# Patient Record
Sex: Male | Born: 1935 | Race: White | Hispanic: No | Smoking: Former smoker
Health system: Southern US, Community
[De-identification: ages and names within clinical notes are randomized; demographics above are authoritative.]

## PROBLEM LIST (undated history)

## (undated) DIAGNOSIS — D7582 Heparin induced thrombocytopenia (HIT): Secondary | ICD-10-CM

## (undated) DIAGNOSIS — K219 Gastro-esophageal reflux disease without esophagitis: Secondary | ICD-10-CM

## (undated) DIAGNOSIS — R55 Syncope and collapse: Secondary | ICD-10-CM

## (undated) DIAGNOSIS — I714 Abdominal aortic aneurysm, without rupture, unspecified: Secondary | ICD-10-CM

## (undated) DIAGNOSIS — E079 Disorder of thyroid, unspecified: Secondary | ICD-10-CM

## (undated) DIAGNOSIS — K649 Unspecified hemorrhoids: Secondary | ICD-10-CM

## (undated) DIAGNOSIS — I6529 Occlusion and stenosis of unspecified carotid artery: Secondary | ICD-10-CM

## (undated) DIAGNOSIS — I2699 Other pulmonary embolism without acute cor pulmonale: Secondary | ICD-10-CM

## (undated) DIAGNOSIS — D649 Anemia, unspecified: Secondary | ICD-10-CM

## (undated) DIAGNOSIS — E119 Type 2 diabetes mellitus without complications: Secondary | ICD-10-CM

## (undated) DIAGNOSIS — D75829 Heparin-induced thrombocytopenia, unspecified: Secondary | ICD-10-CM

## (undated) DIAGNOSIS — I2510 Atherosclerotic heart disease of native coronary artery without angina pectoris: Secondary | ICD-10-CM

## (undated) DIAGNOSIS — Z9289 Personal history of other medical treatment: Secondary | ICD-10-CM

## (undated) DIAGNOSIS — I5189 Other ill-defined heart diseases: Secondary | ICD-10-CM

## (undated) DIAGNOSIS — N183 Chronic kidney disease, stage 3 unspecified: Secondary | ICD-10-CM

## (undated) DIAGNOSIS — E785 Hyperlipidemia, unspecified: Secondary | ICD-10-CM

## (undated) DIAGNOSIS — I10 Essential (primary) hypertension: Secondary | ICD-10-CM

## (undated) HISTORY — DX: Abdominal aortic aneurysm, without rupture: I71.4

## (undated) HISTORY — DX: Other ill-defined heart diseases: I51.89

## (undated) HISTORY — DX: Personal history of other medical treatment: Z92.89

## (undated) HISTORY — DX: Chronic kidney disease, stage 3 (moderate): N18.3

## (undated) HISTORY — DX: Disorder of thyroid, unspecified: E07.9

## (undated) HISTORY — DX: Hyperlipidemia, unspecified: E78.5

## (undated) HISTORY — PX: TONSILLECTOMY AND ADENOIDECTOMY: SHX28

## (undated) HISTORY — DX: Syncope and collapse: R55

## (undated) HISTORY — DX: Heparin induced thrombocytopenia (HIT): D75.82

## (undated) HISTORY — DX: Gastro-esophageal reflux disease without esophagitis: K21.9

## (undated) HISTORY — DX: Essential (primary) hypertension: I10

## (undated) HISTORY — DX: Atherosclerotic heart disease of native coronary artery without angina pectoris: I25.10

## (undated) HISTORY — DX: Unspecified hemorrhoids: K64.9

## (undated) HISTORY — DX: Abdominal aortic aneurysm, without rupture, unspecified: I71.40

## (undated) HISTORY — DX: Other pulmonary embolism without acute cor pulmonale: I26.99

## (undated) HISTORY — DX: Chronic kidney disease, stage 3 unspecified: N18.30

## (undated) HISTORY — DX: Heparin-induced thrombocytopenia, unspecified: D75.829

## (undated) HISTORY — DX: Anemia, unspecified: D64.9

## (undated) HISTORY — DX: Type 2 diabetes mellitus without complications: E11.9

## (undated) HISTORY — DX: Occlusion and stenosis of unspecified carotid artery: I65.29

---

## 1992-03-04 HISTORY — PX: CORONARY ARTERY BYPASS GRAFT: SHX141

## 2002-03-04 HISTORY — PX: CAROTID ENDARTERECTOMY: SUR193

## 2007-03-05 LAB — HM COLONOSCOPY

## 2015-03-08 DIAGNOSIS — N183 Chronic kidney disease, stage 3 (moderate): Secondary | ICD-10-CM | POA: Diagnosis not present

## 2015-03-08 DIAGNOSIS — D631 Anemia in chronic kidney disease: Secondary | ICD-10-CM | POA: Diagnosis not present

## 2015-03-13 DIAGNOSIS — N184 Chronic kidney disease, stage 4 (severe): Secondary | ICD-10-CM | POA: Diagnosis not present

## 2015-03-13 DIAGNOSIS — D631 Anemia in chronic kidney disease: Secondary | ICD-10-CM | POA: Diagnosis not present

## 2015-03-17 DIAGNOSIS — X32XXXA Exposure to sunlight, initial encounter: Secondary | ICD-10-CM | POA: Diagnosis not present

## 2015-03-17 DIAGNOSIS — L570 Actinic keratosis: Secondary | ICD-10-CM | POA: Diagnosis not present

## 2015-03-17 DIAGNOSIS — D692 Other nonthrombocytopenic purpura: Secondary | ICD-10-CM | POA: Diagnosis not present

## 2015-03-17 DIAGNOSIS — L821 Other seborrheic keratosis: Secondary | ICD-10-CM | POA: Diagnosis not present

## 2015-03-23 DIAGNOSIS — D631 Anemia in chronic kidney disease: Secondary | ICD-10-CM | POA: Diagnosis not present

## 2015-03-23 DIAGNOSIS — N183 Chronic kidney disease, stage 3 (moderate): Secondary | ICD-10-CM | POA: Diagnosis not present

## 2015-03-27 DIAGNOSIS — D631 Anemia in chronic kidney disease: Secondary | ICD-10-CM | POA: Diagnosis not present

## 2015-03-27 DIAGNOSIS — N184 Chronic kidney disease, stage 4 (severe): Secondary | ICD-10-CM | POA: Diagnosis not present

## 2015-04-06 DIAGNOSIS — D631 Anemia in chronic kidney disease: Secondary | ICD-10-CM | POA: Diagnosis not present

## 2015-04-06 DIAGNOSIS — N183 Chronic kidney disease, stage 3 (moderate): Secondary | ICD-10-CM | POA: Diagnosis not present

## 2015-04-10 DIAGNOSIS — D631 Anemia in chronic kidney disease: Secondary | ICD-10-CM | POA: Diagnosis not present

## 2015-04-10 DIAGNOSIS — N184 Chronic kidney disease, stage 4 (severe): Secondary | ICD-10-CM | POA: Diagnosis not present

## 2015-04-19 DIAGNOSIS — D631 Anemia in chronic kidney disease: Secondary | ICD-10-CM | POA: Diagnosis not present

## 2015-04-19 DIAGNOSIS — N183 Chronic kidney disease, stage 3 (moderate): Secondary | ICD-10-CM | POA: Diagnosis not present

## 2015-04-24 DIAGNOSIS — N184 Chronic kidney disease, stage 4 (severe): Secondary | ICD-10-CM | POA: Diagnosis not present

## 2015-04-24 DIAGNOSIS — D631 Anemia in chronic kidney disease: Secondary | ICD-10-CM | POA: Diagnosis not present

## 2015-05-03 DIAGNOSIS — D631 Anemia in chronic kidney disease: Secondary | ICD-10-CM | POA: Diagnosis not present

## 2015-05-03 DIAGNOSIS — N183 Chronic kidney disease, stage 3 (moderate): Secondary | ICD-10-CM | POA: Diagnosis not present

## 2015-05-08 DIAGNOSIS — N184 Chronic kidney disease, stage 4 (severe): Secondary | ICD-10-CM | POA: Diagnosis not present

## 2015-05-08 DIAGNOSIS — D631 Anemia in chronic kidney disease: Secondary | ICD-10-CM | POA: Diagnosis not present

## 2015-05-11 DIAGNOSIS — E118 Type 2 diabetes mellitus with unspecified complications: Secondary | ICD-10-CM | POA: Diagnosis not present

## 2015-05-11 DIAGNOSIS — Z6825 Body mass index (BMI) 25.0-25.9, adult: Secondary | ICD-10-CM | POA: Diagnosis not present

## 2015-05-11 DIAGNOSIS — Z0001 Encounter for general adult medical examination with abnormal findings: Secondary | ICD-10-CM | POA: Diagnosis not present

## 2015-05-17 DIAGNOSIS — D631 Anemia in chronic kidney disease: Secondary | ICD-10-CM | POA: Diagnosis not present

## 2015-05-17 DIAGNOSIS — N183 Chronic kidney disease, stage 3 (moderate): Secondary | ICD-10-CM | POA: Diagnosis not present

## 2015-05-18 DIAGNOSIS — D631 Anemia in chronic kidney disease: Secondary | ICD-10-CM | POA: Diagnosis not present

## 2015-05-18 DIAGNOSIS — I10 Essential (primary) hypertension: Secondary | ICD-10-CM | POA: Diagnosis not present

## 2015-05-18 DIAGNOSIS — E118 Type 2 diabetes mellitus with unspecified complications: Secondary | ICD-10-CM | POA: Diagnosis not present

## 2015-05-18 DIAGNOSIS — E039 Hypothyroidism, unspecified: Secondary | ICD-10-CM | POA: Diagnosis not present

## 2015-05-18 DIAGNOSIS — N183 Chronic kidney disease, stage 3 (moderate): Secondary | ICD-10-CM | POA: Diagnosis not present

## 2015-05-22 DIAGNOSIS — N184 Chronic kidney disease, stage 4 (severe): Secondary | ICD-10-CM | POA: Diagnosis not present

## 2015-05-22 DIAGNOSIS — D631 Anemia in chronic kidney disease: Secondary | ICD-10-CM | POA: Diagnosis not present

## 2015-05-31 DIAGNOSIS — D631 Anemia in chronic kidney disease: Secondary | ICD-10-CM | POA: Diagnosis not present

## 2015-05-31 DIAGNOSIS — N183 Chronic kidney disease, stage 3 (moderate): Secondary | ICD-10-CM | POA: Diagnosis not present

## 2015-06-05 DIAGNOSIS — N184 Chronic kidney disease, stage 4 (severe): Secondary | ICD-10-CM | POA: Diagnosis not present

## 2015-06-05 DIAGNOSIS — D631 Anemia in chronic kidney disease: Secondary | ICD-10-CM | POA: Diagnosis not present

## 2015-06-14 DIAGNOSIS — N183 Chronic kidney disease, stage 3 (moderate): Secondary | ICD-10-CM | POA: Diagnosis not present

## 2015-06-14 DIAGNOSIS — D631 Anemia in chronic kidney disease: Secondary | ICD-10-CM | POA: Diagnosis not present

## 2015-06-19 DIAGNOSIS — D631 Anemia in chronic kidney disease: Secondary | ICD-10-CM | POA: Diagnosis not present

## 2015-06-19 DIAGNOSIS — N184 Chronic kidney disease, stage 4 (severe): Secondary | ICD-10-CM | POA: Diagnosis not present

## 2015-06-28 DIAGNOSIS — D631 Anemia in chronic kidney disease: Secondary | ICD-10-CM | POA: Diagnosis not present

## 2015-06-28 DIAGNOSIS — N183 Chronic kidney disease, stage 3 (moderate): Secondary | ICD-10-CM | POA: Diagnosis not present

## 2015-07-12 DIAGNOSIS — N183 Chronic kidney disease, stage 3 (moderate): Secondary | ICD-10-CM | POA: Diagnosis not present

## 2015-07-12 DIAGNOSIS — D631 Anemia in chronic kidney disease: Secondary | ICD-10-CM | POA: Diagnosis not present

## 2015-07-17 DIAGNOSIS — D631 Anemia in chronic kidney disease: Secondary | ICD-10-CM | POA: Diagnosis not present

## 2015-07-17 DIAGNOSIS — N184 Chronic kidney disease, stage 4 (severe): Secondary | ICD-10-CM | POA: Diagnosis not present

## 2015-08-01 DIAGNOSIS — N184 Chronic kidney disease, stage 4 (severe): Secondary | ICD-10-CM | POA: Diagnosis not present

## 2015-08-01 DIAGNOSIS — E118 Type 2 diabetes mellitus with unspecified complications: Secondary | ICD-10-CM | POA: Diagnosis not present

## 2015-08-01 DIAGNOSIS — E875 Hyperkalemia: Secondary | ICD-10-CM | POA: Diagnosis not present

## 2015-08-01 DIAGNOSIS — E785 Hyperlipidemia, unspecified: Secondary | ICD-10-CM | POA: Diagnosis not present

## 2015-08-01 DIAGNOSIS — N2581 Secondary hyperparathyroidism of renal origin: Secondary | ICD-10-CM | POA: Diagnosis not present

## 2015-08-01 DIAGNOSIS — E119 Type 2 diabetes mellitus without complications: Secondary | ICD-10-CM | POA: Diagnosis not present

## 2015-08-01 DIAGNOSIS — N183 Chronic kidney disease, stage 3 (moderate): Secondary | ICD-10-CM | POA: Diagnosis not present

## 2015-08-01 DIAGNOSIS — I10 Essential (primary) hypertension: Secondary | ICD-10-CM | POA: Diagnosis not present

## 2015-08-01 DIAGNOSIS — D631 Anemia in chronic kidney disease: Secondary | ICD-10-CM | POA: Diagnosis not present

## 2015-08-07 DIAGNOSIS — N2581 Secondary hyperparathyroidism of renal origin: Secondary | ICD-10-CM | POA: Diagnosis not present

## 2015-08-07 DIAGNOSIS — N184 Chronic kidney disease, stage 4 (severe): Secondary | ICD-10-CM | POA: Diagnosis not present

## 2015-08-07 DIAGNOSIS — I10 Essential (primary) hypertension: Secondary | ICD-10-CM | POA: Diagnosis not present

## 2015-08-07 DIAGNOSIS — D631 Anemia in chronic kidney disease: Secondary | ICD-10-CM | POA: Diagnosis not present

## 2015-08-16 DIAGNOSIS — N183 Chronic kidney disease, stage 3 (moderate): Secondary | ICD-10-CM | POA: Diagnosis not present

## 2015-08-16 DIAGNOSIS — D631 Anemia in chronic kidney disease: Secondary | ICD-10-CM | POA: Diagnosis not present

## 2015-08-18 DIAGNOSIS — E1169 Type 2 diabetes mellitus with other specified complication: Secondary | ICD-10-CM | POA: Diagnosis not present

## 2015-08-18 DIAGNOSIS — Z6825 Body mass index (BMI) 25.0-25.9, adult: Secondary | ICD-10-CM | POA: Diagnosis not present

## 2015-08-18 DIAGNOSIS — I10 Essential (primary) hypertension: Secondary | ICD-10-CM | POA: Diagnosis not present

## 2015-08-18 DIAGNOSIS — N183 Chronic kidney disease, stage 3 (moderate): Secondary | ICD-10-CM | POA: Diagnosis not present

## 2015-08-21 DIAGNOSIS — D631 Anemia in chronic kidney disease: Secondary | ICD-10-CM | POA: Diagnosis not present

## 2015-08-21 DIAGNOSIS — N184 Chronic kidney disease, stage 4 (severe): Secondary | ICD-10-CM | POA: Diagnosis not present

## 2015-08-29 DIAGNOSIS — N183 Chronic kidney disease, stage 3 (moderate): Secondary | ICD-10-CM | POA: Diagnosis not present

## 2015-08-29 DIAGNOSIS — D631 Anemia in chronic kidney disease: Secondary | ICD-10-CM | POA: Diagnosis not present

## 2015-08-30 DIAGNOSIS — D631 Anemia in chronic kidney disease: Secondary | ICD-10-CM | POA: Diagnosis not present

## 2015-08-30 DIAGNOSIS — N184 Chronic kidney disease, stage 4 (severe): Secondary | ICD-10-CM | POA: Diagnosis not present

## 2015-08-31 DIAGNOSIS — I259 Chronic ischemic heart disease, unspecified: Secondary | ICD-10-CM | POA: Diagnosis not present

## 2015-08-31 DIAGNOSIS — E782 Mixed hyperlipidemia: Secondary | ICD-10-CM | POA: Diagnosis not present

## 2015-08-31 DIAGNOSIS — R0989 Other specified symptoms and signs involving the circulatory and respiratory systems: Secondary | ICD-10-CM | POA: Diagnosis not present

## 2015-09-11 DIAGNOSIS — D631 Anemia in chronic kidney disease: Secondary | ICD-10-CM | POA: Diagnosis not present

## 2015-09-11 DIAGNOSIS — N183 Chronic kidney disease, stage 3 (moderate): Secondary | ICD-10-CM | POA: Diagnosis not present

## 2015-09-13 DIAGNOSIS — N184 Chronic kidney disease, stage 4 (severe): Secondary | ICD-10-CM | POA: Diagnosis not present

## 2015-09-13 DIAGNOSIS — D631 Anemia in chronic kidney disease: Secondary | ICD-10-CM | POA: Diagnosis not present

## 2015-09-14 DIAGNOSIS — I270 Primary pulmonary hypertension: Secondary | ICD-10-CM | POA: Diagnosis not present

## 2015-09-14 DIAGNOSIS — R0989 Other specified symptoms and signs involving the circulatory and respiratory systems: Secondary | ICD-10-CM | POA: Diagnosis not present

## 2015-09-14 DIAGNOSIS — I255 Ischemic cardiomyopathy: Secondary | ICD-10-CM | POA: Diagnosis not present

## 2015-09-20 DIAGNOSIS — D631 Anemia in chronic kidney disease: Secondary | ICD-10-CM | POA: Diagnosis not present

## 2015-09-20 DIAGNOSIS — N183 Chronic kidney disease, stage 3 (moderate): Secondary | ICD-10-CM | POA: Diagnosis not present

## 2015-09-25 DIAGNOSIS — N184 Chronic kidney disease, stage 4 (severe): Secondary | ICD-10-CM | POA: Diagnosis not present

## 2015-09-25 DIAGNOSIS — D631 Anemia in chronic kidney disease: Secondary | ICD-10-CM | POA: Diagnosis not present

## 2015-10-05 DIAGNOSIS — N183 Chronic kidney disease, stage 3 (moderate): Secondary | ICD-10-CM | POA: Diagnosis not present

## 2015-10-05 DIAGNOSIS — D631 Anemia in chronic kidney disease: Secondary | ICD-10-CM | POA: Diagnosis not present

## 2015-10-10 DIAGNOSIS — D631 Anemia in chronic kidney disease: Secondary | ICD-10-CM | POA: Diagnosis not present

## 2015-10-10 DIAGNOSIS — N184 Chronic kidney disease, stage 4 (severe): Secondary | ICD-10-CM | POA: Diagnosis not present

## 2015-10-18 DIAGNOSIS — N183 Chronic kidney disease, stage 3 (moderate): Secondary | ICD-10-CM | POA: Diagnosis not present

## 2015-10-18 DIAGNOSIS — D631 Anemia in chronic kidney disease: Secondary | ICD-10-CM | POA: Diagnosis not present

## 2015-10-24 DIAGNOSIS — N184 Chronic kidney disease, stage 4 (severe): Secondary | ICD-10-CM | POA: Diagnosis not present

## 2015-10-24 DIAGNOSIS — D631 Anemia in chronic kidney disease: Secondary | ICD-10-CM | POA: Diagnosis not present

## 2015-11-09 DIAGNOSIS — D631 Anemia in chronic kidney disease: Secondary | ICD-10-CM | POA: Diagnosis not present

## 2015-11-09 DIAGNOSIS — N183 Chronic kidney disease, stage 3 (moderate): Secondary | ICD-10-CM | POA: Diagnosis not present

## 2015-11-21 DIAGNOSIS — Z6826 Body mass index (BMI) 26.0-26.9, adult: Secondary | ICD-10-CM | POA: Diagnosis not present

## 2015-11-21 DIAGNOSIS — E1169 Type 2 diabetes mellitus with other specified complication: Secondary | ICD-10-CM | POA: Diagnosis not present

## 2015-11-21 DIAGNOSIS — D631 Anemia in chronic kidney disease: Secondary | ICD-10-CM | POA: Diagnosis not present

## 2015-11-21 DIAGNOSIS — I714 Abdominal aortic aneurysm, without rupture: Secondary | ICD-10-CM | POA: Diagnosis not present

## 2015-11-21 DIAGNOSIS — Z23 Encounter for immunization: Secondary | ICD-10-CM | POA: Diagnosis not present

## 2015-11-21 DIAGNOSIS — N183 Chronic kidney disease, stage 3 (moderate): Secondary | ICD-10-CM | POA: Diagnosis not present

## 2015-11-23 DIAGNOSIS — D631 Anemia in chronic kidney disease: Secondary | ICD-10-CM | POA: Diagnosis not present

## 2015-11-23 DIAGNOSIS — N183 Chronic kidney disease, stage 3 (moderate): Secondary | ICD-10-CM | POA: Diagnosis not present

## 2015-11-28 DIAGNOSIS — D631 Anemia in chronic kidney disease: Secondary | ICD-10-CM | POA: Diagnosis not present

## 2015-11-28 DIAGNOSIS — N183 Chronic kidney disease, stage 3 (moderate): Secondary | ICD-10-CM | POA: Diagnosis not present

## 2015-11-30 DIAGNOSIS — E119 Type 2 diabetes mellitus without complications: Secondary | ICD-10-CM | POA: Diagnosis not present

## 2015-11-30 DIAGNOSIS — N2581 Secondary hyperparathyroidism of renal origin: Secondary | ICD-10-CM | POA: Diagnosis not present

## 2015-11-30 DIAGNOSIS — N183 Chronic kidney disease, stage 3 (moderate): Secondary | ICD-10-CM | POA: Diagnosis not present

## 2015-11-30 DIAGNOSIS — I10 Essential (primary) hypertension: Secondary | ICD-10-CM | POA: Diagnosis not present

## 2015-12-04 DIAGNOSIS — Z961 Presence of intraocular lens: Secondary | ICD-10-CM | POA: Diagnosis not present

## 2015-12-04 DIAGNOSIS — H26493 Other secondary cataract, bilateral: Secondary | ICD-10-CM | POA: Diagnosis not present

## 2015-12-04 DIAGNOSIS — I10 Essential (primary) hypertension: Secondary | ICD-10-CM | POA: Diagnosis not present

## 2015-12-04 DIAGNOSIS — D631 Anemia in chronic kidney disease: Secondary | ICD-10-CM | POA: Diagnosis not present

## 2015-12-04 DIAGNOSIS — H40052 Ocular hypertension, left eye: Secondary | ICD-10-CM | POA: Diagnosis not present

## 2015-12-04 DIAGNOSIS — E119 Type 2 diabetes mellitus without complications: Secondary | ICD-10-CM | POA: Diagnosis not present

## 2015-12-04 DIAGNOSIS — N2581 Secondary hyperparathyroidism of renal origin: Secondary | ICD-10-CM | POA: Diagnosis not present

## 2015-12-04 DIAGNOSIS — N184 Chronic kidney disease, stage 4 (severe): Secondary | ICD-10-CM | POA: Diagnosis not present

## 2015-12-12 DIAGNOSIS — N184 Chronic kidney disease, stage 4 (severe): Secondary | ICD-10-CM | POA: Diagnosis not present

## 2015-12-12 DIAGNOSIS — D631 Anemia in chronic kidney disease: Secondary | ICD-10-CM | POA: Diagnosis not present

## 2015-12-20 DIAGNOSIS — D631 Anemia in chronic kidney disease: Secondary | ICD-10-CM | POA: Diagnosis not present

## 2015-12-20 DIAGNOSIS — N183 Chronic kidney disease, stage 3 (moderate): Secondary | ICD-10-CM | POA: Diagnosis not present

## 2015-12-21 DIAGNOSIS — H40052 Ocular hypertension, left eye: Secondary | ICD-10-CM | POA: Diagnosis not present

## 2015-12-27 DIAGNOSIS — N183 Chronic kidney disease, stage 3 (moderate): Secondary | ICD-10-CM | POA: Diagnosis not present

## 2015-12-27 DIAGNOSIS — D631 Anemia in chronic kidney disease: Secondary | ICD-10-CM | POA: Diagnosis not present

## 2016-01-03 DIAGNOSIS — N183 Chronic kidney disease, stage 3 (moderate): Secondary | ICD-10-CM | POA: Diagnosis not present

## 2016-01-03 DIAGNOSIS — D631 Anemia in chronic kidney disease: Secondary | ICD-10-CM | POA: Diagnosis not present

## 2016-01-04 DIAGNOSIS — H903 Sensorineural hearing loss, bilateral: Secondary | ICD-10-CM | POA: Diagnosis not present

## 2016-01-08 DIAGNOSIS — Z6826 Body mass index (BMI) 26.0-26.9, adult: Secondary | ICD-10-CM | POA: Diagnosis not present

## 2016-01-08 DIAGNOSIS — E039 Hypothyroidism, unspecified: Secondary | ICD-10-CM | POA: Diagnosis not present

## 2016-01-08 DIAGNOSIS — I2510 Atherosclerotic heart disease of native coronary artery without angina pectoris: Secondary | ICD-10-CM | POA: Diagnosis not present

## 2016-01-08 DIAGNOSIS — M25562 Pain in left knee: Secondary | ICD-10-CM | POA: Diagnosis not present

## 2016-01-08 DIAGNOSIS — E1169 Type 2 diabetes mellitus with other specified complication: Secondary | ICD-10-CM | POA: Diagnosis not present

## 2016-01-10 DIAGNOSIS — N183 Chronic kidney disease, stage 3 (moderate): Secondary | ICD-10-CM | POA: Diagnosis not present

## 2016-01-10 DIAGNOSIS — D631 Anemia in chronic kidney disease: Secondary | ICD-10-CM | POA: Diagnosis not present

## 2016-02-02 DIAGNOSIS — D631 Anemia in chronic kidney disease: Secondary | ICD-10-CM | POA: Diagnosis not present

## 2016-02-02 DIAGNOSIS — N183 Chronic kidney disease, stage 3 (moderate): Secondary | ICD-10-CM | POA: Diagnosis not present

## 2016-02-06 DIAGNOSIS — N183 Chronic kidney disease, stage 3 (moderate): Secondary | ICD-10-CM | POA: Diagnosis not present

## 2016-02-06 DIAGNOSIS — D631 Anemia in chronic kidney disease: Secondary | ICD-10-CM | POA: Diagnosis not present

## 2016-02-12 DIAGNOSIS — Z86711 Personal history of pulmonary embolism: Secondary | ICD-10-CM | POA: Diagnosis not present

## 2016-02-12 DIAGNOSIS — R0989 Other specified symptoms and signs involving the circulatory and respiratory systems: Secondary | ICD-10-CM | POA: Diagnosis not present

## 2016-02-12 DIAGNOSIS — N2581 Secondary hyperparathyroidism of renal origin: Secondary | ICD-10-CM | POA: Diagnosis not present

## 2016-02-12 DIAGNOSIS — I2510 Atherosclerotic heart disease of native coronary artery without angina pectoris: Secondary | ICD-10-CM | POA: Diagnosis not present

## 2016-02-12 DIAGNOSIS — Z888 Allergy status to other drugs, medicaments and biological substances status: Secondary | ICD-10-CM | POA: Diagnosis not present

## 2016-02-12 DIAGNOSIS — N179 Acute kidney failure, unspecified: Secondary | ICD-10-CM | POA: Diagnosis not present

## 2016-02-12 DIAGNOSIS — N183 Chronic kidney disease, stage 3 (moderate): Secondary | ICD-10-CM | POA: Diagnosis not present

## 2016-02-12 DIAGNOSIS — I82411 Acute embolism and thrombosis of right femoral vein: Secondary | ICD-10-CM | POA: Diagnosis not present

## 2016-02-12 DIAGNOSIS — E782 Mixed hyperlipidemia: Secondary | ICD-10-CM | POA: Diagnosis not present

## 2016-02-12 DIAGNOSIS — Z951 Presence of aortocoronary bypass graft: Secondary | ICD-10-CM | POA: Diagnosis not present

## 2016-02-12 DIAGNOSIS — Z87891 Personal history of nicotine dependence: Secondary | ICD-10-CM | POA: Diagnosis not present

## 2016-02-12 DIAGNOSIS — I1310 Hypertensive heart and chronic kidney disease without heart failure, with stage 1 through stage 4 chronic kidney disease, or unspecified chronic kidney disease: Secondary | ICD-10-CM | POA: Diagnosis not present

## 2016-02-12 DIAGNOSIS — I509 Heart failure, unspecified: Secondary | ICD-10-CM | POA: Diagnosis not present

## 2016-02-12 DIAGNOSIS — Z0184 Encounter for antibody response examination: Secondary | ICD-10-CM | POA: Diagnosis not present

## 2016-02-12 DIAGNOSIS — D649 Anemia, unspecified: Secondary | ICD-10-CM | POA: Diagnosis not present

## 2016-02-12 DIAGNOSIS — E1122 Type 2 diabetes mellitus with diabetic chronic kidney disease: Secondary | ICD-10-CM | POA: Diagnosis not present

## 2016-02-12 DIAGNOSIS — E785 Hyperlipidemia, unspecified: Secondary | ICD-10-CM | POA: Diagnosis present

## 2016-02-12 DIAGNOSIS — E8770 Fluid overload, unspecified: Secondary | ICD-10-CM | POA: Diagnosis not present

## 2016-02-12 DIAGNOSIS — I259 Chronic ischemic heart disease, unspecified: Secondary | ICD-10-CM | POA: Diagnosis not present

## 2016-02-12 DIAGNOSIS — N189 Chronic kidney disease, unspecified: Secondary | ICD-10-CM | POA: Diagnosis not present

## 2016-02-12 DIAGNOSIS — I214 Non-ST elevation (NSTEMI) myocardial infarction: Secondary | ICD-10-CM | POA: Diagnosis not present

## 2016-02-12 DIAGNOSIS — R079 Chest pain, unspecified: Secondary | ICD-10-CM | POA: Diagnosis not present

## 2016-02-12 DIAGNOSIS — D631 Anemia in chronic kidney disease: Secondary | ICD-10-CM | POA: Diagnosis not present

## 2016-02-19 DIAGNOSIS — I25709 Atherosclerosis of coronary artery bypass graft(s), unspecified, with unspecified angina pectoris: Secondary | ICD-10-CM | POA: Diagnosis not present

## 2016-02-19 DIAGNOSIS — E1169 Type 2 diabetes mellitus with other specified complication: Secondary | ICD-10-CM | POA: Diagnosis not present

## 2016-02-19 DIAGNOSIS — E039 Hypothyroidism, unspecified: Secondary | ICD-10-CM | POA: Diagnosis not present

## 2016-02-19 DIAGNOSIS — I10 Essential (primary) hypertension: Secondary | ICD-10-CM | POA: Diagnosis not present

## 2016-02-20 DIAGNOSIS — I10 Essential (primary) hypertension: Secondary | ICD-10-CM | POA: Diagnosis not present

## 2016-02-20 DIAGNOSIS — N183 Chronic kidney disease, stage 3 (moderate): Secondary | ICD-10-CM | POA: Diagnosis not present

## 2016-02-20 DIAGNOSIS — N2581 Secondary hyperparathyroidism of renal origin: Secondary | ICD-10-CM | POA: Diagnosis not present

## 2016-02-20 DIAGNOSIS — D631 Anemia in chronic kidney disease: Secondary | ICD-10-CM | POA: Diagnosis not present

## 2016-02-20 DIAGNOSIS — N184 Chronic kidney disease, stage 4 (severe): Secondary | ICD-10-CM | POA: Diagnosis not present

## 2016-03-08 DIAGNOSIS — R079 Chest pain, unspecified: Secondary | ICD-10-CM | POA: Diagnosis not present

## 2016-03-08 DIAGNOSIS — R5383 Other fatigue: Secondary | ICD-10-CM | POA: Diagnosis not present

## 2016-03-08 DIAGNOSIS — I259 Chronic ischemic heart disease, unspecified: Secondary | ICD-10-CM | POA: Diagnosis not present

## 2016-03-08 DIAGNOSIS — I25118 Atherosclerotic heart disease of native coronary artery with other forms of angina pectoris: Secondary | ICD-10-CM | POA: Diagnosis not present

## 2016-03-08 DIAGNOSIS — R0609 Other forms of dyspnea: Secondary | ICD-10-CM | POA: Diagnosis not present

## 2016-03-08 DIAGNOSIS — I209 Angina pectoris, unspecified: Secondary | ICD-10-CM | POA: Diagnosis not present

## 2016-03-08 DIAGNOSIS — I2582 Chronic total occlusion of coronary artery: Secondary | ICD-10-CM | POA: Diagnosis not present

## 2016-03-08 DIAGNOSIS — E782 Mixed hyperlipidemia: Secondary | ICD-10-CM | POA: Diagnosis not present

## 2016-03-08 DIAGNOSIS — I214 Non-ST elevation (NSTEMI) myocardial infarction: Secondary | ICD-10-CM | POA: Diagnosis not present

## 2016-03-15 DIAGNOSIS — L570 Actinic keratosis: Secondary | ICD-10-CM | POA: Diagnosis not present

## 2016-03-15 DIAGNOSIS — X32XXXA Exposure to sunlight, initial encounter: Secondary | ICD-10-CM | POA: Diagnosis not present

## 2016-03-19 DIAGNOSIS — N189 Chronic kidney disease, unspecified: Secondary | ICD-10-CM | POA: Diagnosis not present

## 2016-03-19 DIAGNOSIS — N183 Chronic kidney disease, stage 3 (moderate): Secondary | ICD-10-CM | POA: Diagnosis not present

## 2016-03-19 DIAGNOSIS — R609 Edema, unspecified: Secondary | ICD-10-CM | POA: Diagnosis not present

## 2016-03-19 DIAGNOSIS — I259 Chronic ischemic heart disease, unspecified: Secondary | ICD-10-CM | POA: Diagnosis not present

## 2016-03-19 DIAGNOSIS — I82401 Acute embolism and thrombosis of unspecified deep veins of right lower extremity: Secondary | ICD-10-CM | POA: Diagnosis not present

## 2016-03-19 DIAGNOSIS — E782 Mixed hyperlipidemia: Secondary | ICD-10-CM | POA: Diagnosis not present

## 2016-03-20 DIAGNOSIS — E1169 Type 2 diabetes mellitus with other specified complication: Secondary | ICD-10-CM | POA: Diagnosis not present

## 2016-03-20 DIAGNOSIS — D631 Anemia in chronic kidney disease: Secondary | ICD-10-CM | POA: Diagnosis not present

## 2016-03-20 DIAGNOSIS — Z951 Presence of aortocoronary bypass graft: Secondary | ICD-10-CM | POA: Diagnosis not present

## 2016-03-20 DIAGNOSIS — N183 Chronic kidney disease, stage 3 (moderate): Secondary | ICD-10-CM | POA: Diagnosis not present

## 2016-03-21 DIAGNOSIS — R079 Chest pain, unspecified: Secondary | ICD-10-CM | POA: Diagnosis not present

## 2016-03-21 DIAGNOSIS — R5383 Other fatigue: Secondary | ICD-10-CM | POA: Diagnosis not present

## 2016-03-21 DIAGNOSIS — I209 Angina pectoris, unspecified: Secondary | ICD-10-CM | POA: Diagnosis not present

## 2016-03-21 DIAGNOSIS — I2582 Chronic total occlusion of coronary artery: Secondary | ICD-10-CM | POA: Diagnosis not present

## 2016-03-21 DIAGNOSIS — I25118 Atherosclerotic heart disease of native coronary artery with other forms of angina pectoris: Secondary | ICD-10-CM | POA: Diagnosis not present

## 2016-03-21 DIAGNOSIS — I214 Non-ST elevation (NSTEMI) myocardial infarction: Secondary | ICD-10-CM | POA: Diagnosis not present

## 2016-03-21 DIAGNOSIS — R0609 Other forms of dyspnea: Secondary | ICD-10-CM | POA: Diagnosis not present

## 2016-03-25 DIAGNOSIS — I25118 Atherosclerotic heart disease of native coronary artery with other forms of angina pectoris: Secondary | ICD-10-CM | POA: Diagnosis not present

## 2016-03-25 DIAGNOSIS — R0609 Other forms of dyspnea: Secondary | ICD-10-CM | POA: Diagnosis not present

## 2016-03-25 DIAGNOSIS — I214 Non-ST elevation (NSTEMI) myocardial infarction: Secondary | ICD-10-CM | POA: Diagnosis not present

## 2016-03-25 DIAGNOSIS — R5383 Other fatigue: Secondary | ICD-10-CM | POA: Diagnosis not present

## 2016-03-25 DIAGNOSIS — R079 Chest pain, unspecified: Secondary | ICD-10-CM | POA: Diagnosis not present

## 2016-03-25 DIAGNOSIS — I2582 Chronic total occlusion of coronary artery: Secondary | ICD-10-CM | POA: Diagnosis not present

## 2016-03-25 DIAGNOSIS — I209 Angina pectoris, unspecified: Secondary | ICD-10-CM | POA: Diagnosis not present

## 2016-03-26 DIAGNOSIS — I2582 Chronic total occlusion of coronary artery: Secondary | ICD-10-CM | POA: Diagnosis not present

## 2016-03-26 DIAGNOSIS — I25118 Atherosclerotic heart disease of native coronary artery with other forms of angina pectoris: Secondary | ICD-10-CM | POA: Diagnosis not present

## 2016-03-26 DIAGNOSIS — I214 Non-ST elevation (NSTEMI) myocardial infarction: Secondary | ICD-10-CM | POA: Diagnosis not present

## 2016-03-26 DIAGNOSIS — R5383 Other fatigue: Secondary | ICD-10-CM | POA: Diagnosis not present

## 2016-03-26 DIAGNOSIS — R079 Chest pain, unspecified: Secondary | ICD-10-CM | POA: Diagnosis not present

## 2016-03-26 DIAGNOSIS — R0609 Other forms of dyspnea: Secondary | ICD-10-CM | POA: Diagnosis not present

## 2016-03-26 DIAGNOSIS — I209 Angina pectoris, unspecified: Secondary | ICD-10-CM | POA: Diagnosis not present

## 2016-03-27 DIAGNOSIS — I214 Non-ST elevation (NSTEMI) myocardial infarction: Secondary | ICD-10-CM | POA: Diagnosis not present

## 2016-03-27 DIAGNOSIS — I2582 Chronic total occlusion of coronary artery: Secondary | ICD-10-CM | POA: Diagnosis not present

## 2016-03-27 DIAGNOSIS — R5383 Other fatigue: Secondary | ICD-10-CM | POA: Diagnosis not present

## 2016-03-27 DIAGNOSIS — R0609 Other forms of dyspnea: Secondary | ICD-10-CM | POA: Diagnosis not present

## 2016-03-27 DIAGNOSIS — I209 Angina pectoris, unspecified: Secondary | ICD-10-CM | POA: Diagnosis not present

## 2016-03-27 DIAGNOSIS — I25118 Atherosclerotic heart disease of native coronary artery with other forms of angina pectoris: Secondary | ICD-10-CM | POA: Diagnosis not present

## 2016-03-27 DIAGNOSIS — R079 Chest pain, unspecified: Secondary | ICD-10-CM | POA: Diagnosis not present

## 2016-03-28 DIAGNOSIS — E1169 Type 2 diabetes mellitus with other specified complication: Secondary | ICD-10-CM | POA: Diagnosis not present

## 2016-03-28 DIAGNOSIS — E119 Type 2 diabetes mellitus without complications: Secondary | ICD-10-CM | POA: Diagnosis not present

## 2016-03-28 DIAGNOSIS — D631 Anemia in chronic kidney disease: Secondary | ICD-10-CM | POA: Diagnosis not present

## 2016-03-28 DIAGNOSIS — I25118 Atherosclerotic heart disease of native coronary artery with other forms of angina pectoris: Secondary | ICD-10-CM | POA: Diagnosis not present

## 2016-03-28 DIAGNOSIS — I214 Non-ST elevation (NSTEMI) myocardial infarction: Secondary | ICD-10-CM | POA: Diagnosis not present

## 2016-03-28 DIAGNOSIS — R079 Chest pain, unspecified: Secondary | ICD-10-CM | POA: Diagnosis not present

## 2016-03-28 DIAGNOSIS — I10 Essential (primary) hypertension: Secondary | ICD-10-CM | POA: Diagnosis not present

## 2016-03-28 DIAGNOSIS — I209 Angina pectoris, unspecified: Secondary | ICD-10-CM | POA: Diagnosis not present

## 2016-03-28 DIAGNOSIS — N183 Chronic kidney disease, stage 3 (moderate): Secondary | ICD-10-CM | POA: Diagnosis not present

## 2016-03-28 DIAGNOSIS — N2581 Secondary hyperparathyroidism of renal origin: Secondary | ICD-10-CM | POA: Diagnosis not present

## 2016-03-28 DIAGNOSIS — I2582 Chronic total occlusion of coronary artery: Secondary | ICD-10-CM | POA: Diagnosis not present

## 2016-03-28 DIAGNOSIS — R0609 Other forms of dyspnea: Secondary | ICD-10-CM | POA: Diagnosis not present

## 2016-03-28 DIAGNOSIS — R5383 Other fatigue: Secondary | ICD-10-CM | POA: Diagnosis not present

## 2016-03-28 DIAGNOSIS — E785 Hyperlipidemia, unspecified: Secondary | ICD-10-CM | POA: Diagnosis not present

## 2016-03-28 DIAGNOSIS — E875 Hyperkalemia: Secondary | ICD-10-CM | POA: Diagnosis not present

## 2016-03-29 DIAGNOSIS — I25118 Atherosclerotic heart disease of native coronary artery with other forms of angina pectoris: Secondary | ICD-10-CM | POA: Diagnosis not present

## 2016-03-29 DIAGNOSIS — R079 Chest pain, unspecified: Secondary | ICD-10-CM | POA: Diagnosis not present

## 2016-03-29 DIAGNOSIS — R0609 Other forms of dyspnea: Secondary | ICD-10-CM | POA: Diagnosis not present

## 2016-03-29 DIAGNOSIS — I209 Angina pectoris, unspecified: Secondary | ICD-10-CM | POA: Diagnosis not present

## 2016-03-29 DIAGNOSIS — I2582 Chronic total occlusion of coronary artery: Secondary | ICD-10-CM | POA: Diagnosis not present

## 2016-03-29 DIAGNOSIS — R5383 Other fatigue: Secondary | ICD-10-CM | POA: Diagnosis not present

## 2016-03-29 DIAGNOSIS — I214 Non-ST elevation (NSTEMI) myocardial infarction: Secondary | ICD-10-CM | POA: Diagnosis not present

## 2016-04-02 DIAGNOSIS — I2582 Chronic total occlusion of coronary artery: Secondary | ICD-10-CM | POA: Diagnosis not present

## 2016-04-02 DIAGNOSIS — I10 Essential (primary) hypertension: Secondary | ICD-10-CM | POA: Diagnosis not present

## 2016-04-02 DIAGNOSIS — R079 Chest pain, unspecified: Secondary | ICD-10-CM | POA: Diagnosis not present

## 2016-04-02 DIAGNOSIS — R5383 Other fatigue: Secondary | ICD-10-CM | POA: Diagnosis not present

## 2016-04-02 DIAGNOSIS — N2581 Secondary hyperparathyroidism of renal origin: Secondary | ICD-10-CM | POA: Diagnosis not present

## 2016-04-02 DIAGNOSIS — I214 Non-ST elevation (NSTEMI) myocardial infarction: Secondary | ICD-10-CM | POA: Diagnosis not present

## 2016-04-02 DIAGNOSIS — I25118 Atherosclerotic heart disease of native coronary artery with other forms of angina pectoris: Secondary | ICD-10-CM | POA: Diagnosis not present

## 2016-04-02 DIAGNOSIS — N184 Chronic kidney disease, stage 4 (severe): Secondary | ICD-10-CM | POA: Diagnosis not present

## 2016-04-02 DIAGNOSIS — D631 Anemia in chronic kidney disease: Secondary | ICD-10-CM | POA: Diagnosis not present

## 2016-04-02 DIAGNOSIS — I209 Angina pectoris, unspecified: Secondary | ICD-10-CM | POA: Diagnosis not present

## 2016-04-02 DIAGNOSIS — R0609 Other forms of dyspnea: Secondary | ICD-10-CM | POA: Diagnosis not present

## 2016-04-03 DIAGNOSIS — I25118 Atherosclerotic heart disease of native coronary artery with other forms of angina pectoris: Secondary | ICD-10-CM | POA: Diagnosis not present

## 2016-04-03 DIAGNOSIS — I2582 Chronic total occlusion of coronary artery: Secondary | ICD-10-CM | POA: Diagnosis not present

## 2016-04-03 DIAGNOSIS — R0609 Other forms of dyspnea: Secondary | ICD-10-CM | POA: Diagnosis not present

## 2016-04-03 DIAGNOSIS — R079 Chest pain, unspecified: Secondary | ICD-10-CM | POA: Diagnosis not present

## 2016-04-03 DIAGNOSIS — I214 Non-ST elevation (NSTEMI) myocardial infarction: Secondary | ICD-10-CM | POA: Diagnosis not present

## 2016-04-03 DIAGNOSIS — R5383 Other fatigue: Secondary | ICD-10-CM | POA: Diagnosis not present

## 2016-04-03 DIAGNOSIS — I209 Angina pectoris, unspecified: Secondary | ICD-10-CM | POA: Diagnosis not present

## 2016-04-04 DIAGNOSIS — I25118 Atherosclerotic heart disease of native coronary artery with other forms of angina pectoris: Secondary | ICD-10-CM | POA: Diagnosis not present

## 2016-04-04 DIAGNOSIS — R0609 Other forms of dyspnea: Secondary | ICD-10-CM | POA: Diagnosis not present

## 2016-04-04 DIAGNOSIS — I214 Non-ST elevation (NSTEMI) myocardial infarction: Secondary | ICD-10-CM | POA: Diagnosis not present

## 2016-04-04 DIAGNOSIS — I2582 Chronic total occlusion of coronary artery: Secondary | ICD-10-CM | POA: Diagnosis not present

## 2016-04-04 DIAGNOSIS — R5383 Other fatigue: Secondary | ICD-10-CM | POA: Diagnosis not present

## 2016-04-04 DIAGNOSIS — R079 Chest pain, unspecified: Secondary | ICD-10-CM | POA: Diagnosis not present

## 2016-04-04 DIAGNOSIS — I209 Angina pectoris, unspecified: Secondary | ICD-10-CM | POA: Diagnosis not present

## 2016-04-05 DIAGNOSIS — I214 Non-ST elevation (NSTEMI) myocardial infarction: Secondary | ICD-10-CM | POA: Diagnosis not present

## 2016-04-05 DIAGNOSIS — R079 Chest pain, unspecified: Secondary | ICD-10-CM | POA: Diagnosis not present

## 2016-04-05 DIAGNOSIS — I25118 Atherosclerotic heart disease of native coronary artery with other forms of angina pectoris: Secondary | ICD-10-CM | POA: Diagnosis not present

## 2016-04-05 DIAGNOSIS — R5383 Other fatigue: Secondary | ICD-10-CM | POA: Diagnosis not present

## 2016-04-05 DIAGNOSIS — I209 Angina pectoris, unspecified: Secondary | ICD-10-CM | POA: Diagnosis not present

## 2016-04-05 DIAGNOSIS — R0609 Other forms of dyspnea: Secondary | ICD-10-CM | POA: Diagnosis not present

## 2016-04-05 DIAGNOSIS — I2582 Chronic total occlusion of coronary artery: Secondary | ICD-10-CM | POA: Diagnosis not present

## 2016-04-08 DIAGNOSIS — R0609 Other forms of dyspnea: Secondary | ICD-10-CM | POA: Diagnosis not present

## 2016-04-08 DIAGNOSIS — I209 Angina pectoris, unspecified: Secondary | ICD-10-CM | POA: Diagnosis not present

## 2016-04-08 DIAGNOSIS — R5383 Other fatigue: Secondary | ICD-10-CM | POA: Diagnosis not present

## 2016-04-08 DIAGNOSIS — I2582 Chronic total occlusion of coronary artery: Secondary | ICD-10-CM | POA: Diagnosis not present

## 2016-04-08 DIAGNOSIS — R079 Chest pain, unspecified: Secondary | ICD-10-CM | POA: Diagnosis not present

## 2016-04-08 DIAGNOSIS — I25118 Atherosclerotic heart disease of native coronary artery with other forms of angina pectoris: Secondary | ICD-10-CM | POA: Diagnosis not present

## 2016-04-08 DIAGNOSIS — I214 Non-ST elevation (NSTEMI) myocardial infarction: Secondary | ICD-10-CM | POA: Diagnosis not present

## 2016-04-09 DIAGNOSIS — I25118 Atherosclerotic heart disease of native coronary artery with other forms of angina pectoris: Secondary | ICD-10-CM | POA: Diagnosis not present

## 2016-04-09 DIAGNOSIS — R079 Chest pain, unspecified: Secondary | ICD-10-CM | POA: Diagnosis not present

## 2016-04-09 DIAGNOSIS — I214 Non-ST elevation (NSTEMI) myocardial infarction: Secondary | ICD-10-CM | POA: Diagnosis not present

## 2016-04-09 DIAGNOSIS — I2582 Chronic total occlusion of coronary artery: Secondary | ICD-10-CM | POA: Diagnosis not present

## 2016-04-09 DIAGNOSIS — I209 Angina pectoris, unspecified: Secondary | ICD-10-CM | POA: Diagnosis not present

## 2016-04-09 DIAGNOSIS — R0609 Other forms of dyspnea: Secondary | ICD-10-CM | POA: Diagnosis not present

## 2016-04-09 DIAGNOSIS — R5383 Other fatigue: Secondary | ICD-10-CM | POA: Diagnosis not present

## 2016-04-10 DIAGNOSIS — I209 Angina pectoris, unspecified: Secondary | ICD-10-CM | POA: Diagnosis not present

## 2016-04-10 DIAGNOSIS — N183 Chronic kidney disease, stage 3 (moderate): Secondary | ICD-10-CM | POA: Diagnosis not present

## 2016-04-10 DIAGNOSIS — I2582 Chronic total occlusion of coronary artery: Secondary | ICD-10-CM | POA: Diagnosis not present

## 2016-04-10 DIAGNOSIS — I25118 Atherosclerotic heart disease of native coronary artery with other forms of angina pectoris: Secondary | ICD-10-CM | POA: Diagnosis not present

## 2016-04-10 DIAGNOSIS — R079 Chest pain, unspecified: Secondary | ICD-10-CM | POA: Diagnosis not present

## 2016-04-10 DIAGNOSIS — R0609 Other forms of dyspnea: Secondary | ICD-10-CM | POA: Diagnosis not present

## 2016-04-10 DIAGNOSIS — D631 Anemia in chronic kidney disease: Secondary | ICD-10-CM | POA: Diagnosis not present

## 2016-04-10 DIAGNOSIS — I214 Non-ST elevation (NSTEMI) myocardial infarction: Secondary | ICD-10-CM | POA: Diagnosis not present

## 2016-04-10 DIAGNOSIS — R5383 Other fatigue: Secondary | ICD-10-CM | POA: Diagnosis not present

## 2016-04-11 DIAGNOSIS — I25118 Atherosclerotic heart disease of native coronary artery with other forms of angina pectoris: Secondary | ICD-10-CM | POA: Diagnosis not present

## 2016-04-11 DIAGNOSIS — R079 Chest pain, unspecified: Secondary | ICD-10-CM | POA: Diagnosis not present

## 2016-04-11 DIAGNOSIS — I209 Angina pectoris, unspecified: Secondary | ICD-10-CM | POA: Diagnosis not present

## 2016-04-11 DIAGNOSIS — I214 Non-ST elevation (NSTEMI) myocardial infarction: Secondary | ICD-10-CM | POA: Diagnosis not present

## 2016-04-11 DIAGNOSIS — R0609 Other forms of dyspnea: Secondary | ICD-10-CM | POA: Diagnosis not present

## 2016-04-11 DIAGNOSIS — I2582 Chronic total occlusion of coronary artery: Secondary | ICD-10-CM | POA: Diagnosis not present

## 2016-04-11 DIAGNOSIS — R5383 Other fatigue: Secondary | ICD-10-CM | POA: Diagnosis not present

## 2016-04-12 DIAGNOSIS — I209 Angina pectoris, unspecified: Secondary | ICD-10-CM | POA: Diagnosis not present

## 2016-04-12 DIAGNOSIS — R0609 Other forms of dyspnea: Secondary | ICD-10-CM | POA: Diagnosis not present

## 2016-04-12 DIAGNOSIS — R5383 Other fatigue: Secondary | ICD-10-CM | POA: Diagnosis not present

## 2016-04-12 DIAGNOSIS — I214 Non-ST elevation (NSTEMI) myocardial infarction: Secondary | ICD-10-CM | POA: Diagnosis not present

## 2016-04-12 DIAGNOSIS — I2582 Chronic total occlusion of coronary artery: Secondary | ICD-10-CM | POA: Diagnosis not present

## 2016-04-12 DIAGNOSIS — R079 Chest pain, unspecified: Secondary | ICD-10-CM | POA: Diagnosis not present

## 2016-04-12 DIAGNOSIS — I25118 Atherosclerotic heart disease of native coronary artery with other forms of angina pectoris: Secondary | ICD-10-CM | POA: Diagnosis not present

## 2016-04-15 DIAGNOSIS — I214 Non-ST elevation (NSTEMI) myocardial infarction: Secondary | ICD-10-CM | POA: Diagnosis not present

## 2016-04-15 DIAGNOSIS — R079 Chest pain, unspecified: Secondary | ICD-10-CM | POA: Diagnosis not present

## 2016-04-15 DIAGNOSIS — I25118 Atherosclerotic heart disease of native coronary artery with other forms of angina pectoris: Secondary | ICD-10-CM | POA: Diagnosis not present

## 2016-04-15 DIAGNOSIS — I209 Angina pectoris, unspecified: Secondary | ICD-10-CM | POA: Diagnosis not present

## 2016-04-15 DIAGNOSIS — R5383 Other fatigue: Secondary | ICD-10-CM | POA: Diagnosis not present

## 2016-04-15 DIAGNOSIS — I2582 Chronic total occlusion of coronary artery: Secondary | ICD-10-CM | POA: Diagnosis not present

## 2016-04-15 DIAGNOSIS — R0609 Other forms of dyspnea: Secondary | ICD-10-CM | POA: Diagnosis not present

## 2016-04-16 DIAGNOSIS — R079 Chest pain, unspecified: Secondary | ICD-10-CM | POA: Diagnosis not present

## 2016-04-16 DIAGNOSIS — I25118 Atherosclerotic heart disease of native coronary artery with other forms of angina pectoris: Secondary | ICD-10-CM | POA: Diagnosis not present

## 2016-04-16 DIAGNOSIS — I214 Non-ST elevation (NSTEMI) myocardial infarction: Secondary | ICD-10-CM | POA: Diagnosis not present

## 2016-04-16 DIAGNOSIS — I2582 Chronic total occlusion of coronary artery: Secondary | ICD-10-CM | POA: Diagnosis not present

## 2016-04-16 DIAGNOSIS — I209 Angina pectoris, unspecified: Secondary | ICD-10-CM | POA: Diagnosis not present

## 2016-04-16 DIAGNOSIS — R0609 Other forms of dyspnea: Secondary | ICD-10-CM | POA: Diagnosis not present

## 2016-04-16 DIAGNOSIS — R5383 Other fatigue: Secondary | ICD-10-CM | POA: Diagnosis not present

## 2016-04-17 DIAGNOSIS — I25118 Atherosclerotic heart disease of native coronary artery with other forms of angina pectoris: Secondary | ICD-10-CM | POA: Diagnosis not present

## 2016-04-17 DIAGNOSIS — R0609 Other forms of dyspnea: Secondary | ICD-10-CM | POA: Diagnosis not present

## 2016-04-17 DIAGNOSIS — R079 Chest pain, unspecified: Secondary | ICD-10-CM | POA: Diagnosis not present

## 2016-04-17 DIAGNOSIS — I214 Non-ST elevation (NSTEMI) myocardial infarction: Secondary | ICD-10-CM | POA: Diagnosis not present

## 2016-04-17 DIAGNOSIS — I209 Angina pectoris, unspecified: Secondary | ICD-10-CM | POA: Diagnosis not present

## 2016-04-17 DIAGNOSIS — I2582 Chronic total occlusion of coronary artery: Secondary | ICD-10-CM | POA: Diagnosis not present

## 2016-04-17 DIAGNOSIS — R5383 Other fatigue: Secondary | ICD-10-CM | POA: Diagnosis not present

## 2016-04-18 DIAGNOSIS — I214 Non-ST elevation (NSTEMI) myocardial infarction: Secondary | ICD-10-CM | POA: Diagnosis not present

## 2016-04-18 DIAGNOSIS — R5383 Other fatigue: Secondary | ICD-10-CM | POA: Diagnosis not present

## 2016-04-18 DIAGNOSIS — I2582 Chronic total occlusion of coronary artery: Secondary | ICD-10-CM | POA: Diagnosis not present

## 2016-04-18 DIAGNOSIS — R079 Chest pain, unspecified: Secondary | ICD-10-CM | POA: Diagnosis not present

## 2016-04-18 DIAGNOSIS — R0609 Other forms of dyspnea: Secondary | ICD-10-CM | POA: Diagnosis not present

## 2016-04-18 DIAGNOSIS — I209 Angina pectoris, unspecified: Secondary | ICD-10-CM | POA: Diagnosis not present

## 2016-04-18 DIAGNOSIS — I25118 Atherosclerotic heart disease of native coronary artery with other forms of angina pectoris: Secondary | ICD-10-CM | POA: Diagnosis not present

## 2016-04-19 DIAGNOSIS — I209 Angina pectoris, unspecified: Secondary | ICD-10-CM | POA: Diagnosis not present

## 2016-04-19 DIAGNOSIS — I214 Non-ST elevation (NSTEMI) myocardial infarction: Secondary | ICD-10-CM | POA: Diagnosis not present

## 2016-04-19 DIAGNOSIS — I2582 Chronic total occlusion of coronary artery: Secondary | ICD-10-CM | POA: Diagnosis not present

## 2016-04-19 DIAGNOSIS — R0609 Other forms of dyspnea: Secondary | ICD-10-CM | POA: Diagnosis not present

## 2016-04-19 DIAGNOSIS — I25118 Atherosclerotic heart disease of native coronary artery with other forms of angina pectoris: Secondary | ICD-10-CM | POA: Diagnosis not present

## 2016-04-19 DIAGNOSIS — R079 Chest pain, unspecified: Secondary | ICD-10-CM | POA: Diagnosis not present

## 2016-04-19 DIAGNOSIS — R5383 Other fatigue: Secondary | ICD-10-CM | POA: Diagnosis not present

## 2016-04-22 DIAGNOSIS — I2582 Chronic total occlusion of coronary artery: Secondary | ICD-10-CM | POA: Diagnosis not present

## 2016-04-22 DIAGNOSIS — I214 Non-ST elevation (NSTEMI) myocardial infarction: Secondary | ICD-10-CM | POA: Diagnosis not present

## 2016-04-22 DIAGNOSIS — E1169 Type 2 diabetes mellitus with other specified complication: Secondary | ICD-10-CM | POA: Diagnosis not present

## 2016-04-22 DIAGNOSIS — I25118 Atherosclerotic heart disease of native coronary artery with other forms of angina pectoris: Secondary | ICD-10-CM | POA: Diagnosis not present

## 2016-04-22 DIAGNOSIS — N183 Chronic kidney disease, stage 3 (moderate): Secondary | ICD-10-CM | POA: Diagnosis not present

## 2016-04-22 DIAGNOSIS — I714 Abdominal aortic aneurysm, without rupture: Secondary | ICD-10-CM | POA: Diagnosis not present

## 2016-04-22 DIAGNOSIS — Z6824 Body mass index (BMI) 24.0-24.9, adult: Secondary | ICD-10-CM | POA: Diagnosis not present

## 2016-04-22 DIAGNOSIS — D631 Anemia in chronic kidney disease: Secondary | ICD-10-CM | POA: Diagnosis not present

## 2016-04-22 DIAGNOSIS — R5383 Other fatigue: Secondary | ICD-10-CM | POA: Diagnosis not present

## 2016-04-22 DIAGNOSIS — I209 Angina pectoris, unspecified: Secondary | ICD-10-CM | POA: Diagnosis not present

## 2016-04-22 DIAGNOSIS — I10 Essential (primary) hypertension: Secondary | ICD-10-CM | POA: Diagnosis not present

## 2016-04-22 DIAGNOSIS — R079 Chest pain, unspecified: Secondary | ICD-10-CM | POA: Diagnosis not present

## 2016-04-22 DIAGNOSIS — R0609 Other forms of dyspnea: Secondary | ICD-10-CM | POA: Diagnosis not present

## 2016-04-23 DIAGNOSIS — I209 Angina pectoris, unspecified: Secondary | ICD-10-CM | POA: Diagnosis not present

## 2016-04-23 DIAGNOSIS — R079 Chest pain, unspecified: Secondary | ICD-10-CM | POA: Diagnosis not present

## 2016-04-23 DIAGNOSIS — I214 Non-ST elevation (NSTEMI) myocardial infarction: Secondary | ICD-10-CM | POA: Diagnosis not present

## 2016-04-23 DIAGNOSIS — R5383 Other fatigue: Secondary | ICD-10-CM | POA: Diagnosis not present

## 2016-04-23 DIAGNOSIS — R0609 Other forms of dyspnea: Secondary | ICD-10-CM | POA: Diagnosis not present

## 2016-04-23 DIAGNOSIS — I25118 Atherosclerotic heart disease of native coronary artery with other forms of angina pectoris: Secondary | ICD-10-CM | POA: Diagnosis not present

## 2016-04-23 DIAGNOSIS — I2582 Chronic total occlusion of coronary artery: Secondary | ICD-10-CM | POA: Diagnosis not present

## 2016-04-24 DIAGNOSIS — I25118 Atherosclerotic heart disease of native coronary artery with other forms of angina pectoris: Secondary | ICD-10-CM | POA: Diagnosis not present

## 2016-04-24 DIAGNOSIS — I209 Angina pectoris, unspecified: Secondary | ICD-10-CM | POA: Diagnosis not present

## 2016-04-24 DIAGNOSIS — I2582 Chronic total occlusion of coronary artery: Secondary | ICD-10-CM | POA: Diagnosis not present

## 2016-04-24 DIAGNOSIS — R079 Chest pain, unspecified: Secondary | ICD-10-CM | POA: Diagnosis not present

## 2016-04-24 DIAGNOSIS — R5383 Other fatigue: Secondary | ICD-10-CM | POA: Diagnosis not present

## 2016-04-24 DIAGNOSIS — I214 Non-ST elevation (NSTEMI) myocardial infarction: Secondary | ICD-10-CM | POA: Diagnosis not present

## 2016-04-24 DIAGNOSIS — R0609 Other forms of dyspnea: Secondary | ICD-10-CM | POA: Diagnosis not present

## 2016-04-25 DIAGNOSIS — R0609 Other forms of dyspnea: Secondary | ICD-10-CM | POA: Diagnosis not present

## 2016-04-25 DIAGNOSIS — I2582 Chronic total occlusion of coronary artery: Secondary | ICD-10-CM | POA: Diagnosis not present

## 2016-04-25 DIAGNOSIS — R079 Chest pain, unspecified: Secondary | ICD-10-CM | POA: Diagnosis not present

## 2016-04-25 DIAGNOSIS — I25118 Atherosclerotic heart disease of native coronary artery with other forms of angina pectoris: Secondary | ICD-10-CM | POA: Diagnosis not present

## 2016-04-25 DIAGNOSIS — I209 Angina pectoris, unspecified: Secondary | ICD-10-CM | POA: Diagnosis not present

## 2016-04-25 DIAGNOSIS — R5383 Other fatigue: Secondary | ICD-10-CM | POA: Diagnosis not present

## 2016-04-25 DIAGNOSIS — I214 Non-ST elevation (NSTEMI) myocardial infarction: Secondary | ICD-10-CM | POA: Diagnosis not present

## 2016-04-26 DIAGNOSIS — I214 Non-ST elevation (NSTEMI) myocardial infarction: Secondary | ICD-10-CM | POA: Diagnosis not present

## 2016-04-26 DIAGNOSIS — R079 Chest pain, unspecified: Secondary | ICD-10-CM | POA: Diagnosis not present

## 2016-04-26 DIAGNOSIS — I2582 Chronic total occlusion of coronary artery: Secondary | ICD-10-CM | POA: Diagnosis not present

## 2016-04-26 DIAGNOSIS — I714 Abdominal aortic aneurysm, without rupture: Secondary | ICD-10-CM | POA: Diagnosis not present

## 2016-04-26 DIAGNOSIS — I209 Angina pectoris, unspecified: Secondary | ICD-10-CM | POA: Diagnosis not present

## 2016-04-26 DIAGNOSIS — D631 Anemia in chronic kidney disease: Secondary | ICD-10-CM | POA: Diagnosis not present

## 2016-04-26 DIAGNOSIS — R0609 Other forms of dyspnea: Secondary | ICD-10-CM | POA: Diagnosis not present

## 2016-04-26 DIAGNOSIS — R5383 Other fatigue: Secondary | ICD-10-CM | POA: Diagnosis not present

## 2016-04-26 DIAGNOSIS — I25118 Atherosclerotic heart disease of native coronary artery with other forms of angina pectoris: Secondary | ICD-10-CM | POA: Diagnosis not present

## 2016-04-26 DIAGNOSIS — N183 Chronic kidney disease, stage 3 (moderate): Secondary | ICD-10-CM | POA: Diagnosis not present

## 2016-04-29 DIAGNOSIS — I209 Angina pectoris, unspecified: Secondary | ICD-10-CM | POA: Diagnosis not present

## 2016-04-29 DIAGNOSIS — I214 Non-ST elevation (NSTEMI) myocardial infarction: Secondary | ICD-10-CM | POA: Diagnosis not present

## 2016-04-29 DIAGNOSIS — R5383 Other fatigue: Secondary | ICD-10-CM | POA: Diagnosis not present

## 2016-04-29 DIAGNOSIS — I2582 Chronic total occlusion of coronary artery: Secondary | ICD-10-CM | POA: Diagnosis not present

## 2016-04-29 DIAGNOSIS — I25118 Atherosclerotic heart disease of native coronary artery with other forms of angina pectoris: Secondary | ICD-10-CM | POA: Diagnosis not present

## 2016-04-29 DIAGNOSIS — R0609 Other forms of dyspnea: Secondary | ICD-10-CM | POA: Diagnosis not present

## 2016-04-29 DIAGNOSIS — R079 Chest pain, unspecified: Secondary | ICD-10-CM | POA: Diagnosis not present

## 2016-04-30 DIAGNOSIS — R5383 Other fatigue: Secondary | ICD-10-CM | POA: Diagnosis not present

## 2016-04-30 DIAGNOSIS — R079 Chest pain, unspecified: Secondary | ICD-10-CM | POA: Diagnosis not present

## 2016-04-30 DIAGNOSIS — I2582 Chronic total occlusion of coronary artery: Secondary | ICD-10-CM | POA: Diagnosis not present

## 2016-04-30 DIAGNOSIS — I214 Non-ST elevation (NSTEMI) myocardial infarction: Secondary | ICD-10-CM | POA: Diagnosis not present

## 2016-04-30 DIAGNOSIS — I25118 Atherosclerotic heart disease of native coronary artery with other forms of angina pectoris: Secondary | ICD-10-CM | POA: Diagnosis not present

## 2016-04-30 DIAGNOSIS — I209 Angina pectoris, unspecified: Secondary | ICD-10-CM | POA: Diagnosis not present

## 2016-04-30 DIAGNOSIS — R0609 Other forms of dyspnea: Secondary | ICD-10-CM | POA: Diagnosis not present

## 2016-05-01 DIAGNOSIS — R5383 Other fatigue: Secondary | ICD-10-CM | POA: Diagnosis not present

## 2016-05-01 DIAGNOSIS — I25118 Atherosclerotic heart disease of native coronary artery with other forms of angina pectoris: Secondary | ICD-10-CM | POA: Diagnosis not present

## 2016-05-01 DIAGNOSIS — I209 Angina pectoris, unspecified: Secondary | ICD-10-CM | POA: Diagnosis not present

## 2016-05-01 DIAGNOSIS — R079 Chest pain, unspecified: Secondary | ICD-10-CM | POA: Diagnosis not present

## 2016-05-01 DIAGNOSIS — I2582 Chronic total occlusion of coronary artery: Secondary | ICD-10-CM | POA: Diagnosis not present

## 2016-05-01 DIAGNOSIS — I214 Non-ST elevation (NSTEMI) myocardial infarction: Secondary | ICD-10-CM | POA: Diagnosis not present

## 2016-05-01 DIAGNOSIS — R0609 Other forms of dyspnea: Secondary | ICD-10-CM | POA: Diagnosis not present

## 2016-05-02 DIAGNOSIS — I209 Angina pectoris, unspecified: Secondary | ICD-10-CM | POA: Diagnosis not present

## 2016-05-02 DIAGNOSIS — I2582 Chronic total occlusion of coronary artery: Secondary | ICD-10-CM | POA: Diagnosis not present

## 2016-05-02 DIAGNOSIS — I25118 Atherosclerotic heart disease of native coronary artery with other forms of angina pectoris: Secondary | ICD-10-CM | POA: Diagnosis not present

## 2016-05-02 DIAGNOSIS — R079 Chest pain, unspecified: Secondary | ICD-10-CM | POA: Diagnosis not present

## 2016-05-02 DIAGNOSIS — R0609 Other forms of dyspnea: Secondary | ICD-10-CM | POA: Diagnosis not present

## 2016-05-02 DIAGNOSIS — R5383 Other fatigue: Secondary | ICD-10-CM | POA: Diagnosis not present

## 2016-05-02 DIAGNOSIS — I214 Non-ST elevation (NSTEMI) myocardial infarction: Secondary | ICD-10-CM | POA: Diagnosis not present

## 2016-05-03 DIAGNOSIS — R5383 Other fatigue: Secondary | ICD-10-CM | POA: Diagnosis not present

## 2016-05-03 DIAGNOSIS — I209 Angina pectoris, unspecified: Secondary | ICD-10-CM | POA: Diagnosis not present

## 2016-05-03 DIAGNOSIS — I25118 Atherosclerotic heart disease of native coronary artery with other forms of angina pectoris: Secondary | ICD-10-CM | POA: Diagnosis not present

## 2016-05-03 DIAGNOSIS — R079 Chest pain, unspecified: Secondary | ICD-10-CM | POA: Diagnosis not present

## 2016-05-03 DIAGNOSIS — I2582 Chronic total occlusion of coronary artery: Secondary | ICD-10-CM | POA: Diagnosis not present

## 2016-05-03 DIAGNOSIS — R0609 Other forms of dyspnea: Secondary | ICD-10-CM | POA: Diagnosis not present

## 2016-05-03 DIAGNOSIS — I214 Non-ST elevation (NSTEMI) myocardial infarction: Secondary | ICD-10-CM | POA: Diagnosis not present

## 2016-05-06 DIAGNOSIS — R0609 Other forms of dyspnea: Secondary | ICD-10-CM | POA: Diagnosis not present

## 2016-05-06 DIAGNOSIS — I2582 Chronic total occlusion of coronary artery: Secondary | ICD-10-CM | POA: Diagnosis not present

## 2016-05-06 DIAGNOSIS — I25118 Atherosclerotic heart disease of native coronary artery with other forms of angina pectoris: Secondary | ICD-10-CM | POA: Diagnosis not present

## 2016-05-06 DIAGNOSIS — R079 Chest pain, unspecified: Secondary | ICD-10-CM | POA: Diagnosis not present

## 2016-05-06 DIAGNOSIS — R5383 Other fatigue: Secondary | ICD-10-CM | POA: Diagnosis not present

## 2016-05-06 DIAGNOSIS — I214 Non-ST elevation (NSTEMI) myocardial infarction: Secondary | ICD-10-CM | POA: Diagnosis not present

## 2016-05-06 DIAGNOSIS — I209 Angina pectoris, unspecified: Secondary | ICD-10-CM | POA: Diagnosis not present

## 2016-05-07 DIAGNOSIS — I214 Non-ST elevation (NSTEMI) myocardial infarction: Secondary | ICD-10-CM | POA: Diagnosis not present

## 2016-05-07 DIAGNOSIS — R0609 Other forms of dyspnea: Secondary | ICD-10-CM | POA: Diagnosis not present

## 2016-05-07 DIAGNOSIS — R079 Chest pain, unspecified: Secondary | ICD-10-CM | POA: Diagnosis not present

## 2016-05-07 DIAGNOSIS — I25118 Atherosclerotic heart disease of native coronary artery with other forms of angina pectoris: Secondary | ICD-10-CM | POA: Diagnosis not present

## 2016-05-07 DIAGNOSIS — I209 Angina pectoris, unspecified: Secondary | ICD-10-CM | POA: Diagnosis not present

## 2016-05-07 DIAGNOSIS — R5383 Other fatigue: Secondary | ICD-10-CM | POA: Diagnosis not present

## 2016-05-07 DIAGNOSIS — I2582 Chronic total occlusion of coronary artery: Secondary | ICD-10-CM | POA: Diagnosis not present

## 2016-05-08 DIAGNOSIS — I209 Angina pectoris, unspecified: Secondary | ICD-10-CM | POA: Diagnosis not present

## 2016-05-08 DIAGNOSIS — I214 Non-ST elevation (NSTEMI) myocardial infarction: Secondary | ICD-10-CM | POA: Diagnosis not present

## 2016-05-08 DIAGNOSIS — R5383 Other fatigue: Secondary | ICD-10-CM | POA: Diagnosis not present

## 2016-05-08 DIAGNOSIS — R079 Chest pain, unspecified: Secondary | ICD-10-CM | POA: Diagnosis not present

## 2016-05-08 DIAGNOSIS — R0609 Other forms of dyspnea: Secondary | ICD-10-CM | POA: Diagnosis not present

## 2016-05-08 DIAGNOSIS — I2582 Chronic total occlusion of coronary artery: Secondary | ICD-10-CM | POA: Diagnosis not present

## 2016-05-08 DIAGNOSIS — I25118 Atherosclerotic heart disease of native coronary artery with other forms of angina pectoris: Secondary | ICD-10-CM | POA: Diagnosis not present

## 2016-05-09 DIAGNOSIS — I2582 Chronic total occlusion of coronary artery: Secondary | ICD-10-CM | POA: Diagnosis not present

## 2016-05-09 DIAGNOSIS — I214 Non-ST elevation (NSTEMI) myocardial infarction: Secondary | ICD-10-CM | POA: Diagnosis not present

## 2016-05-09 DIAGNOSIS — I209 Angina pectoris, unspecified: Secondary | ICD-10-CM | POA: Diagnosis not present

## 2016-05-09 DIAGNOSIS — R079 Chest pain, unspecified: Secondary | ICD-10-CM | POA: Diagnosis not present

## 2016-05-09 DIAGNOSIS — R5383 Other fatigue: Secondary | ICD-10-CM | POA: Diagnosis not present

## 2016-05-09 DIAGNOSIS — I25118 Atherosclerotic heart disease of native coronary artery with other forms of angina pectoris: Secondary | ICD-10-CM | POA: Diagnosis not present

## 2016-05-09 DIAGNOSIS — R0609 Other forms of dyspnea: Secondary | ICD-10-CM | POA: Diagnosis not present

## 2016-05-10 DIAGNOSIS — I25118 Atherosclerotic heart disease of native coronary artery with other forms of angina pectoris: Secondary | ICD-10-CM | POA: Diagnosis not present

## 2016-05-10 DIAGNOSIS — I209 Angina pectoris, unspecified: Secondary | ICD-10-CM | POA: Diagnosis not present

## 2016-05-10 DIAGNOSIS — I214 Non-ST elevation (NSTEMI) myocardial infarction: Secondary | ICD-10-CM | POA: Diagnosis not present

## 2016-05-10 DIAGNOSIS — R079 Chest pain, unspecified: Secondary | ICD-10-CM | POA: Diagnosis not present

## 2016-05-10 DIAGNOSIS — I2582 Chronic total occlusion of coronary artery: Secondary | ICD-10-CM | POA: Diagnosis not present

## 2016-05-10 DIAGNOSIS — R0609 Other forms of dyspnea: Secondary | ICD-10-CM | POA: Diagnosis not present

## 2016-05-10 DIAGNOSIS — R5383 Other fatigue: Secondary | ICD-10-CM | POA: Diagnosis not present

## 2016-05-23 DIAGNOSIS — D631 Anemia in chronic kidney disease: Secondary | ICD-10-CM | POA: Diagnosis not present

## 2016-05-23 DIAGNOSIS — N183 Chronic kidney disease, stage 3 (moderate): Secondary | ICD-10-CM | POA: Diagnosis not present

## 2016-05-28 DIAGNOSIS — N183 Chronic kidney disease, stage 3 (moderate): Secondary | ICD-10-CM | POA: Diagnosis not present

## 2016-05-28 DIAGNOSIS — D631 Anemia in chronic kidney disease: Secondary | ICD-10-CM | POA: Diagnosis not present

## 2016-05-29 DIAGNOSIS — H40052 Ocular hypertension, left eye: Secondary | ICD-10-CM | POA: Diagnosis not present

## 2016-05-29 DIAGNOSIS — H00015 Hordeolum externum left lower eyelid: Secondary | ICD-10-CM | POA: Diagnosis not present

## 2016-06-06 DIAGNOSIS — D631 Anemia in chronic kidney disease: Secondary | ICD-10-CM | POA: Diagnosis not present

## 2016-06-06 DIAGNOSIS — E782 Mixed hyperlipidemia: Secondary | ICD-10-CM | POA: Diagnosis not present

## 2016-06-06 DIAGNOSIS — N183 Chronic kidney disease, stage 3 (moderate): Secondary | ICD-10-CM | POA: Diagnosis not present

## 2016-06-06 DIAGNOSIS — I259 Chronic ischemic heart disease, unspecified: Secondary | ICD-10-CM | POA: Diagnosis not present

## 2016-06-11 DIAGNOSIS — N183 Chronic kidney disease, stage 3 (moderate): Secondary | ICD-10-CM | POA: Diagnosis not present

## 2016-06-11 DIAGNOSIS — D631 Anemia in chronic kidney disease: Secondary | ICD-10-CM | POA: Diagnosis not present

## 2016-06-19 DIAGNOSIS — D631 Anemia in chronic kidney disease: Secondary | ICD-10-CM | POA: Diagnosis not present

## 2016-06-19 DIAGNOSIS — N183 Chronic kidney disease, stage 3 (moderate): Secondary | ICD-10-CM | POA: Diagnosis not present

## 2016-06-25 DIAGNOSIS — D631 Anemia in chronic kidney disease: Secondary | ICD-10-CM | POA: Diagnosis not present

## 2016-06-25 DIAGNOSIS — N183 Chronic kidney disease, stage 3 (moderate): Secondary | ICD-10-CM | POA: Diagnosis not present

## 2016-07-03 DIAGNOSIS — D631 Anemia in chronic kidney disease: Secondary | ICD-10-CM | POA: Diagnosis not present

## 2016-07-03 DIAGNOSIS — N183 Chronic kidney disease, stage 3 (moderate): Secondary | ICD-10-CM | POA: Diagnosis not present

## 2016-07-18 DIAGNOSIS — N183 Chronic kidney disease, stage 3 (moderate): Secondary | ICD-10-CM | POA: Diagnosis not present

## 2016-07-18 DIAGNOSIS — D631 Anemia in chronic kidney disease: Secondary | ICD-10-CM | POA: Diagnosis not present

## 2016-07-22 DIAGNOSIS — E1169 Type 2 diabetes mellitus with other specified complication: Secondary | ICD-10-CM | POA: Diagnosis not present

## 2016-07-22 DIAGNOSIS — Z794 Long term (current) use of insulin: Secondary | ICD-10-CM | POA: Diagnosis not present

## 2016-07-22 DIAGNOSIS — I10 Essential (primary) hypertension: Secondary | ICD-10-CM | POA: Diagnosis not present

## 2016-07-22 DIAGNOSIS — E784 Other hyperlipidemia: Secondary | ICD-10-CM | POA: Diagnosis not present

## 2016-07-22 DIAGNOSIS — N183 Chronic kidney disease, stage 3 (moderate): Secondary | ICD-10-CM | POA: Diagnosis not present

## 2016-07-22 DIAGNOSIS — D631 Anemia in chronic kidney disease: Secondary | ICD-10-CM | POA: Diagnosis not present

## 2016-07-23 DIAGNOSIS — R197 Diarrhea, unspecified: Secondary | ICD-10-CM | POA: Diagnosis not present

## 2016-07-23 DIAGNOSIS — R159 Full incontinence of feces: Secondary | ICD-10-CM | POA: Diagnosis not present

## 2016-07-23 DIAGNOSIS — Z6822 Body mass index (BMI) 22.0-22.9, adult: Secondary | ICD-10-CM | POA: Diagnosis not present

## 2016-07-31 DIAGNOSIS — N183 Chronic kidney disease, stage 3 (moderate): Secondary | ICD-10-CM | POA: Diagnosis not present

## 2016-07-31 DIAGNOSIS — D631 Anemia in chronic kidney disease: Secondary | ICD-10-CM | POA: Diagnosis not present

## 2016-08-15 DIAGNOSIS — N183 Chronic kidney disease, stage 3 (moderate): Secondary | ICD-10-CM | POA: Diagnosis not present

## 2016-08-15 DIAGNOSIS — D631 Anemia in chronic kidney disease: Secondary | ICD-10-CM | POA: Diagnosis not present

## 2016-09-16 DIAGNOSIS — D631 Anemia in chronic kidney disease: Secondary | ICD-10-CM | POA: Diagnosis not present

## 2016-09-16 DIAGNOSIS — N183 Chronic kidney disease, stage 3 (moderate): Secondary | ICD-10-CM | POA: Diagnosis not present

## 2016-09-19 DIAGNOSIS — N184 Chronic kidney disease, stage 4 (severe): Secondary | ICD-10-CM | POA: Diagnosis not present

## 2016-09-19 DIAGNOSIS — E119 Type 2 diabetes mellitus without complications: Secondary | ICD-10-CM | POA: Diagnosis not present

## 2016-09-19 DIAGNOSIS — D631 Anemia in chronic kidney disease: Secondary | ICD-10-CM | POA: Diagnosis not present

## 2016-09-19 DIAGNOSIS — N2581 Secondary hyperparathyroidism of renal origin: Secondary | ICD-10-CM | POA: Diagnosis not present

## 2016-09-24 DIAGNOSIS — I10 Essential (primary) hypertension: Secondary | ICD-10-CM | POA: Diagnosis not present

## 2016-09-24 DIAGNOSIS — N2581 Secondary hyperparathyroidism of renal origin: Secondary | ICD-10-CM | POA: Diagnosis not present

## 2016-09-24 DIAGNOSIS — N184 Chronic kidney disease, stage 4 (severe): Secondary | ICD-10-CM | POA: Diagnosis not present

## 2016-09-24 DIAGNOSIS — D631 Anemia in chronic kidney disease: Secondary | ICD-10-CM | POA: Diagnosis not present

## 2016-10-01 DIAGNOSIS — N183 Chronic kidney disease, stage 3 (moderate): Secondary | ICD-10-CM | POA: Diagnosis not present

## 2016-10-01 DIAGNOSIS — D631 Anemia in chronic kidney disease: Secondary | ICD-10-CM | POA: Diagnosis not present

## 2016-10-10 DIAGNOSIS — D631 Anemia in chronic kidney disease: Secondary | ICD-10-CM | POA: Diagnosis not present

## 2016-10-10 DIAGNOSIS — N183 Chronic kidney disease, stage 3 (moderate): Secondary | ICD-10-CM | POA: Diagnosis not present

## 2016-10-15 DIAGNOSIS — D631 Anemia in chronic kidney disease: Secondary | ICD-10-CM | POA: Diagnosis not present

## 2016-10-15 DIAGNOSIS — N183 Chronic kidney disease, stage 3 (moderate): Secondary | ICD-10-CM | POA: Diagnosis not present

## 2016-10-24 DIAGNOSIS — D631 Anemia in chronic kidney disease: Secondary | ICD-10-CM | POA: Diagnosis not present

## 2016-10-24 DIAGNOSIS — N183 Chronic kidney disease, stage 3 (moderate): Secondary | ICD-10-CM | POA: Diagnosis not present

## 2016-11-06 DIAGNOSIS — D631 Anemia in chronic kidney disease: Secondary | ICD-10-CM | POA: Diagnosis not present

## 2016-11-06 DIAGNOSIS — N183 Chronic kidney disease, stage 3 (moderate): Secondary | ICD-10-CM | POA: Diagnosis not present

## 2016-11-07 DIAGNOSIS — E1121 Type 2 diabetes mellitus with diabetic nephropathy: Secondary | ICD-10-CM | POA: Diagnosis not present

## 2016-11-07 DIAGNOSIS — Z23 Encounter for immunization: Secondary | ICD-10-CM | POA: Diagnosis not present

## 2016-11-07 DIAGNOSIS — I10 Essential (primary) hypertension: Secondary | ICD-10-CM | POA: Diagnosis not present

## 2016-11-07 DIAGNOSIS — Z0001 Encounter for general adult medical examination with abnormal findings: Secondary | ICD-10-CM | POA: Diagnosis not present

## 2016-11-07 DIAGNOSIS — Z1389 Encounter for screening for other disorder: Secondary | ICD-10-CM | POA: Diagnosis not present

## 2016-11-07 DIAGNOSIS — Z0000 Encounter for general adult medical examination without abnormal findings: Secondary | ICD-10-CM | POA: Diagnosis not present

## 2016-11-07 DIAGNOSIS — E1169 Type 2 diabetes mellitus with other specified complication: Secondary | ICD-10-CM | POA: Diagnosis not present

## 2016-11-07 DIAGNOSIS — D631 Anemia in chronic kidney disease: Secondary | ICD-10-CM | POA: Diagnosis not present

## 2016-11-07 DIAGNOSIS — N401 Enlarged prostate with lower urinary tract symptoms: Secondary | ICD-10-CM | POA: Diagnosis not present

## 2016-11-08 DIAGNOSIS — Z1389 Encounter for screening for other disorder: Secondary | ICD-10-CM | POA: Diagnosis not present

## 2016-11-08 DIAGNOSIS — N401 Enlarged prostate with lower urinary tract symptoms: Secondary | ICD-10-CM | POA: Diagnosis not present

## 2016-11-08 DIAGNOSIS — D631 Anemia in chronic kidney disease: Secondary | ICD-10-CM | POA: Diagnosis not present

## 2016-11-08 DIAGNOSIS — E1169 Type 2 diabetes mellitus with other specified complication: Secondary | ICD-10-CM | POA: Diagnosis not present

## 2016-11-08 DIAGNOSIS — E1121 Type 2 diabetes mellitus with diabetic nephropathy: Secondary | ICD-10-CM | POA: Diagnosis not present

## 2016-11-08 DIAGNOSIS — Z23 Encounter for immunization: Secondary | ICD-10-CM | POA: Diagnosis not present

## 2016-11-08 DIAGNOSIS — Z0000 Encounter for general adult medical examination without abnormal findings: Secondary | ICD-10-CM | POA: Diagnosis not present

## 2016-11-08 DIAGNOSIS — Z0001 Encounter for general adult medical examination with abnormal findings: Secondary | ICD-10-CM | POA: Diagnosis not present

## 2016-11-21 DIAGNOSIS — D631 Anemia in chronic kidney disease: Secondary | ICD-10-CM | POA: Diagnosis not present

## 2016-11-21 DIAGNOSIS — N183 Chronic kidney disease, stage 3 (moderate): Secondary | ICD-10-CM | POA: Diagnosis not present

## 2016-11-25 DIAGNOSIS — D631 Anemia in chronic kidney disease: Secondary | ICD-10-CM | POA: Diagnosis not present

## 2016-11-25 DIAGNOSIS — N183 Chronic kidney disease, stage 3 (moderate): Secondary | ICD-10-CM | POA: Diagnosis not present

## 2016-12-09 ENCOUNTER — Ambulatory Visit (INDEPENDENT_AMBULATORY_CARE_PROVIDER_SITE_OTHER): Payer: Medicare Other | Admitting: Primary Care

## 2016-12-09 ENCOUNTER — Encounter: Payer: Self-pay | Admitting: Primary Care

## 2016-12-09 VITALS — BP 140/80 | HR 61 | Temp 98.1°F | Ht 69.25 in | Wt 163.8 lb

## 2016-12-09 DIAGNOSIS — I25118 Atherosclerotic heart disease of native coronary artery with other forms of angina pectoris: Secondary | ICD-10-CM | POA: Insufficient documentation

## 2016-12-09 DIAGNOSIS — Z794 Long term (current) use of insulin: Secondary | ICD-10-CM

## 2016-12-09 DIAGNOSIS — N183 Chronic kidney disease, stage 3 unspecified: Secondary | ICD-10-CM | POA: Insufficient documentation

## 2016-12-09 DIAGNOSIS — E118 Type 2 diabetes mellitus with unspecified complications: Secondary | ICD-10-CM | POA: Diagnosis not present

## 2016-12-09 DIAGNOSIS — E039 Hypothyroidism, unspecified: Secondary | ICD-10-CM | POA: Insufficient documentation

## 2016-12-09 DIAGNOSIS — K219 Gastro-esophageal reflux disease without esophagitis: Secondary | ICD-10-CM

## 2016-12-09 DIAGNOSIS — E119 Type 2 diabetes mellitus without complications: Secondary | ICD-10-CM | POA: Insufficient documentation

## 2016-12-09 DIAGNOSIS — Z0100 Encounter for examination of eyes and vision without abnormal findings: Secondary | ICD-10-CM

## 2016-12-09 DIAGNOSIS — I2510 Atherosclerotic heart disease of native coronary artery without angina pectoris: Secondary | ICD-10-CM

## 2016-12-09 NOTE — Progress Notes (Signed)
Subjective:    Patient ID: Tommy [name], male    DOB: 09-26-1926, 83 y.o.   MRN: [id number]  HPI  Mr. Torrez is a 83 year old male who presents today to establish care and discuss the problems mentioned below. Will obtain old records.  1) CAD/Essential Hypertension/[name]: Currently managed on carvedilol 6.25 mg, clopidogrel 75 mg, furosemide, atorvastatin 40 mg. He was previously following with cardiology in [place]. He denies chest pain, dizziness, shortness of breath. He recently saw his cardiologist before his move to [place].   2) Hypothyroidism: Currently managed on levothyroxine 150 mcg. His last TSH was sometime in 2018. He denies dose change over the past several years.   3) Type 2 Diabetes: Currently managed on Levemir 14 units HS and glimepiride 4 mg. He checks his blood sugars every morning before eating which range 120-140. His last A1C was 7.1 three weeks ago. He denies dizziness, numbness/tingling.   4) GERD: Currently managed on pantoprazole 40 mg. Previously tried Zantac and Prilosec without improvement. Symptoms include esophageal burning and reflux. He's never tried a reduced dose of pantoprazole.   5) Chronic Kidney Disease: Stage three. Currently managed on Procrit for which he receives every 2-4 weeks for hemoglobin level below 11. He was following with nephrology in [place]. He denies weakness, fatigue.  Review of Systems  Constitutional: Negative for fatigue.  Respiratory: Negative for shortness of breath.   Cardiovascular: Negative for chest pain.  Gastrointestinal:       GERD  Neurological: Negative for dizziness and headaches.       Past Medical History:  Diagnosis Date  . Anemia   . CKD (chronic kidney disease) stage 3, GFR 30-59 ml/min (HCC)   . GERD (gastroesophageal reflux disease)   . Hemorrhoids   . Hypertension   . Pulmonary emboli ([place])   . Thyroid disease      Social History   Social History  . Marital status: Widowed    Spouse name: N/A  . Number of children: N/A  . Years of education: N/A   Occupational History  . Not on file.   Social History Main Topics  . Smoking status: Former Research scientist (life sciences)  . Smokeless tobacco: Never Used  . Alcohol use Yes  . Drug use: Unknown  . Sexual activity: Not on file   Other Topics Concern  . Not on file   Social History Narrative   Widower.   4 children, 7 grandchildren, 1 great grandchild.   Retired. Once worked as an account.    Enjoys exercising, spending time with family.     Past Surgical History:  Procedure Laterality Date  . CAROTID ENDARTERECTOMY Left 2004  . CORONARY ARTERY BYPASS GRAFT  1994   [name]  . TONSILLECTOMY AND ADENOIDECTOMY      Family History  Problem Relation Age of Onset  . Heart attack Mother   . Heart disease Mother   . Diabetes Father     Allergies  Allergen Reactions  . Heparin     No current outpatient prescriptions on file prior to visit.   No current facility-administered medications on file prior to visit.     BP 140/80   Pulse 61   Temp 98.1 F (36.7 C) (Oral)   Ht 5' 9.25" (1.759 m)   Wt 163 lb 12.8 oz (74.3 kg)   SpO2 98%   BMI 24.01 kg/m    Objective:   Physical Exam  Constitutional: He is oriented to person, place, and  time. He appears well-nourished.  Neck: Neck supple.  Cardiovascular: Normal rate and regular rhythm.   Pulmonary/Chest: Effort normal and breath sounds normal. He has no wheezes. He has no rales.  Neurological: He is alert and oriented to person, place, and time.  Skin: Skin is warm and dry.  Psychiatric: He has a normal mood and affect.          Assessment & Plan:

## 2016-12-09 NOTE — Patient Instructions (Signed)
You will be contacted regarding your referral to Cardiology, Opthalmology, and Nephrology.  Please let us know if you have not heard back within one week.   Please notify [facility] of your new PCP (me).   I will be in touch with you regarding follow up once I receive your records.   It was a pleasure to meet you today! Please don't hesitate to call me with any questions. Welcome to [facility]!

## 2016-12-10 ENCOUNTER — Encounter: Payer: Self-pay | Admitting: Primary Care

## 2016-12-10 DIAGNOSIS — K219 Gastro-esophageal reflux disease without esophagitis: Secondary | ICD-10-CM | POA: Insufficient documentation

## 2016-12-10 NOTE — Assessment & Plan Note (Signed)
Managed on pantoprazole 40 mg. This is a high dose, consider reduction in the future. No improvement with H2 blockers and omeprazole.

## 2016-12-10 NOTE — Assessment & Plan Note (Signed)
Endorses normal TSH recently. Obtain records for recent results. Continue levothyroxine 150 mcg.

## 2016-12-10 NOTE — Assessment & Plan Note (Signed)
History of quintuple bypass. Continue beta blocker, plavix, statin. Referral to cardiology placed.

## 2016-12-10 NOTE — Assessment & Plan Note (Signed)
Managed on Epogen every 2-4 weeks. Referral placed for nephrology.

## 2016-12-10 NOTE — Assessment & Plan Note (Signed)
Endorses recent A1C of 7.1 which is well controlled. Continue Levemir and glimepiride. Will obtain records.

## 2016-12-13 ENCOUNTER — Encounter: Payer: Self-pay | Admitting: Primary Care

## 2016-12-13 ENCOUNTER — Telehealth: Payer: Self-pay | Admitting: Primary Care

## 2016-12-13 NOTE — Telephone Encounter (Signed)
Please notify patient that I received his records and have reviewed the primary care notes. I would like to see him back in the office in 3 months for repeat A1c and cholesterol check. Please have him come fasting 4 hours prior to this appointment.

## 2016-12-16 NOTE — Telephone Encounter (Signed)
Message left for patient to return my call.  

## 2016-12-19 DIAGNOSIS — I10 Essential (primary) hypertension: Secondary | ICD-10-CM | POA: Diagnosis not present

## 2016-12-19 DIAGNOSIS — E119 Type 2 diabetes mellitus without complications: Secondary | ICD-10-CM | POA: Diagnosis not present

## 2016-12-19 DIAGNOSIS — D631 Anemia in chronic kidney disease: Secondary | ICD-10-CM | POA: Diagnosis not present

## 2016-12-19 DIAGNOSIS — N183 Chronic kidney disease, stage 3 (moderate): Secondary | ICD-10-CM | POA: Diagnosis not present

## 2016-12-19 NOTE — Telephone Encounter (Signed)
Per DPR, left detail message of Kate's comments for patient. 

## 2017-01-13 DIAGNOSIS — E119 Type 2 diabetes mellitus without complications: Secondary | ICD-10-CM | POA: Diagnosis not present

## 2017-01-13 DIAGNOSIS — H40052 Ocular hypertension, left eye: Secondary | ICD-10-CM | POA: Diagnosis not present

## 2017-01-13 LAB — HM DIABETES EYE EXAM

## 2017-01-15 DIAGNOSIS — D631 Anemia in chronic kidney disease: Secondary | ICD-10-CM | POA: Diagnosis not present

## 2017-01-15 DIAGNOSIS — I10 Essential (primary) hypertension: Secondary | ICD-10-CM | POA: Diagnosis not present

## 2017-01-15 DIAGNOSIS — N2581 Secondary hyperparathyroidism of renal origin: Secondary | ICD-10-CM | POA: Diagnosis not present

## 2017-01-15 DIAGNOSIS — N183 Chronic kidney disease, stage 3 (moderate): Secondary | ICD-10-CM | POA: Diagnosis not present

## 2017-01-16 ENCOUNTER — Encounter: Payer: Self-pay | Admitting: Primary Care

## 2017-01-17 ENCOUNTER — Other Ambulatory Visit: Payer: Self-pay | Admitting: Primary Care

## 2017-01-17 DIAGNOSIS — I2510 Atherosclerotic heart disease of native coronary artery without angina pectoris: Secondary | ICD-10-CM

## 2017-01-17 DIAGNOSIS — K219 Gastro-esophageal reflux disease without esophagitis: Secondary | ICD-10-CM

## 2017-01-17 DIAGNOSIS — E118 Type 2 diabetes mellitus with unspecified complications: Secondary | ICD-10-CM

## 2017-01-17 DIAGNOSIS — Z794 Long term (current) use of insulin: Secondary | ICD-10-CM

## 2017-01-17 DIAGNOSIS — E039 Hypothyroidism, unspecified: Secondary | ICD-10-CM

## 2017-01-17 NOTE — Telephone Encounter (Signed)
Received faxed refill request these medication and have not been prescribed by Anda Kraft. Last seen on 12/09/2016

## 2017-01-19 MED ORDER — LEVOTHYROXINE SODIUM 150 MCG PO TABS: ORAL_TABLET | ORAL | 0 refills | Status: DC

## 2017-01-19 MED ORDER — CARVEDILOL 6.25 MG PO TABS: 6.2500 mg | ORAL_TABLET | Freq: Two times a day (BID) | ORAL | 3 refills | Status: DC

## 2017-01-19 MED ORDER — CLOPIDOGREL BISULFATE 75 MG PO TABS: 75.0000 mg | ORAL_TABLET | Freq: Every day | ORAL | 3 refills | Status: DC

## 2017-01-19 MED ORDER — GLIMEPIRIDE 4 MG PO TABS: 4.0000 mg | ORAL_TABLET | Freq: Every day | ORAL | 0 refills | Status: DC

## 2017-01-19 MED ORDER — ATORVASTATIN CALCIUM 40 MG PO TABS: 40.0000 mg | ORAL_TABLET | Freq: Every day | ORAL | 3 refills | Status: DC

## 2017-01-19 NOTE — Telephone Encounter (Signed)
Noted and will refill meds. Please ask patient if he's open to trying a reduced dose of the heartburn medication called pantoprazole. He's currently on a high dose which isn't recommended for long term. Also will need to repeat labs during his appointment in January, please have him fast 4 hours prior to his visit at that time.

## 2017-01-20 NOTE — Telephone Encounter (Signed)
Patient is open to trying a reduced dose of medication, Pantoprazole.  Patient advised to come fasting for appointment in January.

## 2017-01-21 MED ORDER — PANTOPRAZOLE SODIUM 20 MG PO TBEC
20.0000 mg | DELAYED_RELEASE_TABLET | Freq: Every day | ORAL | 0 refills | Status: DC
Start: 2017-01-21 — End: 2017-03-16

## 2017-01-21 NOTE — Telephone Encounter (Signed)
Noted, Rx for pantoprazole 20 mg sent to pharmacy.

## 2017-01-29 ENCOUNTER — Encounter: Payer: Self-pay | Admitting: *Deleted

## 2017-02-04 ENCOUNTER — Encounter: Payer: Self-pay | Admitting: Internal Medicine

## 2017-02-04 ENCOUNTER — Ambulatory Visit (INDEPENDENT_AMBULATORY_CARE_PROVIDER_SITE_OTHER): Payer: Medicare Other | Admitting: Internal Medicine

## 2017-02-04 VITALS — BP 140/60 | HR 61 | Ht 69.0 in | Wt 170.0 lb

## 2017-02-04 DIAGNOSIS — I779 Disorder of arteries and arterioles, unspecified: Secondary | ICD-10-CM | POA: Diagnosis not present

## 2017-02-04 DIAGNOSIS — I10 Essential (primary) hypertension: Secondary | ICD-10-CM | POA: Diagnosis not present

## 2017-02-04 DIAGNOSIS — I358 Other nonrheumatic aortic valve disorders: Secondary | ICD-10-CM | POA: Diagnosis not present

## 2017-02-04 DIAGNOSIS — I739 Peripheral vascular disease, unspecified: Secondary | ICD-10-CM

## 2017-02-04 DIAGNOSIS — I25119 Atherosclerotic heart disease of native coronary artery with unspecified angina pectoris: Secondary | ICD-10-CM | POA: Diagnosis not present

## 2017-02-04 DIAGNOSIS — Z9889 Other specified postprocedural states: Secondary | ICD-10-CM | POA: Diagnosis not present

## 2017-02-04 DIAGNOSIS — E785 Hyperlipidemia, unspecified: Secondary | ICD-10-CM | POA: Diagnosis not present

## 2017-02-04 MED ORDER — NITROGLYCERIN 0.4 MG SL SUBL: 0.4000 mg | SUBLINGUAL_TABLET | SUBLINGUAL | 6 refills | Status: DC | PRN

## 2017-02-04 MED ORDER — RANOLAZINE ER 500 MG PO TB12
500.0000 mg | ORAL_TABLET | Freq: Two times a day (BID) | ORAL | 3 refills | Status: DC
Start: 2017-02-04 — End: 2017-06-18

## 2017-02-04 NOTE — Progress Notes (Signed)
New Outpatient Visit Date: 02/04/2017  Referring Provider: Pleas Koch, NP [address], [place] [address]  Chief Complaint: Chest pain  HPI:  Mr. Centola is a 83 y.o. male who is being seen today for the evaluation of coronary artery disease at the request of Pleas Koch, NP. He has a history of coronary artery disease status post CABG >20 years ago, peripheral vascular disease, carotid artery stenosis status post left endarterectomy, hypertension, hyperlipidemia, and type 2 diabetes mellitus.  He moved to [place] from [place] in September to live with his son.  Today, Mr. Colarusso reports feeling well.  However, he notes a few episodes of chest pain over the last few weeks that he attributes to overexerting himself on a recent cruise.  He would awaken at times with a burning sensation in his chest at night that typically lasted 5-10 minutes.  It would resolved promptly with a single sublingual nitroglycerin tablet.  The pain does not radiate and is without associated symptoms.  Mr. Persing has not had any discomfort with exertion.  He has used sublingual nitroglycerin 7-8 times over the last 2 weeks.  Prior to this, he had not had any chest pain for several months.  Mr. Stangl notes that he was hospitalized last December while still living in [place].  At that time, cardiac catheterization revealed severe native coronary artery disease with patent LIMA to LAD and vein graft to the distal RCA.  No intervention was performed at the time.  Multiple medication changes were made, including discontinuation of amlodipine and Actos.  Since that time, Mr. Vidrio had been doing quite well.  He continues to exercise regularly, walking on a treadmill at least 3 days a week and also performing weightlifting exercises.  He has not had any claudication.  Mr. Beazer brings a list of home blood pressure readings with him, most of which are in the 140s over 70s.  He notes that losartan was added by  Dr. Holley Raring within the last week.  Went on cruise last week and overdid it. Had some angina, which has been intermittent in the past. Had been several month prior since last episode. Gets a substernal burning. Always happens at night when sleeping; wakes up with it. No exertional CP. No associated symptoms. Takes NTG and goes away. Taken NTG 7-8x in last 2 weeks. No edema, orthopnea, or PND. No claudication. Goes to gym 3x/week. 1.25 mi on treadmill and weights.  --------------------------------------------------------------------------------------------------  Cardiovascular History & Procedures: Cardiovascular Problems:  Coronary artery disease status post CABG (1994)  Carotid artery stenosis status post left carotid endarterectomy (2004)  Abdominal aortic aneurysm  Risk Factors:  Known coronary artery disease, hypertension, hyperlipidemia, diabetes mellitus, male gender, and age  Cath/PCI:  LHC (02/12/16): Severe native disease with chronic total occlusions of the ostial LAD and LCx as well as 99% stenosis of distal LMCA.  80% ostial RCA stenosis as well as diffuse mid RCA with tandem lesions of 85-99%.  Patent LIMA to LAD and SVG to distal RCA.  Additional SVG to unknown vessel is chronically occluded.  CV Surgery:  CABG (1994, [place], [place]): LIMA to LAD, SVG to distal RCA, and SVG to other unknown vessel.  Left carotid endarterectomy (2004)  EP Procedures and Devices:  None  Non-Invasive Evaluation(s):  Carotid artery Doppler (09/14/15): Moderate plaque in the right ICA with less than 50% stenosis.  Pharmacologic MPI (12/16/13): Mild inferior ischemia with LVEF of 58%.  TTE (12/02/13): Mild LVH  with LVEF of 65% and grade 1 diastolic dysfunction.  Aortic sclerosis, mild TR, and moderate pulmonary hypertension.  Question PFO.  Aortoiliac ultrasound (08/09/13): No evidence of AAA.  Moderately elevated right common iliac artery velocity with 50-75% stenosis.  Recent CV  Pertinent Labs: No results found for: CHOL, HDL, LDLCALC, LDLDIRECT, TRIG, CHOLHDL, INR, BNP, K, MG, BUN, CREATININE  --------------------------------------------------------------------------------------------------  Past Medical History:  Diagnosis Date  . Anemia   . Carotid artery occlusion    LEFT  . CKD (chronic kidney disease) stage 3, GFR 30-59 ml/min (HCC)   . Coronary artery disease   . Diabetes mellitus without complication ([place])   . Dyslipidemia   . GERD (gastroesophageal reflux disease)   . Hemorrhoids   . 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 Hypertension   . Pulmonary emboli ([place])   . Pulmonary embolism ([place])   . Thyroid disease     Past Surgical History:  Procedure Laterality Date  . CAROTID ENDARTERECTOMY Left 2004  . CORONARY ARTERY BYPASS GRAFT  1994   [name]  . TONSILLECTOMY AND ADENOIDECTOMY      Current Meds  Medication Sig  . aspirin 81 MG chewable tablet Chew 81 mg by mouth every evening.  Marland Kitchen atorvastatin (LIPITOR) 40 MG tablet Take 1 tablet (40 mg total) daily by mouth.  Marland Kitchen CALCIUM PO Take by mouth.  . carvedilol (COREG) 6.25 MG tablet Take 1 tablet (6.25 mg total) 2 (two) times daily with a meal by mouth.  . clopidogrel (PLAVIX) 75 MG tablet Take 1 tablet (75 mg total) daily by mouth.  Marland Kitchen epoetin alfa (EPOGEN,PROCRIT) 79892 UNIT/ML injection 10,000 Units. Every 2 to 4 weeks  . furosemide (LASIX) 40 MG tablet Take 40 mg by mouth daily.  Marland Kitchen glimepiride (AMARYL) 4 MG tablet Take 1 tablet (4 mg total) daily with breakfast by mouth.  . Hydrocortisone (PROCTOSOL HC RE) Place rectally as needed.  . Insulin Detemir (LEVEMIR) 100 UNIT/ML Pen Inject 14 Units into the skin daily at 10 pm.  . IRON PO Take by mouth 3 (three) times daily.  Marland Kitchen levothyroxine (SYNTHROID, LEVOTHROID) 150 MCG tablet Take 1 tablet by mouth every morning on an empty stomach.  . losartan (COZAAR) 25 MG tablet Take 25 mg by mouth daily.  . Multiple Vitamin (MULTIVITAMIN)  capsule Take 1 capsule by mouth daily.  . Omega-3 Fatty Acids (FISH OIL PO) Take 1,200 mg by mouth 4 (four) times daily.  . pantoprazole (PROTONIX) 20 MG tablet Take 1 tablet (20 mg total) by mouth daily.    Allergies: Heparin  Social History   Socioeconomic History  . Marital status: Widowed    Spouse name: Not on file  . Number of children: Not on file  . Years of education: Not on file  . Highest education level: Not on file  Social Needs  . Financial resource strain: Not on file  . Food insecurity - worry: Not on file  . Food insecurity - inability: Not on file  . Transportation needs - medical: Not on file  . Transportation needs - non-medical: Not on file  Occupational History  . Not on file  Tobacco Use  . Smoking status: Former Smoker    Packs/day: 1.50    Years: 25.00    Pack years: 37.50    Types: Cigarettes    Last attempt to quit: 1978    Years since quitting: 40.9  . Smokeless tobacco: Never Used  Substance and Sexual Activity  . Alcohol use: Yes  Alcohol/week: 0.6 oz    Types: 1 Glasses of wine per week  . Drug use: No  . Sexual activity: Not on file  Other Topics Concern  . Not on file  Social History Narrative   Widower.   4 children, 7 grandchildren, 1 great grandchild.   Retired. Once worked as an account.    Enjoys exercising, spending time with family.     Family History  Problem Relation Age of Onset  . Heart attack Mother   . Heart disease Mother   . Stroke Mother   . Diabetes Father     Review of Systems: A 12-system review of systems was performed and was negative except as noted in the HPI.  --------------------------------------------------------------------------------------------------  Physical Exam: BP 140/60 (BP Location: Left Arm, Patient Position: Sitting, Cuff Size: Normal)   Pulse 61   Ht 5\' 9"  (1.753 m)   Wt 170 lb (77.1 kg)   BMI 25.10 kg/m   General: Well-developed, well-nourished elderly man, seated  comfortably in the exam room. HEENT: No conjunctival pallor or scleral icterus. Moist mucous membranes. OP clear. Neck: Supple without lymphadenopathy, thyromegaly, JVD, or HJR. Lungs: Normal work of breathing. Clear to auscultation bilaterally without wheezes or crackles. Heart: Regular rate and rhythm with 2/6 systolic crescendo-decrescendo murmur loudest at the right upper sternal border.  No rubs or gallops.  Nondisplaced PMI.  Well-healed median sternotomy incision. Abd: Bowel sounds present. Soft, NT/ND without hepatosplenomegaly Ext: No lower extremity edema.  2+ radial pulses.  1+ pedal pulses bilaterally. Skin: Warm and dry without rash. Neuro: CNIII-XII intact. Strength and fine-touch sensation intact in upper and lower extremities bilaterally. Psych: Normal mood and affect.  EKG: Normal sinus rhythm with first-degree AV block, left axis deviation, and right bundle branch block.  No results found for: WBC, HGB, HCT, MCV, PLT  No results found for: NA, K, CL, CO2, BUN, CREATININE, GLUCOSE, ALT  No results found for: CHOL, HDL, LDLCALC, LDLDIRECT, TRIG, CHOLHDL   --------------------------------------------------------------------------------------------------  ASSESSMENT AND PLAN: Coronary artery disease with atypical angina Mr. Saleeby has experienced intermittent chest pain that awakens him at night over the last 2 weeks.  He does not have any exertional chest pain though interestingly his discomfort improves promptly with sublingual nitroglycerin.  Cardiac catheterization last year in [place] showed severe native CAD with patent LIMA to LAD and SVG to distal RCA.  We have discussed further evaluation/treatment options, including catheterization, myocardial perfusion stress test, and medical therapy.  We will obtain a transthoracic echocardiogram to reevaluate LV function and initiate ranolazine 500 mg twice daily.  Of note, Mr. Picklesimer experienced diarrhea with isosorbide mononitrate in  the past.  I will not make any changes to his other medications, including aspirin and clopidogrel.  Mr. Weil should continue on pantoprazole for possible component of GERD.  Aortic sclerosis Systolic murmur heard on exam today.  Echo in 2015 commented on aortic sclerosis.  We will repeat echocardiogram at Mr. Hylton his convenience.  Carotid artery stenosis No focal neurologic symptoms.  Mr. Ang is status post left CEA.  He was noted to have less than 50% stenosis in the right ICA in 2017.  We will repeat a carotid Doppler at Mr. Newey his convenience.  Hyperlipidemia Continue high intensity statin therapy.  I will defer repeating a lipid panel to Ms. Clark.  Of note, goal LDL is less than 70.  Hypertension Blood pressure remains suboptimally controlled, though Mr. Vignola was just started on losartan by Dr. Holley Raring (nephrology) within  the last week.  Further uptitration of losartan should be considered for a target blood pressure less than 130/80; I will defer this to Dr. Holley Raring and Ms. Clark.  Chronic kidney disease Continue follow-up with nephrology.  Follow-up: Return to clinic in 1 month.  Nelva Bush, MD 02/04/2017 8:22 PM

## 2017-02-04 NOTE — Patient Instructions (Addendum)
Medication Instructions:  Your physician has recommended you make the following change in your medication:  1- START Ranexa 500 mg (1 tablet) by mouth two times a day. 2- Nitro 0.4 mg (1 tablet) under the tongue every 5 minutes for maximum of 3 times as needed for chest pain.   Labwork: none  Testing/Procedures: Your physician has requested that you have an echocardiogram. Echocardiography is a painless test that uses sound waves to create images of your heart. It provides your doctor with information about the size and shape of your heart and how well your heart's chambers and valves are working. This procedure takes approximately one hour. There are no restrictions for this procedure.   Your physician has requested that you have a carotid duplex. This test is an ultrasound of the carotid arteries in your neck. It looks at blood flow through these arteries that supply the brain with blood. Allow one hour for this exam. There are no restrictions or special instructions.    Follow-Up: Your physician recommends that you schedule a follow-up appointment in: [place].   If you need a refill on your cardiac medications before your next appointment, please call your pharmacy.   Echocardiogram An echocardiogram, or echocardiography, uses sound waves (ultrasound) to produce an image of your heart. The echocardiogram is simple, painless, obtained within a short period of time, and offers valuable information to your health care provider. The images from an echocardiogram can provide information such as:  Evidence of coronary artery disease (CAD).  Heart size.  Heart muscle function.  Heart valve function.  Aneurysm detection.  Evidence of a past heart attack.  Fluid buildup around the heart.  Heart muscle thickening.  Assess heart valve function.  Tell a health care provider about:  Any allergies you have.  All medicines you are taking, including vitamins, herbs,  eye drops, creams, and over-the-counter medicines.  Any problems you or family members have had with anesthetic medicines.  Any blood disorders you have.  Any surgeries you have had.  Any medical conditions you have.  Whether you are pregnant or may be pregnant. What happens before the procedure? No special preparation is needed. Eat and drink normally. What happens during the procedure?  In order to produce an image of your heart, gel will be applied to your chest and a wand-like tool (transducer) will be moved over your chest. The gel will help transmit the sound waves from the transducer. The sound waves will harmlessly bounce off your heart to allow the heart images to be captured in real-time motion. These images will then be recorded.  You may need an IV to receive a medicine that improves the quality of the pictures. What happens after the procedure? You may return to your normal schedule including diet, activities, and medicines, unless your health care provider tells you otherwise. This information is not intended to replace advice given to you by your health care provider. Make sure you discuss any questions you have with your health care provider. Document Released: 02/16/2000 Document Revised: 10/07/2015 Document Reviewed: 10/26/2012 Elsevier Interactive Patient Education  2017 [place].   Medication Samples have been provided to the patient.  Drug name: Ranexa       Strength: 500 mg        Qty: 3 boxes  LOT: [id number]  Exp.Date: 11/2018

## 2017-02-05 ENCOUNTER — Other Ambulatory Visit: Payer: Self-pay | Admitting: Internal Medicine

## 2017-02-05 DIAGNOSIS — I779 Disorder of arteries and arterioles, unspecified: Secondary | ICD-10-CM

## 2017-02-05 DIAGNOSIS — I739 Peripheral vascular disease, unspecified: Secondary | ICD-10-CM

## 2017-02-05 DIAGNOSIS — Z9889 Other specified postprocedural states: Secondary | ICD-10-CM

## 2017-02-05 DIAGNOSIS — I25119 Atherosclerotic heart disease of native coronary artery with unspecified angina pectoris: Secondary | ICD-10-CM

## 2017-02-05 DIAGNOSIS — I358 Other nonrheumatic aortic valve disorders: Secondary | ICD-10-CM

## 2017-02-06 ENCOUNTER — Ambulatory Visit (INDEPENDENT_AMBULATORY_CARE_PROVIDER_SITE_OTHER): Payer: Medicare Other

## 2017-02-06 ENCOUNTER — Other Ambulatory Visit: Payer: Self-pay

## 2017-02-06 DIAGNOSIS — I358 Other nonrheumatic aortic valve disorders: Secondary | ICD-10-CM | POA: Diagnosis not present

## 2017-02-06 DIAGNOSIS — Z9889 Other specified postprocedural states: Secondary | ICD-10-CM

## 2017-02-06 DIAGNOSIS — I779 Disorder of arteries and arterioles, unspecified: Secondary | ICD-10-CM | POA: Diagnosis not present

## 2017-02-06 DIAGNOSIS — I739 Peripheral vascular disease, unspecified: Principal | ICD-10-CM

## 2017-02-06 DIAGNOSIS — I25119 Atherosclerotic heart disease of native coronary artery with unspecified angina pectoris: Secondary | ICD-10-CM | POA: Diagnosis not present

## 2017-02-07 ENCOUNTER — Other Ambulatory Visit: Payer: Self-pay | Admitting: Primary Care

## 2017-02-07 DIAGNOSIS — I2510 Atherosclerotic heart disease of native coronary artery without angina pectoris: Secondary | ICD-10-CM

## 2017-02-07 LAB — VAS US CAROTID
LEFT ECA DIAS: 22 cm/s
LEFT VERTEBRAL DIAS: 14 cm/s
Left CCA dist dias: 16 cm/s
Left CCA dist sys: 98 cm/s
Left CCA prox dias: 19 cm/s
Left CCA prox sys: 96 cm/s
Left ICA dist dias: -16 cm/s
Left ICA dist sys: -100 cm/s
Left ICA prox dias: -18 cm/s
Left ICA prox sys: -122 cm/s
RIGHT VERTEBRAL DIAS: -17 cm/s
Right CCA prox dias: -15 cm/s
Right CCA prox sys: -108 cm/s
Right cca dist sys: -113 cm/s

## 2017-02-07 NOTE — Telephone Encounter (Signed)
Received faxed refill request for furosemide (LASIX) 40 MG tablet  Medication have not been prescribed by Anda Kraft. Last seen on 12/09/2016

## 2017-02-08 MED ORDER — FUROSEMIDE 40 MG PO TABS: 40.0000 mg | ORAL_TABLET | Freq: Every day | ORAL | 0 refills | Status: DC

## 2017-02-08 NOTE — Telephone Encounter (Signed)
Will refill, based off of recent cardiology visit no medications were changed. Looks like transthoracic echo pending. Echo from 2015 with mild left atrial dilation.

## 2017-02-11 NOTE — Telephone Encounter (Signed)
Pt calling to check on refill status

## 2017-02-12 DIAGNOSIS — N183 Chronic kidney disease, stage 3 (moderate): Secondary | ICD-10-CM | POA: Diagnosis not present

## 2017-02-12 DIAGNOSIS — H40052 Ocular hypertension, left eye: Secondary | ICD-10-CM | POA: Diagnosis not present

## 2017-02-12 MED ORDER — INSULIN PEN NEEDLE 29G X 12.7MM MISC: 5 refills | Status: DC

## 2017-02-12 MED ORDER — INSULIN PEN NEEDLE 32G X 8 MM MISC: 2 refills | Status: DC

## 2017-02-12 NOTE — Addendum Note (Signed)
Addended by: Jacqualin Combes on: 02/12/2017 03:56 PM   Modules accepted: Orders

## 2017-02-12 NOTE — Telephone Encounter (Signed)
Patient was notified and patient requested pen needles were send to [facility] as requested.

## 2017-02-18 ENCOUNTER — Other Ambulatory Visit: Payer: Self-pay | Admitting: Primary Care

## 2017-02-18 MED ORDER — INSULIN PEN NEEDLE 29G X 12.7MM MISC: 1 refills | Status: AC

## 2017-02-20 DIAGNOSIS — M47812 Spondylosis without myelopathy or radiculopathy, cervical region: Secondary | ICD-10-CM | POA: Diagnosis not present

## 2017-02-20 DIAGNOSIS — M4312 Spondylolisthesis, cervical region: Secondary | ICD-10-CM | POA: Diagnosis not present

## 2017-02-20 DIAGNOSIS — S0121XA Laceration without foreign body of nose, initial encounter: Secondary | ICD-10-CM | POA: Diagnosis not present

## 2017-02-20 DIAGNOSIS — S0990XA Unspecified injury of head, initial encounter: Secondary | ICD-10-CM | POA: Diagnosis not present

## 2017-02-20 DIAGNOSIS — Z7982 Long term (current) use of aspirin: Secondary | ICD-10-CM | POA: Diagnosis not present

## 2017-02-20 DIAGNOSIS — W1830XA Fall on same level, unspecified, initial encounter: Secondary | ICD-10-CM | POA: Diagnosis not present

## 2017-02-20 DIAGNOSIS — S0081XA Abrasion of other part of head, initial encounter: Secondary | ICD-10-CM | POA: Diagnosis not present

## 2017-02-20 DIAGNOSIS — G319 Degenerative disease of nervous system, unspecified: Secondary | ICD-10-CM | POA: Diagnosis not present

## 2017-02-20 DIAGNOSIS — Z743 Need for continuous supervision: Secondary | ICD-10-CM | POA: Diagnosis not present

## 2017-02-20 DIAGNOSIS — Z7902 Long term (current) use of antithrombotics/antiplatelets: Secondary | ICD-10-CM | POA: Diagnosis not present

## 2017-02-20 DIAGNOSIS — Z23 Encounter for immunization: Secondary | ICD-10-CM | POA: Diagnosis not present

## 2017-02-20 DIAGNOSIS — I6523 Occlusion and stenosis of bilateral carotid arteries: Secondary | ICD-10-CM | POA: Diagnosis not present

## 2017-02-20 DIAGNOSIS — S0181XA Laceration without foreign body of other part of head, initial encounter: Secondary | ICD-10-CM | POA: Diagnosis not present

## 2017-02-20 DIAGNOSIS — S199XXA Unspecified injury of neck, initial encounter: Secondary | ICD-10-CM | POA: Diagnosis not present

## 2017-03-14 ENCOUNTER — Other Ambulatory Visit: Payer: Self-pay | Admitting: Primary Care

## 2017-03-14 DIAGNOSIS — Z794 Long term (current) use of insulin: Secondary | ICD-10-CM

## 2017-03-14 DIAGNOSIS — E118 Type 2 diabetes mellitus with unspecified complications: Secondary | ICD-10-CM

## 2017-03-14 DIAGNOSIS — E039 Hypothyroidism, unspecified: Secondary | ICD-10-CM

## 2017-03-16 ENCOUNTER — Other Ambulatory Visit: Payer: Self-pay | Admitting: Primary Care

## 2017-03-16 DIAGNOSIS — K219 Gastro-esophageal reflux disease without esophagitis: Secondary | ICD-10-CM

## 2017-03-17 DIAGNOSIS — H40052 Ocular hypertension, left eye: Secondary | ICD-10-CM | POA: Diagnosis not present

## 2017-03-19 ENCOUNTER — Ambulatory Visit (INDEPENDENT_AMBULATORY_CARE_PROVIDER_SITE_OTHER): Payer: Medicare Other | Admitting: Internal Medicine

## 2017-03-19 ENCOUNTER — Other Ambulatory Visit
Admission: RE | Admit: 2017-03-19 | Discharge: 2017-03-19 | Disposition: A | Discharge status: Home / Self Care | Payer: Medicare Other | Source: Ambulatory Visit | Attending: Internal Medicine | Admitting: Internal Medicine

## 2017-03-19 ENCOUNTER — Encounter: Payer: Self-pay | Admitting: Internal Medicine

## 2017-03-19 VITALS — BP 160/60 | HR 60 | Ht 70.0 in | Wt 167.0 lb

## 2017-03-19 DIAGNOSIS — I2510 Atherosclerotic heart disease of native coronary artery without angina pectoris: Secondary | ICD-10-CM | POA: Diagnosis not present

## 2017-03-19 DIAGNOSIS — I25118 Atherosclerotic heart disease of native coronary artery with other forms of angina pectoris: Secondary | ICD-10-CM | POA: Diagnosis not present

## 2017-03-19 DIAGNOSIS — Z79899 Other long term (current) drug therapy: Secondary | ICD-10-CM

## 2017-03-19 DIAGNOSIS — I6523 Occlusion and stenosis of bilateral carotid arteries: Secondary | ICD-10-CM

## 2017-03-19 DIAGNOSIS — N183 Chronic kidney disease, stage 3 unspecified: Secondary | ICD-10-CM

## 2017-03-19 DIAGNOSIS — I10 Essential (primary) hypertension: Secondary | ICD-10-CM

## 2017-03-19 DIAGNOSIS — E785 Hyperlipidemia, unspecified: Secondary | ICD-10-CM | POA: Diagnosis not present

## 2017-03-19 LAB — BASIC METABOLIC PANEL
Anion gap: 8 (ref 5–15)
BUN: 35 mg/dL — ABNORMAL HIGH (ref 6–20)
CO2: 27 mmol/L (ref 22–32)
Calcium: 9.3 mg/dL (ref 8.9–10.3)
Chloride: 102 mmol/L (ref 101–111)
Creatinine, Ser: 1.78 mg/dL — ABNORMAL HIGH (ref 0.61–1.24)
GFR calc Af Amer: 38 mL/min — ABNORMAL LOW (ref >60)
GFR calc non Af Amer: 32 mL/min — ABNORMAL LOW (ref >60)
Glucose, Bld: 79 mg/dL (ref 65–99)
Potassium: 4.6 mmol/L (ref 3.5–5.1)
Sodium: 137 mmol/L (ref 135–145)

## 2017-03-19 MED ORDER — LOSARTAN POTASSIUM 50 MG PO TABS: 50.0000 mg | ORAL_TABLET | Freq: Every day | ORAL | 3 refills | Status: DC

## 2017-03-19 NOTE — Progress Notes (Signed)
 Follow-up Outpatient Visit Date: 03/19/2017  Primary Care Provider: Clark, Katherine K, NP [address] [place] [address]  Chief Complaint: Shortness of breath  HPI:  Tommy [name] is a 84 y.o. year-old male with history of coronary artery disease status post remote CABG, peripheral vascular disease, carotid artery stenosis status post left endarterectomy, hypertension, hyperlipidemia, and type 2 diabetes mellitus, who presents for follow-up of shortness of breath in the setting of coronary artery disease.  I met him for the first time a month ago after he had moved to [place] from [place].  At that time, he noted a few episodes of chest pain that he attributed to over exerting himself.  We agreed to add ranolazine 500 mg twice daily and obtain a transthoracic echocardiogram as well as carotid Dopplers..  Echo showed preserved LVEF without any significant valvular abnormalities.  Mr. Cutting is tolerating ranolazine well and feels like his chest pain may be slightly better.  It still occurs from time to time, though he has not needed to take any sublingual nitroglycerin.  The chest pain now often lasts only a few seconds at a time.  Mr. Dail is concerned that he has more exertional dyspnea.  He wonders if it was related to timolol eyedrops, which were started after our last visit.  He stopped taking this medicine today.  Mr. Maggard denies orthopnea, PND, edema, and palpitations.  Blood pressures have been elevated at home despite initiation of losartan by his nephrologist a month or 2 ago.  He is tolerating the medication well and reports that follow-up labs showed stable potassium and renal function.  --------------------------------------------------------------------------------------------------  Cardiovascular History & Procedures: Cardiovascular Problems:  Coronary artery disease status post CABG (1994)  Carotid artery stenosis status post left carotid endarterectomy  (2004)  Abdominal aortic aneurysm  Risk Factors:  Known coronary artery disease, hypertension, hyperlipidemia, diabetes mellitus, male gender, and age  Cath/PCI:  LHC (02/12/16): Severe native disease with chronic total occlusions of the ostial LAD and LCx as well as 99% stenosis of distal LMCA.  80% ostial RCA stenosis as well as diffuse mid RCA with tandem lesions of 85-99%.  Patent LIMA to LAD and SVG to distal RCA.  Additional SVG to unknown vessel is chronically occluded.  CV Surgery:  CABG (1994, [place], [place]): LIMA to LAD, SVG to distal RCA, and SVG to other unknown vessel.  Left carotid endarterectomy (2004)  EP Procedures and Devices:  None  Non-Invasive Evaluation(s):  TTE (02/06/17): Normal LV size.  LVEF 55-60% with normal wall motion.  Normal diastolic function.  Aortic sclerosis without stenosis.  Mild MR.  Mild left atrial enlargement.  Moderate TR.  Upper normal to mildly elevated pulmonary artery pressure.  Carotid Doppler (02/06/17): Mild narrowing of bilateral internal carotid arteries (less than 40%.  Less than 50% stenosis involving the right common carotid artery.  Antegrade vertebral artery flow bilaterally.  Normal flow in the subclavian arteries bilaterally.  Carotid artery Doppler (09/14/15): Moderate plaque in the right ICA with less than 50% stenosis.  Pharmacologic MPI (12/16/13): Mild inferior ischemia with LVEF of 58%.  TTE (12/02/13): Mild LVH with LVEF of 65% and grade 1 diastolic dysfunction.  Aortic sclerosis, mild TR, and moderate pulmonary hypertension.  Question PFO.  Aortoiliac ultrasound (08/09/13): No evidence of AAA.  Moderately elevated right common iliac artery velocity with 50-75% stenosis.  Recent CV Pertinent Labs: Lab Results  Component Value Date   K 4.6 03/19/2017   BUN 35 (H)   03/19/2017   CREATININE 1.78 (H) 03/19/2017    Past medical and surgical history were reviewed and updated in EPIC.  Current Meds  Medication  Sig  . aspirin 81 MG chewable tablet Chew 81 mg by mouth every evening.  Marland Kitchen atorvastatin (LIPITOR) 40 MG tablet Take 1 tablet (40 mg total) daily by mouth.  Marland Kitchen CALCIUM PO Take by mouth.  . carvedilol (COREG) 6.25 MG tablet Take 1 tablet (6.25 mg total) 2 (two) times daily with a meal by mouth.  . clopidogrel (PLAVIX) 75 MG tablet Take 1 tablet (75 mg total) daily by mouth.  Marland Kitchen epoetin alfa (EPOGEN,PROCRIT) 68115 UNIT/ML injection 10,000 Units. Every 2 to 4 weeks  . furosemide (LASIX) 40 MG tablet Take 1 tablet (40 mg total) by mouth daily.  Marland Kitchen glimepiride (AMARYL) 4 MG tablet TAKE 1 TABLET BY MOUTH  DAILY WITH BREAKFAST  . Hydrocortisone (PROCTOSOL HC RE) Place rectally as needed.  . Insulin Detemir (LEVEMIR) 100 UNIT/ML Pen Inject 14 Units into the skin daily at 10 pm.  . Insulin Pen Needle 29G X 12.7MM MISC BD Ultrafine Pen Needle Use as instructed to inject insulin daily  . IRON PO Take by mouth 3 (three) times daily.  Marland Kitchen levothyroxine (SYNTHROID, LEVOTHROID) 150 MCG tablet TAKE 1 TABLET BY MOUTH  EVERY MORNING ON AN EMPTY  STOMACH  . Multiple Vitamin (MULTIVITAMIN) capsule Take 1 capsule by mouth daily.  . nitroGLYCERIN (NITROSTAT) 0.4 MG SL tablet Place 1 tablet (0.4 mg total) under the tongue every 5 (five) minutes as needed for chest pain.  . Omega-3 Fatty Acids (FISH OIL PO) Take 1,200 mg by mouth 4 (four) times daily.  . pantoprazole (PROTONIX) 20 MG tablet TAKE 1 TABLET BY MOUTH  DAILY  . ranolazine (RANEXA) 500 MG 12 hr tablet Take 1 tablet (500 mg total) by mouth 2 (two) times daily.  . [DISCONTINUED] losartan (COZAAR) 25 MG tablet Take 25 mg by mouth daily.    Allergies: Heparin  Social History   Socioeconomic History  . Marital status: Widowed    Spouse name: Not on file  . Number of children: Not on file  . Years of education: Not on file  . Highest education level: Not on file  Social Needs  . Financial resource strain: Not on file  . Food insecurity - worry: Not on file   . Food insecurity - inability: Not on file  . Transportation needs - medical: Not on file  . Transportation needs - non-medical: Not on file  Occupational History  . Not on file  Tobacco Use  . Smoking status: Former Smoker    Packs/day: 1.50    Years: 25.00    Pack years: 37.50    Types: Cigarettes    Last attempt to quit: 1978    Years since quitting: 41.0  . Smokeless tobacco: Never Used  Substance and Sexual Activity  . Alcohol use: Yes    Alcohol/week: 0.6 oz    Types: 1 Glasses of wine per week  . Drug use: No  . Sexual activity: Not on file  Other Topics Concern  . Not on file  Social History Narrative   Widower.   4 children, 7 grandchildren, 1 great grandchild.   Retired. Once worked as an account.    Enjoys exercising, spending time with family.     Family History  Problem Relation Age of Onset  . Heart attack Mother   . Heart disease Mother   . Stroke Mother   .  Diabetes Father     Review of Systems: A 12-system review of systems was performed and was negative except as noted in the HPI.  --------------------------------------------------------------------------------------------------  Physical Exam: BP (!) 160/60 (BP Location: Left Arm, Patient Position: Sitting, Cuff Size: Normal)   Pulse 60   Ht 5' 10" (1.778 m)   Wt 167 lb (75.8 kg)   BMI 23.96 kg/m   General: Well-developed, well-nourished elderly man seated comfortably in the exam room. HEENT: No conjunctival pallor or scleral icterus. Moist mucous membranes.  OP clear. Neck: Supple without lymphadenopathy, thyromegaly, JVD, or HJR. Lungs: Normal work of breathing. Clear to auscultation bilaterally without wheezes or crackles. Heart: Regular rate and rhythm with 2/6 systolic murmur loudest at the left lower sternal border.  No rubs or gallops.. Non-displaced PMI. Abd: Bowel sounds present. Soft, NT/ND without hepatosplenomegaly Ext: No lower extremity edema. Radial, PT, and DP pulses are 2+  bilaterally. Skin: Warm and dry without rash.  EKG: Normal sinus rhythm with first-degree AV block (PR interval 238 ms) and right bundle branch block.  No significant change since 02/04/17.  No results found for: WBC, HGB, HCT, MCV, PLT  Lab Results  Component Value Date   NA 137 03/19/2017   K 4.6 03/19/2017   CL 102 03/19/2017   CO2 27 03/19/2017   BUN 35 (H) 03/19/2017   CREATININE 1.78 (H) 03/19/2017   GLUCOSE 79 03/19/2017    No results found for: CHOL, HDL, LDLCALC, LDLDIRECT, TRIG, CHOLHDL  --------------------------------------------------------------------------------------------------  ASSESSMENT AND PLAN: Coronary artery disease with stable angina Chest pain is stable to improved with addition of ranolazine.  It is possible that increased exertional dyspnea may also reflect an anginal equivalent.  I am hesitant to escalate ranolazine further given underlying chronic kidney disease.  We discussed ischemia evaluation versus medical optimization and have agreed to defer stress testing and catheterization for the time being.  I will not make any changes to Mr. Helle, which may be contributing to his increased dyspnea antiplatelet or antianginal therapy today.  Hypertension Blood pressure is suboptimally controlled today, which may be contributing to his increased dyspnea.  He appears euvolemic on exam with slightly decreased weight over the last 5 weeks.  We have agreed to increase losartan to 50 mg daily.  I will check a basic metabolic panel today.  A BMP should be repeated as well when Mr. Burnley follows up with his PCP next week to ensure stable potassium and renal function.  Chronic kidney disease stage III We will increase losartan, as above.  Continue follow-up with his PCP and nephrologist.  Carotid artery disease Mild carotid artery stenosis bilaterally status post left carotid endarterectomy.  No further testing or intervention at this time other than continuation of  secondary prevention.  Hyperlipidemia Goal LDL is less than 70.  Continue atorvastatin 40 mg daily with follow-up labs per Mr. Talent his PCP.  Follow-up: Return to clinic in 3 months.  Nelva Bush, MD 03/20/2017 8:01 PM

## 2017-03-19 NOTE — Patient Instructions (Signed)
Medication Instructions:  Your physician has recommended you make the following change in your medication:  1- INCREASE Losartan to 50 mg by  Mouth once a day.   Labwork: Your physician recommends that you return for lab work in: [place] ([facility]). - Please go to the [facility]. You will check in at the front desk to the right as you walk into the atrium. Valet Parking is offered if needed.    Testing/Procedures: none  Follow-Up: Your physician recommends that you schedule a follow-up appointment in: 3 MONTHS WITH DR END.   If you need a refill on your cardiac medications before your next appointment, please call your pharmacy.

## 2017-03-20 ENCOUNTER — Encounter: Payer: Self-pay | Admitting: Internal Medicine

## 2017-03-25 ENCOUNTER — Ambulatory Visit (INDEPENDENT_AMBULATORY_CARE_PROVIDER_SITE_OTHER): Payer: Medicare Other | Admitting: Primary Care

## 2017-03-25 VITALS — BP 160/70 | HR 72 | Temp 98.2°F | Ht 69.25 in | Wt 167.1 lb

## 2017-03-25 DIAGNOSIS — E039 Hypothyroidism, unspecified: Secondary | ICD-10-CM | POA: Diagnosis not present

## 2017-03-25 DIAGNOSIS — E118 Type 2 diabetes mellitus with unspecified complications: Secondary | ICD-10-CM | POA: Diagnosis not present

## 2017-03-25 DIAGNOSIS — N183 Chronic kidney disease, stage 3 unspecified: Secondary | ICD-10-CM

## 2017-03-25 DIAGNOSIS — Z974 Presence of external hearing-aid: Secondary | ICD-10-CM | POA: Diagnosis not present

## 2017-03-25 DIAGNOSIS — I6523 Occlusion and stenosis of bilateral carotid arteries: Secondary | ICD-10-CM

## 2017-03-25 DIAGNOSIS — Z794 Long term (current) use of insulin: Secondary | ICD-10-CM

## 2017-03-25 DIAGNOSIS — E785 Hyperlipidemia, unspecified: Secondary | ICD-10-CM | POA: Diagnosis not present

## 2017-03-25 DIAGNOSIS — I10 Essential (primary) hypertension: Secondary | ICD-10-CM | POA: Diagnosis not present

## 2017-03-25 LAB — COMPREHENSIVE METABOLIC PANEL
ALT: 20 U/L (ref 0–53)
AST: 23 U/L (ref 0–37)
Albumin: 4.1 g/dL (ref 3.5–5.2)
Alkaline Phosphatase: 71 U/L (ref 39–117)
BUN: 34 mg/dL — ABNORMAL HIGH (ref 6–23)
CO2: 32 mEq/L (ref 19–32)
Calcium: 9.5 mg/dL (ref 8.4–10.5)
Chloride: 102 mEq/L (ref 96–112)
Creatinine, Ser: 1.73 mg/dL — ABNORMAL HIGH (ref 0.40–1.50)
GFR: 39.79 mL/min — ABNORMAL LOW (ref >60.00)
Glucose, Bld: 119 mg/dL — ABNORMAL HIGH (ref 70–99)
Potassium: 5.5 mEq/L — ABNORMAL HIGH (ref 3.5–5.1)
Sodium: 138 mEq/L (ref 135–145)
Total Bilirubin: 0.4 mg/dL (ref 0.2–1.2)
Total Protein: 7.3 g/dL (ref 6.0–8.3)

## 2017-03-25 LAB — LIPID PANEL
Cholesterol: 132 mg/dL (ref 0–200)
HDL: 42.4 mg/dL (ref >39.00)
LDL Cholesterol: 61 mg/dL (ref 0–99)
NonHDL: 89.69
Total CHOL/HDL Ratio: 3
Triglycerides: 142 mg/dL (ref 0.0–149.0)
VLDL: 28.4 mg/dL (ref 0.0–40.0)

## 2017-03-25 LAB — BASIC METABOLIC PANEL
BUN: 34 mg/dL — ABNORMAL HIGH (ref 6–23)
CO2: 32 mEq/L (ref 19–32)
Calcium: 9.5 mg/dL (ref 8.4–10.5)
Chloride: 102 mEq/L (ref 96–112)
Creatinine, Ser: 1.73 mg/dL — ABNORMAL HIGH (ref 0.40–1.50)
GFR: 39.79 mL/min — ABNORMAL LOW (ref >60.00)
Glucose, Bld: 119 mg/dL — ABNORMAL HIGH (ref 70–99)
Potassium: 5.5 mEq/L — ABNORMAL HIGH (ref 3.5–5.1)
Sodium: 138 mEq/L (ref 135–145)

## 2017-03-25 LAB — HEMOGLOBIN A1C: Hgb A1c MFr Bld: 7.4 % — ABNORMAL HIGH (ref 4.6–6.5)

## 2017-03-25 LAB — TSH: TSH: 7.4 u[IU]/mL — ABNORMAL HIGH (ref 0.35–4.50)

## 2017-03-25 MED ORDER — ONETOUCH SURESOFT LANCING DEV MISC: 0 refills | Status: DC

## 2017-03-25 MED ORDER — GLUCOSE BLOOD VI STRP: ORAL_STRIP | 5 refills | Status: DC

## 2017-03-25 MED ORDER — ONETOUCH SURESOFT LANCING DEV MISC: 2 refills | Status: DC

## 2017-03-25 MED ORDER — INSULIN DETEMIR 100 UNIT/ML FLEXPEN: 14.0000 [IU] | PEN_INJECTOR | Freq: Every day | SUBCUTANEOUS | 5 refills | Status: DC

## 2017-03-25 NOTE — Assessment & Plan Note (Signed)
Repeat lipid panel pending. LDL goal <70.

## 2017-03-25 NOTE — Progress Notes (Signed)
Subjective:    Patient ID: Tommy [name], male    DOB: 1926/10/12, 84 y.o.   MRN: [id number]  HPI  Mr. Kirby is an 84 year old male who presents today for follow up.  1) Type 2 Diabetes:  Current medications include: Glimepiride 4 mg, levemir 14 units once daily.  He is checking his blood glucose once daily fasting and is getting readings of: Low 100's on average.  Lowest reading: 80 this morning, no food yet. Highest reading: 156  Last A1C: 7.1 Last Eye Exam: Completed in October 2018 Pneumonia Vaccination: UTD ACE/ARB: Losartan Statin: Atrovastatin    2) Essential Hypertension: Currently managed on Losartan 50 mg (increased on 03/19/16), carvedilol 6.25 mg BID. He's checking his BP at home and is getting readings of 120-140/50-60's. He's not yet had his medication this morning. He denies dizziness, chest pain. He has noticed a mild improvement in shortness of breath.   BP Readings from Last 3 Encounters:  03/25/17 (!) 160/70  03/19/17 (!) 160/60  02/04/17 140/60    3) Hypothyroidism: Currently managed on levothyroxine 150 mcg. He is due for repeat TSH check today. He is compliant to his levothyroxine.    Review of Systems  Constitutional: Negative for fatigue.  Eyes: Negative for visual disturbance.  Respiratory: Negative for shortness of breath.   Cardiovascular: Negative for chest pain.  Neurological: Negative for dizziness, numbness and headaches.       Past Medical History:  Diagnosis Date  . Anemia   . Carotid artery occlusion    LEFT  . CKD (chronic kidney disease) stage 3, GFR 30-59 ml/min (HCC)   . Coronary artery disease   . Diabetes mellitus without complication ([place])   . Dyslipidemia   . GERD (gastroesophageal reflux disease)   . Hemorrhoids   . 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 Hypertension   . Pulmonary emboli ([place])   . Pulmonary embolism ([place])   . Thyroid disease      Social History   Socioeconomic History  . Marital  status: Widowed    Spouse name: Not on file  . Number of children: Not on file  . Years of education: Not on file  . Highest education level: Not on file  Social Needs  . Financial resource strain: Not on file  . Food insecurity - worry: Not on file  . Food insecurity - inability: Not on file  . Transportation needs - medical: Not on file  . Transportation needs - non-medical: Not on file  Occupational History  . Not on file  Tobacco Use  . Smoking status: Former Smoker    Packs/day: 1.50    Years: 25.00    Pack years: 37.50    Types: Cigarettes    Last attempt to quit: 1978    Years since quitting: 41.0  . Smokeless tobacco: Never Used  Substance and Sexual Activity  . Alcohol use: Yes    Alcohol/week: 0.6 oz    Types: 1 Glasses of wine per week  . Drug use: No  . Sexual activity: Not on file  Other Topics Concern  . Not on file  Social History Narrative   Widower.   4 children, 7 grandchildren, 1 great grandchild.   Retired. Once worked as an account.    Enjoys exercising, spending time with family.     Past Surgical History:  Procedure Laterality Date  . CAROTID ENDARTERECTOMY Left 2004  . CORONARY ARTERY BYPASS GRAFT  1994   [name]  .  TONSILLECTOMY AND ADENOIDECTOMY      Family History  Problem Relation Age of Onset  . Heart attack Mother   . Heart disease Mother   . Stroke Mother   . Diabetes Father     Allergies  Allergen Reactions  . Heparin     Current Outpatient Medications on File Prior to Visit  Medication Sig Dispense Refill  . aspirin 81 MG chewable tablet Chew 81 mg by mouth every evening.    Marland Kitchen atorvastatin (LIPITOR) 40 MG tablet Take 1 tablet (40 mg total) daily by mouth. 90 tablet 3  . CALCIUM PO Take by mouth.    . carvedilol (COREG) 6.25 MG tablet Take 1 tablet (6.25 mg total) 2 (two) times daily with a meal by mouth. 180 tablet 3  . clopidogrel (PLAVIX) 75 MG tablet Take 1 tablet (75 mg total) daily by mouth. 90 tablet 3  .  epoetin alfa (EPOGEN,PROCRIT) 16109 UNIT/ML injection 10,000 Units. Every 2 to 4 weeks    . furosemide (LASIX) 40 MG tablet Take 1 tablet (40 mg total) by mouth daily. 90 tablet 0  . glimepiride (AMARYL) 4 MG tablet TAKE 1 TABLET BY MOUTH  DAILY WITH BREAKFAST 90 tablet 0  . Hydrocortisone (PROCTOSOL HC RE) Place rectally as needed.    . Insulin Pen Needle 29G X 12.7MM MISC BD Ultrafine Pen Needle Use as instructed to inject insulin daily 300 each 1  . IRON PO Take by mouth 3 (three) times daily.    Marland Kitchen levothyroxine (SYNTHROID, LEVOTHROID) 150 MCG tablet TAKE 1 TABLET BY MOUTH  EVERY MORNING ON AN EMPTY  STOMACH 90 tablet 0  . losartan (COZAAR) 50 MG tablet Take 1 tablet (50 mg total) by mouth daily. 90 tablet 3  . Multiple Vitamin (MULTIVITAMIN) capsule Take 1 capsule by mouth daily.    . nitroGLYCERIN (NITROSTAT) 0.4 MG SL tablet Place 1 tablet (0.4 mg total) under the tongue every 5 (five) minutes as needed for chest pain. 35 tablet 6  . Omega-3 Fatty Acids (FISH OIL PO) Take 1,200 mg by mouth 4 (four) times daily.    . pantoprazole (PROTONIX) 20 MG tablet TAKE 1 TABLET BY MOUTH  DAILY 90 tablet 0  . ranolazine (RANEXA) 500 MG 12 hr tablet Take 1 tablet (500 mg total) by mouth 2 (two) times daily. 180 tablet 3  . timolol (TIMOPTIC) 0.5 % ophthalmic solution APPLY 1 DROP(S) IN BOTH EYES ONCE A DAY  11   No current facility-administered medications on file prior to visit.     BP (!) 160/70   Pulse 72   Temp 98.2 F (36.8 C) (Oral)   Ht 5' 9.25" (1.759 m)   Wt 167 lb 1.9 oz (75.8 kg)   SpO2 98%   BMI 24.50 kg/m    Objective:   Physical Exam  Constitutional: He appears well-nourished.  Neck: Neck supple.  Cardiovascular: Normal rate and regular rhythm.  Pulmonary/Chest: Effort normal and breath sounds normal.  Skin: Skin is warm and dry.          Assessment & Plan:

## 2017-03-25 NOTE — Assessment & Plan Note (Signed)
Repeat TSH pending today. Continue levothyroxine at 150 mcg for now.

## 2017-03-25 NOTE — Assessment & Plan Note (Signed)
Above in the office today, however, home readings stable. Continue Losartan at 50 mg and Carvedilol 6.25 mg BID. Repeat BMP pending. He will continue to monitor home readings and report readings at or above 140/90 on a consistent basis.

## 2017-03-25 NOTE — Patient Instructions (Addendum)
Stop by the lab prior to leaving today. I will notify you of your results once received.   You will be contacted regarding your referral to ENT for hearing aids.  Please let us know if you have not been contacted within one week.   Continue to monitor your blood pressure and report readings at or above 140/90.  Follow up in 6 months for re-evaluation or sooner if needed.  It was a pleasure to see you today!

## 2017-03-25 NOTE — Addendum Note (Signed)
Addended by: Jacqualin Combes on: 03/25/2017 12:32 PM   Modules accepted: Orders

## 2017-03-25 NOTE — Assessment & Plan Note (Signed)
Repeat A1C pending today.  Home glucose readings stable. Discussed to notify if he continues to notice readings below 90.  Continue Levemir and glimepiride.  Managed on ARB and statin.

## 2017-03-25 NOTE — Assessment & Plan Note (Signed)
Repeat BMP since increased dose of Losartan.

## 2017-03-26 ENCOUNTER — Encounter: Payer: Self-pay | Admitting: Primary Care

## 2017-03-26 ENCOUNTER — Other Ambulatory Visit: Payer: Self-pay | Admitting: Primary Care

## 2017-03-26 DIAGNOSIS — E875 Hyperkalemia: Secondary | ICD-10-CM

## 2017-03-26 DIAGNOSIS — N183 Chronic kidney disease, stage 3 unspecified: Secondary | ICD-10-CM

## 2017-03-26 DIAGNOSIS — E039 Hypothyroidism, unspecified: Secondary | ICD-10-CM

## 2017-03-27 DIAGNOSIS — D631 Anemia in chronic kidney disease: Secondary | ICD-10-CM | POA: Diagnosis not present

## 2017-03-27 DIAGNOSIS — N183 Chronic kidney disease, stage 3 (moderate): Secondary | ICD-10-CM | POA: Diagnosis not present

## 2017-03-27 MED ORDER — ACCU-CHEK SOFTCLIX LANCETS MISC: 4 refills | Status: DC

## 2017-03-27 MED ORDER — LEVOTHYROXINE SODIUM 175 MCG PO TABS: ORAL_TABLET | ORAL | 0 refills | Status: DC

## 2017-03-27 NOTE — Addendum Note (Signed)
Addended by: Jacqualin Combes on: 03/27/2017 12:27 PM   Modules accepted: Orders

## 2017-04-01 ENCOUNTER — Other Ambulatory Visit: Payer: Medicare Other

## 2017-04-01 ENCOUNTER — Ambulatory Visit (INDEPENDENT_AMBULATORY_CARE_PROVIDER_SITE_OTHER): Payer: Medicare Other

## 2017-04-01 VITALS — BP 156/70 | HR 54 | Temp 97.5°F | Ht 69.0 in | Wt 166.2 lb

## 2017-04-01 DIAGNOSIS — Z23 Encounter for immunization: Secondary | ICD-10-CM | POA: Diagnosis not present

## 2017-04-01 DIAGNOSIS — Z0000 Encounter for general adult medical examination without abnormal findings: Secondary | ICD-10-CM

## 2017-04-01 DIAGNOSIS — N183 Chronic kidney disease, stage 3 unspecified: Secondary | ICD-10-CM

## 2017-04-01 DIAGNOSIS — E875 Hyperkalemia: Secondary | ICD-10-CM

## 2017-04-01 LAB — CBC
HCT: 29.2 % — ABNORMAL LOW (ref 39.0–52.0)
Hemoglobin: 10.1 g/dL — ABNORMAL LOW (ref 13.0–17.0)
MCHC: 34.4 g/dL (ref 30.0–36.0)
MCV: 100.2 fl — ABNORMAL HIGH (ref 78.0–100.0)
Platelets: 155 10*3/uL (ref 150.0–400.0)
RBC: 2.92 Mil/uL — ABNORMAL LOW (ref 4.22–5.81)
RDW: 13.3 % (ref 11.5–15.5)
WBC: 5.9 10*3/uL (ref 4.0–10.5)

## 2017-04-01 LAB — POTASSIUM: Potassium: 4.6 mEq/L (ref 3.5–5.1)

## 2017-04-01 NOTE — Progress Notes (Signed)
I reviewed health advisor's note, was available for consultation, and agree with documentation and plan.  

## 2017-04-01 NOTE — Progress Notes (Signed)
Subjective:   Tommy [name] is a 84 y.o. male who presents for Medicare Annual/Subsequent preventive examination.  Review of Systems:  N/A Cardiac Risk Factors include: advanced age (>70men, >84 women);male gender;diabetes mellitus     Objective:    Vitals: BP (!) 156/70 (BP Location: Left Arm, Patient Position: Sitting, Cuff Size: Normal)   Pulse (!) 54   Temp (!) 97.5 F (36.4 C) (Oral)   Ht 5\' 9"  (1.753 m) Comment: no shoes  Wt 166 lb 4 oz (75.4 kg)   SpO2 97%   BMI 24.55 kg/m   Body mass index is 24.55 kg/m.  Advanced Directives 04/01/2017  Does Patient Have a Medical Advance Directive? Yes  Type of Paramedic of [place];Living will  Copy of [place] in Chart? No - copy requested    Tobacco Social History   Tobacco Use  Smoking Status Former Smoker  . Packs/day: 1.50  . Years: 25.00  . Pack years: 37.50  . Types: Cigarettes  . Last attempt to quit: 1978  . Years since quitting: 41.1  Smokeless Tobacco Never Used     Counseling given: No   Clinical Intake:  Pre-visit preparation completed: Yes  Pain : No/denies pain Pain Score: 0-No pain     Nutritional Status: BMI of 19-24  Normal Nutritional Risks: None Diabetes: Yes CBG done?: No Did pt. bring in CBG monitor from home?: No  How often do you need to have someone help you when you read instructions, pamphlets, or other written materials from your doctor or pharmacy?: 1 - Never What is the last grade level you completed in school?: BS  Interpreter Needed?: No  Comments: pt is a widower and lives with son and daughter-in-law Information entered by :: LPinson, LPN  Past Medical History:  Diagnosis Date  . Anemia   . Carotid artery occlusion    LEFT  . CKD (chronic kidney disease) stage 3, GFR 30-59 ml/min (HCC)   . Coronary artery disease   . Diabetes mellitus without complication ([place])   . Dyslipidemia   . GERD (gastroesophageal reflux  disease)   . Hemorrhoids   . 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 Hypertension   . Pulmonary emboli ([place])   . Pulmonary embolism ([place])   . Thyroid disease    Past Surgical History:  Procedure Laterality Date  . CAROTID ENDARTERECTOMY Left 2004  . CORONARY ARTERY BYPASS GRAFT  1994   [name]  . TONSILLECTOMY AND ADENOIDECTOMY     Family History  Problem Relation Age of Onset  . Heart attack Mother   . Heart disease Mother   . Stroke Mother   . Diabetes Father    Social History   Socioeconomic History  . Marital status: Widowed    Spouse name: None  . Number of children: 4  . Years of education: 16  . Highest education level: Bachelor's degree (e.g., BA, AB, BS)  Social Needs  . Financial resource strain: Not hard at all  . Food insecurity - worry: Never true  . Food insecurity - inability: Never true  . Transportation needs - medical: No  . Transportation needs - non-medical: No  Occupational History  . Occupation: retired  Tobacco Use  . Smoking status: Former Smoker    Packs/day: 1.50    Years: 25.00    Pack years: 37.50    Types: Cigarettes    Last attempt to quit: 1978    Years since quitting: 41.1  .  Smokeless tobacco: Never Used  Substance and Sexual Activity  . Alcohol use: Yes    Alcohol/week: 0.6 oz    Types: 1 Glasses of wine per week  . Drug use: No  . Sexual activity: Not Currently  Other Topics Concern  . None  Social History Narrative   Widower.   4 children, 7 grandchildren, 1 great grandchild.   Retired. Once worked as an account.    Enjoys exercising, spending time with family.     Outpatient Encounter Medications as of 04/01/2017  Medication Sig  . ACCU-CHEK SOFTCLIX LANCETS lancets Use as instruct to test blood sugar once daily  . aspirin 81 MG chewable tablet Chew 81 mg by mouth every evening.  Marland Kitchen atorvastatin (LIPITOR) 40 MG tablet Take 1 tablet (40 mg total) daily by mouth.  Marland Kitchen CALCIUM PO Take by mouth.  . carvedilol  (COREG) 6.25 MG tablet Take 1 tablet (6.25 mg total) 2 (two) times daily with a meal by mouth.  . clopidogrel (PLAVIX) 75 MG tablet Take 1 tablet (75 mg total) daily by mouth.  Marland Kitchen epoetin alfa (EPOGEN,PROCRIT) 35329 UNIT/ML injection 10,000 Units. Every 2 to 4 weeks  . furosemide (LASIX) 40 MG tablet Take 1 tablet (40 mg total) by mouth daily.  Marland Kitchen glimepiride (AMARYL) 4 MG tablet TAKE 1 TABLET BY MOUTH  DAILY WITH BREAKFAST  . glucose blood (ONETOUCH VERIO) test strip Use as instructed to test blood sugar once daily  . Hydrocortisone (PROCTOSOL HC RE) Place rectally as needed.  . Insulin Detemir (LEVEMIR) 100 UNIT/ML Pen Inject 14 Units into the skin daily at 10 pm.  . Insulin Pen Needle 29G X 12.7MM MISC BD Ultrafine Pen Needle Use as instructed to inject insulin daily  . IRON PO Take by mouth 3 (three) times daily.  Marland Kitchen levothyroxine (SYNTHROID, LEVOTHROID) 175 MCG tablet Take 1 tablet by mouth every morning on an empty stomach with a full glass of water.  Marland Kitchen losartan (COZAAR) 50 MG tablet Take 1 tablet (50 mg total) by mouth daily.  . Multiple Vitamin (MULTIVITAMIN) capsule Take 1 capsule by mouth daily.  . nitroGLYCERIN (NITROSTAT) 0.4 MG SL tablet Place 1 tablet (0.4 mg total) under the tongue every 5 (five) minutes as needed for chest pain.  . Omega-3 Fatty Acids (FISH OIL PO) Take 1,200 mg by mouth 4 (four) times daily.  . pantoprazole (PROTONIX) 20 MG tablet TAKE 1 TABLET BY MOUTH  DAILY  . ranolazine (RANEXA) 500 MG 12 hr tablet Take 1 tablet (500 mg total) by mouth 2 (two) times daily.  . timolol (TIMOPTIC) 0.5 % ophthalmic solution APPLY 1 DROP(S) IN BOTH EYES ONCE A DAY   No facility-administered encounter medications on file as of 04/01/2017.     Activities of Daily Living In your present state of health, do you have any difficulty performing the following activities: 04/01/2017  Hearing? Y  Vision? N  Difficulty concentrating or making decisions? N  Walking or climbing stairs? N    Dressing or bathing? N  Doing errands, shopping? N  Preparing Food and eating ? N  Using the Toilet? N  In the past six months, have you accidently leaked urine? N  Do you have problems with loss of bowel control? N  Managing your Medications? N  Managing your Finances? N  Housekeeping or managing your Housekeeping? N  Some recent data might be hidden    Patient Care Team: Pleas Koch, NP as PCP - General (Internal Medicine) Dingeldein, Remo Lipps, MD  as Referring Physician (Ophthalmology) End, Harrell Gave, MD as Consulting Physician (Cardiology)   Assessment:   This is a routine wellness examination for Walker.  Hearing Screening Comments: Bilateral hearing aids Vision Screening Comments: Last vision exam in Dec 2018 with Dr. Sandra Cockayne   Exercise Activities and Dietary recommendations Current Exercise Habits: Home exercise routine, Type of exercise: strength training/weights;treadmill, Time (Minutes): > 60, Frequency (Times/Week): 3, Weekly Exercise (Minutes/Week): 0, Intensity: Moderate, Exercise limited by: None identified  Goals    . Increase physical activity     Starting 04/01/2017, I will continue to exercise for 90 minutes 3 days per week.        Fall Risk Fall Risk  04/01/2017  Falls in the past year? Yes  Comment tripped over curb; injury to forehead and nose; medical treatment including stitches  Number falls in past yr: 1  Injury with Fall? Yes   Depression Screen PHQ 2/9 Scores 04/01/2017  PHQ - 2 Score 0  PHQ- 9 Score 0    Cognitive Function MMSE - Mini Mental State Exam 04/01/2017  Orientation to time 5  Orientation to Place 5  Registration 3  Attention/ Calculation 0  Recall 3  Language- name 2 objects 0  Language- repeat 1  Language- follow 3 step command 3  Language- read & follow direction 0  Write a sentence 0  Copy design 0  Total score 20     PLEASE NOTE: A Mini-Cog screen was completed. Maximum score is 20. A value of 0 denotes this  part of [place] MMSE was not completed or the patient failed this part of the Mini-Cog screening.   Mini-Cog Screening Orientation to Time - Max 5 pts Orientation to Place - Max 5 pts Registration - Max 3 pts Recall - Max 3 pts Language Repeat - Max 1 pts Language Follow 3 Step Command - Max 3 pts     Immunization History  Administered Date(s) Administered  . Influenza-Unspecified 11/02/2016  . Pneumococcal Conjugate-13 07/03/2015  . Pneumococcal Polysaccharide-23 04/01/2017  . Td 02/20/2017    Screening Tests Health Maintenance  Topic Date Due  . FOOT EXAM  09/22/2017 (Originally 12/03/1936)  . HEMOGLOBIN A1C  09/22/2017  . OPHTHALMOLOGY EXAM  01/13/2018  . TETANUS/TDAP  02/21/2027  . INFLUENZA VACCINE  Completed  . PNA vac Low Risk Adult  Completed   Plan:     I have personally reviewed, addressed, and noted the following in the patient's chart:  A. Medical and social history B. Use of alcohol, tobacco or illicit drugs  C. Current medications and supplements D. Functional ability and status E.  Nutritional status F.  Physical activity G. Advance directives H. List of other physicians I.  Hospitalizations, surgeries, and ER visits in previous 12 months J.  [place] to include hearing, vision, cognitive, depression L. Referrals and appointments - none  In addition, I have reviewed and discussed with patient certain preventive protocols, quality metrics, and best practice recommendations. A written personalized care plan for preventive services as well as general preventive health recommendations were provided to patient.  See attached scanned questionnaire for additional information.   Signed,   Lindell Noe, MHA, BS, LPN Health Coach

## 2017-04-01 NOTE — Progress Notes (Signed)
Pre visit review using our clinic review tool, if applicable. No additional management support is needed unless otherwise documented below in the visit note. 

## 2017-04-01 NOTE — Patient Instructions (Addendum)
Tommy [name] , Thank you for taking time to come for your Medicare Wellness Visit. I appreciate your ongoing commitment to your health goals. Please review the following plan we discussed and let me know if I can assist you in the future.   These are the goals we discussed: Goals    . Increase physical activity     Starting 04/01/2017, I will continue to exercise for 90 minutes 3 days per week.        This is a list of the screening recommended for you and due dates:  Health Maintenance  Topic Date Due  . Complete foot exam   09/22/2017*  . Hemoglobin A1C  09/22/2017  . Eye exam for diabetics  01/13/2018  . Tetanus Vaccine  02/21/2027  . Flu Shot  Completed  . Pneumonia vaccines  Completed  *Topic was postponed. The date shown is not the original due date.    Preventive Care for Adults  A healthy lifestyle and preventive care can promote health and wellness. Preventive health guidelines for adults include the following key practices.  . A routine yearly physical is a good way to check with your health care provider about your health and preventive screening. It is a chance to share any concerns and updates on your health and to receive a thorough exam.  . Visit your dentist for a routine exam and preventive care every 6 months. Brush your teeth twice a day and floss once a day. Good oral hygiene prevents tooth decay and gum disease.  . The frequency of eye exams is based on your age, health, family medical history, use  of contact lenses, and other factors. Follow your health care provider's recommendations for frequency of eye exams.  . Eat a healthy diet. Foods like vegetables, fruits, whole grains, low-fat dairy products, and lean protein foods contain the nutrients you need without too many calories. Decrease your intake of foods high in solid fats, added sugars, and salt. Eat the right amount of calories for you. Get information about a proper diet from your health care provider,  if necessary.  . Regular physical exercise is one of the most important things you can do for your health. Most adults should get at least 150 minutes of moderate-intensity exercise (any activity that increases your heart rate and causes you to sweat) each week. In addition, most adults need muscle-strengthening exercises on 2 or more days a week.  Silver Sneakers may be a benefit available to you. To determine eligibility, you may visit the website: www.silversneakers.com or contact program at 747 110 6069 Mon-Fri between 8AM-8PM.   . Maintain a healthy weight. The body mass index (BMI) is a screening tool to identify possible weight problems. It provides an estimate of body fat based on height and weight. Your health care provider can find your BMI and can help you achieve or maintain a healthy weight.   For adults 20 years and older: ? A BMI below 18.5 is considered underweight. ? A BMI of 18.5 to 24.9 is normal. ? A BMI of 25 to 29.9 is considered overweight. ? A BMI of 30 and above is considered obese.   . Maintain normal blood lipids and cholesterol levels by exercising and minimizing your intake of saturated fat. Eat a balanced diet with plenty of fruit and vegetables. Blood tests for lipids and cholesterol should begin at age 30 and be repeated every 5 years. If your lipid or cholesterol levels are high, you are over 50, or  you are at high risk for heart disease, you may need your cholesterol levels checked more frequently. Ongoing high lipid and cholesterol levels should be treated with medicines if diet and exercise are not working.  . If you smoke, find out from your health care provider how to quit. If you do not use tobacco, please do not start.  . If you choose to drink alcohol, please do not consume more than 2 drinks per day. One drink is considered to be 12 ounces (355 mL) of beer, 5 ounces (148 mL) of wine, or 1.5 ounces (44 mL) of liquor.  . If you are 35-18 years old, ask  your health care provider if you should take aspirin to prevent strokes.  . Use sunscreen. Apply sunscreen liberally and repeatedly throughout the day. You should seek shade when your shadow is shorter than you. Protect yourself by wearing long sleeves, pants, a wide-brimmed hat, and sunglasses year round, whenever you are outdoors.  . Once a month, do a whole body skin exam, using a mirror to look at the skin on your back. Tell your health care provider of new moles, moles that have irregular borders, moles that are larger than a pencil eraser, or moles that have changed in shape or color.

## 2017-04-01 NOTE — Progress Notes (Signed)
PCP notes:   Health maintenance:  Foot exam - PCP please address at next appt [id number] - administered  Abnormal screenings:   Fall risk - hx of fall with injury and medical treatment Fall Risk  04/01/2017  Falls in the past year? Yes  Comment tripped over curb; injury to forehead and nose; medical treatment including stitches  Number falls in past yr: 1  Injury with Fall? Yes   Patient concerns:   None  Nurse concerns:  None  Next PCP appt:   09/22/17 @ 1430

## 2017-04-02 DIAGNOSIS — H903 Sensorineural hearing loss, bilateral: Secondary | ICD-10-CM | POA: Diagnosis not present

## 2017-04-03 ENCOUNTER — Encounter: Payer: Self-pay | Admitting: Primary Care

## 2017-04-03 DIAGNOSIS — Z1283 Encounter for screening for malignant neoplasm of skin: Secondary | ICD-10-CM

## 2017-04-22 ENCOUNTER — Telehealth: Payer: Self-pay

## 2017-04-22 MED ORDER — ONETOUCH VERIO W/DEVICE KIT: 1.0000 | PACK | Freq: Every day | 0 refills | Status: DC

## 2017-04-22 NOTE — Telephone Encounter (Signed)
I have called patient so he can explain. Patient stated that he need a Rx for the OneTouch Verio meter.   I have send the Rx for the meter electronically.

## 2017-04-22 NOTE — Telephone Encounter (Signed)
Patient came by the office stating that he picked up RX for one touch verio strips. He purchased the meter to be able to use one touch verio strips but states that he is trying to get a refund on the meter and he can get that if [facility] gets an ok from provider. [facility] faxed over this information also. If you have any questions let me know. Thank Edrick Kins, RMA

## 2017-04-23 DIAGNOSIS — I8393 Asymptomatic varicose veins of bilateral lower extremities: Secondary | ICD-10-CM | POA: Diagnosis not present

## 2017-04-23 DIAGNOSIS — L853 Xerosis cutis: Secondary | ICD-10-CM | POA: Diagnosis not present

## 2017-04-23 DIAGNOSIS — L720 Epidermal cyst: Secondary | ICD-10-CM | POA: Diagnosis not present

## 2017-04-23 DIAGNOSIS — D692 Other nonthrombocytopenic purpura: Secondary | ICD-10-CM | POA: Diagnosis not present

## 2017-04-23 DIAGNOSIS — L570 Actinic keratosis: Secondary | ICD-10-CM | POA: Diagnosis not present

## 2017-04-23 DIAGNOSIS — L578 Other skin changes due to chronic exposure to nonionizing radiation: Secondary | ICD-10-CM | POA: Diagnosis not present

## 2017-04-23 DIAGNOSIS — I781 Nevus, non-neoplastic: Secondary | ICD-10-CM | POA: Diagnosis not present

## 2017-04-23 DIAGNOSIS — Z85828 Personal history of other malignant neoplasm of skin: Secondary | ICD-10-CM | POA: Diagnosis not present

## 2017-04-23 DIAGNOSIS — D1800 Hemangioma unspecified site: Secondary | ICD-10-CM | POA: Diagnosis not present

## 2017-04-23 DIAGNOSIS — L812 Freckles: Secondary | ICD-10-CM | POA: Diagnosis not present

## 2017-04-23 DIAGNOSIS — L821 Other seborrheic keratosis: Secondary | ICD-10-CM | POA: Diagnosis not present

## 2017-04-23 DIAGNOSIS — Z1283 Encounter for screening for malignant neoplasm of skin: Secondary | ICD-10-CM | POA: Diagnosis not present

## 2017-04-25 ENCOUNTER — Other Ambulatory Visit: Payer: Self-pay | Admitting: Primary Care

## 2017-04-25 DIAGNOSIS — I2510 Atherosclerotic heart disease of native coronary artery without angina pectoris: Secondary | ICD-10-CM

## 2017-05-14 DIAGNOSIS — I10 Essential (primary) hypertension: Secondary | ICD-10-CM | POA: Diagnosis not present

## 2017-05-14 DIAGNOSIS — D631 Anemia in chronic kidney disease: Secondary | ICD-10-CM | POA: Diagnosis not present

## 2017-05-14 DIAGNOSIS — N2581 Secondary hyperparathyroidism of renal origin: Secondary | ICD-10-CM | POA: Diagnosis not present

## 2017-05-14 DIAGNOSIS — N183 Chronic kidney disease, stage 3 (moderate): Secondary | ICD-10-CM | POA: Diagnosis not present

## 2017-05-21 ENCOUNTER — Encounter: Payer: Self-pay | Admitting: Primary Care

## 2017-05-21 DIAGNOSIS — I8393 Asymptomatic varicose veins of bilateral lower extremities: Secondary | ICD-10-CM

## 2017-05-21 NOTE — Telephone Encounter (Signed)
Ok to refill? Electronically refill request for Betamethasone Valerate USP 0.1 %  Medication is not on current medication this. Last seen on 03/25/2017

## 2017-05-22 MED ORDER — BETAMETHASONE VALERATE 0.1 % EX CREA: TOPICAL_CREAM | Freq: Two times a day (BID) | CUTANEOUS | 0 refills | Status: DC

## 2017-05-23 ENCOUNTER — Inpatient Hospital Stay: Payer: Medicare Other | Attending: Oncology | Admitting: Oncology

## 2017-05-23 ENCOUNTER — Inpatient Hospital Stay: Payer: Medicare Other

## 2017-05-23 ENCOUNTER — Other Ambulatory Visit: Payer: Self-pay

## 2017-05-23 ENCOUNTER — Encounter: Payer: Self-pay | Admitting: Oncology

## 2017-05-23 VITALS — BP 150/57 | HR 70 | Temp 95.3°F | Resp 18 | Ht 70.87 in | Wt 168.0 lb

## 2017-05-23 DIAGNOSIS — E1122 Type 2 diabetes mellitus with diabetic chronic kidney disease: Secondary | ICD-10-CM | POA: Diagnosis not present

## 2017-05-23 DIAGNOSIS — R531 Weakness: Secondary | ICD-10-CM | POA: Insufficient documentation

## 2017-05-23 DIAGNOSIS — I2510 Atherosclerotic heart disease of native coronary artery without angina pectoris: Secondary | ICD-10-CM | POA: Diagnosis not present

## 2017-05-23 DIAGNOSIS — D539 Nutritional anemia, unspecified: Secondary | ICD-10-CM

## 2017-05-23 DIAGNOSIS — N183 Chronic kidney disease, stage 3 unspecified: Secondary | ICD-10-CM

## 2017-05-23 DIAGNOSIS — D649 Anemia, unspecified: Secondary | ICD-10-CM

## 2017-05-23 DIAGNOSIS — Z79899 Other long term (current) drug therapy: Secondary | ICD-10-CM | POA: Diagnosis not present

## 2017-05-23 DIAGNOSIS — I129 Hypertensive chronic kidney disease with stage 1 through stage 4 chronic kidney disease, or unspecified chronic kidney disease: Secondary | ICD-10-CM | POA: Diagnosis not present

## 2017-05-23 DIAGNOSIS — Z86711 Personal history of pulmonary embolism: Secondary | ICD-10-CM | POA: Insufficient documentation

## 2017-05-23 DIAGNOSIS — K219 Gastro-esophageal reflux disease without esophagitis: Secondary | ICD-10-CM | POA: Insufficient documentation

## 2017-05-23 DIAGNOSIS — E079 Disorder of thyroid, unspecified: Secondary | ICD-10-CM | POA: Diagnosis not present

## 2017-05-23 DIAGNOSIS — Z87891 Personal history of nicotine dependence: Secondary | ICD-10-CM | POA: Diagnosis not present

## 2017-05-23 DIAGNOSIS — E785 Hyperlipidemia, unspecified: Secondary | ICD-10-CM | POA: Diagnosis not present

## 2017-05-23 DIAGNOSIS — D631 Anemia in chronic kidney disease: Secondary | ICD-10-CM | POA: Insufficient documentation

## 2017-05-23 DIAGNOSIS — R5383 Other fatigue: Secondary | ICD-10-CM | POA: Diagnosis not present

## 2017-05-23 DIAGNOSIS — D696 Thrombocytopenia, unspecified: Secondary | ICD-10-CM | POA: Diagnosis not present

## 2017-05-23 DIAGNOSIS — Z794 Long term (current) use of insulin: Secondary | ICD-10-CM | POA: Diagnosis not present

## 2017-05-23 DIAGNOSIS — I6522 Occlusion and stenosis of left carotid artery: Secondary | ICD-10-CM | POA: Insufficient documentation

## 2017-05-23 LAB — IRON AND TIBC
Iron: 109 ug/dL (ref 45–182)
Saturation Ratios: 35 % (ref 17.9–39.5)
TIBC: 311 ug/dL (ref 250–450)
UIBC: 202 ug/dL

## 2017-05-23 LAB — FERRITIN: Ferritin: 670 ng/mL — ABNORMAL HIGH (ref 24–336)

## 2017-05-23 LAB — CBC WITH DIFFERENTIAL/PLATELET
Basophils Absolute: 0 10*3/uL (ref 0–0.1)
Basophils Relative: 1 %
Eosinophils Absolute: 0.2 10*3/uL (ref 0–0.7)
Eosinophils Relative: 3 %
HCT: 29.9 % — ABNORMAL LOW (ref 40.0–52.0)
Hemoglobin: 10.2 g/dL — ABNORMAL LOW (ref 13.0–18.0)
Lymphocytes Relative: 20 %
Lymphs Abs: 1.1 10*3/uL (ref 1.0–3.6)
MCH: 34.8 pg — ABNORMAL HIGH (ref 26.0–34.0)
MCHC: 34.3 g/dL (ref 32.0–36.0)
MCV: 101.3 fL — ABNORMAL HIGH (ref 80.0–100.0)
Monocytes Absolute: 0.6 10*3/uL (ref 0.2–1.0)
Monocytes Relative: 11 %
Neutro Abs: 3.6 10*3/uL (ref 1.4–6.5)
Neutrophils Relative %: 65 %
Platelets: 161 10*3/uL (ref 150–440)
RBC: 2.95 MIL/uL — ABNORMAL LOW (ref 4.40–5.90)
RDW: 12.6 % (ref 11.5–14.5)
WBC: 5.6 10*3/uL (ref 3.8–10.6)

## 2017-05-23 LAB — FOLATE: Folate: 66.3 ng/mL (ref >5.9)

## 2017-05-23 LAB — VITAMIN B12: Vitamin B-12: 864 pg/mL (ref 180–914)

## 2017-05-23 LAB — TSH: TSH: 2.076 u[IU]/mL (ref 0.350–4.500)

## 2017-05-23 NOTE — Progress Notes (Signed)
Here for for new pt evaluation. Feeling more fatigued recently he stated.

## 2017-05-23 NOTE — Progress Notes (Signed)
Hematology/Oncology Consult note [facility] Telephone:([phone] [phone]   Patient Care Team: Pleas Koch, NP as PCP - General (Internal Medicine) Estill Cotta, MD as Referring Physician (Ophthalmology) End, Harrell Gave, MD as Consulting Physician (Cardiology)  REFERRING PROVIDER: Dr.Lateef CHIEF COMPLAINTS/PURPOSE OF CONSULTATION:  Evaluation of anemia  HISTORY OF PRESENTING ILLNESS:  Tommy [name] is a  84 y.o.  male with PMH listed below who was referred to me for evaluation of anemia.  Patient recently removed from [place] to [place] to live with his son.  He has chronic kidney disease and anemia associated with CKD.  He reports that he has been getting Procrit 10,000 units every 2 weeks for many years.  Denies any recent thrombosis events.  Patient reports that at the last Procrit shot he received was back in September 2018 before he moved here. He establish care with Dr. Holley Raring for his CKD and he was referred to me for evaluation of anemia and possible Procrit shots.  Patient had lab work done at [facility] on 05/15/2017 hemoglobin 9.6, MCV 98, WBC 4.5, platelet count 1 70,000, normal differential use.  Patient denies seeing any blood in the stool or having any black stool.  Her last colonoscopy about 7 or 8 years ago.  Patient reports feeling fatigue otherwise well at baseline.    Review of Systems  Constitutional: Positive for malaise/fatigue. Negative for chills and fever.  HENT: Negative for hearing loss.   Eyes: Negative for double vision.  Respiratory: Negative for hemoptysis.   Cardiovascular: Negative for chest pain and orthopnea.  Gastrointestinal: Negative for abdominal pain, blood in stool, heartburn, nausea and vomiting.  Genitourinary: Negative for dysuria and urgency.  Musculoskeletal: Negative for myalgias and neck pain.  Skin: Negative for rash.  Neurological: Negative for  dizziness.  Endo/Heme/Allergies: Does not bruise/bleed easily.  Psychiatric/Behavioral: Negative for depression and suicidal ideas.    MEDICAL HISTORY:  Past Medical History:  Diagnosis Date  . Anemia   . Carotid artery occlusion    LEFT  . CKD (chronic kidney disease) stage 3, GFR 30-59 ml/min (HCC)   . Coronary artery disease   . Diabetes mellitus without complication ([place])   . Dyslipidemia   . GERD (gastroesophageal reflux disease)   . Hemorrhoids   . 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 Hypertension   . Pulmonary emboli ([place])   . Pulmonary embolism ([place])   . Thyroid disease     SURGICAL HISTORY: Past Surgical History:  Procedure Laterality Date  . CAROTID ENDARTERECTOMY Left 2004  . CORONARY ARTERY BYPASS GRAFT  1994   [name]  . TONSILLECTOMY AND ADENOIDECTOMY      SOCIAL HISTORY: Social History   Socioeconomic History  . Marital status: Widowed    Spouse name: Not on file  . Number of children: 4  . Years of education: 16  . Highest education level: Bachelor's degree (e.g., BA, AB, BS)  Occupational History  . Occupation: retired  Scientific laboratory technician  . Financial resource strain: Not hard at all  . Food insecurity:    Worry: Never true    Inability: Never true  . Transportation needs:    Medical: No    Non-medical: No  Tobacco Use  . Smoking status: Former Smoker    Packs/day: 1.50    Years: 25.00    Pack years: 37.50    Types: Cigarettes    Last attempt to quit: 1978    Years since quitting: 41.2  .  Smokeless tobacco: Never Used  Substance and Sexual Activity  . Alcohol use: Yes    Alcohol/week: 0.6 oz    Types: 1 Glasses of wine per week    Comment: socially   . Drug use: No  . Sexual activity: Not Currently  Lifestyle  . Physical activity:    Days per week: Not on file    Minutes per session: Not on file  . Stress: Not on file  Relationships  . Social connections:    Talks on phone: Not on file    Gets together: Not on file      Attends religious service: Not on file    Active member of club or organization: Not on file    Attends meetings of clubs or organizations: Not on file    Relationship status: Not on file  . Intimate partner violence:    Fear of current or ex partner: Not on file    Emotionally abused: Not on file    Physically abused: Not on file    Forced sexual activity: Not on file  Other Topics Concern  . Not on file  Social History Narrative   Widower.   4 children, 7 grandchildren, 1 great grandchild.   Retired. Once worked as an account.    Enjoys exercising, spending time with family.     FAMILY HISTORY: Family History  Problem Relation Age of Onset  . Heart attack Mother   . Heart disease Mother   . Stroke Mother   . Diabetes Father   . Coronary artery disease Sister     ALLERGIES:  is allergic to heparin.  MEDICATIONS:  Current Outpatient Medications  Medication Sig Dispense Refill  . ACCU-CHEK SOFTCLIX LANCETS lancets Use as instruct to test blood sugar once daily 100 each 4  . aspirin 81 MG chewable tablet Chew 81 mg by mouth every evening.    Marland Kitchen atorvastatin (LIPITOR) 40 MG tablet Take 1 tablet (40 mg total) daily by mouth. 90 tablet 3  . betamethasone valerate (VALISONE) 0.1 % cream Apply topically 2 (two) times daily. 30 g 0  . Blood Glucose Monitoring Suppl (ONETOUCH VERIO) w/Device KIT 1 Device by Does not apply route daily. Use as instructed to test blood sugar daily 1 kit 0  . CALCIUM PO Take 600 mg by mouth every morning.     . carvedilol (COREG) 6.25 MG tablet Take 1 tablet (6.25 mg total) 2 (two) times daily with a meal by mouth. 180 tablet 3  . clopidogrel (PLAVIX) 75 MG tablet Take 1 tablet (75 mg total) daily by mouth. 90 tablet 3  . furosemide (LASIX) 40 MG tablet TAKE 1 TABLET BY MOUTH  DAILY 90 tablet 1  . glimepiride (AMARYL) 4 MG tablet TAKE 1 TABLET BY MOUTH  DAILY WITH BREAKFAST 90 tablet 0  . glucose blood (ONETOUCH VERIO) test strip Use as instructed to  test blood sugar once daily 100 each 5  . Insulin Detemir (LEVEMIR) 100 UNIT/ML Pen Inject 14 Units into the skin daily at 10 pm. 15 mL 5  . Insulin Pen Needle 29G X 12.7MM MISC BD Ultrafine Pen Needle Use as instructed to inject insulin daily 300 each 1  . IRON PO Take by mouth 3 (three) times daily.     Marland Kitchen levothyroxine (SYNTHROID, LEVOTHROID) 175 MCG tablet Take 1 tablet by mouth every morning on an empty stomach with a full glass of water. 90 tablet 0  . losartan (COZAAR) 50 MG tablet Take 1 tablet (  50 mg total) by mouth daily. 90 tablet 3  . Multiple Vitamin (MULTIVITAMIN) capsule Take 1 capsule by mouth daily.    . Omega-3 Fatty Acids (FISH OIL PO) Take 1,200 mg by mouth 4 (four) times daily.    . pantoprazole (PROTONIX) 20 MG tablet TAKE 1 TABLET BY MOUTH  DAILY 90 tablet 0  . ranolazine (RANEXA) 500 MG 12 hr tablet Take 1 tablet (500 mg total) by mouth 2 (two) times daily. 180 tablet 3  . timolol (TIMOPTIC) 0.5 % ophthalmic solution APPLY 1 DROP(S) IN BOTH EYES ONCE A DAY  11  . epoetin alfa (EPOGEN,PROCRIT) 45409 UNIT/ML injection 10,000 Units. Every 2 to 4 weeks    . Hydrocortisone (PROCTOSOL HC RE) Place rectally as needed.    . nitroGLYCERIN (NITROSTAT) 0.4 MG SL tablet Place 1 tablet (0.4 mg total) under the tongue every 5 (five) minutes as needed for chest pain. (Patient not taking: Reported on 05/23/2017) 35 tablet 6   No current facility-administered medications for this visit.      PHYSICAL EXAMINATION: ECOG PERFORMANCE STATUS: 1 - Symptomatic but completely ambulatory Vitals:   05/23/17 1105  BP: (!) 150/57  Pulse: 70  Resp: 18  Temp: (!) 95.3 F (35.2 C)   Filed Weights   05/23/17 1105  Weight: 168 lb (76.2 kg)    Physical Exam  Constitutional: He is oriented to person, place, and time and well-developed, well-nourished, and in no distress. No distress.  HENT:  Head: Normocephalic and atraumatic.  Eyes: Pupils are equal, round, and reactive to light. EOM are  normal. No scleral icterus.  Neck: Neck supple.  Cardiovascular: Normal rate and regular rhythm.  No murmur heard. Pulmonary/Chest: Effort normal and breath sounds normal. No respiratory distress.  Abdominal: Bowel sounds are normal. There is no rebound.  Musculoskeletal: Normal range of motion. He exhibits no edema.  Lymphadenopathy:    He has no cervical adenopathy.  Neurological: He is alert and oriented to person, place, and time.  Skin: Skin is dry.  Psychiatric: Affect normal.     LABORATORY DATA:  I have reviewed the data as listed Lab Results  Component Value Date   WBC 5.6 05/23/2017   HGB 10.2 (L) 05/23/2017   HCT 29.9 (L) 05/23/2017   MCV 101.3 (H) 05/23/2017   PLT 161 05/23/2017   Recent Labs    03/19/17 1703 03/25/17 0931 04/01/17 1317  NA 137 138  138  --   K 4.6 5.5*  5.5* 4.6  CL 102 102  102  --   CO2 27 32  32  --   GLUCOSE 79 119*  119*  --   BUN 35* 34*  34*  --   CREATININE 1.78* 1.73*  1.73*  --   CALCIUM 9.3 9.5  9.5  --   GFRNONAA 32*  --   --   GFRAA 38*  --   --   PROT  --  7.3  --   ALBUMIN  --  4.1  --   AST  --  23  --   ALT  --  20  --   ALKPHOS  --  71  --   BILITOT  --  0.4  --        ASSESSMENT & PLAN:  1. Macrocytic anemia   2. Stage 3 chronic kidney disease ([place])   3. Anemia of chronic renal failure, stage 3 (moderate) (HCC)   4. Fatigue associated with anemia    Discussed with  patient that I will start with some lab work and check his baseline iron store.  Prefer him to have adequate iron stores before starting Procrit shots. Macrocytic anemia, will also check B12 and folate level, TSH. We will call him for arranging IV iron or Procrit shots if indicated.  Patient voices understanding and agree with the plan. # Labs reviewed, he has hemoglobin of 10.2 with MCV 101.3.  Rest lab pending.  Currently his hemoglobin is above 10, not needing Procrit right now. All questions were answered. The patient knows to call the  clinic with any problems questions or concerns.  Return of visit: 4 weeks Thank you for this kind referral and the opportunity to participate in the care of this patient. A copy of today's note is routed to referring provider    Earlie Server, MD, PhD Hematology Oncology [facility] at [facility] Pager- [phone] 05/23/2017

## 2017-05-24 ENCOUNTER — Encounter: Payer: Self-pay | Admitting: Oncology

## 2017-05-25 DIAGNOSIS — R531 Weakness: Secondary | ICD-10-CM | POA: Diagnosis not present

## 2017-05-25 DIAGNOSIS — I129 Hypertensive chronic kidney disease with stage 1 through stage 4 chronic kidney disease, or unspecified chronic kidney disease: Secondary | ICD-10-CM | POA: Diagnosis not present

## 2017-05-25 DIAGNOSIS — D539 Nutritional anemia, unspecified: Secondary | ICD-10-CM | POA: Diagnosis not present

## 2017-05-25 DIAGNOSIS — R5383 Other fatigue: Secondary | ICD-10-CM | POA: Diagnosis not present

## 2017-05-25 DIAGNOSIS — D631 Anemia in chronic kidney disease: Secondary | ICD-10-CM | POA: Diagnosis not present

## 2017-05-25 DIAGNOSIS — N183 Chronic kidney disease, stage 3 (moderate): Secondary | ICD-10-CM | POA: Diagnosis not present

## 2017-05-26 DIAGNOSIS — N183 Chronic kidney disease, stage 3 (moderate): Secondary | ICD-10-CM | POA: Diagnosis not present

## 2017-05-26 DIAGNOSIS — D539 Nutritional anemia, unspecified: Secondary | ICD-10-CM | POA: Diagnosis not present

## 2017-05-26 DIAGNOSIS — R5383 Other fatigue: Secondary | ICD-10-CM | POA: Diagnosis not present

## 2017-05-26 DIAGNOSIS — D631 Anemia in chronic kidney disease: Secondary | ICD-10-CM | POA: Diagnosis not present

## 2017-05-26 DIAGNOSIS — R531 Weakness: Secondary | ICD-10-CM | POA: Diagnosis not present

## 2017-05-26 DIAGNOSIS — I129 Hypertensive chronic kidney disease with stage 1 through stage 4 chronic kidney disease, or unspecified chronic kidney disease: Secondary | ICD-10-CM | POA: Diagnosis not present

## 2017-05-27 ENCOUNTER — Encounter: Payer: Self-pay | Admitting: Oncology

## 2017-05-27 ENCOUNTER — Other Ambulatory Visit: Payer: Self-pay | Admitting: *Deleted

## 2017-05-27 DIAGNOSIS — I129 Hypertensive chronic kidney disease with stage 1 through stage 4 chronic kidney disease, or unspecified chronic kidney disease: Secondary | ICD-10-CM | POA: Diagnosis not present

## 2017-05-27 DIAGNOSIS — N183 Chronic kidney disease, stage 3 (moderate): Secondary | ICD-10-CM | POA: Diagnosis not present

## 2017-05-27 DIAGNOSIS — D539 Nutritional anemia, unspecified: Secondary | ICD-10-CM | POA: Diagnosis not present

## 2017-05-27 DIAGNOSIS — D631 Anemia in chronic kidney disease: Secondary | ICD-10-CM | POA: Diagnosis not present

## 2017-05-27 DIAGNOSIS — R5383 Other fatigue: Secondary | ICD-10-CM | POA: Diagnosis not present

## 2017-05-27 DIAGNOSIS — R531 Weakness: Secondary | ICD-10-CM | POA: Diagnosis not present

## 2017-05-27 LAB — OCCULT BLOOD X 1 CARD TO LAB, STOOL
Fecal Occult Bld: NEGATIVE
Fecal Occult Bld: NEGATIVE
Fecal Occult Bld: NEGATIVE

## 2017-05-28 ENCOUNTER — Other Ambulatory Visit: Payer: Medicare Other

## 2017-06-09 ENCOUNTER — Other Ambulatory Visit: Payer: Self-pay | Admitting: Primary Care

## 2017-06-09 DIAGNOSIS — E118 Type 2 diabetes mellitus with unspecified complications: Secondary | ICD-10-CM

## 2017-06-09 DIAGNOSIS — K219 Gastro-esophageal reflux disease without esophagitis: Secondary | ICD-10-CM

## 2017-06-09 DIAGNOSIS — Z794 Long term (current) use of insulin: Secondary | ICD-10-CM

## 2017-06-18 ENCOUNTER — Encounter: Payer: Self-pay | Admitting: Internal Medicine

## 2017-06-18 ENCOUNTER — Ambulatory Visit (INDEPENDENT_AMBULATORY_CARE_PROVIDER_SITE_OTHER): Payer: Medicare Other | Admitting: Internal Medicine

## 2017-06-18 VITALS — BP 140/56 | HR 60 | Ht 70.0 in | Wt 166.5 lb

## 2017-06-18 DIAGNOSIS — I25118 Atherosclerotic heart disease of native coronary artery with other forms of angina pectoris: Secondary | ICD-10-CM | POA: Diagnosis not present

## 2017-06-18 DIAGNOSIS — E785 Hyperlipidemia, unspecified: Secondary | ICD-10-CM | POA: Diagnosis not present

## 2017-06-18 DIAGNOSIS — I10 Essential (primary) hypertension: Secondary | ICD-10-CM

## 2017-06-18 DIAGNOSIS — I779 Disorder of arteries and arterioles, unspecified: Secondary | ICD-10-CM | POA: Diagnosis not present

## 2017-06-18 DIAGNOSIS — I739 Peripheral vascular disease, unspecified: Secondary | ICD-10-CM

## 2017-06-18 MED ORDER — NITROGLYCERIN 0.4 MG SL SUBL: 0.4000 mg | SUBLINGUAL_TABLET | SUBLINGUAL | 3 refills | Status: DC | PRN

## 2017-06-18 MED ORDER — ISOSORBIDE MONONITRATE ER 30 MG PO TB24: 30.0000 mg | ORAL_TABLET | Freq: Every day | ORAL | 3 refills | Status: DC

## 2017-06-18 NOTE — Patient Instructions (Signed)
Medication Instructions:  Your physician has recommended you make the following change in your medication:  1- STOP Ranexa. 2- START Imdur 30 mg (1 tablet) by mouth once a day.   Labwork: NONE  Testing/Procedures: NONE  Follow-Up: Your physician recommends that you schedule a follow-up appointment in: 3 MONTHS WITH DR END.  If you need a refill on your cardiac medications before your next appointment, please call your pharmacy.

## 2017-06-18 NOTE — Progress Notes (Signed)
Follow-up Outpatient Visit Date: 06/18/2017  Primary Care Provider: Pleas Koch, NP [place] [address]  Chief Complaint: Follow-up dyspnea on exertion and chest pain  HPI:  Mr. Vanderschaaf is a 84 y.o. year-old male with history of CAD status post remote CABG with stable angina, PAD, carotid artery stenosis status post left endarterectomy, HTN, HLD, and type II DM, who presents for follow-up of CAD.  I last saw him in January, at which time he was concerned about more exertional dyspnea.  He wondered if this could be due to timolol eyedrops, which had been started around the time that his dyspnea worsened.  Today, Mr. Mahabir reports that his shortness of breath is back to baseline despite remaining on atenolol.  He continues to have occasional episodes of substernal chest tightness, most often at night.  They resolve promptly with sublingual nitroglycerin, which she has taken a handful of times since our last visit.  He continues to exercise regularly, trying to walk between 1 and 1.5 miles a day.  He is also performing resistance exercises.  He does not have significant chest pain with exercise.  Mr. Sjogren denies palpitations, lightheadedness, orthopnea, PND, and edema.  He is compliant with his medications.  In hindsight, he wonders if ranolazine has been giving him much benefit.  He notes that it is quite expensive.  He has not had any significant bleeding.  He is being evaluated by Dr. Tasia Catchings (hematology) due to chronic anemia.Marland Kitchen  --------------------------------------------------------------------------------------------------  Cardiovascular History & Procedures: Cardiovascular Problems:  Coronary artery disease status post CABG (1994)  Carotid artery stenosis status post left carotid endarterectomy (2004)  Abdominal aortic aneurysm  Risk Factors:  Known coronary artery disease, hypertension, hyperlipidemia, diabetes mellitus, male gender, and  age  Cath/PCI:  LHC (02/12/16): Severe native disease with chronic total occlusions of the ostial LAD and LCx as well as 99% stenosis of distal LMCA. 80% ostial RCA stenosis as well as diffuse mid RCA with tandem lesions of 85-99%. Patent LIMA to LAD and SVG to distal RCA. Additional SVG to unknown vessel is chronically occluded.  CV Surgery:  CABG (1994, [place], [place]): LIMA to LAD, SVG to distal RCA, and SVG to other unknown vessel.  Left carotid endarterectomy (2004)  EP Procedures and Devices:  None  Non-Invasive Evaluation(s):  TTE (02/06/17): Normal LV size.  LVEF 55-60% with normal wall motion.  Normal diastolic function.  Aortic sclerosis without stenosis.  Mild MR.  Mild left atrial enlargement.  Moderate TR.  Upper normal to mildly elevated pulmonary artery pressure.  Carotid Doppler (02/06/17): Mild narrowing of bilateral internal carotid arteries (less than 40%.  Less than 50% stenosis involving the right common carotid artery.  Antegrade vertebral artery flow bilaterally.  Normal flow in the subclavian arteries bilaterally.  Carotid artery Doppler (09/14/15): Moderate plaque in the right ICA with less than 50% stenosis.  Pharmacologic MPI (12/16/13): Mild inferior ischemia with LVEF of 58%.  TTE (12/02/13): Mild LVH with LVEF of 65% and grade 1 diastolic dysfunction. Aortic sclerosis, mild TR, and moderate pulmonary hypertension. Question PFO.  Aortoiliac ultrasound (08/09/13): No evidence of AAA. Moderately elevated right common iliac artery velocity with 50-75% stenosis.  Recent CV Pertinent Labs: Lab Results  Component Value Date   CHOL 132 03/25/2017   HDL 42.40 03/25/2017   LDLCALC 61 03/25/2017   TRIG 142.0 03/25/2017   CHOLHDL 3 03/25/2017   K 4.6 04/01/2017   BUN 34 (H) 03/25/2017   BUN 34 (  H) 03/25/2017   CREATININE 1.73 (H) 03/25/2017   CREATININE 1.73 (H) 03/25/2017    Past medical and surgical history were reviewed and updated in  EPIC.  Current Meds  Medication Sig  . ACCU-CHEK SOFTCLIX LANCETS lancets Use as instruct to test blood sugar once daily  . aspirin 81 MG chewable tablet Chew 81 mg by mouth every evening.  Marland Kitchen atorvastatin (LIPITOR) 40 MG tablet Take 1 tablet (40 mg total) daily by mouth.  . betamethasone valerate (VALISONE) 0.1 % cream Apply topically 2 (two) times daily.  . Blood Glucose Monitoring Suppl (ONETOUCH VERIO) w/Device KIT 1 Device by Does not apply route daily. Use as instructed to test blood sugar daily  . CALCIUM PO Take 600 mg by mouth every morning.   . carvedilol (COREG) 6.25 MG tablet Take 1 tablet (6.25 mg total) 2 (two) times daily with a meal by mouth.  . clopidogrel (PLAVIX) 75 MG tablet Take 1 tablet (75 mg total) daily by mouth.  Marland Kitchen epoetin alfa (EPOGEN,PROCRIT) 55732 UNIT/ML injection 10,000 Units. Every 2 to 4 weeks  . furosemide (LASIX) 40 MG tablet TAKE 1 TABLET BY MOUTH  DAILY  . glimepiride (AMARYL) 4 MG tablet TAKE 1 TABLET BY MOUTH  DAILY WITH BREAKFAST  . glucose blood (ONETOUCH VERIO) test strip Use as instructed to test blood sugar once daily  . Hydrocortisone (PROCTOSOL HC RE) Place rectally as needed.  . Insulin Detemir (LEVEMIR) 100 UNIT/ML Pen Inject 14 Units into the skin daily at 10 pm.  . Insulin Pen Needle 29G X 12.7MM MISC BD Ultrafine Pen Needle Use as instructed to inject insulin daily  . IRON PO Take by mouth 3 (three) times daily.   Marland Kitchen levothyroxine (SYNTHROID, LEVOTHROID) 175 MCG tablet Take 1 tablet by mouth every morning on an empty stomach with a full glass of water.  . Multiple Vitamin (MULTIVITAMIN) capsule Take 1 capsule by mouth daily.  . nitroGLYCERIN (NITROSTAT) 0.4 MG SL tablet Place 1 tablet (0.4 mg total) under the tongue every 5 (five) minutes as needed for chest pain.  . Omega-3 Fatty Acids (FISH OIL PO) Take 1,200 mg by mouth 4 (four) times daily.  . pantoprazole (PROTONIX) 20 MG tablet TAKE 1 TABLET BY MOUTH  DAILY  . ranolazine (RANEXA) 500 MG  12 hr tablet Take 1 tablet (500 mg total) by mouth 2 (two) times daily.  . timolol (TIMOPTIC) 0.5 % ophthalmic solution APPLY 1 DROP(S) IN BOTH EYES ONCE A DAY    Allergies: Heparin  Social History   Socioeconomic History  . Marital status: Widowed    Spouse name: Not on file  . Number of children: 4  . Years of education: 16  . Highest education level: Bachelor's degree (e.g., BA, AB, BS)  Occupational History  . Occupation: retired  Scientific laboratory technician  . Financial resource strain: Not hard at all  . Food insecurity:    Worry: Never true    Inability: Never true  . Transportation needs:    Medical: No    Non-medical: No  Tobacco Use  . Smoking status: Former Smoker    Packs/day: 1.50    Years: 25.00    Pack years: 37.50    Types: Cigarettes    Last attempt to quit: 1978    Years since quitting: 41.3  . Smokeless tobacco: Never Used  Substance and Sexual Activity  . Alcohol use: Yes    Alcohol/week: 0.6 oz    Types: 1 Glasses of wine per week  Comment: socially   . Drug use: No  . Sexual activity: Not Currently  Lifestyle  . Physical activity:    Days per week: Not on file    Minutes per session: Not on file  . Stress: Not on file  Relationships  . Social connections:    Talks on phone: Not on file    Gets together: Not on file    Attends religious service: Not on file    Active member of club or organization: Not on file    Attends meetings of clubs or organizations: Not on file    Relationship status: Not on file  . Intimate partner violence:    Fear of current or ex partner: Not on file    Emotionally abused: Not on file    Physically abused: Not on file    Forced sexual activity: Not on file  Other Topics Concern  . Not on file  Social History Narrative   Widower.   4 children, 7 grandchildren, 1 great grandchild.   Retired. Once worked as an account.    Enjoys exercising, spending time with family.     Family History  Problem Relation Age of Onset   . Heart attack Mother   . Heart disease Mother   . Stroke Mother   . Diabetes Father   . Coronary artery disease Sister     Review of Systems: A 12-system review of systems was performed and was negative except as noted in the HPI.  --------------------------------------------------------------------------------------------------  Physical Exam: BP (!) 140/56 (BP Location: Left Arm, Patient Position: Sitting, Cuff Size: Normal)   Pulse 60   Ht '5\' 10"'$  (1.778 m)   Wt 166 lb 8 oz (75.5 kg)   BMI 23.89 kg/m   General: NAD. HEENT: No conjunctival pallor or scleral icterus. Moist mucous membranes.  OP clear. Neck: Supple without lymphadenopathy, thyromegaly, JVD, or HJR. Lungs: Normal work of breathing. Clear to auscultation bilaterally without wheezes or crackles. Heart: Regular rate and rhythm with 2/6 systolic murmur.  No rubs. Abd: Bowel sounds present. Soft, NT/ND without hepatosplenomegaly Ext: No lower extremity edema with compression stockings in place. Skin: Warm and dry without rash.  EKG: NSR with first-degree AV block and RBBB.  No significant change since 03/19/17.  Lab Results  Component Value Date   WBC 5.6 05/23/2017   HGB 10.2 (L) 05/23/2017   HCT 29.9 (L) 05/23/2017   MCV 101.3 (H) 05/23/2017   PLT 161 05/23/2017    Lab Results  Component Value Date   NA 138 03/25/2017   NA 138 03/25/2017   K 4.6 04/01/2017   CL 102 03/25/2017   CL 102 03/25/2017   CO2 32 03/25/2017   CO2 32 03/25/2017   BUN 34 (H) 03/25/2017   BUN 34 (H) 03/25/2017   CREATININE 1.73 (H) 03/25/2017   CREATININE 1.73 (H) 03/25/2017   GLUCOSE 119 (H) 03/25/2017   GLUCOSE 119 (H) 03/25/2017   ALT 20 03/25/2017    Lab Results  Component Value Date   CHOL 132 03/25/2017   HDL 42.40 03/25/2017   LDLCALC 61 03/25/2017   TRIG 142.0 03/25/2017   CHOLHDL 3 03/25/2017    --------------------------------------------------------------------------------------------------  ASSESSMENT  AND PLAN: Coronary artery disease with stable angina Overall, symptoms are stable and predominantly nocturnal.  Mr. Dohse is unsure if he has derived much benefit from ranolazine but notes that it is quite expensive.  I am hesitant to escalate this further in the setting of CKD.  He has  had diarrhea in the past with isosorbide mononitrate but now wonders if something else was causing his GI issues.  He would like to retry isosorbide mononitrate.  We will begin with 30 mg daily; he can decrease this to 15 mg daily if he has persistent headache or other side effects.  I will have him stop ranolazine for now.  No other medication changes today.  If his chronic anemia were to worsen or there is evidence of occult bleeding, we would need to consider discontinuation of clopidogrel.  Essential hypertension Systolic blood pressure mildly elevated.  As above, I will add isosorbide mononitrate 30 mg daily.  Continue current doses of carvedilol and losartan.  Hyperlipidemia (goal LDL less than 70) LDL at goal in January.  Continue atorvastatin 40 mg daily.  Carotid artery disease No symptoms.  Continue secondary prevention with blood pressure control, statin therapy, and antiplatelet therapy.  Consider repeat carotid Dopplers in the winter.  Follow-up: Return to clinic in 3 months.  Nelva Bush, MD 06/18/2017 2:57 PM

## 2017-06-20 ENCOUNTER — Inpatient Hospital Stay (HOSPITAL_BASED_OUTPATIENT_CLINIC_OR_DEPARTMENT_OTHER): Payer: Medicare Other | Admitting: Oncology

## 2017-06-20 ENCOUNTER — Encounter: Payer: Self-pay | Admitting: Oncology

## 2017-06-20 ENCOUNTER — Inpatient Hospital Stay: Payer: Medicare Other

## 2017-06-20 ENCOUNTER — Inpatient Hospital Stay: Payer: Medicare Other | Admitting: Oncology

## 2017-06-20 ENCOUNTER — Inpatient Hospital Stay: Payer: Medicare Other | Attending: Oncology

## 2017-06-20 VITALS — BP 152/64 | HR 60 | Temp 97.8°F | Resp 16 | Wt 164.4 lb

## 2017-06-20 DIAGNOSIS — Z794 Long term (current) use of insulin: Secondary | ICD-10-CM | POA: Diagnosis not present

## 2017-06-20 DIAGNOSIS — N183 Chronic kidney disease, stage 3 unspecified: Secondary | ICD-10-CM

## 2017-06-20 DIAGNOSIS — Z86711 Personal history of pulmonary embolism: Secondary | ICD-10-CM

## 2017-06-20 DIAGNOSIS — Z87891 Personal history of nicotine dependence: Secondary | ICD-10-CM | POA: Insufficient documentation

## 2017-06-20 DIAGNOSIS — D631 Anemia in chronic kidney disease: Secondary | ICD-10-CM | POA: Insufficient documentation

## 2017-06-20 DIAGNOSIS — I6522 Occlusion and stenosis of left carotid artery: Secondary | ICD-10-CM

## 2017-06-20 DIAGNOSIS — I10 Essential (primary) hypertension: Secondary | ICD-10-CM | POA: Insufficient documentation

## 2017-06-20 DIAGNOSIS — Z7982 Long term (current) use of aspirin: Secondary | ICD-10-CM | POA: Diagnosis not present

## 2017-06-20 DIAGNOSIS — D539 Nutritional anemia, unspecified: Secondary | ICD-10-CM

## 2017-06-20 DIAGNOSIS — Z79899 Other long term (current) drug therapy: Secondary | ICD-10-CM

## 2017-06-20 DIAGNOSIS — I2510 Atherosclerotic heart disease of native coronary artery without angina pectoris: Secondary | ICD-10-CM | POA: Diagnosis not present

## 2017-06-20 DIAGNOSIS — E119 Type 2 diabetes mellitus without complications: Secondary | ICD-10-CM

## 2017-06-20 DIAGNOSIS — K219 Gastro-esophageal reflux disease without esophagitis: Secondary | ICD-10-CM | POA: Insufficient documentation

## 2017-06-20 DIAGNOSIS — I129 Hypertensive chronic kidney disease with stage 1 through stage 4 chronic kidney disease, or unspecified chronic kidney disease: Secondary | ICD-10-CM

## 2017-06-20 DIAGNOSIS — E079 Disorder of thyroid, unspecified: Secondary | ICD-10-CM | POA: Diagnosis not present

## 2017-06-20 DIAGNOSIS — E785 Hyperlipidemia, unspecified: Secondary | ICD-10-CM | POA: Diagnosis not present

## 2017-06-20 LAB — CBC WITH DIFFERENTIAL/PLATELET
Basophils Absolute: 0.3 10*3/uL — ABNORMAL HIGH (ref 0–0.1)
Basophils Relative: 6 %
Eosinophils Absolute: 0.1 10*3/uL (ref 0–0.7)
Eosinophils Relative: 3 %
HCT: 28.4 % — ABNORMAL LOW (ref 40.0–52.0)
Hemoglobin: 9.7 g/dL — ABNORMAL LOW (ref 13.0–18.0)
Lymphocytes Relative: 17 %
Lymphs Abs: 1 10*3/uL (ref 1.0–3.6)
MCH: 34.6 pg — ABNORMAL HIGH (ref 26.0–34.0)
MCHC: 34.1 g/dL (ref 32.0–36.0)
MCV: 101.5 fL — ABNORMAL HIGH (ref 80.0–100.0)
Monocytes Absolute: 0.5 10*3/uL (ref 0.2–1.0)
Monocytes Relative: 9 %
Neutro Abs: 3.7 10*3/uL (ref 1.4–6.5)
Neutrophils Relative %: 65 %
Platelets: 156 10*3/uL (ref 150–440)
RBC: 2.79 MIL/uL — ABNORMAL LOW (ref 4.40–5.90)
RDW: 12.6 % (ref 11.5–14.5)
WBC: 5.6 10*3/uL (ref 3.8–10.6)

## 2017-06-20 NOTE — Progress Notes (Signed)
Hematology/Oncology follow up note [facility] Telephone:(336) [phone] Fax:(336) [phone]   Patient Care Team: Pleas Koch, NP as PCP - General (Internal Medicine) Dingeldein, Remo Lipps, MD as Referring Physician (Ophthalmology) End, Harrell Gave, MD as Consulting Physician (Cardiology)  REFERRING PROVIDER: Dr.Lateef REASON FOR VISIT Follow up for treatment of anemia of CKD  HISTORY OF PRESENTING ILLNESS:  Tommy [name] is a  84 y.o.  male with PMH listed below who was referred to me for evaluation of anemia.  Patient recently removed from [place] to [place] to live with his son.  He has chronic kidney disease and anemia associated with CKD.  He reports that he has been getting Procrit 10,000 units every 2 weeks for many years.  Denies any recent thrombosis events.  Patient reports that at the last Procrit shot he received was back in September 2018 before he moved here. He establish care with Dr. Holley Raring for his CKD and he was referred to me for evaluation of anemia and possible Procrit shots.  Patient had lab work done at [facility] on 05/15/2017 hemoglobin 9.6, MCV 98, WBC 4.5, platelet count 1 70,000, normal differential use.   Her last colonoscopy about 7 or 8 years ago  [name] is a 84 y.o. male who has above history reviewed by me today presents for follow up visit for management of anemia of CKD. He has had labs done during the interval and comes to discuss about results.  Patient denies seeing any blood in the stool or having any black stool. .  Patient reports feeling fatigue otherwise well at baseline.     Review of Systems  Constitutional: Positive for malaise/fatigue. Negative for chills, fever and weight loss.  HENT: Negative for ear discharge, hearing loss and nosebleeds.   Eyes: Negative for double vision, photophobia and pain.  Respiratory: Negative for hemoptysis and sputum  production.   Cardiovascular: Negative for chest pain, palpitations, orthopnea and claudication.  Gastrointestinal: Negative for abdominal pain, blood in stool, heartburn, nausea and vomiting.  Genitourinary: Negative for dysuria, frequency and urgency.  Musculoskeletal: Negative for myalgias and neck pain.  Skin: Negative for rash.  Neurological: Negative for dizziness, tingling and tremors.  Endo/Heme/Allergies: Does not bruise/bleed easily.  Psychiatric/Behavioral: Negative for depression, hallucinations and suicidal ideas. The patient is not nervous/anxious.     MEDICAL HISTORY:  Past Medical History:  Diagnosis Date  . Anemia   . Carotid artery occlusion    LEFT  . CKD (chronic kidney disease) stage 3, GFR 30-59 ml/min (HCC)   . Coronary artery disease   . Diabetes mellitus without complication ([place])   . Dyslipidemia   . GERD (gastroesophageal reflux disease)   . Hemorrhoids   . 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 Hypertension   . Pulmonary emboli ([place])   . Pulmonary embolism ([place])   . Thyroid disease     SURGICAL HISTORY: Past Surgical History:  Procedure Laterality Date  . CAROTID ENDARTERECTOMY Left 2004  . CORONARY ARTERY BYPASS GRAFT  1994   [name]  . TONSILLECTOMY AND ADENOIDECTOMY      SOCIAL HISTORY: Social History   Socioeconomic History  . Marital status: Widowed    Spouse name: Not on file  . Number of children: 4  . Years of education: 16  . Highest education level: Bachelor's degree (e.g., BA, AB, BS)  Occupational History  . Occupation: retired  Scientific laboratory technician  . Financial resource strain: Not hard at  all  . Food insecurity:    Worry: Never true    Inability: Never true  . Transportation needs:    Medical: No    Non-medical: No  Tobacco Use  . Smoking status: Former Smoker    Packs/day: 1.50    Years: 25.00    Pack years: 37.50    Types: Cigarettes    Last attempt to quit: 1978    Years since quitting: 41.3  .  Smokeless tobacco: Never Used  Substance and Sexual Activity  . Alcohol use: Yes    Alcohol/week: 0.6 oz    Types: 1 Glasses of wine per week    Comment: socially   . Drug use: No  . Sexual activity: Not Currently  Lifestyle  . Physical activity:    Days per week: Not on file    Minutes per session: Not on file  . Stress: Not on file  Relationships  . Social connections:    Talks on phone: Not on file    Gets together: Not on file    Attends religious service: Not on file    Active member of club or organization: Not on file    Attends meetings of clubs or organizations: Not on file    Relationship status: Not on file  . Intimate partner violence:    Fear of current or ex partner: Not on file    Emotionally abused: Not on file    Physically abused: Not on file    Forced sexual activity: Not on file  Other Topics Concern  . Not on file  Social History Narrative   Widower.   4 children, 7 grandchildren, 1 great grandchild.   Retired. Once worked as an account.    Enjoys exercising, spending time with family.     FAMILY HISTORY: Family History  Problem Relation Age of Onset  . Heart attack Mother   . Heart disease Mother   . Stroke Mother   . Diabetes Father   . Coronary artery disease Sister     ALLERGIES:  is allergic to heparin.  MEDICATIONS:  Current Outpatient Medications  Medication Sig Dispense Refill  . ACCU-CHEK SOFTCLIX LANCETS lancets Use as instruct to test blood sugar once daily 100 each 4  . aspirin 81 MG chewable tablet Chew 81 mg by mouth every evening.    Marland Kitchen atorvastatin (LIPITOR) 40 MG tablet Take 1 tablet (40 mg total) daily by mouth. 90 tablet 3  . betamethasone valerate (VALISONE) 0.1 % cream Apply topically 2 (two) times daily. 30 g 0  . Blood Glucose Monitoring Suppl (ONETOUCH VERIO) w/Device KIT 1 Device by Does not apply route daily. Use as instructed to test blood sugar daily 1 kit 0  . CALCIUM PO Take 600 mg by mouth every morning.     .  carvedilol (COREG) 6.25 MG tablet Take 1 tablet (6.25 mg total) 2 (two) times daily with a meal by mouth. 180 tablet 3  . clopidogrel (PLAVIX) 75 MG tablet Take 1 tablet (75 mg total) daily by mouth. 90 tablet 3  . epoetin alfa (EPOGEN,PROCRIT) 99242 UNIT/ML injection 10,000 Units. Every 2 to 4 weeks    . furosemide (LASIX) 40 MG tablet TAKE 1 TABLET BY MOUTH  DAILY 90 tablet 1  . glimepiride (AMARYL) 4 MG tablet TAKE 1 TABLET BY MOUTH  DAILY WITH BREAKFAST 90 tablet 0  . glucose blood (ONETOUCH VERIO) test strip Use as instructed to test blood sugar once daily 100 each 5  .  Hydrocortisone (PROCTOSOL HC RE) Place rectally as needed.    . Insulin Detemir (LEVEMIR) 100 UNIT/ML Pen Inject 14 Units into the skin daily at 10 pm. 15 mL 5  . Insulin Pen Needle 29G X 12.7MM MISC BD Ultrafine Pen Needle Use as instructed to inject insulin daily 300 each 1  . IRON PO Take by mouth 3 (three) times daily.     . isosorbide mononitrate (IMDUR) 30 MG 24 hr tablet Take 1 tablet (30 mg total) by mouth daily. 90 tablet 3  . levothyroxine (SYNTHROID, LEVOTHROID) 175 MCG tablet Take 1 tablet by mouth every morning on an empty stomach with a full glass of water. 90 tablet 0  . losartan (COZAAR) 50 MG tablet Take 1 tablet (50 mg total) by mouth daily. 90 tablet 3  . Multiple Vitamin (MULTIVITAMIN) capsule Take 1 capsule by mouth daily.    . nitroGLYCERIN (NITROSTAT) 0.4 MG SL tablet Place 1 tablet (0.4 mg total) under the tongue every 5 (five) minutes as needed for chest pain. 25 tablet 3  . Omega-3 Fatty Acids (FISH OIL PO) Take 1,200 mg by mouth 4 (four) times daily.    . pantoprazole (PROTONIX) 20 MG tablet TAKE 1 TABLET BY MOUTH  DAILY 90 tablet 0  . timolol (TIMOPTIC) 0.5 % ophthalmic solution APPLY 1 DROP(S) IN BOTH EYES ONCE A DAY  11   No current facility-administered medications for this visit.      PHYSICAL EXAMINATION: ECOG PERFORMANCE STATUS: 1 - Symptomatic but completely ambulatory Vitals:    06/20/17 1435  BP: (!) 152/64  Pulse: 60  Resp: 16  Temp: 97.8 F (36.6 C)   Filed Weights   06/20/17 1433  Weight: 164 lb 6.4 oz (74.6 kg)    Physical Exam  Constitutional: He is oriented to person, place, and time and well-developed, well-nourished, and in no distress. No distress.  HENT:  Head: Normocephalic and atraumatic.  Mouth/Throat: Oropharynx is clear and moist. No oropharyngeal exudate.  Eyes: Pupils are equal, round, and reactive to light. EOM are normal. Right eye exhibits no discharge. Left eye exhibits no discharge. No scleral icterus.  Neck: Neck supple. No JVD present.  Cardiovascular: Normal rate, regular rhythm and normal heart sounds.  No murmur heard. Pulmonary/Chest: Effort normal and breath sounds normal. No respiratory distress. He has no wheezes. He has no rales.  Abdominal: Bowel sounds are normal. He exhibits no distension. There is no tenderness. There is no rebound.  Musculoskeletal: Normal range of motion. He exhibits no edema.  Lymphadenopathy:    He has no cervical adenopathy.  Neurological: He is alert and oriented to person, place, and time. No cranial nerve deficit. Coordination normal.  Skin: Skin is dry. No erythema.  Psychiatric: Affect and judgment normal.     LABORATORY DATA:  I have reviewed the data as listed Lab Results  Component Value Date   WBC 5.6 05/23/2017   HGB 10.2 (L) 05/23/2017   HCT 29.9 (L) 05/23/2017   MCV 101.3 (H) 05/23/2017   PLT 161 05/23/2017   Recent Labs    03/19/17 1703 03/25/17 0931 04/01/17 1317  NA 137 138  138  --   K 4.6 5.5*  5.5* 4.6  CL 102 102  102  --   CO2 27 32  32  --   GLUCOSE 79 119*  119*  --   BUN 35* 34*  34*  --   CREATININE 1.78* 1.73*  1.73*  --   CALCIUM 9.3 9.5  9.5  --   GFRNONAA 32*  --   --   GFRAA 38*  --   --   PROT  --  7.3  --   ALBUMIN  --  4.1  --   AST  --  23  --   ALT  --  20  --   ALKPHOS  --  71  --   BILITOT  --  0.4  --       Iron/TIBC/Ferritin/ %Sat    Component Value Date/Time   IRON 109 05/23/2017 1153   TIBC 311 05/23/2017 1153   FERRITIN 670 (H) 05/23/2017 1153   IRONPCTSAT 35 05/23/2017 1153    ASSESSMENT & PLAN:  1. Anemia of chronic renal failure, stage 3 (moderate) (HCC)   2. Macrocytic anemia   3. Stage 3 chronic kidney disease ([place])    # Lab was discussed with patient. He has adequate iron store and can be started on procrit therapy.   Today hemoglobin is less than 10, plan start him on procrit 20,000 units every 4 weeks. Rationale and side effects including but not limited to thrombosis events, hypertension, cancer progression etc were discussed with patient. He voices understanding and agrees with plan.   # Macrocytic anemia, Normal B12 and folate level, TSH. ?MDS. Continue monitor.   Check cbc, SPEP at next visit.  All questions were answered. The patient knows to call the clinic with any problems questions or concerns.  Return of visit: 4 weeks Thank you for this kind referral and the opportunity to participate in the care of this patient. A copy of today's note is routed to referring provider    Earlie Server, MD, PhD Hematology Oncology [facility] at [facility] Pager- [phone] 06/20/2017

## 2017-06-21 ENCOUNTER — Encounter: Payer: Self-pay | Admitting: Primary Care

## 2017-06-21 DIAGNOSIS — E039 Hypothyroidism, unspecified: Secondary | ICD-10-CM

## 2017-06-22 ENCOUNTER — Other Ambulatory Visit: Payer: Self-pay | Admitting: Primary Care

## 2017-06-22 DIAGNOSIS — E039 Hypothyroidism, unspecified: Secondary | ICD-10-CM

## 2017-06-23 ENCOUNTER — Inpatient Hospital Stay: Payer: Medicare Other

## 2017-06-23 VITALS — BP 151/51 | HR 62

## 2017-06-23 DIAGNOSIS — D631 Anemia in chronic kidney disease: Secondary | ICD-10-CM

## 2017-06-23 DIAGNOSIS — Z79899 Other long term (current) drug therapy: Secondary | ICD-10-CM | POA: Diagnosis not present

## 2017-06-23 DIAGNOSIS — N183 Chronic kidney disease, stage 3 (moderate): Secondary | ICD-10-CM | POA: Diagnosis not present

## 2017-06-23 DIAGNOSIS — I2510 Atherosclerotic heart disease of native coronary artery without angina pectoris: Secondary | ICD-10-CM | POA: Diagnosis not present

## 2017-06-23 DIAGNOSIS — I129 Hypertensive chronic kidney disease with stage 1 through stage 4 chronic kidney disease, or unspecified chronic kidney disease: Secondary | ICD-10-CM | POA: Diagnosis not present

## 2017-06-23 DIAGNOSIS — I6522 Occlusion and stenosis of left carotid artery: Secondary | ICD-10-CM | POA: Diagnosis not present

## 2017-06-23 MED ORDER — EPOETIN ALFA 20000 UNIT/ML IJ SOLN
20000.0000 [IU] | Freq: Once | INTRAMUSCULAR | Status: AC
Start: 2017-06-23 — End: 2017-06-23
  Administered 2017-06-23: 20000 [IU] via SUBCUTANEOUS

## 2017-06-24 MED ORDER — LEVOTHYROXINE SODIUM 175 MCG PO TABS: ORAL_TABLET | ORAL | 1 refills | Status: DC

## 2017-06-24 MED ORDER — LEVOTHYROXINE SODIUM 175 MCG PO TABS: ORAL_TABLET | ORAL | 0 refills | Status: DC

## 2017-06-25 ENCOUNTER — Encounter: Payer: Self-pay | Admitting: Primary Care

## 2017-06-25 NOTE — Telephone Encounter (Signed)
Called [facility] and they stated that they do have the Rx but did not fill it.   I have ask the pharmacist to feel the Rx for patient and to contact patient.  I have reply back to patient.

## 2017-07-18 ENCOUNTER — Other Ambulatory Visit: Payer: Self-pay

## 2017-07-18 ENCOUNTER — Inpatient Hospital Stay: Payer: Medicare Other

## 2017-07-18 ENCOUNTER — Inpatient Hospital Stay (HOSPITAL_BASED_OUTPATIENT_CLINIC_OR_DEPARTMENT_OTHER): Payer: Medicare Other | Admitting: Oncology

## 2017-07-18 ENCOUNTER — Other Ambulatory Visit: Payer: Self-pay | Admitting: Primary Care

## 2017-07-18 ENCOUNTER — Inpatient Hospital Stay: Payer: Medicare Other | Attending: Oncology

## 2017-07-18 ENCOUNTER — Encounter: Payer: Self-pay | Admitting: Oncology

## 2017-07-18 VITALS — BP 124/54 | HR 56 | Temp 97.3°F | Wt 170.1 lb

## 2017-07-18 DIAGNOSIS — K219 Gastro-esophageal reflux disease without esophagitis: Secondary | ICD-10-CM | POA: Insufficient documentation

## 2017-07-18 DIAGNOSIS — I6529 Occlusion and stenosis of unspecified carotid artery: Secondary | ICD-10-CM | POA: Insufficient documentation

## 2017-07-18 DIAGNOSIS — E079 Disorder of thyroid, unspecified: Secondary | ICD-10-CM | POA: Diagnosis not present

## 2017-07-18 DIAGNOSIS — Z794 Long term (current) use of insulin: Secondary | ICD-10-CM | POA: Insufficient documentation

## 2017-07-18 DIAGNOSIS — D631 Anemia in chronic kidney disease: Secondary | ICD-10-CM | POA: Insufficient documentation

## 2017-07-18 DIAGNOSIS — Z86711 Personal history of pulmonary embolism: Secondary | ICD-10-CM | POA: Insufficient documentation

## 2017-07-18 DIAGNOSIS — N183 Chronic kidney disease, stage 3 unspecified: Secondary | ICD-10-CM

## 2017-07-18 DIAGNOSIS — E785 Hyperlipidemia, unspecified: Secondary | ICD-10-CM

## 2017-07-18 DIAGNOSIS — I129 Hypertensive chronic kidney disease with stage 1 through stage 4 chronic kidney disease, or unspecified chronic kidney disease: Secondary | ICD-10-CM | POA: Insufficient documentation

## 2017-07-18 DIAGNOSIS — Z7982 Long term (current) use of aspirin: Secondary | ICD-10-CM | POA: Insufficient documentation

## 2017-07-18 DIAGNOSIS — E1122 Type 2 diabetes mellitus with diabetic chronic kidney disease: Secondary | ICD-10-CM | POA: Insufficient documentation

## 2017-07-18 DIAGNOSIS — I2510 Atherosclerotic heart disease of native coronary artery without angina pectoris: Secondary | ICD-10-CM

## 2017-07-18 DIAGNOSIS — Z79899 Other long term (current) drug therapy: Secondary | ICD-10-CM | POA: Insufficient documentation

## 2017-07-18 DIAGNOSIS — D539 Nutritional anemia, unspecified: Secondary | ICD-10-CM

## 2017-07-18 DIAGNOSIS — Z87891 Personal history of nicotine dependence: Secondary | ICD-10-CM | POA: Insufficient documentation

## 2017-07-18 DIAGNOSIS — D649 Anemia, unspecified: Secondary | ICD-10-CM

## 2017-07-18 LAB — CBC WITH DIFFERENTIAL/PLATELET
Basophils Absolute: 0 10*3/uL (ref 0–0.1)
Basophils Relative: 1 %
Eosinophils Absolute: 0.1 10*3/uL (ref 0–0.7)
Eosinophils Relative: 3 %
HCT: 28.5 % — ABNORMAL LOW (ref 40.0–52.0)
Hemoglobin: 9.9 g/dL — ABNORMAL LOW (ref 13.0–18.0)
Lymphocytes Relative: 22 %
Lymphs Abs: 1.1 10*3/uL (ref 1.0–3.6)
MCH: 34.8 pg — ABNORMAL HIGH (ref 26.0–34.0)
MCHC: 34.8 g/dL (ref 32.0–36.0)
MCV: 100 fL (ref 80.0–100.0)
Monocytes Absolute: 0.6 10*3/uL (ref 0.2–1.0)
Monocytes Relative: 13 %
Neutro Abs: 3.1 10*3/uL (ref 1.4–6.5)
Neutrophils Relative %: 61 %
Platelets: 152 10*3/uL (ref 150–440)
RBC: 2.85 MIL/uL — ABNORMAL LOW (ref 4.40–5.90)
RDW: 12.5 % (ref 11.5–14.5)
WBC: 5 10*3/uL (ref 3.8–10.6)

## 2017-07-18 MED ORDER — EPOETIN ALFA 20000 UNIT/ML IJ SOLN
20000.0000 [IU] | Freq: Once | INTRAMUSCULAR | Status: AC
  Administered 2017-07-18: 20000 [IU] via SUBCUTANEOUS
  Filled 2017-07-18: qty 1

## 2017-07-18 NOTE — Progress Notes (Signed)
Hematology/Oncology follow up note [facility] Telephone:(336) [phone] Fax:(336) [phone]   Patient Care Team: Pleas Koch, NP as PCP - General (Internal Medicine) Dingeldein, Remo Lipps, MD as Referring Physician (Ophthalmology) End, Harrell Gave, MD as Consulting Physician (Cardiology)  REFERRING PROVIDER: Dr.Lateef REASON FOR VISIT Follow up for treatment of anemia of CKD, on procrit shots  HISTORY OF PRESENTING ILLNESS:  Tommy [name] Tommy a  84 y.o.  male with PMH listed below who was referred to me for evaluation of anemia.  Patient recently removed from [place] to [place] to live with his son.  He has chronic kidney disease and anemia associated with CKD.  He reports that he has been getting Procrit 10,000 units every 2 weeks for many years.  Denies any recent thrombosis events.  Patient reports that at the last Procrit shot he received was back in September 2018 before he moved here. He establish care with Dr. Holley Raring for his CKD and he was referred to me for evaluation of anemia and possible Procrit shots.  Patient had lab work done at [facility] on 05/15/2017 hemoglobin 9.6, MCV 98, WBC 4.5, platelet count 1 70,000, normal differential use.   Her last colonoscopy about 7 or 8 years ago  [name] Tommy a 84 y.o. male who has above history reviewed by me today presents for follow up visit for management of anemia of CKD.  Patient received Procrit inection 20,000 units 4 weeks ago.  Patient reports feeling the same, no new complaints. He has been recently evaluated by cardiologist for his chronic heart disease.  Fatigue level Tommy the same.     Review of Systems  Constitutional: Positive for malaise/fatigue. Negative for chills, fever and weight loss.  HENT: Negative for congestion, ear discharge, ear pain, hearing loss, nosebleeds, sinus pain and sore throat.   Eyes: Negative for double vision,  photophobia, pain, discharge and redness.  Respiratory: Negative for cough, hemoptysis, sputum production, shortness of breath and wheezing.   Cardiovascular: Negative for chest pain, palpitations, orthopnea, claudication and leg swelling.  Gastrointestinal: Negative for abdominal pain, blood in stool, constipation, diarrhea, heartburn, melena, nausea and vomiting.  Genitourinary: Negative for dysuria, flank pain, frequency, hematuria and urgency.  Musculoskeletal: Negative for back pain, myalgias and neck pain.  Skin: Negative for itching and rash.  Neurological: Negative for dizziness, tingling, tremors, focal weakness, weakness and headaches.  Endo/Heme/Allergies: Negative for environmental allergies. Does not bruise/bleed easily.  Psychiatric/Behavioral: Negative for depression, hallucinations and suicidal ideas. The patient Tommy not nervous/anxious.     MEDICAL HISTORY:  Past Medical History:  Diagnosis Date  . Anemia   . Carotid artery occlusion    LEFT  . CKD (chronic kidney disease) stage 3, GFR 30-59 ml/min (HCC)   . Coronary artery disease   . Diabetes mellitus without complication ([place])   . Dyslipidemia   . GERD (gastroesophageal reflux disease)   . Hemorrhoids   . 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 Hypertension   . Pulmonary emboli ([place])   . Pulmonary embolism ([place])   . Thyroid disease     SURGICAL HISTORY: Past Surgical History:  Procedure Laterality Date  . CAROTID ENDARTERECTOMY Left 2004  . CORONARY ARTERY BYPASS GRAFT  1994   [name]  . TONSILLECTOMY AND ADENOIDECTOMY      SOCIAL HISTORY: Social History   Socioeconomic History  . Marital status: Widowed    Spouse name: Not on file  . Number of children:  4  . Years of education: 30  . Highest education level: Bachelor's degree (e.g., BA, AB, BS)  Occupational History  . Occupation: retired  Scientific laboratory technician  . Financial resource strain: Not hard at all  . Food insecurity:    Worry:  Never true    Inability: Never true  . Transportation needs:    Medical: No    Non-medical: No  Tobacco Use  . Smoking status: Former Smoker    Packs/day: 1.50    Years: 25.00    Pack years: 37.50    Types: Cigarettes    Last attempt to quit: 1978    Years since quitting: 41.4  . Smokeless tobacco: Never Used  Substance and Sexual Activity  . Alcohol use: Yes    Alcohol/week: 0.6 oz    Types: 1 Glasses of wine per week    Comment: socially   . Drug use: No  . Sexual activity: Not Currently  Lifestyle  . Physical activity:    Days per week: Not on file    Minutes per session: Not on file  . Stress: Not on file  Relationships  . Social connections:    Talks on phone: Not on file    Gets together: Not on file    Attends religious service: Not on file    Active member of club or organization: Not on file    Attends meetings of clubs or organizations: Not on file    Relationship status: Not on file  . Intimate partner violence:    Fear of current or ex partner: Not on file    Emotionally abused: Not on file    Physically abused: Not on file    Forced sexual activity: Not on file  Other Topics Concern  . Not on file  Social History Narrative   Widower.   4 children, 7 grandchildren, 1 great grandchild.   Retired. Once worked as an account.    Enjoys exercising, spending time with family.     FAMILY HISTORY: Family History  Problem Relation Age of Onset  . Heart attack Mother   . Heart disease Mother   . Stroke Mother   . Diabetes Father   . Coronary artery disease Sister     ALLERGIES:  Tommy allergic to heparin.  MEDICATIONS:  Current Outpatient Medications  Medication Sig Dispense Refill  . ACCU-CHEK SOFTCLIX LANCETS lancets Use as instruct to test blood sugar once daily 100 each 4  . aspirin 81 MG chewable tablet Chew 81 mg by mouth every evening.    Marland Kitchen atorvastatin (LIPITOR) 40 MG tablet Take 1 tablet (40 mg total) daily by mouth. 90 tablet 3  .  betamethasone valerate (VALISONE) 0.1 % cream Apply topically 2 (two) times daily. 30 g 0  . Blood Glucose Monitoring Suppl (ONETOUCH VERIO) w/Device KIT 1 Device by Does not apply route daily. Use as instructed to test blood sugar daily 1 kit 0  . CALCIUM PO Take 600 mg by mouth every morning.     . carvedilol (COREG) 6.25 MG tablet Take 1 tablet (6.25 mg total) 2 (two) times daily with a meal by mouth. 180 tablet 3  . clopidogrel (PLAVIX) 75 MG tablet Take 1 tablet (75 mg total) daily by mouth. 90 tablet 3  . epoetin alfa (EPOGEN,PROCRIT) 18841 UNIT/ML injection 10,000 Units. Every 2 to 4 weeks    . furosemide (LASIX) 40 MG tablet TAKE 1 TABLET BY MOUTH  DAILY 90 tablet 1  . glimepiride (AMARYL) 4  MG tablet TAKE 1 TABLET BY MOUTH  DAILY WITH BREAKFAST 90 tablet 0  . glucose blood (ONETOUCH VERIO) test strip Use as instructed to test blood sugar once daily 100 each 5  . Hydrocortisone (PROCTOSOL HC RE) Place rectally as needed.    . Insulin Detemir (LEVEMIR) 100 UNIT/ML Pen Inject 14 Units into the skin daily at 10 pm. 15 mL 5  . Insulin Pen Needle 29G X 12.7MM MISC BD Ultrafine Pen Needle Use as instructed to inject insulin daily 300 each 1  . IRON PO Take by mouth 3 (three) times daily.     . isosorbide mononitrate (IMDUR) 30 MG 24 hr tablet Take 1 tablet (30 mg total) by mouth daily. 90 tablet 3  . levothyroxine (SYNTHROID, LEVOTHROID) 175 MCG tablet Take 1 tablet by mouth every morning on an empty stomach with a full glass of water. 90 tablet 1  . losartan (COZAAR) 50 MG tablet Take 1 tablet (50 mg total) by mouth daily. 90 tablet 3  . Multiple Vitamin (MULTIVITAMIN) capsule Take 1 capsule by mouth daily.    . nitroGLYCERIN (NITROSTAT) 0.4 MG SL tablet Place 1 tablet (0.4 mg total) under the tongue every 5 (five) minutes as needed for chest pain. 25 tablet 3  . Omega-3 Fatty Acids (FISH OIL PO) Take 1,200 mg by mouth 4 (four) times daily.    . pantoprazole (PROTONIX) 20 MG tablet TAKE 1  TABLET BY MOUTH  DAILY 90 tablet 0  . timolol (TIMOPTIC) 0.5 % ophthalmic solution APPLY 1 DROP(S) IN BOTH EYES ONCE A DAY  11   No current facility-administered medications for this visit.      PHYSICAL EXAMINATION: ECOG PERFORMANCE STATUS: 1 - Symptomatic but completely ambulatory Vitals:   07/18/17 1348  BP: (!) 124/54  Pulse: (!) 56  Temp: (!) 97.3 F (36.3 C)   Filed Weights   07/18/17 1348  Weight: 170 lb 2 oz (77.2 kg)    Physical Exam  Constitutional: He Tommy oriented to person, place, and time and well-developed, well-nourished, and in no distress. No distress.  HENT:  Head: Normocephalic and atraumatic.  Nose: Nose normal.  Mouth/Throat: Oropharynx Tommy clear and moist. No oropharyngeal exudate.  Eyes: Pupils are equal, round, and reactive to light. EOM are normal. Right eye exhibits no discharge. Left eye exhibits no discharge. No scleral icterus.  Neck: Normal range of motion. Neck supple. No JVD present.  Cardiovascular: Normal rate, regular rhythm and normal heart sounds.  No murmur heard. Pulmonary/Chest: Effort normal and breath sounds normal. No respiratory distress. He has no wheezes. He has no rales. He exhibits no tenderness.  Abdominal: Soft. Bowel sounds are normal. He exhibits no distension and no mass. There Tommy no tenderness. There Tommy no rebound.  Musculoskeletal: Normal range of motion. He exhibits no edema or tenderness.  Lymphadenopathy:    He has no cervical adenopathy.  Neurological: He Tommy alert and oriented to person, place, and time. No cranial nerve deficit. He exhibits normal muscle tone. Coordination normal.  Skin: Skin Tommy warm and dry. No rash noted. He Tommy not diaphoretic. No erythema.  Psychiatric: Affect and judgment normal.     LABORATORY DATA:  I have reviewed the data as listed Lab Results  Component Value Date   WBC 5.0 07/18/2017   HGB 9.9 (L) 07/18/2017   HCT 28.5 (L) 07/18/2017   MCV 100.0 07/18/2017   PLT 152 07/18/2017    Recent Labs    03/19/17 1703 03/25/17 0931 04/01/17  1317  NA 137 138  138  --   K 4.6 5.5*  5.5* 4.6  CL 102 102  102  --   CO2 27 32  32  --   GLUCOSE 79 119*  119*  --   BUN 35* 34*  34*  --   CREATININE 1.78* 1.73*  1.73*  --   CALCIUM 9.3 9.5  9.5  --   GFRNONAA 32*  --   --   GFRAA 38*  --   --   PROT  --  7.3  --   ALBUMIN  --  4.1  --   AST  --  23  --   ALT  --  20  --   ALKPHOS  --  71  --   BILITOT  --  0.4  --      Iron/TIBC/Ferritin/ %Sat    Component Value Date/Time   IRON 109 05/23/2017 1153   TIBC 311 05/23/2017 1153   FERRITIN 670 (H) 05/23/2017 1153   IRONPCTSAT 35 05/23/2017 1153    ASSESSMENT & PLAN:  1. Anemia of chronic renal failure, stage 3 (moderate) (HCC)   2. Macrocytic anemia   3. Stage 3 chronic kidney disease ([place])   4. Fatigue associated with anemia    #Tolerates Procrit 20,000 unit every 4 weeks well.  Today's hemoglobin slightly improved, hemoglobin 9.9.  We will proceed with 20,000 Procrit today.  # Macrocytic anemia, Normal B12 and folate level, TSH. ?MDS.  Stable continue to monitor.  Check SPEP labs  pending today..  All questions were answered. The patient knows to call the clinic with any problems questions or concerns.  Return of visit: Lab and possible Procrit if hemoglobin less or equal to 10 in 4 weeks.  Lab and MD assessment for Procrit shot in 8 weeks.  Thank you for this kind referral and the opportunity to participate in the care of this patient. A copy of today's note Tommy routed to referring provider    Earlie Server, MD, PhD Hematology Oncology [facility] at [facility] Pager- [phone] 07/18/2017

## 2017-07-18 NOTE — Progress Notes (Signed)
Patient here today for follow up.   

## 2017-07-22 LAB — PROTEIN ELECTROPHORESIS, SERUM
A/G Ratio: 1.2 (ref 0.7–1.7)
Albumin ELP: 3.6 g/dL (ref 2.9–4.4)
Alpha-1-Globulin: 0.2 g/dL (ref 0.0–0.4)
Alpha-2-Globulin: 0.8 g/dL (ref 0.4–1.0)
Beta Globulin: 0.8 g/dL (ref 0.7–1.3)
Gamma Globulin: 1.1 g/dL (ref 0.4–1.8)
Globulin, Total: 3 g/dL (ref 2.2–3.9)
Total Protein ELP: 6.6 g/dL (ref 6.0–8.5)

## 2017-08-01 ENCOUNTER — Encounter: Payer: Self-pay | Admitting: Primary Care

## 2017-08-01 DIAGNOSIS — I2510 Atherosclerotic heart disease of native coronary artery without angina pectoris: Secondary | ICD-10-CM

## 2017-08-01 DIAGNOSIS — Z794 Long term (current) use of insulin: Secondary | ICD-10-CM

## 2017-08-01 DIAGNOSIS — K219 Gastro-esophageal reflux disease without esophagitis: Secondary | ICD-10-CM

## 2017-08-01 DIAGNOSIS — E118 Type 2 diabetes mellitus with unspecified complications: Secondary | ICD-10-CM

## 2017-08-01 MED ORDER — PANTOPRAZOLE SODIUM 20 MG PO TBEC: 20.0000 mg | DELAYED_RELEASE_TABLET | Freq: Every day | ORAL | 1 refills | Status: DC

## 2017-08-01 MED ORDER — FUROSEMIDE 40 MG PO TABS: 40.0000 mg | ORAL_TABLET | Freq: Every day | ORAL | 1 refills | Status: DC

## 2017-08-01 MED ORDER — GLIMEPIRIDE 4 MG PO TABS: 4.0000 mg | ORAL_TABLET | Freq: Every day | ORAL | 1 refills | Status: DC

## 2017-08-07 ENCOUNTER — Encounter: Payer: Self-pay | Admitting: Internal Medicine

## 2017-08-14 ENCOUNTER — Other Ambulatory Visit: Payer: Self-pay | Admitting: Primary Care

## 2017-08-14 DIAGNOSIS — I8393 Asymptomatic varicose veins of bilateral lower extremities: Secondary | ICD-10-CM

## 2017-08-14 NOTE — Telephone Encounter (Signed)
Noted, refill sent to pharmacy. 

## 2017-08-14 NOTE — Telephone Encounter (Signed)
Ok to refill? Electronically refill request for betamethasone valerate (VALISONE) 0.1 % cream  Last prescribed on 05/22/2017  Last seen on 03/25/2017

## 2017-08-15 ENCOUNTER — Inpatient Hospital Stay: Payer: Medicare Other | Attending: Oncology

## 2017-08-15 ENCOUNTER — Other Ambulatory Visit: Payer: Self-pay

## 2017-08-15 ENCOUNTER — Inpatient Hospital Stay: Payer: Medicare Other

## 2017-08-15 DIAGNOSIS — N183 Chronic kidney disease, stage 3 unspecified: Secondary | ICD-10-CM

## 2017-08-15 DIAGNOSIS — D649 Anemia, unspecified: Secondary | ICD-10-CM | POA: Insufficient documentation

## 2017-08-15 DIAGNOSIS — D631 Anemia in chronic kidney disease: Secondary | ICD-10-CM

## 2017-08-15 LAB — CBC
HCT: 29.8 % — ABNORMAL LOW (ref 40.0–52.0)
Hemoglobin: 10.3 g/dL — ABNORMAL LOW (ref 13.0–18.0)
MCH: 34.2 pg — ABNORMAL HIGH (ref 26.0–34.0)
MCHC: 34.6 g/dL (ref 32.0–36.0)
MCV: 98.8 fL (ref 80.0–100.0)
Platelets: 139 10*3/uL — ABNORMAL LOW (ref 150–440)
RBC: 3.02 MIL/uL — ABNORMAL LOW (ref 4.40–5.90)
RDW: 12.8 % (ref 11.5–14.5)
WBC: 5.1 10*3/uL (ref 3.8–10.6)

## 2017-09-01 DIAGNOSIS — N183 Chronic kidney disease, stage 3 (moderate): Secondary | ICD-10-CM | POA: Diagnosis not present

## 2017-09-01 DIAGNOSIS — I10 Essential (primary) hypertension: Secondary | ICD-10-CM | POA: Diagnosis not present

## 2017-09-01 DIAGNOSIS — N2581 Secondary hyperparathyroidism of renal origin: Secondary | ICD-10-CM | POA: Diagnosis not present

## 2017-09-01 DIAGNOSIS — D631 Anemia in chronic kidney disease: Secondary | ICD-10-CM | POA: Diagnosis not present

## 2017-09-08 ENCOUNTER — Other Ambulatory Visit: Payer: Self-pay | Admitting: Primary Care

## 2017-09-08 DIAGNOSIS — E039 Hypothyroidism, unspecified: Secondary | ICD-10-CM

## 2017-09-15 ENCOUNTER — Inpatient Hospital Stay: Payer: Medicare Other

## 2017-09-15 ENCOUNTER — Inpatient Hospital Stay: Payer: Medicare Other | Attending: Oncology

## 2017-09-15 ENCOUNTER — Inpatient Hospital Stay (HOSPITAL_BASED_OUTPATIENT_CLINIC_OR_DEPARTMENT_OTHER): Payer: Medicare Other | Admitting: Oncology

## 2017-09-15 ENCOUNTER — Encounter: Payer: Self-pay | Admitting: Oncology

## 2017-09-15 ENCOUNTER — Other Ambulatory Visit: Payer: Self-pay

## 2017-09-15 VITALS — BP 141/61 | HR 56 | Temp 97.8°F | Resp 18 | Wt 168.6 lb

## 2017-09-15 DIAGNOSIS — Z794 Long term (current) use of insulin: Secondary | ICD-10-CM | POA: Diagnosis not present

## 2017-09-15 DIAGNOSIS — I129 Hypertensive chronic kidney disease with stage 1 through stage 4 chronic kidney disease, or unspecified chronic kidney disease: Secondary | ICD-10-CM | POA: Diagnosis not present

## 2017-09-15 DIAGNOSIS — N183 Chronic kidney disease, stage 3 unspecified: Secondary | ICD-10-CM

## 2017-09-15 DIAGNOSIS — E785 Hyperlipidemia, unspecified: Secondary | ICD-10-CM | POA: Diagnosis not present

## 2017-09-15 DIAGNOSIS — D649 Anemia, unspecified: Secondary | ICD-10-CM

## 2017-09-15 DIAGNOSIS — E1122 Type 2 diabetes mellitus with diabetic chronic kidney disease: Secondary | ICD-10-CM | POA: Insufficient documentation

## 2017-09-15 DIAGNOSIS — Z86711 Personal history of pulmonary embolism: Secondary | ICD-10-CM | POA: Insufficient documentation

## 2017-09-15 DIAGNOSIS — Z7982 Long term (current) use of aspirin: Secondary | ICD-10-CM

## 2017-09-15 DIAGNOSIS — H40053 Ocular hypertension, bilateral: Secondary | ICD-10-CM | POA: Diagnosis not present

## 2017-09-15 DIAGNOSIS — K219 Gastro-esophageal reflux disease without esophagitis: Secondary | ICD-10-CM | POA: Insufficient documentation

## 2017-09-15 DIAGNOSIS — Z79899 Other long term (current) drug therapy: Secondary | ICD-10-CM | POA: Diagnosis not present

## 2017-09-15 DIAGNOSIS — Z87891 Personal history of nicotine dependence: Secondary | ICD-10-CM | POA: Insufficient documentation

## 2017-09-15 DIAGNOSIS — E079 Disorder of thyroid, unspecified: Secondary | ICD-10-CM | POA: Insufficient documentation

## 2017-09-15 DIAGNOSIS — I6529 Occlusion and stenosis of unspecified carotid artery: Secondary | ICD-10-CM | POA: Diagnosis not present

## 2017-09-15 DIAGNOSIS — D631 Anemia in chronic kidney disease: Secondary | ICD-10-CM

## 2017-09-15 DIAGNOSIS — D539 Nutritional anemia, unspecified: Secondary | ICD-10-CM | POA: Insufficient documentation

## 2017-09-15 LAB — CBC
HCT: 28.7 % — ABNORMAL LOW (ref 40.0–52.0)
Hemoglobin: 9.9 g/dL — ABNORMAL LOW (ref 13.0–18.0)
MCH: 33.9 pg (ref 26.0–34.0)
MCHC: 34.4 g/dL (ref 32.0–36.0)
MCV: 98.5 fL (ref 80.0–100.0)
Platelets: 128 10*3/uL — ABNORMAL LOW (ref 150–440)
RBC: 2.92 MIL/uL — ABNORMAL LOW (ref 4.40–5.90)
RDW: 13.3 % (ref 11.5–14.5)
WBC: 6.2 10*3/uL (ref 3.8–10.6)

## 2017-09-15 MED ORDER — EPOETIN ALFA 20000 UNIT/ML IJ SOLN
20000.0000 [IU] | Freq: Once | INTRAMUSCULAR | Status: AC
  Administered 2017-09-15: 20000 [IU] via SUBCUTANEOUS
  Filled 2017-09-15: qty 1

## 2017-09-15 NOTE — Progress Notes (Signed)
Hematology/Oncology follow up note [facility] [phone]) [phone] [phone]) [phone]   Patient Care Team: Pleas Koch, NP as PCP - General (Internal Medicine) Dingeldein, Remo Lipps, MD as Referring Physician (Ophthalmology) End, Harrell Gave, MD as Consulting Physician (Cardiology)  REFERRING PROVIDER: Dr.Lateef REASON FOR VISIT Follow up for treatment of anemia of CKD, on procrit shots  HISTORY OF PRESENTING ILLNESS:  Tommy [name] is a  84 y.o.  male with PMH listed below who was referred to me for evaluation of anemia.  Patient recently removed from [place] to [place] to live with his son.  He has chronic kidney disease and anemia associated with CKD.  He reports that he has been getting Procrit 10,000 units every 2 weeks for many years.  Denies any recent thrombosis events.  Patient reports that at the last Procrit shot he received was back in September 2018 before he moved here. He establish care with Dr. Holley Raring for his CKD and he was referred to me for evaluation of anemia and possible Procrit shots.  Patient had lab work done at [facility] on 05/15/2017 hemoglobin 9.6, MCV 98, WBC 4.5, platelet count 1 70,000, normal differential use.   Her last colonoscopy about 7 or 8 years ago  [name] is a 84 y.o. male who has above history reviewed by me today presents for follow up for anemia due to CKD.  Received procrit injection 8 weeks ago. 4 weeks ago his hemoglobin was above 10, so he did not get procrit injection.  Reports feeling well. Fatigue level is stable.  No new compliant.   Review of Systems  Constitutional: Positive for malaise/fatigue. Negative for chills, fever and weight loss.  HENT: Negative for congestion, ear discharge, ear pain, hearing loss, nosebleeds, sinus pain and sore throat.   Eyes: Negative for double vision, photophobia, pain, discharge and redness.  Respiratory:  Negative for cough, hemoptysis, sputum production, shortness of breath and wheezing.   Cardiovascular: Negative for chest pain, palpitations, orthopnea, claudication and leg swelling.  Gastrointestinal: Negative for abdominal pain, blood in stool, constipation, diarrhea, heartburn, melena, nausea and vomiting.  Genitourinary: Negative for dysuria, flank pain, frequency, hematuria and urgency.  Musculoskeletal: Negative for back pain, myalgias and neck pain.  Skin: Negative for itching and rash.  Neurological: Negative for dizziness, tingling, tremors, focal weakness, weakness and headaches.  Endo/Heme/Allergies: Negative for environmental allergies. Does not bruise/bleed easily.  Psychiatric/Behavioral: Negative for depression, hallucinations and suicidal ideas. The patient is not nervous/anxious.     MEDICAL HISTORY:  Past Medical History:  Diagnosis Date  . Anemia   . Carotid artery occlusion    LEFT  . CKD (chronic kidney disease) stage 3, GFR 30-59 ml/min (HCC)   . Coronary artery disease   . Diabetes mellitus without complication ([place])   . Dyslipidemia   . GERD (gastroesophageal reflux disease)   . Hemorrhoids   . 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 Hypertension   . Pulmonary emboli ([place])   . Pulmonary embolism ([place])   . Thyroid disease     SURGICAL HISTORY: Past Surgical History:  Procedure Laterality Date  . CAROTID ENDARTERECTOMY Left 2004  . CORONARY ARTERY BYPASS GRAFT  1994   [name]  . TONSILLECTOMY AND ADENOIDECTOMY      SOCIAL HISTORY: Social History   Socioeconomic History  . Marital status: Widowed    Spouse name: Not on file  . Number of children: 4  . Years of education: 81  .  Highest education level: Bachelor's degree (e.g., BA, AB, BS)  Occupational History  . Occupation: retired  Scientific laboratory technician  . Financial resource strain: Not hard at all  . Food insecurity:    Worry: Never true    Inability: Never true  . Transportation  needs:    Medical: No    Non-medical: No  Tobacco Use  . Smoking status: Former Smoker    Packs/day: 1.50    Years: 25.00    Pack years: 37.50    Types: Cigarettes    Last attempt to quit: 1978    Years since quitting: 41.5  . Smokeless tobacco: Never Used  Substance and Sexual Activity  . Alcohol use: Yes    Alcohol/week: 0.6 oz    Types: 1 Glasses of wine per week    Comment: socially   . Drug use: No  . Sexual activity: Not Currently  Lifestyle  . Physical activity:    Days per week: Not on file    Minutes per session: Not on file  . Stress: Not on file  Relationships  . Social connections:    Talks on phone: Not on file    Gets together: Not on file    Attends religious service: Not on file    Active member of club or organization: Not on file    Attends meetings of clubs or organizations: Not on file    Relationship status: Not on file  . Intimate partner violence:    Fear of current or ex partner: Not on file    Emotionally abused: Not on file    Physically abused: Not on file    Forced sexual activity: Not on file  Other Topics Concern  . Not on file  Social History Narrative   Widower.   4 children, 7 grandchildren, 1 great grandchild.   Retired. Once worked as an account.    Enjoys exercising, spending time with family.     FAMILY HISTORY: Family History  Problem Relation Age of Onset  . Heart attack Mother   . Heart disease Mother   . Stroke Mother   . Diabetes Father   . Coronary artery disease Sister     ALLERGIES:  is allergic to heparin.  MEDICATIONS:  Current Outpatient Medications  Medication Sig Dispense Refill  . ACCU-CHEK SOFTCLIX LANCETS lancets Use as instruct to test blood sugar once daily 100 each 4  . aspirin 81 MG tablet Take 81 mg by mouth every evening.     Marland Kitchen atorvastatin (LIPITOR) 40 MG tablet Take 1 tablet (40 mg total) daily by mouth. 90 tablet 3  . betamethasone valerate (VALISONE) 0.1 % cream APPLY TO AFFECTED AREA TWICE  A DAY 30 g 0  . Blood Glucose Monitoring Suppl (ONETOUCH VERIO) w/Device KIT 1 Device by Does not apply route daily. Use as instructed to test blood sugar daily 1 kit 0  . calcitRIOL (ROCALTROL) 0.25 MCG capsule     . CALCIUM PO Take 600 mg by mouth every morning.     . carvedilol (COREG) 6.25 MG tablet TAKE 1 TABLET BY MOUTH  TWICE A DAY WITH A MEAL 180 tablet 1  . clopidogrel (PLAVIX) 75 MG tablet Take 1 tablet (75 mg total) daily by mouth. 90 tablet 3  . epoetin alfa (EPOGEN,PROCRIT) 90240 UNIT/ML injection 10,000 Units. Every 2 to 4 weeks    . furosemide (LASIX) 40 MG tablet Take 1 tablet (40 mg total) by mouth daily. 90 tablet 1  . glimepiride (AMARYL)  4 MG tablet Take 1 tablet (4 mg total) by mouth daily with breakfast. 90 tablet 1  . glucose blood (ONETOUCH VERIO) test strip Use as instructed to test blood sugar once daily 100 each 5  . Hydrocortisone (PROCTOSOL HC RE) Place rectally as needed.    . Insulin Detemir (LEVEMIR) 100 UNIT/ML Pen Inject 14 Units into the skin daily at 10 pm. 15 mL 5  . Insulin Pen Needle 29G X 12.7MM MISC BD Ultrafine Pen Needle Use as instructed to inject insulin daily 300 each 1  . IRON PO Take by mouth 3 (three) times daily.     Marland Kitchen levothyroxine (SYNTHROID, LEVOTHROID) 175 MCG tablet TAKE 1 TABLET BY MOUTH  EVERY MORNING ON AN EMPTY  STOMACH WITH A FULL GLASS  OF WATER. 90 tablet 0  . Multiple Vitamin (MULTIVITAMIN) capsule Take 1 capsule by mouth daily.    . nitroGLYCERIN (NITROSTAT) 0.4 MG SL tablet Place 1 tablet (0.4 mg total) under the tongue every 5 (five) minutes as needed for chest pain. 25 tablet 3  . Omega-3 Fatty Acids (FISH OIL PO) Take 1,200 mg by mouth 4 (four) times daily.    . pantoprazole (PROTONIX) 20 MG tablet Take 1 tablet (20 mg total) by mouth daily. 90 tablet 1  . ranolazine (RANEXA) 500 MG 12 hr tablet Take 500 mg by mouth 2 (two) times daily.    . timolol (TIMOPTIC) 0.5 % ophthalmic solution APPLY 1 DROP(S) IN BOTH EYES ONCE A DAY  11    . losartan (COZAAR) 50 MG tablet Take 1 tablet (50 mg total) by mouth daily. 90 tablet 3   No current facility-administered medications for this visit.      PHYSICAL EXAMINATION: ECOG PERFORMANCE STATUS: 1 - Symptomatic but completely ambulatory Vitals:   09/15/17 1411  BP: (!) 141/61  Pulse: (!) 56  Resp: 18  Temp: 97.8 F (36.6 C)   Filed Weights   09/15/17 1411  Weight: 168 lb 9.6 oz (76.5 kg)    Physical Exam  Constitutional: He is oriented to person, place, and time and well-developed, well-nourished, and in no distress. No distress.  HENT:  Head: Normocephalic and atraumatic.  Nose: Nose normal.  Mouth/Throat: Oropharynx is clear and moist. No oropharyngeal exudate.  Eyes: Pupils are equal, round, and reactive to light. EOM are normal. Right eye exhibits no discharge. Left eye exhibits no discharge. No scleral icterus.  Neck: Normal range of motion. Neck supple. No JVD present.  Cardiovascular: Normal rate, regular rhythm and normal heart sounds.  No murmur heard. Pulmonary/Chest: Effort normal and breath sounds normal. No respiratory distress. He has no wheezes. He has no rales. He exhibits no tenderness.  Abdominal: Soft. Bowel sounds are normal. He exhibits no distension and no mass. There is no tenderness. There is no rebound.  Musculoskeletal: Normal range of motion. He exhibits no edema or tenderness.  Lymphadenopathy:    He has no cervical adenopathy.  Neurological: He is alert and oriented to person, place, and time. No cranial nerve deficit. He exhibits normal muscle tone. Coordination normal.  Skin: Skin is warm and dry. No rash noted. He is not diaphoretic. No erythema.  Psychiatric: Affect and judgment normal.     LABORATORY DATA:  I have reviewed the data as listed Lab Results  Component Value Date   WBC 6.2 09/15/2017   HGB 9.9 (L) 09/15/2017   HCT 28.7 (L) 09/15/2017   MCV 98.5 09/15/2017   PLT 128 (L) 09/15/2017   Recent  Labs     03/19/17 1703 03/25/17 0931 04/01/17 1317  NA 137 138  138  --   K 4.6 5.5*  5.5* 4.6  CL 102 102  102  --   CO2 27 32  32  --   GLUCOSE 79 119*  119*  --   BUN 35* 34*  34*  --   CREATININE 1.78* 1.73*  1.73*  --   CALCIUM 9.3 9.5  9.5  --   GFRNONAA 32*  --   --   GFRAA 38*  --   --   PROT  --  7.3  --   ALBUMIN  --  4.1  --   AST  --  23  --   ALT  --  20  --   ALKPHOS  --  71  --   BILITOT  --  0.4  --      Iron/TIBC/Ferritin/ %Sat    Component Value Date/Time   IRON 109 05/23/2017 1153   TIBC 311 05/23/2017 1153   FERRITIN 670 (H) 05/23/2017 1153   IRONPCTSAT 35 05/23/2017 1153   SPEP negative for M spike.   ASSESSMENT & PLAN:  1. Anemia of chronic renal failure, stage 3 (moderate) (HCC)   2. Macrocytic anemia   3. Stage 3 chronic kidney disease ([place])   4. Fatigue associated with anemia    # proceed with today's procrit 20,000 units today.  Repeat cbc in 2 months and for assessment of next does of procrit.   # Macrocytic anemia, MCV improving. Continue to monitor.   All questions were answered. The patient knows to call the clinic with any problems questions or concerns.  Return of visit: 8 weeks.  Total face to face encounter time for this patient visit was 15 min. >50% of the time was  spent in counseling and coordination of care.    Earlie Server, MD, PhD Hematology Oncology [facility] at [facility] Pager- [phone] 09/15/2017

## 2017-09-17 ENCOUNTER — Encounter: Payer: Self-pay | Admitting: Internal Medicine

## 2017-09-17 ENCOUNTER — Ambulatory Visit (INDEPENDENT_AMBULATORY_CARE_PROVIDER_SITE_OTHER): Payer: Medicare Other | Admitting: Internal Medicine

## 2017-09-17 VITALS — BP 142/50 | HR 59 | Ht 70.0 in | Wt 166.2 lb

## 2017-09-17 DIAGNOSIS — D638 Anemia in other chronic diseases classified elsewhere: Secondary | ICD-10-CM

## 2017-09-17 DIAGNOSIS — I25118 Atherosclerotic heart disease of native coronary artery with other forms of angina pectoris: Secondary | ICD-10-CM

## 2017-09-17 DIAGNOSIS — I6523 Occlusion and stenosis of bilateral carotid arteries: Secondary | ICD-10-CM | POA: Diagnosis not present

## 2017-09-17 DIAGNOSIS — E785 Hyperlipidemia, unspecified: Secondary | ICD-10-CM

## 2017-09-17 DIAGNOSIS — I10 Essential (primary) hypertension: Secondary | ICD-10-CM | POA: Diagnosis not present

## 2017-09-17 MED ORDER — RANOLAZINE ER 500 MG PO TB12: 500.0000 mg | ORAL_TABLET | Freq: Two times a day (BID) | ORAL | 3 refills | Status: DC

## 2017-09-17 NOTE — Patient Instructions (Signed)
Medication Instructions:  Your physician recommends that you continue on your current medications as directed. Please refer to the Current Medication list given to you today.   Labwork: none  Testing/Procedures: none  Follow-Up: Your physician recommends that you schedule a follow-up appointment in: [place].   If you need a refill on your cardiac medications before your next appointment, please call your pharmacy.

## 2017-09-17 NOTE — Progress Notes (Signed)
Follow-up Outpatient Visit Date: 09/17/2017  Primary Care Provider: Pleas Koch, NP [place] [address]  Chief Complaint: Follow-up stable angina  HPI:  Mr. Aschoff is a 84 y.o. year-old male with history of coronary artery disease status post remote CABG with stable angina, PAD, carotid artery stenosis status post left CEA, hypertension, hyperlipidemia, and type 2 diabetes mellitus, who presents for follow-up of coronary artery disease.  I last saw him in April, at which time he was feeling well.  Exertional dyspnea was back to baseline.  He continued to exercise regularly without difficulty.  He was in the process of being worked up by Dr. Tasia Catchings (hematology) due to chronic anemia.  We agreed to restart isosorbide mononitrate 30 mg daily and discontinue ranolazine due to cost and questionable efficacy.  However, he subsequently had breakthrough chest pain overnight and has gone back to ranolazine.  Today, Mr. Meidinger reports that he is feeling well.  Intermittent exertional dyspnea is unchanged.  He continues to exercise regularly without limitations.  He denies chest pain, palpitations, and edema.  He has brief orthostatic lightheadedness sometimes when he gets up overnight to use the bathroom.  He has not fallen.  He bruises easily but has not had any significant bleeding.  --------------------------------------------------------------------------------------------------  Cardiovascular History & Procedures: Cardiovascular Problems:  Coronary artery disease status post CABG (1994)  Carotid artery stenosis status post left carotid endarterectomy (2004)  Abdominal aortic aneurysm  Risk Factors:  Known coronary artery disease, hypertension, hyperlipidemia, diabetes mellitus, male gender, and age  Cath/PCI:  LHC (02/12/16): Severe native disease with chronic total occlusions of the ostial LAD and LCx as well as 99% stenosis of distal LMCA. 80% ostial RCA stenosis  as well as diffuse mid RCA with tandem lesions of 85-99%. Patent LIMA to LAD and SVG to distal RCA. Additional SVG to unknown vessel is chronically occluded.  CV Surgery:  CABG (1994, [place], [place]): LIMA to LAD, SVG to distal RCA, and SVG to other unknown vessel.  Left carotid endarterectomy (2004)  EP Procedures and Devices:  None  Non-Invasive Evaluation(s):  TTE (02/06/17): Normal LV size. LVEF 55-60% with normal wall motion. Normal diastolic function. Aortic sclerosis without stenosis. Mild MR. Mild left atrial enlargement. Moderate TR. Upper normal to mildly elevated pulmonary artery pressure.  Carotid Doppler (02/06/17): Mild narrowing of bilateral internal carotid arteries (less than 40%. Less than 50% stenosis involving the right common carotid artery. Antegrade vertebral artery flow bilaterally. Normal flow in the subclavian arteries bilaterally.  Carotid artery Doppler (09/14/15): Moderate plaque in the right ICA with less than 50% stenosis.  Pharmacologic MPI (12/16/13): Mild inferior ischemia with LVEF of 58%.  TTE (12/02/13): Mild LVH with LVEF of 65% and grade 1 diastolic dysfunction. Aortic sclerosis, mild TR, and moderate pulmonary hypertension. Question PFO.  Aortoiliac ultrasound (08/09/13): No evidence of AAA. Moderately elevated right common iliac artery velocity with 50-75% stenosis.  Recent CV Pertinent Labs: Lab Results  Component Value Date   CHOL 132 03/25/2017   HDL 42.40 03/25/2017   LDLCALC 61 03/25/2017   TRIG 142.0 03/25/2017   CHOLHDL 3 03/25/2017   K 4.6 04/01/2017   BUN 34 (H) 03/25/2017   BUN 34 (H) 03/25/2017   CREATININE 1.73 (H) 03/25/2017   CREATININE 1.73 (H) 03/25/2017    Past medical and surgical history were reviewed and updated in EPIC.  Current Meds  Medication Sig  . ACCU-CHEK SOFTCLIX LANCETS lancets Use as instruct to test blood sugar  once daily  . aspirin 81 MG tablet Take 81 mg by mouth every evening.   Marland Kitchen  atorvastatin (LIPITOR) 40 MG tablet Take 1 tablet (40 mg total) daily by mouth.  . betamethasone valerate (VALISONE) 0.1 % cream APPLY TO AFFECTED AREA TWICE A DAY  . Blood Glucose Monitoring Suppl (ONETOUCH VERIO) w/Device KIT 1 Device by Does not apply route daily. Use as instructed to test blood sugar daily  . calcitRIOL (ROCALTROL) 0.25 MCG capsule   . CALCIUM PO Take 600 mg by mouth every morning.   . carvedilol (COREG) 6.25 MG tablet TAKE 1 TABLET BY MOUTH  TWICE A DAY WITH A MEAL  . clopidogrel (PLAVIX) 75 MG tablet Take 1 tablet (75 mg total) daily by mouth.  Marland Kitchen epoetin alfa (EPOGEN,PROCRIT) 24580 UNIT/ML injection 10,000 Units. Every 2 to 4 weeks  . furosemide (LASIX) 40 MG tablet Take 1 tablet (40 mg total) by mouth daily.  Marland Kitchen glimepiride (AMARYL) 4 MG tablet Take 1 tablet (4 mg total) by mouth daily with breakfast.  . glucose blood (ONETOUCH VERIO) test strip Use as instructed to test blood sugar once daily  . Hydrocortisone (PROCTOSOL HC RE) Place rectally as needed.  . Insulin Detemir (LEVEMIR) 100 UNIT/ML Pen Inject 14 Units into the skin daily at 10 pm.  . Insulin Pen Needle 29G X 12.7MM MISC BD Ultrafine Pen Needle Use as instructed to inject insulin daily  . IRON PO Take by mouth 3 (three) times daily.   Marland Kitchen levothyroxine (SYNTHROID, LEVOTHROID) 175 MCG tablet TAKE 1 TABLET BY MOUTH  EVERY MORNING ON AN EMPTY  STOMACH WITH A FULL GLASS  OF WATER.  . Multiple Vitamin (MULTIVITAMIN) capsule Take 1 capsule by mouth daily.  . Omega-3 Fatty Acids (FISH OIL PO) Take 1,200 mg by mouth 4 (four) times daily.  . pantoprazole (PROTONIX) 20 MG tablet Take 1 tablet (20 mg total) by mouth daily.  . ranolazine (RANEXA) 500 MG 12 hr tablet Take 500 mg by mouth 2 (two) times daily.  . timolol (TIMOPTIC) 0.5 % ophthalmic solution APPLY 1 DROP(S) IN BOTH EYES ONCE A DAY    Allergies: Heparin  Social History   Tobacco Use  . Smoking status: Former Smoker    Packs/day: 1.50    Years: 25.00     Pack years: 37.50    Types: Cigarettes    Last attempt to quit: 1978    Years since quitting: 41.5  . Smokeless tobacco: Never Used  Substance Use Topics  . Alcohol use: Yes    Alcohol/week: 0.6 oz    Types: 1 Glasses of wine per week    Comment: socially   . Drug use: No    Family History  Problem Relation Age of Onset  . Heart attack Mother   . Heart disease Mother   . Stroke Mother   . Diabetes Father   . Coronary artery disease Sister     Review of Systems: A 12-system review of systems was performed and was negative except as noted in the HPI.  --------------------------------------------------------------------------------------------------  Physical Exam: BP (!) 142/50 (BP Location: Left Arm, Patient Position: Sitting, Cuff Size: Normal)   Pulse (!) 59   Ht '5\' 10"'$  (1.778 m)   Wt 166 lb 4 oz (75.4 kg)   BMI 23.85 kg/m   General: NAD. HEENT: No conjunctival pallor or scleral icterus. Moist mucous membranes.  OP clear. Neck: Supple without lymphadenopathy, thyromegaly, JVD, or HJR. Lungs: Normal work of breathing. Clear to auscultation bilaterally  without wheezes or crackles. Heart: Bradycardic but regular without murmurs, rubs, or gallops. Non-displaced PMI. Abd: Bowel sounds present. Soft, NT/ND without hepatosplenomegaly Ext: No lower extremity edema. Radial, PT, and DP pulses are 2+ bilaterally. Skin: Warm and dry area of ecchymoses noted on forearms.  EKG: Sinus bradycardia (heart rate 59 bpm) with first-degree AV block and right bundle branch block.  No significant change from prior tracing on 06/18/2017.  Lab Results  Component Value Date   WBC 6.2 09/15/2017   HGB 9.9 (L) 09/15/2017   HCT 28.7 (L) 09/15/2017   MCV 98.5 09/15/2017   PLT 128 (L) 09/15/2017    Lab Results  Component Value Date   NA 138 03/25/2017   NA 138 03/25/2017   K 4.6 04/01/2017   CL 102 03/25/2017   CL 102 03/25/2017   CO2 32 03/25/2017   CO2 32 03/25/2017   BUN 34 (H)  03/25/2017   BUN 34 (H) 03/25/2017   CREATININE 1.73 (H) 03/25/2017   CREATININE 1.73 (H) 03/25/2017   GLUCOSE 119 (H) 03/25/2017   GLUCOSE 119 (H) 03/25/2017   ALT 20 03/25/2017    Lab Results  Component Value Date   CHOL 132 03/25/2017   HDL 42.40 03/25/2017   LDLCALC 61 03/25/2017   TRIG 142.0 03/25/2017   CHOLHDL 3 03/25/2017    --------------------------------------------------------------------------------------------------  ASSESSMENT AND PLAN: Coronary artery disease with stable angina Unfortunately, Mr. Penson had breakthrough chest pain at night while on isosorbide mononitrate.  This has resolved after going back to ranolazine.  We will continue ranolazine 500 mg twice daily.  Chronic kidney disease precludes dose escalation.  We have discussed risks and benefits of continued to dual antiplatelet therapy.  Given history of extensive CAD and cerebrovascular disease, Mr. Matney wishes to continue with aspirin and clopidogrel, as he has not had any bleeding or falls.  Hypertension Blood pressure mildly elevated today in the office, though home readings have all been within the normal range.  We have agreed to defer medication changes today.  Continue current doses of carvedilol and losartan.  Hyperlipidemia LDL at goal.  Continue atorvastatin 40 mg daily.  Carotid artery stenosis No focal neurologic symptoms.  Continue secondary prevention.  Will discuss follow-up study when patient returns for follow-up in 6 months.  Anemia of chronic disease Hemoglobin stable.  No significant bleeding.  Continue Procrit injections per nephrology and hematology.  Follow-up: Return to clinic in 6 months.  Nelva Bush, MD 09/17/2017 1:45 PM

## 2017-09-22 ENCOUNTER — Ambulatory Visit: Payer: Medicare Other | Admitting: Primary Care

## 2017-09-22 ENCOUNTER — Ambulatory Visit (INDEPENDENT_AMBULATORY_CARE_PROVIDER_SITE_OTHER): Payer: Medicare Other | Admitting: Primary Care

## 2017-09-22 ENCOUNTER — Encounter: Payer: Self-pay | Admitting: Primary Care

## 2017-09-22 VITALS — BP 142/52 | HR 59 | Temp 98.1°F | Ht 70.0 in | Wt 154.8 lb

## 2017-09-22 DIAGNOSIS — Z794 Long term (current) use of insulin: Secondary | ICD-10-CM | POA: Diagnosis not present

## 2017-09-22 DIAGNOSIS — E118 Type 2 diabetes mellitus with unspecified complications: Secondary | ICD-10-CM | POA: Diagnosis not present

## 2017-09-22 DIAGNOSIS — I6523 Occlusion and stenosis of bilateral carotid arteries: Secondary | ICD-10-CM

## 2017-09-22 LAB — POCT GLYCOSYLATED HEMOGLOBIN (HGB A1C): Hemoglobin A1C: 6.5 % — AB (ref 4.0–5.6)

## 2017-09-22 NOTE — Assessment & Plan Note (Addendum)
Repeat A1C of 6.5 today. He is requesting to reduce his medication, agree this is possible.  Foot exam today unremarkable. Pneumonia vaccination UTD. Managed on ARB and statin. Eye exam UTD.  Continue Levemir at 14 units, Hold Glimepiride at 4 mg for now. Based off of glucose readings he appears very stable. He will notify if readings are at or above 150 consistently.  Follow up in 6 months.

## 2017-09-22 NOTE — Patient Instructions (Addendum)
Stop by the lab prior to leaving today. I will notify you of your results once received.   Continue Levemir 14 units at bedtime, stop glimepiride 4 mg. Continue to monitor your blood sugars and notify me if you get readings at or above 150.  Continue to work on your diet and stay active.  Schedule a lab only appointment in 3 months to recheck A1C.  Please schedule a physical with me in 6 months and a Medicare Wellness Visit with our nurse in 6 months.   It was a pleasure to see you today!

## 2017-09-22 NOTE — Progress Notes (Signed)
Subjective:    Patient ID: Tommy [name], male    DOB: 08/26/1926, 84 y.o.   MRN: [id number]  HPI  Tommy [name] is an 84 year old male who presents today for follow up of type 2 diabetes.  Current medications include: Glimepiride 4 mg, Levemir 14 units at bedtime.   He is checking his blood glucose once daily and is getting readings of: AM fasting: 80's-low 100's  Last A1C: 7.4 in January 2019 Last Eye Exam: Due in November 2019 Last Foot Exam: Due today Pneumonia Vaccination: Completed in 2019 ACE/ARB: Losartan  Statin: Lipitor  Diet currently consists of:  Breakfast: Oatmeal, fiber one cereal, toast Lunch: Skips Dinner: Chicken, lasagna, vegetables, pork chops, starch, salad Snacks: Fruit Desserts: Cookies. Daily Beverages: Water, diet soda, occasionally un-sweet tea  Exercise: He exercises at the [facility] three days weekly.       Review of Systems  Eyes: Negative for visual disturbance.  Respiratory: Negative for shortness of breath.   Cardiovascular: Negative for chest pain.       Chest pain improved with Ranexa  Neurological: Negative for dizziness and numbness.       Past Medical History:  Diagnosis Date  . Anemia   . Carotid artery occlusion    LEFT  . CKD (chronic kidney disease) stage 3, GFR 30-59 ml/min (HCC)   . Coronary artery disease   . Diabetes mellitus without complication ([place])   . Dyslipidemia   . GERD (gastroesophageal reflux disease)   . Hemorrhoids   . 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 Hypertension   . Pulmonary emboli ([place])   . Pulmonary embolism ([place])   . Thyroid disease      Social History   Socioeconomic History  . Marital status: Widowed    Spouse name: Not on file  . Number of children: 4  . Years of education: 16  . Highest education level: Bachelor's degree (e.g., BA, AB, BS)  Occupational History  . Occupation: retired  Scientific laboratory technician  . Financial resource strain: Not hard at all  . Food insecurity:   Worry: Never true    Inability: Never true  . Transportation needs:    Medical: No    Non-medical: No  Tobacco Use  . Smoking status: Former Smoker    Packs/day: 1.50    Years: 25.00    Pack years: 37.50    Types: Cigarettes    Last attempt to quit: 1978    Years since quitting: 41.5  . Smokeless tobacco: Never Used  Substance and Sexual Activity  . Alcohol use: Yes    Alcohol/week: 0.6 oz    Types: 1 Glasses of wine per week    Comment: socially   . Drug use: No  . Sexual activity: Not Currently  Lifestyle  . Physical activity:    Days per week: Not on file    Minutes per session: Not on file  . Stress: Not on file  Relationships  . Social connections:    Talks on phone: Not on file    Gets together: Not on file    Attends religious service: Not on file    Active member of club or organization: Not on file    Attends meetings of clubs or organizations: Not on file    Relationship status: Not on file  . Intimate partner violence:    Fear of current or ex partner: Not on file    Emotionally abused: Not on file    Physically  abused: Not on file    Forced sexual activity: Not on file  Other Topics Concern  . Not on file  Social History Narrative   Widower.   4 children, 7 grandchildren, 1 great grandchild.   Retired. Once worked as an account.    Enjoys exercising, spending time with family.     Past Surgical History:  Procedure Laterality Date  . CAROTID ENDARTERECTOMY Left 2004  . CORONARY ARTERY BYPASS GRAFT  1994   [name]  . TONSILLECTOMY AND ADENOIDECTOMY      Family History  Problem Relation Age of Onset  . Heart attack Mother   . Heart disease Mother   . Stroke Mother   . Diabetes Father   . Coronary artery disease Sister     Allergies  Allergen Reactions  . Heparin     Current Outpatient Medications on File Prior to Visit  Medication Sig Dispense Refill  . ACCU-CHEK SOFTCLIX LANCETS lancets Use as instruct to test blood sugar once  daily 100 each 4  . aspirin 81 MG tablet Take 81 mg by mouth every evening.     Marland Kitchen atorvastatin (LIPITOR) 40 MG tablet Take 1 tablet (40 mg total) daily by mouth. 90 tablet 3  . betamethasone valerate (VALISONE) 0.1 % cream APPLY TO AFFECTED AREA TWICE A DAY 30 g 0  . Blood Glucose Monitoring Suppl (ONETOUCH VERIO) w/Device KIT 1 Device by Does not apply route daily. Use as instructed to test blood sugar daily 1 kit 0  . calcitRIOL (ROCALTROL) 0.25 MCG capsule     . CALCIUM PO Take 600 mg by mouth every morning.     . carvedilol (COREG) 6.25 MG tablet TAKE 1 TABLET BY MOUTH  TWICE A DAY WITH A MEAL 180 tablet 1  . clopidogrel (PLAVIX) 75 MG tablet Take 1 tablet (75 mg total) daily by mouth. 90 tablet 3  . epoetin alfa (EPOGEN,PROCRIT) 45625 UNIT/ML injection 10,000 Units. Every 2 to 4 weeks    . furosemide (LASIX) 40 MG tablet Take 1 tablet (40 mg total) by mouth daily. 90 tablet 1  . glimepiride (AMARYL) 4 MG tablet Take 1 tablet (4 mg total) by mouth daily with breakfast. 90 tablet 1  . glucose blood (ONETOUCH VERIO) test strip Use as instructed to test blood sugar once daily 100 each 5  . Hydrocortisone (PROCTOSOL HC RE) Place rectally as needed.    . Insulin Detemir (LEVEMIR) 100 UNIT/ML Pen Inject 14 Units into the skin daily at 10 pm. 15 mL 5  . Insulin Pen Needle 29G X 12.7MM MISC BD Ultrafine Pen Needle Use as instructed to inject insulin daily 300 each 1  . IRON PO Take by mouth 3 (three) times daily.     Marland Kitchen levothyroxine (SYNTHROID, LEVOTHROID) 175 MCG tablet TAKE 1 TABLET BY MOUTH  EVERY MORNING ON AN EMPTY  STOMACH WITH A FULL GLASS  OF WATER. 90 tablet 0  . Multiple Vitamin (MULTIVITAMIN) capsule Take 1 capsule by mouth daily.    . Omega-3 Fatty Acids (FISH OIL PO) Take 1,200 mg by mouth 4 (four) times daily.    . pantoprazole (PROTONIX) 20 MG tablet Take 1 tablet (20 mg total) by mouth daily. 90 tablet 1  . ranolazine (RANEXA) 500 MG 12 hr tablet Take 1 tablet (500 mg total) by mouth  2 (two) times daily. 180 tablet 3  . timolol (TIMOPTIC) 0.5 % ophthalmic solution APPLY 1 DROP(S) IN BOTH EYES ONCE A DAY  11  .  losartan (COZAAR) 50 MG tablet Take 1 tablet (50 mg total) by mouth daily. 90 tablet 3  . nitroGLYCERIN (NITROSTAT) 0.4 MG SL tablet Place 1 tablet (0.4 mg total) under the tongue every 5 (five) minutes as needed for chest pain. 25 tablet 3   No current facility-administered medications on file prior to visit.     BP (!) 142/52   Pulse (!) 59   Temp 98.1 F (36.7 C) (Oral)   Ht '5\' 10"'$  (1.778 m)   Wt 154 lb 12 oz (70.2 kg)   SpO2 96%   BMI 22.20 kg/m    Objective:   Physical Exam  Constitutional: He appears well-nourished.  Neck: Neck supple.  Cardiovascular: Normal rate and regular rhythm.  Murmur heard. Respiratory: Effort normal and breath sounds normal.  Skin: Skin is warm and dry.  Psychiatric: He has a normal mood and affect.           Assessment & Plan:

## 2017-10-24 ENCOUNTER — Other Ambulatory Visit: Payer: Self-pay | Admitting: Primary Care

## 2017-10-24 DIAGNOSIS — I2510 Atherosclerotic heart disease of native coronary artery without angina pectoris: Secondary | ICD-10-CM

## 2017-11-17 ENCOUNTER — Inpatient Hospital Stay: Payer: Medicare Other | Attending: Oncology | Admitting: Oncology

## 2017-11-17 ENCOUNTER — Other Ambulatory Visit: Payer: Self-pay

## 2017-11-17 ENCOUNTER — Inpatient Hospital Stay: Payer: Medicare Other

## 2017-11-17 ENCOUNTER — Encounter: Payer: Self-pay | Admitting: Oncology

## 2017-11-17 VITALS — BP 133/71 | HR 59 | Temp 98.0°F | Resp 18 | Wt 163.4 lb

## 2017-11-17 DIAGNOSIS — E1122 Type 2 diabetes mellitus with diabetic chronic kidney disease: Secondary | ICD-10-CM | POA: Diagnosis not present

## 2017-11-17 DIAGNOSIS — Z79899 Other long term (current) drug therapy: Secondary | ICD-10-CM | POA: Insufficient documentation

## 2017-11-17 DIAGNOSIS — Z87891 Personal history of nicotine dependence: Secondary | ICD-10-CM | POA: Insufficient documentation

## 2017-11-17 DIAGNOSIS — N183 Chronic kidney disease, stage 3 unspecified: Secondary | ICD-10-CM

## 2017-11-17 DIAGNOSIS — E079 Disorder of thyroid, unspecified: Secondary | ICD-10-CM | POA: Insufficient documentation

## 2017-11-17 DIAGNOSIS — K219 Gastro-esophageal reflux disease without esophagitis: Secondary | ICD-10-CM | POA: Insufficient documentation

## 2017-11-17 DIAGNOSIS — E785 Hyperlipidemia, unspecified: Secondary | ICD-10-CM | POA: Diagnosis not present

## 2017-11-17 DIAGNOSIS — I2510 Atherosclerotic heart disease of native coronary artery without angina pectoris: Secondary | ICD-10-CM | POA: Diagnosis not present

## 2017-11-17 DIAGNOSIS — D631 Anemia in chronic kidney disease: Secondary | ICD-10-CM | POA: Insufficient documentation

## 2017-11-17 DIAGNOSIS — D696 Thrombocytopenia, unspecified: Secondary | ICD-10-CM | POA: Insufficient documentation

## 2017-11-17 DIAGNOSIS — Z7982 Long term (current) use of aspirin: Secondary | ICD-10-CM | POA: Insufficient documentation

## 2017-11-17 DIAGNOSIS — Z86711 Personal history of pulmonary embolism: Secondary | ICD-10-CM | POA: Insufficient documentation

## 2017-11-17 DIAGNOSIS — I129 Hypertensive chronic kidney disease with stage 1 through stage 4 chronic kidney disease, or unspecified chronic kidney disease: Secondary | ICD-10-CM | POA: Diagnosis not present

## 2017-11-17 DIAGNOSIS — Z794 Long term (current) use of insulin: Secondary | ICD-10-CM | POA: Insufficient documentation

## 2017-11-17 DIAGNOSIS — D539 Nutritional anemia, unspecified: Secondary | ICD-10-CM

## 2017-11-17 LAB — CBC
HCT: 26.9 % — ABNORMAL LOW (ref 40.0–52.0)
Hemoglobin: 9.2 g/dL — ABNORMAL LOW (ref 13.0–18.0)
MCH: 34.4 pg — ABNORMAL HIGH (ref 26.0–34.0)
MCHC: 34.2 g/dL (ref 32.0–36.0)
MCV: 100.7 fL — ABNORMAL HIGH (ref 80.0–100.0)
Platelets: 144 10*3/uL — ABNORMAL LOW (ref 150–440)
RBC: 2.67 MIL/uL — ABNORMAL LOW (ref 4.40–5.90)
RDW: 14 % (ref 11.5–14.5)
WBC: 6.3 10*3/uL (ref 3.8–10.6)

## 2017-11-17 MED ORDER — EPOETIN ALFA 20000 UNIT/ML IJ SOLN
20000.0000 [IU] | Freq: Once | INTRAMUSCULAR | Status: AC
  Administered 2017-11-17: 20000 [IU] via SUBCUTANEOUS
  Filled 2017-11-17: qty 1

## 2017-11-18 ENCOUNTER — Other Ambulatory Visit: Payer: Self-pay | Admitting: Primary Care

## 2017-11-18 DIAGNOSIS — E039 Hypothyroidism, unspecified: Secondary | ICD-10-CM

## 2017-11-18 NOTE — Progress Notes (Signed)
Hematology/Oncology follow up note [facility] [phone] [phone]   Patient Care Team: Tommy Koch, NP as PCP - General (Internal Medicine) Tommy, Remo Lipps, MD as Referring Physician (Ophthalmology) [name], Tommy Gave, MD as Consulting Physician (Cardiology)  REFERRING PROVIDER: Dr.Lateef REASON FOR VISIT Follow up for treatment of anemia of CKD, on procrit shots  HISTORY OF PRESENTING ILLNESS:  Tommy [name] Tommy Tommy  84 y.o.  [name] with PMH listed below who was referred to me for evaluation of anemia.  Patient recently removed from [place] to [place] to live with his son.  He has chronic kidney disease and anemia associated with CKD.  He reports that he has been getting Procrit 10,000 units every 2 weeks for many years.  Denies any recent thrombosis events.  Patient reports that at the last Procrit shot he received was back in September 2018 before he moved here. He establish care with Dr. Holley [name] for his CKD and he was referred to me for evaluation of anemia and possible Procrit shots.  Patient had lab work done at [facility] on 05/15/2017 hemoglobin 9.6, MCV 98, WBC 4.5, platelet count 1 70,000, normal differential use.   Her last colonoscopy about 7 or 8 years ago  Tommy [name] Tommy Tommy 84 y.o. [name] who has above history reviewed by me today presents for follow-up for anemia due to CKD. Patient reports feeling at baseline.  Fatigue Tommy chronic at baseline not better or worse.  He has received Procrit injection 20,000 units 8 weeks ago. No new complaints.  Denies any lower extremity swelling, chest pain, shortness of breath, focal weakness.  Review of Systems  Constitutional: Positive for malaise/fatigue. Negative for chills, fever and weight loss.  HENT: Negative for congestion, ear discharge, ear pain, hearing loss, nosebleeds, sinus pain and sore throat.   Eyes: Negative for double  vision, photophobia, pain, discharge and redness.  Respiratory: Negative for cough, hemoptysis, sputum production, shortness of breath and wheezing.   Cardiovascular: Negative for chest pain, palpitations, orthopnea, claudication and leg swelling.  Gastrointestinal: Negative for abdominal pain, blood in stool, constipation, diarrhea, heartburn, melena, nausea and vomiting.  Genitourinary: Negative for dysuria, flank pain, frequency, hematuria and urgency.  Musculoskeletal: Negative for back pain, myalgias and neck pain.  Skin: Negative for itching and rash.  Neurological: Negative for dizziness, tingling, tremors, focal weakness, weakness and headaches.  Endo/Heme/Allergies: Negative for environmental allergies. Does not bruise/bleed easily.  Psychiatric/Behavioral: Negative for depression, hallucinations and suicidal ideas. The patient Tommy not nervous/anxious.     MEDICAL HISTORY:  Past Medical History:  Diagnosis Date  . Anemia   . Carotid artery occlusion    LEFT  . CKD (chronic kidney disease) stage 3, GFR 30-59 ml/min (HCC)   . Coronary artery disease   . Diabetes mellitus without complication ([place])   . Dyslipidemia   . GERD (gastroesophageal reflux disease)   . Hemorrhoids   . 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 Hypertension   . Pulmonary emboli ([place])   . Pulmonary embolism ([place])   . Thyroid disease     SURGICAL HISTORY: Past Surgical History:  Procedure Laterality Date  . CAROTID ENDARTERECTOMY Left 2004  . CORONARY ARTERY BYPASS GRAFT  1994   [name]  . TONSILLECTOMY AND ADENOIDECTOMY      SOCIAL HISTORY: Social History   Socioeconomic History  . Marital status: Widowed    Spouse name: Not on file  . Number of children: 4  .  Years of education: 53  . Highest education level: Bachelor's degree (e.g., BA, AB, BS)  Occupational History  . Occupation: retired  Scientific laboratory technician  . Financial resource strain: Not hard at all  . Food insecurity:     Worry: Never true    Inability: Never true  . Transportation needs:    Medical: No    Non-medical: No  Tobacco Use  . Smoking status: Former Smoker    Packs/day: 1.50    Years: 25.00    Pack years: 37.50    Types: Cigarettes    Last attempt to quit: 1978    Years since quitting: 41.7  . Smokeless tobacco: Never Used  Substance and Sexual Activity  . Alcohol use: Yes    Alcohol/week: 1.0 standard drinks    Types: 1 Glasses of wine per week    Comment: socially   . Drug use: No  . Sexual activity: Not Currently  Lifestyle  . Physical activity:    Days per week: Not on file    Minutes per session: Not on file  . Stress: Not on file  Relationships  . Social connections:    Talks on phone: Not on file    Gets together: Not on file    Attends religious service: Not on file    Active member of club or organization: Not on file    Attends meetings of clubs or organizations: Not on file    Relationship status: Not on file  . Intimate partner violence:    Fear of current or ex partner: Not on file    Emotionally abused: Not on file    Physically abused: Not on file    Forced sexual activity: Not on file  Other Topics Concern  . Not on file  Social History Narrative   Widower.   4 children, 7 grandchildren, 1 great grandchild.   Retired. Once worked as an account.    Enjoys exercising, spending time with family.     FAMILY HISTORY: Family History  Problem Relation Age of Onset  . Heart attack Mother   . Heart disease Mother   . Stroke Mother   . Diabetes Father   . Coronary artery disease Sister     ALLERGIES:  Tommy allergic to heparin.  MEDICATIONS:  Current Outpatient Medications  Medication Sig Dispense Refill  . ACCU-CHEK SOFTCLIX LANCETS lancets Use as instruct to test blood sugar once daily 100 each 4  . aspirin 81 MG tablet Take 81 mg by mouth every evening.     Marland Kitchen atorvastatin (LIPITOR) 40 MG tablet Take 1 tablet (40 mg total) daily by mouth. 90 tablet 3  .  betamethasone valerate (VALISONE) 0.1 % cream APPLY TO AFFECTED AREA TWICE Tommy DAY 30 g 0  . Blood Glucose Monitoring Suppl (ONETOUCH VERIO) w/Device KIT 1 Device by Does not apply route daily. Use as instructed to test blood sugar daily 1 kit 0  . calcitRIOL (ROCALTROL) 0.25 MCG capsule     . CALCIUM PO Take 600 mg by mouth every morning.     . carvedilol (COREG) 6.25 MG tablet TAKE 1 TABLET BY MOUTH  TWICE Tommy DAY WITH Tommy MEAL 180 tablet 1  . epoetin alfa (EPOGEN,PROCRIT) 40981 UNIT/ML injection 10,000 Units. Every 2 to 4 weeks    . furosemide (LASIX) 40 MG tablet TAKE 1 TABLET BY MOUTH  DAILY 90 tablet 1  . glimepiride (AMARYL) 4 MG tablet Take 4 mg by mouth daily with breakfast.    .  glucose blood (ONETOUCH VERIO) test strip Use as instructed to test blood sugar once daily 100 each 5  . Hydrocortisone (PROCTOSOL HC RE) Place rectally as needed.    . Insulin Detemir (LEVEMIR) 100 UNIT/ML Pen Inject 14 Units into the skin daily at 10 pm. 15 mL 5  . Insulin Pen Needle 29G X 12.7MM MISC BD Ultrafine Pen Needle Use as instructed to inject insulin daily 300 each 1  . IRON PO Take by mouth 3 (three) times daily.     Marland Kitchen losartan (COZAAR) 50 MG tablet Take 50 mg by mouth daily.    . Multiple Vitamin (MULTIVITAMIN) capsule Take 1 capsule by mouth daily.    . Omega-3 Fatty Acids (FISH OIL PO) Take 1,200 mg by mouth 4 (four) times daily.    . pantoprazole (PROTONIX) 20 MG tablet Take 1 tablet (20 mg total) by mouth daily. 90 tablet 1  . ranolazine (RANEXA) 500 MG 12 hr tablet Take 1 tablet (500 mg total) by mouth 2 (two) times daily. 180 tablet 3  . timolol (TIMOPTIC) 0.5 % ophthalmic solution APPLY 1 DROP(S) IN BOTH EYES ONCE Tommy DAY  11  . clopidogrel (PLAVIX) 75 MG tablet Take 1 tablet (75 mg total) daily by mouth. (Patient not taking: Reported on 11/17/2017) 90 tablet 3  . levothyroxine (SYNTHROID, LEVOTHROID) 175 MCG tablet TAKE 1 TABLET BY MOUTH  EVERY MORNING ON AN EMPTY  STOMACH WITH Tommy FULL GLASS  OF  WATER. 90 tablet 0  . losartan (COZAAR) 50 MG tablet Take 1 tablet (50 mg total) by mouth daily. 90 tablet 3  . nitroGLYCERIN (NITROSTAT) 0.4 MG SL tablet Place 1 tablet (0.4 mg total) under the tongue every 5 (five) minutes as needed for chest pain. 25 tablet 3   No current facility-administered medications for this visit.      PHYSICAL EXAMINATION: ECOG PERFORMANCE STATUS: 1 - Symptomatic but completely ambulatory Vitals:   11/17/17 1427  BP: 133/71  Pulse: (!) 59  Resp: 18  Temp: 98 F (36.7 C)   Filed Weights   11/17/17 1427  Weight: 163 lb 6.4 oz (74.1 kg)    Physical Exam  Constitutional: He Tommy oriented to person, place, and time. No distress.  HENT:  Head: Normocephalic and atraumatic.  Nose: Nose normal.  Mouth/Throat: Oropharynx Tommy clear and moist. No oropharyngeal exudate.  Eyes: Pupils are equal, round, and reactive to light. EOM are normal. Right eye exhibits no discharge. Left eye exhibits no discharge. No scleral icterus.  Neck: Normal range of motion. Neck supple. No JVD present.  Cardiovascular: Normal rate, regular rhythm and normal heart sounds.  No murmur heard. Pulmonary/Chest: Effort normal and breath sounds normal. No respiratory distress. He has no wheezes. He has no rales.  Abdominal: Soft. Bowel sounds are normal. He exhibits no distension and no mass. There Tommy no tenderness. There Tommy no rebound.  Musculoskeletal: Normal range of motion. He exhibits no edema or tenderness.  Lymphadenopathy:    He has no cervical adenopathy.  Neurological: He Tommy alert and oriented to person, place, and time. No cranial nerve deficit. He exhibits normal muscle tone. Coordination normal.  Skin: Skin Tommy warm and dry. No rash noted. No erythema.  Psychiatric: Affect and judgment normal.     LABORATORY DATA:  I have reviewed the data as listed Lab Results  Component Value Date   WBC 6.3 11/17/2017   HGB 9.2 (L) 11/17/2017   HCT 26.9 (L) 11/17/2017   MCV 100.7 (H)  11/17/2017   PLT 144 (L) 11/17/2017   Recent Labs    03/19/17 1703 03/25/17 0931 04/01/17 1317  NA 137 138  138  --   K 4.6 5.5*  5.5* 4.6  CL 102 102  102  --   CO2 27 32  32  --   GLUCOSE 79 119*  119*  --   BUN 35* 34*  34*  --   CREATININE 1.78* 1.73*  1.73*  --   CALCIUM 9.3 9.5  9.5  --   GFRNONAA 32*  --   --   GFRAA 38*  --   --   PROT  --  7.3  --   ALBUMIN  --  4.1  --   AST  --  23  --   ALT  --  20  --   ALKPHOS  --  71  --   BILITOT  --  0.4  --      Iron/TIBC/Ferritin/ %Sat    Component Value Date/Time   IRON 109 05/23/2017 1153   TIBC 311 05/23/2017 1153   FERRITIN 670 (H) 05/23/2017 1153   IRONPCTSAT 35 05/23/2017 1153   SPEP negative for M spike.   ASSESSMENT & PLAN:  1. Anemia of chronic renal failure, stage 3 (moderate) (HCC)   2. Stage 3 chronic kidney disease ([place])   3. Macrocytic anemia    # labs reviewed and discussed with patient.  Hemoglobin 9.2.  Will proceed with 20,000 units Procrit today. Repeat CBC in 4 weeks and proceed with Procrit 20,000 if hemoglobin less or equal to 10.  #Macrocytic anemia.  MCV borderline elevated.  Continue to monitor. #Thrombocytopenia, platelet counts stable.  Repeat cbc in 2 months and for assessment of next does of procrit.  All questions were answered. The patient knows to call the clinic with any problems questions or concerns. Orders Placed This Encounter  Procedures  . CBC with Differential/Platelet    Standing Status:   Standing    Number of Occurrences:   20    Standing Expiration Date:   11/19/2018    Return of visit: 8 weeks.  Total face to face encounter time for this patient visit was 15 min. >50% of the time was  spent in counseling and coordination of care.    Earlie Server, MD, PhD Hematology Oncology [place] at [facility] Pager- [phone] 11/18/2017

## 2017-11-28 ENCOUNTER — Other Ambulatory Visit: Payer: Self-pay | Admitting: Primary Care

## 2017-11-28 DIAGNOSIS — I8393 Asymptomatic varicose veins of bilateral lower extremities: Secondary | ICD-10-CM

## 2017-11-28 NOTE — Telephone Encounter (Signed)
Electronic refill request Last refill 08/14/17  30G\ Last office visit 09/22/17

## 2017-11-28 NOTE — Telephone Encounter (Signed)
Noted, refill sent to pharmacy. 

## 2017-12-10 ENCOUNTER — Other Ambulatory Visit: Payer: Self-pay | Admitting: Primary Care

## 2017-12-10 DIAGNOSIS — K219 Gastro-esophageal reflux disease without esophagitis: Secondary | ICD-10-CM

## 2017-12-15 ENCOUNTER — Inpatient Hospital Stay: Payer: Medicare Other | Attending: Oncology

## 2017-12-15 ENCOUNTER — Inpatient Hospital Stay: Payer: Medicare Other

## 2017-12-15 VITALS — BP 145/71 | HR 61

## 2017-12-15 DIAGNOSIS — N183 Chronic kidney disease, stage 3 unspecified: Secondary | ICD-10-CM

## 2017-12-15 DIAGNOSIS — Z79899 Other long term (current) drug therapy: Secondary | ICD-10-CM | POA: Insufficient documentation

## 2017-12-15 DIAGNOSIS — D631 Anemia in chronic kidney disease: Secondary | ICD-10-CM | POA: Diagnosis not present

## 2017-12-15 DIAGNOSIS — I129 Hypertensive chronic kidney disease with stage 1 through stage 4 chronic kidney disease, or unspecified chronic kidney disease: Secondary | ICD-10-CM | POA: Insufficient documentation

## 2017-12-15 DIAGNOSIS — D539 Nutritional anemia, unspecified: Secondary | ICD-10-CM

## 2017-12-15 LAB — CBC WITH DIFFERENTIAL/PLATELET
Abs Immature Granulocytes: 0.02 10*3/uL (ref 0.00–0.07)
Basophils Absolute: 0 10*3/uL (ref 0.0–0.1)
Basophils Relative: 0 %
Eosinophils Absolute: 0.2 10*3/uL (ref 0.0–0.5)
Eosinophils Relative: 3 %
HCT: 29 % — ABNORMAL LOW (ref 39.0–52.0)
Hemoglobin: 9.5 g/dL — ABNORMAL LOW (ref 13.0–17.0)
Immature Granulocytes: 0 %
Lymphocytes Relative: 21 %
Lymphs Abs: 1.2 10*3/uL (ref 0.7–4.0)
MCH: 33.1 pg (ref 26.0–34.0)
MCHC: 32.8 g/dL (ref 30.0–36.0)
MCV: 101 fL — ABNORMAL HIGH (ref 80.0–100.0)
Monocytes Absolute: 0.7 10*3/uL (ref 0.1–1.0)
Monocytes Relative: 12 %
Neutro Abs: 3.7 10*3/uL (ref 1.7–7.7)
Neutrophils Relative %: 64 %
Platelets: 130 10*3/uL — ABNORMAL LOW (ref 150–400)
RBC: 2.87 MIL/uL — ABNORMAL LOW (ref 4.22–5.81)
RDW: 12.3 % (ref 11.5–15.5)
WBC: 5.8 10*3/uL (ref 4.0–10.5)
nRBC: 0 % (ref 0.0–0.2)

## 2017-12-15 MED ORDER — EPOETIN ALFA 20000 UNIT/ML IJ SOLN
20000.0000 [IU] | Freq: Once | INTRAMUSCULAR | Status: AC
  Administered 2017-12-15: 20000 [IU] via SUBCUTANEOUS
  Filled 2017-12-15: qty 1

## 2017-12-17 ENCOUNTER — Other Ambulatory Visit: Payer: Self-pay | Admitting: Primary Care

## 2017-12-17 DIAGNOSIS — E119 Type 2 diabetes mellitus without complications: Secondary | ICD-10-CM

## 2017-12-17 DIAGNOSIS — Z794 Long term (current) use of insulin: Secondary | ICD-10-CM

## 2017-12-23 ENCOUNTER — Other Ambulatory Visit: Payer: Medicare Other

## 2017-12-30 ENCOUNTER — Ambulatory Visit (INDEPENDENT_AMBULATORY_CARE_PROVIDER_SITE_OTHER): Payer: Medicare Other

## 2017-12-30 ENCOUNTER — Other Ambulatory Visit (INDEPENDENT_AMBULATORY_CARE_PROVIDER_SITE_OTHER): Payer: Medicare Other

## 2017-12-30 DIAGNOSIS — Z794 Long term (current) use of insulin: Secondary | ICD-10-CM | POA: Diagnosis not present

## 2017-12-30 DIAGNOSIS — Z23 Encounter for immunization: Secondary | ICD-10-CM | POA: Diagnosis not present

## 2017-12-30 DIAGNOSIS — E119 Type 2 diabetes mellitus without complications: Secondary | ICD-10-CM

## 2017-12-30 LAB — POCT GLYCOSYLATED HEMOGLOBIN (HGB A1C): Hemoglobin A1C: 5.8 % — AB (ref 4.0–5.6)

## 2018-01-01 DIAGNOSIS — N184 Chronic kidney disease, stage 4 (severe): Secondary | ICD-10-CM | POA: Diagnosis not present

## 2018-01-01 DIAGNOSIS — N2581 Secondary hyperparathyroidism of renal origin: Secondary | ICD-10-CM | POA: Diagnosis not present

## 2018-01-01 DIAGNOSIS — D631 Anemia in chronic kidney disease: Secondary | ICD-10-CM | POA: Diagnosis not present

## 2018-01-01 DIAGNOSIS — N183 Chronic kidney disease, stage 3 (moderate): Secondary | ICD-10-CM | POA: Diagnosis not present

## 2018-01-01 DIAGNOSIS — I10 Essential (primary) hypertension: Secondary | ICD-10-CM | POA: Diagnosis not present

## 2018-01-05 ENCOUNTER — Other Ambulatory Visit: Payer: Self-pay | Admitting: Primary Care

## 2018-01-09 MED ORDER — INSULIN PEN NEEDLE 31G X 8 MM MISC: 2 refills | Status: DC

## 2018-01-14 ENCOUNTER — Other Ambulatory Visit: Payer: Self-pay | Admitting: Primary Care

## 2018-01-14 DIAGNOSIS — E118 Type 2 diabetes mellitus with unspecified complications: Secondary | ICD-10-CM

## 2018-01-14 DIAGNOSIS — Z794 Long term (current) use of insulin: Secondary | ICD-10-CM

## 2018-01-14 DIAGNOSIS — I2510 Atherosclerotic heart disease of native coronary artery without angina pectoris: Secondary | ICD-10-CM

## 2018-01-19 ENCOUNTER — Inpatient Hospital Stay: Payer: Medicare Other

## 2018-01-19 ENCOUNTER — Ambulatory Visit
Admission: RE | Admit: 2018-01-19 | Discharge: 2018-01-19 | Disposition: A | Discharge status: Home / Self Care | Payer: Medicare Other | Source: Ambulatory Visit | Attending: Oncology | Admitting: Oncology

## 2018-01-19 ENCOUNTER — Other Ambulatory Visit: Payer: Self-pay

## 2018-01-19 ENCOUNTER — Encounter: Payer: Self-pay | Admitting: Oncology

## 2018-01-19 ENCOUNTER — Inpatient Hospital Stay: Payer: Medicare Other | Attending: Oncology

## 2018-01-19 ENCOUNTER — Inpatient Hospital Stay (HOSPITAL_BASED_OUTPATIENT_CLINIC_OR_DEPARTMENT_OTHER): Payer: Medicare Other | Admitting: Oncology

## 2018-01-19 VITALS — BP 153/63 | HR 58 | Temp 96.1°F | Resp 18 | Wt 163.7 lb

## 2018-01-19 DIAGNOSIS — E079 Disorder of thyroid, unspecified: Secondary | ICD-10-CM | POA: Diagnosis not present

## 2018-01-19 DIAGNOSIS — Z86711 Personal history of pulmonary embolism: Secondary | ICD-10-CM | POA: Insufficient documentation

## 2018-01-19 DIAGNOSIS — Z87891 Personal history of nicotine dependence: Secondary | ICD-10-CM | POA: Insufficient documentation

## 2018-01-19 DIAGNOSIS — R490 Dysphonia: Secondary | ICD-10-CM

## 2018-01-19 DIAGNOSIS — I129 Hypertensive chronic kidney disease with stage 1 through stage 4 chronic kidney disease, or unspecified chronic kidney disease: Secondary | ICD-10-CM | POA: Insufficient documentation

## 2018-01-19 DIAGNOSIS — Z79899 Other long term (current) drug therapy: Secondary | ICD-10-CM

## 2018-01-19 DIAGNOSIS — D631 Anemia in chronic kidney disease: Secondary | ICD-10-CM

## 2018-01-19 DIAGNOSIS — Z7982 Long term (current) use of aspirin: Secondary | ICD-10-CM | POA: Insufficient documentation

## 2018-01-19 DIAGNOSIS — E785 Hyperlipidemia, unspecified: Secondary | ICD-10-CM | POA: Insufficient documentation

## 2018-01-19 DIAGNOSIS — R5383 Other fatigue: Secondary | ICD-10-CM | POA: Insufficient documentation

## 2018-01-19 DIAGNOSIS — R05 Cough: Secondary | ICD-10-CM

## 2018-01-19 DIAGNOSIS — Z794 Long term (current) use of insulin: Secondary | ICD-10-CM | POA: Insufficient documentation

## 2018-01-19 DIAGNOSIS — N183 Chronic kidney disease, stage 3 unspecified: Secondary | ICD-10-CM

## 2018-01-19 DIAGNOSIS — D696 Thrombocytopenia, unspecified: Secondary | ICD-10-CM | POA: Diagnosis not present

## 2018-01-19 DIAGNOSIS — D539 Nutritional anemia, unspecified: Secondary | ICD-10-CM

## 2018-01-19 DIAGNOSIS — R531 Weakness: Secondary | ICD-10-CM | POA: Insufficient documentation

## 2018-01-19 DIAGNOSIS — K219 Gastro-esophageal reflux disease without esophagitis: Secondary | ICD-10-CM | POA: Insufficient documentation

## 2018-01-19 DIAGNOSIS — I2510 Atherosclerotic heart disease of native coronary artery without angina pectoris: Secondary | ICD-10-CM | POA: Insufficient documentation

## 2018-01-19 DIAGNOSIS — R059 Cough, unspecified: Secondary | ICD-10-CM

## 2018-01-19 DIAGNOSIS — E119 Type 2 diabetes mellitus without complications: Secondary | ICD-10-CM

## 2018-01-19 DIAGNOSIS — D649 Anemia, unspecified: Secondary | ICD-10-CM

## 2018-01-19 LAB — CBC WITH DIFFERENTIAL/PLATELET
Abs Immature Granulocytes: 0.02 10*3/uL (ref 0.00–0.07)
Basophils Absolute: 0 10*3/uL (ref 0.0–0.1)
Basophils Relative: 1 %
Eosinophils Absolute: 0.1 10*3/uL (ref 0.0–0.5)
Eosinophils Relative: 3 %
HCT: 29.1 % — ABNORMAL LOW (ref 39.0–52.0)
Hemoglobin: 9.8 g/dL — ABNORMAL LOW (ref 13.0–17.0)
Immature Granulocytes: 0 %
Lymphocytes Relative: 23 %
Lymphs Abs: 1.2 10*3/uL (ref 0.7–4.0)
MCH: 34 pg (ref 26.0–34.0)
MCHC: 33.7 g/dL (ref 30.0–36.0)
MCV: 101 fL — ABNORMAL HIGH (ref 80.0–100.0)
Monocytes Absolute: 0.5 10*3/uL (ref 0.1–1.0)
Monocytes Relative: 10 %
Neutro Abs: 3.2 10*3/uL (ref 1.7–7.7)
Neutrophils Relative %: 63 %
Platelets: 120 10*3/uL — ABNORMAL LOW (ref 150–400)
RBC: 2.88 MIL/uL — ABNORMAL LOW (ref 4.22–5.81)
RDW: 12.4 % (ref 11.5–15.5)
WBC: 5.1 10*3/uL (ref 4.0–10.5)
nRBC: 0 % (ref 0.0–0.2)

## 2018-01-19 MED ORDER — EPOETIN ALFA 20000 UNIT/ML IJ SOLN
20000.0000 [IU] | Freq: Once | INTRAMUSCULAR | Status: AC
  Administered 2018-01-19: 20000 [IU] via SUBCUTANEOUS
  Filled 2018-01-19: qty 1

## 2018-01-19 MED ORDER — GLIMEPIRIDE 4 MG PO TABS: 4.0000 mg | ORAL_TABLET | Freq: Every day | ORAL | 1 refills | Status: DC

## 2018-01-19 NOTE — Progress Notes (Signed)
Hematology/Oncology follow up note [facility] Telephone:([phone]) [phone] [phone]) [phone]   Patient Care Team: Tommy Koch, NP as PCP - General (Internal Medicine) [name], Tommy Lipps, MD as Referring Physician (Ophthalmology) [name], Tommy Gave, MD as Consulting Physician (Cardiology)  REFERRING PROVIDER: Dr.Lateef REASON FOR VISIT Follow up for treatment of anemia of CKD, on procrit shots  HISTORY OF PRESENTING ILLNESS:  Tommy [name] is a  84 y.o.  male with PMH listed below who was referred to me for evaluation of anemia.  Patient recently removed from [place] to [place] to live with his son.  He has chronic kidney disease and anemia associated with CKD.  He reports that he has been getting Procrit 10,000 units every 2 weeks for many years.  Denies any recent thrombosis events.  Patient reports that at the last Procrit shot he received was back in September 2018 before he moved here. He establish care with Dr. Holley [name] for his CKD and he was referred to me for evaluation of anemia and possible Procrit shots.  Patient had lab work done at [facility] on 05/15/2017 hemoglobin 9.6, MCV 98, WBC 4.5, platelet count 1 70,000, normal differential use.   Her last colonoscopy about 7 or 8 years ago  [place] is a 84 y.o. male who has above history reviewed by me today presents for follow-up for anemia due to CKD.  Reports feeling tired recently.  Also had a cough, nonproductive.  His voices have been hoarse intermittently for the past few months.  Denies any aggravating or alleviating factors.  Not associated with fever, chills, choking, sore throat or swallowing difficulty.. Received Procrit injection 20,000 unit 4 weeks ago.  Denies any lower extremity swelling, chest pain, shortness of breath or focal weakness.   Review of Systems  Constitutional: Positive for malaise/fatigue. Negative for chills, fever and  weight loss.  HENT: Negative for congestion, ear discharge, ear pain, hearing loss, nosebleeds, sinus pain and sore throat.        Hoarseness  Eyes: Negative for double vision, photophobia, pain, discharge and redness.  Respiratory: Positive for cough. Negative for hemoptysis, sputum production, shortness of breath and wheezing.   Cardiovascular: Negative for chest pain, palpitations, orthopnea, claudication and leg swelling.  Gastrointestinal: Negative for abdominal pain, blood in stool, constipation, diarrhea, heartburn, melena, nausea and vomiting.  Genitourinary: Negative for dysuria, flank pain, frequency, hematuria and urgency.  Musculoskeletal: Negative for back pain, myalgias and neck pain.  Skin: Negative for itching and rash.  Neurological: Negative for dizziness, tingling, tremors, focal weakness, weakness and headaches.  Endo/Heme/Allergies: Negative for environmental allergies. Does not bruise/bleed easily.  Psychiatric/Behavioral: Negative for depression, hallucinations and suicidal ideas. The patient is not nervous/anxious.     MEDICAL HISTORY:  Past Medical History:  Diagnosis Date  . Anemia   . Carotid artery occlusion    LEFT  . CKD (chronic kidney disease) stage 3, GFR 30-59 ml/min (HCC)   . Coronary artery disease   . Diabetes mellitus without complication ([place])   . Dyslipidemia   . GERD (gastroesophageal reflux disease)   . Hemorrhoids   . 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 Hypertension   . Pulmonary emboli ([place])   . Pulmonary embolism ([place])   . Thyroid disease     SURGICAL HISTORY: Past Surgical History:  Procedure Laterality Date  . CAROTID ENDARTERECTOMY Left 2004  . CORONARY ARTERY BYPASS GRAFT  1994   [name]  . TONSILLECTOMY AND ADENOIDECTOMY  SOCIAL HISTORY: Social History   Socioeconomic History  . Marital status: Widowed    Spouse name: Not on file  . Number of children: 4  . Years of education: 16  . Highest  education level: Bachelor's degree (e.g., BA, AB, BS)  Occupational History  . Occupation: retired  Scientific laboratory technician  . Financial resource strain: Not hard at all  . Food insecurity:    Worry: Never true    Inability: Never true  . Transportation needs:    Medical: No    Non-medical: No  Tobacco Use  . Smoking status: Former Smoker    Packs/day: 1.50    Years: 25.00    Pack years: 37.50    Types: Cigarettes    Last attempt to quit: 1978    Years since quitting: 41.9  . Smokeless tobacco: Never Used  Substance and Sexual Activity  . Alcohol use: Yes    Alcohol/week: 1.0 standard drinks    Types: 1 Glasses of wine per week    Comment: socially   . Drug use: No  . Sexual activity: Not Currently  Lifestyle  . Physical activity:    Days per week: Not on file    Minutes per session: Not on file  . Stress: Not on file  Relationships  . Social connections:    Talks on phone: Not on file    Gets together: Not on file    Attends religious service: Not on file    Active member of club or organization: Not on file    Attends meetings of clubs or organizations: Not on file    Relationship status: Not on file  . Intimate partner violence:    Fear of current or ex partner: Not on file    Emotionally abused: Not on file    Physically abused: Not on file    Forced sexual activity: Not on file  Other Topics Concern  . Not on file  Social History Narrative   Widower.   4 children, 7 grandchildren, 1 great grandchild.   Retired. Once worked as an account.    Enjoys exercising, spending time with family.     FAMILY HISTORY: Family History  Problem Relation Age of Onset  . Heart attack Mother   . Heart disease Mother   . Stroke Mother   . Diabetes Father   . Coronary artery disease Sister     ALLERGIES:  is allergic to heparin.  MEDICATIONS:  Current Outpatient Medications  Medication Sig Dispense Refill  . ACCU-CHEK SOFTCLIX LANCETS lancets Use as instruct to test blood  sugar once daily 100 each 4  . aspirin 81 MG tablet Take 81 mg by mouth every evening.     Marland Kitchen atorvastatin (LIPITOR) 40 MG tablet TAKE 1 TABLET BY MOUTH  DAILY 90 tablet 1  . betamethasone valerate (VALISONE) 0.1 % cream APPLY TO AFFECTED AREA TWICE A DAY 30 g 0  . Blood Glucose Monitoring Suppl (ONETOUCH VERIO) w/Device KIT 1 Device by Does not apply route daily. Use as instructed to test blood sugar daily 1 kit 0  . calcitRIOL (ROCALTROL) 0.25 MCG capsule     . CALCIUM PO Take 600 mg by mouth every morning.     . carvedilol (COREG) 6.25 MG tablet TAKE 1 TABLET BY MOUTH  TWICE A DAY WITH MEALS 180 tablet 1  . epoetin alfa (EPOGEN,PROCRIT) 21308 UNIT/ML injection 10,000 Units. Every 2 to 4 weeks    . furosemide (LASIX) 40 MG tablet TAKE 1  TABLET BY MOUTH  DAILY 90 tablet 1  . glucose blood (ONETOUCH VERIO) test strip Use as instructed to test blood sugar once daily 100 each 5  . Hydrocortisone (PROCTOSOL HC RE) Place rectally as needed.    . Insulin Detemir (LEVEMIR) 100 UNIT/ML Pen Inject 14 Units into the skin daily at 10 pm. 15 mL 5  . Insulin Pen Needle 29G X 12.7MM MISC BD Ultrafine Pen Needle Use as instructed to inject insulin daily 300 each 1  . Insulin Pen Needle 31G X 8 MM MISC Use as instructed to inject insulin daily. 300 each 2  . IRON PO Take by mouth 3 (three) times daily.     Marland Kitchen levothyroxine (SYNTHROID, LEVOTHROID) 175 MCG tablet TAKE 1 TABLET BY MOUTH  EVERY MORNING ON AN EMPTY  STOMACH WITH A FULL GLASS  OF WATER. 90 tablet 0  . losartan (COZAAR) 50 MG tablet Take 50 mg by mouth daily.    . Multiple Vitamin (MULTIVITAMIN) capsule Take 1 capsule by mouth daily.    . nitroGLYCERIN (NITROSTAT) 0.4 MG SL tablet PLACE 1 TABLET (0.4 MG TOTAL) UNDER THE TONGUE EVERY 5 (FIVE) MINUTES AS NEEDED FOR CHEST PAIN.  3  . Omega-3 Fatty Acids (FISH OIL PO) Take 1,200 mg by mouth 4 (four) times daily.    . pantoprazole (PROTONIX) 20 MG tablet TAKE 1 TABLET BY MOUTH  DAILY 90 tablet 0  .  ranolazine (RANEXA) 500 MG 12 hr tablet Take 1 tablet (500 mg total) by mouth 2 (two) times daily. 180 tablet 3  . timolol (TIMOPTIC) 0.5 % ophthalmic solution APPLY 1 DROP(S) IN BOTH EYES ONCE A DAY  11  . clopidogrel (PLAVIX) 75 MG tablet Take 1 tablet (75 mg total) daily by mouth. (Patient not taking: Reported on 11/17/2017) 90 tablet 3  . glimepiride (AMARYL) 4 MG tablet Take 1 tablet (4 mg total) by mouth daily with breakfast. 90 tablet 1  . losartan (COZAAR) 50 MG tablet Take 1 tablet (50 mg total) by mouth daily. 90 tablet 3  . nitroGLYCERIN (NITROSTAT) 0.4 MG SL tablet Place 1 tablet (0.4 mg total) under the tongue every 5 (five) minutes as needed for chest pain. 25 tablet 3   No current facility-administered medications for this visit.      PHYSICAL EXAMINATION: ECOG PERFORMANCE STATUS: 1 - Symptomatic but completely ambulatory Vitals:   01/19/18 1331  BP: (!) 153/63  Pulse: (!) 58  Resp: 18  Temp: (!) 96.1 F (35.6 C)   Filed Weights   01/19/18 1331  Weight: 163 lb 11.2 oz (74.3 kg)    Physical Exam  Constitutional: He is oriented to person, place, and time. No distress.  HENT:  Head: Normocephalic and atraumatic.  Nose: Nose normal.  Mouth/Throat: Oropharynx is clear and moist. No oropharyngeal exudate.  Eyes: Pupils are equal, round, and reactive to light. EOM are normal. Right eye exhibits no discharge. Left eye exhibits no discharge. No scleral icterus.  Neck: Normal range of motion. Neck supple. No JVD present.  Cardiovascular: Normal rate, regular rhythm and normal heart sounds.  No murmur heard. Pulmonary/Chest: Effort normal. No respiratory distress.  Left base Rales  Abdominal: Soft. Bowel sounds are normal. He exhibits no distension and no mass. There is no tenderness. There is no rebound.  Musculoskeletal: Normal range of motion. He exhibits no edema or tenderness.  Lymphadenopathy:    He has no cervical adenopathy.  Neurological: He is alert and oriented  to person, place, and time.  No cranial nerve deficit. He exhibits normal muscle tone. Coordination normal.  Skin: Skin is warm and dry. No rash noted. No erythema.  Psychiatric: Affect and judgment normal.     LABORATORY DATA:  I have reviewed the data as listed Lab Results  Component Value Date   WBC 5.1 01/19/2018   HGB 9.8 (L) 01/19/2018   HCT 29.1 (L) 01/19/2018   MCV 101.0 (H) 01/19/2018   PLT 120 (L) 01/19/2018   Recent Labs    03/19/17 1703 03/25/17 0931 04/01/17 1317  NA 137 138  138  --   K 4.6 5.5*  5.5* 4.6  CL 102 102  102  --   CO2 27 32  32  --   GLUCOSE 79 119*  119*  --   BUN 35* 34*  34*  --   CREATININE 1.78* 1.73*  1.73*  --   CALCIUM 9.3 9.5  9.5  --   GFRNONAA 32*  --   --   GFRAA 38*  --   --   PROT  --  7.3  --   ALBUMIN  --  4.1  --   AST  --  23  --   ALT  --  20  --   ALKPHOS  --  71  --   BILITOT  --  0.4  --      Iron/TIBC/Ferritin/ %Sat    Component Value Date/Time   IRON 109 05/23/2017 1153   TIBC 311 05/23/2017 1153   FERRITIN 670 (H) 05/23/2017 1153   IRONPCTSAT 35 05/23/2017 1153   SPEP negative for M spike.   ASSESSMENT & PLAN:  1. Anemia of chronic renal failure, stage 3 (moderate) (HCC)   2. Cough   3. Fatigue associated with anemia   4. Macrocytic anemia   5. Thrombocytopenia ([place])   #Labs reviewed and discussed with patient.  Hemoglobin 9.8.  We will proceed with 20,000 units of Procrit today. Plan check CBC in 4 weeks and proceed with Procrit 20,000 if hemoglobin less than or equal to 10. Follow-up in 8 weeks for assessment for Procrit  #Macrocytic anemia.  MCV borderline elevated.  Continue to monitor #Thrombocytopenia, platelet counts decreased to 1 20,000.  Previous work-up showed normal B12, folate, TSH, Onset of thrombocytopenia in June 2019. ? MDS Continue monitor for now. If rapid declining, will discuss about bone marrow biopsy.   # Cough, hoarseness, former smoker. Check chest XRay All questions  were answered. The patient knows to call the clinic with any problems questions or concerns. Orders Placed This Encounter  Procedures  . DG Chest 2 View    Standing Status:   Future    Number of Occurrences:   1    Standing Expiration Date:   01/19/2019    Order Specific Question:   Reason for Exam (SYMPTOM  OR DIAGNOSIS REQUIRED)    Answer:   cough    Order Specific Question:   Preferred imaging location?    Answer:   [facility]    Order Specific Question:   Radiology Contrast Protocol - do NOT remove file path    Answer:   [email or web address]    Return of visit: 8 weeks.  Total face to face encounter time for this patient visit was 25 min. >50% of the time was  spent in counseling and coordination of care.    Earlie Server, MD, PhD Hematology Oncology [facility] Pager- [phone] 01/19/2018

## 2018-01-19 NOTE — Progress Notes (Signed)
Patient here for follow up. No concerns voiced.  °

## 2018-01-19 NOTE — Addendum Note (Signed)
Addended by: Jacqualin Combes on: 01/19/2018 01:42 PM   Modules accepted: Orders

## 2018-01-27 ENCOUNTER — Encounter: Payer: Self-pay | Admitting: Oncology

## 2018-02-15 ENCOUNTER — Other Ambulatory Visit: Payer: Self-pay | Admitting: Primary Care

## 2018-02-15 DIAGNOSIS — I2510 Atherosclerotic heart disease of native coronary artery without angina pectoris: Secondary | ICD-10-CM

## 2018-02-16 ENCOUNTER — Inpatient Hospital Stay: Payer: Medicare Other | Attending: Oncology

## 2018-02-16 ENCOUNTER — Inpatient Hospital Stay: Payer: Medicare Other

## 2018-02-16 ENCOUNTER — Other Ambulatory Visit: Payer: Self-pay

## 2018-02-16 DIAGNOSIS — N183 Chronic kidney disease, stage 3 unspecified: Secondary | ICD-10-CM

## 2018-02-16 DIAGNOSIS — I129 Hypertensive chronic kidney disease with stage 1 through stage 4 chronic kidney disease, or unspecified chronic kidney disease: Secondary | ICD-10-CM | POA: Insufficient documentation

## 2018-02-16 DIAGNOSIS — R059 Cough, unspecified: Secondary | ICD-10-CM

## 2018-02-16 DIAGNOSIS — D631 Anemia in chronic kidney disease: Secondary | ICD-10-CM | POA: Diagnosis not present

## 2018-02-16 DIAGNOSIS — R05 Cough: Secondary | ICD-10-CM

## 2018-02-16 DIAGNOSIS — D539 Nutritional anemia, unspecified: Secondary | ICD-10-CM

## 2018-02-16 DIAGNOSIS — D696 Thrombocytopenia, unspecified: Secondary | ICD-10-CM

## 2018-02-16 DIAGNOSIS — D649 Anemia, unspecified: Secondary | ICD-10-CM

## 2018-02-16 LAB — CBC WITH DIFFERENTIAL/PLATELET
Abs Immature Granulocytes: 0.02 10*3/uL (ref 0.00–0.07)
Basophils Absolute: 0 10*3/uL (ref 0.0–0.1)
Basophils Relative: 1 %
Eosinophils Absolute: 0.2 10*3/uL (ref 0.0–0.5)
Eosinophils Relative: 3 %
HCT: 31.3 % — ABNORMAL LOW (ref 39.0–52.0)
Hemoglobin: 10.2 g/dL — ABNORMAL LOW (ref 13.0–17.0)
Immature Granulocytes: 0 %
Lymphocytes Relative: 21 %
Lymphs Abs: 1.2 10*3/uL (ref 0.7–4.0)
MCH: 33.1 pg (ref 26.0–34.0)
MCHC: 32.6 g/dL (ref 30.0–36.0)
MCV: 101.6 fL — ABNORMAL HIGH (ref 80.0–100.0)
Monocytes Absolute: 0.5 10*3/uL (ref 0.1–1.0)
Monocytes Relative: 9 %
Neutro Abs: 3.7 10*3/uL (ref 1.7–7.7)
Neutrophils Relative %: 66 %
Platelets: 145 10*3/uL — ABNORMAL LOW (ref 150–400)
RBC: 3.08 MIL/uL — ABNORMAL LOW (ref 4.22–5.81)
RDW: 12.5 % (ref 11.5–15.5)
WBC: 5.7 10*3/uL (ref 4.0–10.5)
nRBC: 0 % (ref 0.0–0.2)

## 2018-02-16 LAB — IMMATURE PLATELET FRACTION: Immature Platelet Fraction: 6.8 % (ref 1.2–8.6)

## 2018-03-10 ENCOUNTER — Other Ambulatory Visit: Payer: Self-pay | Admitting: Primary Care

## 2018-03-10 DIAGNOSIS — I8393 Asymptomatic varicose veins of bilateral lower extremities: Secondary | ICD-10-CM

## 2018-03-16 ENCOUNTER — Inpatient Hospital Stay: Payer: Medicare Other

## 2018-03-16 ENCOUNTER — Inpatient Hospital Stay: Payer: Medicare Other | Admitting: Oncology

## 2018-03-16 DIAGNOSIS — H40053 Ocular hypertension, bilateral: Secondary | ICD-10-CM | POA: Diagnosis not present

## 2018-03-16 DIAGNOSIS — E119 Type 2 diabetes mellitus without complications: Secondary | ICD-10-CM | POA: Diagnosis not present

## 2018-03-16 LAB — HM DIABETES EYE EXAM

## 2018-03-17 ENCOUNTER — Inpatient Hospital Stay (HOSPITAL_BASED_OUTPATIENT_CLINIC_OR_DEPARTMENT_OTHER): Payer: Medicare Other | Admitting: Oncology

## 2018-03-17 ENCOUNTER — Inpatient Hospital Stay: Payer: Medicare Other | Attending: Oncology

## 2018-03-17 ENCOUNTER — Inpatient Hospital Stay: Payer: Medicare Other

## 2018-03-17 ENCOUNTER — Other Ambulatory Visit: Payer: Self-pay

## 2018-03-17 ENCOUNTER — Encounter: Payer: Self-pay | Admitting: Oncology

## 2018-03-17 VITALS — BP 152/59 | HR 56 | Temp 95.9°F | Ht 70.0 in | Wt 164.0 lb

## 2018-03-17 DIAGNOSIS — N183 Chronic kidney disease, stage 3 unspecified: Secondary | ICD-10-CM

## 2018-03-17 DIAGNOSIS — I2510 Atherosclerotic heart disease of native coronary artery without angina pectoris: Secondary | ICD-10-CM

## 2018-03-17 DIAGNOSIS — I129 Hypertensive chronic kidney disease with stage 1 through stage 4 chronic kidney disease, or unspecified chronic kidney disease: Secondary | ICD-10-CM

## 2018-03-17 DIAGNOSIS — Z86711 Personal history of pulmonary embolism: Secondary | ICD-10-CM | POA: Insufficient documentation

## 2018-03-17 DIAGNOSIS — E119 Type 2 diabetes mellitus without complications: Secondary | ICD-10-CM | POA: Diagnosis not present

## 2018-03-17 DIAGNOSIS — Z794 Long term (current) use of insulin: Secondary | ICD-10-CM | POA: Diagnosis not present

## 2018-03-17 DIAGNOSIS — D631 Anemia in chronic kidney disease: Secondary | ICD-10-CM | POA: Insufficient documentation

## 2018-03-17 DIAGNOSIS — E079 Disorder of thyroid, unspecified: Secondary | ICD-10-CM

## 2018-03-17 DIAGNOSIS — Z79899 Other long term (current) drug therapy: Secondary | ICD-10-CM | POA: Diagnosis not present

## 2018-03-17 DIAGNOSIS — R05 Cough: Secondary | ICD-10-CM | POA: Insufficient documentation

## 2018-03-17 DIAGNOSIS — E785 Hyperlipidemia, unspecified: Secondary | ICD-10-CM | POA: Diagnosis not present

## 2018-03-17 DIAGNOSIS — Z87891 Personal history of nicotine dependence: Secondary | ICD-10-CM

## 2018-03-17 DIAGNOSIS — Z7982 Long term (current) use of aspirin: Secondary | ICD-10-CM | POA: Diagnosis not present

## 2018-03-17 DIAGNOSIS — K219 Gastro-esophageal reflux disease without esophagitis: Secondary | ICD-10-CM | POA: Diagnosis not present

## 2018-03-17 DIAGNOSIS — R490 Dysphonia: Secondary | ICD-10-CM

## 2018-03-17 DIAGNOSIS — D696 Thrombocytopenia, unspecified: Secondary | ICD-10-CM

## 2018-03-17 DIAGNOSIS — D539 Nutritional anemia, unspecified: Secondary | ICD-10-CM

## 2018-03-17 LAB — CBC WITH DIFFERENTIAL/PLATELET
Abs Immature Granulocytes: 0.01 10*3/uL (ref 0.00–0.07)
Basophils Absolute: 0 10*3/uL (ref 0.0–0.1)
Basophils Relative: 1 %
Eosinophils Absolute: 0.1 10*3/uL (ref 0.0–0.5)
Eosinophils Relative: 3 %
HCT: 28.3 % — ABNORMAL LOW (ref 39.0–52.0)
Hemoglobin: 9.5 g/dL — ABNORMAL LOW (ref 13.0–17.0)
Immature Granulocytes: 0 %
Lymphocytes Relative: 23 %
Lymphs Abs: 1.2 10*3/uL (ref 0.7–4.0)
MCH: 33.7 pg (ref 26.0–34.0)
MCHC: 33.6 g/dL (ref 30.0–36.0)
MCV: 100.4 fL — ABNORMAL HIGH (ref 80.0–100.0)
Monocytes Absolute: 0.6 10*3/uL (ref 0.1–1.0)
Monocytes Relative: 11 %
Neutro Abs: 3.1 10*3/uL (ref 1.7–7.7)
Neutrophils Relative %: 62 %
Platelets: 130 10*3/uL — ABNORMAL LOW (ref 150–400)
RBC: 2.82 MIL/uL — ABNORMAL LOW (ref 4.22–5.81)
RDW: 12.9 % (ref 11.5–15.5)
WBC: 5 10*3/uL (ref 4.0–10.5)
nRBC: 0 % (ref 0.0–0.2)

## 2018-03-17 MED ORDER — EPOETIN ALFA 20000 UNIT/ML IJ SOLN
20000.0000 [IU] | Freq: Once | INTRAMUSCULAR | Status: AC
  Administered 2018-03-17: 20000 [IU] via SUBCUTANEOUS
  Filled 2018-03-17: qty 1

## 2018-03-17 NOTE — Progress Notes (Signed)
Hematology/Oncology follow up note [facility] Telephone:([phone] [phone]   Patient Care Team: Pleas Koch, NP as PCP - General (Internal Medicine) Dingeldein, Remo Lipps, MD as Referring Physician (Ophthalmology) End, Harrell Gave, MD as Consulting Physician (Cardiology)  REFERRING PROVIDER: Dr.Lateef REASON FOR VISIT Follow up for treatment of anemia of CKD, on procrit shots  HISTORY OF PRESENTING ILLNESS:  Tommy [name] is a  86 y.o.  male with PMH listed below who was referred to me for evaluation of anemia.  Patient recently removed from [place] to [place] to live with his son.  He has chronic kidney disease and anemia associated with CKD.  He reports that he has been getting Procrit 10,000 units every 2 weeks for many years.  Denies any recent thrombosis events.  Patient reports that at the last Procrit shot he received was back in September 2018 before he moved here. He establish care with Dr. Holley Raring for his CKD and he was referred to me for evaluation of anemia and possible Procrit shots.  Patient had lab work done at [facility] on 05/15/2017 hemoglobin 9.6, MCV 98, WBC 4.5, platelet count 1 70,000, normal differential use.   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 He has IVC filter in place.   INTERVAL HISTORY Tommy [name] is a 85 y.o. male who has above history reviewed by me today presents for follow-up for anemia due to CKD.  Patient has been on Erythropoietin therapy monthly, if hemoglobin is less than 10. Last month, his hemoglobin was above 10, so he did not get procrit.  Reports feeling no difference when his hemoglobin is better or worse than 10.  Chronic fatigue has not changed.  Does not any new complaints today. Denies any SOB, chest pain, lower extremity swelling or abdominal pain.      Review of Systems  Constitutional:  Positive for malaise/fatigue. Negative for chills, fever and weight loss.  HENT: Negative for sore throat.   Eyes: Negative for redness.  Respiratory: Negative for cough, shortness of breath and wheezing.   Cardiovascular: Negative for chest pain, palpitations and leg swelling.  Gastrointestinal: Negative for abdominal pain, blood in stool, nausea and vomiting.  Genitourinary: Negative for dysuria.  Musculoskeletal: Negative for myalgias.  Skin: Negative for rash.  Neurological: Negative for dizziness, tingling and tremors.  Endo/Heme/Allergies: Does not bruise/bleed easily.  Psychiatric/Behavioral: Negative for hallucinations.    MEDICAL HISTORY:  Past Medical History:  Diagnosis Date  . Anemia   . Carotid artery occlusion    LEFT  . CKD (chronic kidney disease) stage 3, GFR 30-59 ml/min (HCC)   . Coronary artery disease   . Diabetes mellitus without complication ([place])   . Dyslipidemia   . GERD (gastroesophageal reflux disease)   . Hemorrhoids   . 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 Hypertension   . Pulmonary emboli ([place])   . Pulmonary embolism ([place])   . Thyroid disease     SURGICAL HISTORY: Past Surgical History:  Procedure Laterality Date  . CAROTID ENDARTERECTOMY Left 2004  . CORONARY ARTERY BYPASS GRAFT  1994   [name]  . TONSILLECTOMY AND ADENOIDECTOMY      SOCIAL HISTORY: Social History   Socioeconomic History  . Marital status: Widowed    Spouse name: Not on file  . Number of children: 4  . Years of education: 16  . Highest education level: Bachelor's degree (  e.g., BA, AB, BS)  Occupational History  . Occupation: retired  Scientific laboratory technician  . Financial resource strain: Not hard at all  . Food insecurity:    Worry: Never true    Inability: Never true  . Transportation needs:    Medical: No    Non-medical: No  Tobacco Use  . Smoking status: Former Smoker    Packs/day: 1.50    Years: 25.00    Pack years: 37.50    Types: Cigarettes     Last attempt to quit: 1978    Years since quitting: 42.0  . Smokeless tobacco: Never Used  Substance and Sexual Activity  . Alcohol use: Yes    Alcohol/week: 1.0 standard drinks    Types: 1 Glasses of wine per week    Comment: socially   . Drug use: No  . Sexual activity: Not Currently  Lifestyle  . Physical activity:    Days per week: Not on file    Minutes per session: Not on file  . Stress: Not on file  Relationships  . Social connections:    Talks on phone: Not on file    Gets together: Not on file    Attends religious service: Not on file    Active member of club or organization: Not on file    Attends meetings of clubs or organizations: Not on file    Relationship status: Not on file  . Intimate partner violence:    Fear of current or ex partner: Not on file    Emotionally abused: Not on file    Physically abused: Not on file    Forced sexual activity: Not on file  Other Topics Concern  . Not on file  Social History Narrative   Widower.   4 children, 7 grandchildren, 1 great grandchild.   Retired. Once worked as an account.    Enjoys exercising, spending time with family.     FAMILY HISTORY: Family History  Problem Relation Age of Onset  . Heart attack Mother   . Heart disease Mother   . Stroke Mother   . Diabetes Father   . Coronary artery disease Sister     ALLERGIES:  is allergic to heparin.  MEDICATIONS:  Current Outpatient Medications  Medication Sig Dispense Refill  . ACCU-CHEK SOFTCLIX LANCETS lancets Use as instruct to test blood sugar once daily 100 each 4  . aspirin 81 MG tablet Take 81 mg by mouth every evening.     Marland Kitchen atorvastatin (LIPITOR) 40 MG tablet TAKE 1 TABLET BY MOUTH  DAILY 90 tablet 1  . betamethasone valerate (VALISONE) 0.1 % cream APPLY TO AFFECTED AREA TWICE A DAY 30 g 0  . Blood Glucose Monitoring Suppl (ONETOUCH VERIO) w/Device KIT 1 Device by Does not apply route daily. Use as instructed to test blood sugar daily 1 kit 0  .  calcitRIOL (ROCALTROL) 0.25 MCG capsule     . CALCIUM PO Take 600 mg by mouth every morning.     . carvedilol (COREG) 6.25 MG tablet TAKE 1 TABLET BY MOUTH  TWICE A DAY WITH MEALS 180 tablet 1  . epoetin alfa (EPOGEN,PROCRIT) 01561 UNIT/ML injection 10,000 Units. Every 2 to 4 weeks    . furosemide (LASIX) 40 MG tablet TAKE 1 TABLET BY MOUTH  DAILY 90 tablet 1  . glimepiride (AMARYL) 4 MG tablet Take 1 tablet (4 mg total) by mouth daily with breakfast. 90 tablet 1  . glucose blood (ONETOUCH VERIO) test strip Use as instructed  to test blood sugar once daily 100 each 5  . Hydrocortisone (PROCTOSOL HC RE) Place rectally as needed.    . Insulin Detemir (LEVEMIR) 100 UNIT/ML Pen Inject 14 Units into the skin daily at 10 pm. 15 mL 5  . Insulin Pen Needle 29G X 12.7MM MISC BD Ultrafine Pen Needle Use as instructed to inject insulin daily 300 each 1  . IRON PO Take by mouth 3 (three) times daily.     Marland Kitchen levothyroxine (SYNTHROID, LEVOTHROID) 175 MCG tablet TAKE 1 TABLET BY MOUTH  EVERY MORNING ON AN EMPTY  STOMACH WITH A FULL GLASS  OF WATER. 90 tablet 0  . Multiple Vitamin (MULTIVITAMIN) capsule Take 1 capsule by mouth daily.    . Omega-3 Fatty Acids (FISH OIL PO) Take 1,200 mg by mouth 4 (four) times daily.    . pantoprazole (PROTONIX) 20 MG tablet TAKE 1 TABLET BY MOUTH  DAILY 90 tablet 0  . ranolazine (RANEXA) 500 MG 12 hr tablet Take 1 tablet (500 mg total) by mouth 2 (two) times daily. 180 tablet 3  . timolol (TIMOPTIC) 0.5 % ophthalmic solution APPLY 1 DROP(S) IN BOTH EYES ONCE A DAY  11  . losartan (COZAAR) 50 MG tablet Take 1 tablet (50 mg total) by mouth daily. 90 tablet 3  . nitroGLYCERIN (NITROSTAT) 0.4 MG SL tablet Place 1 tablet (0.4 mg total) under the tongue every 5 (five) minutes as needed for chest pain. 25 tablet 3   No current facility-administered medications for this visit.      PHYSICAL EXAMINATION: ECOG PERFORMANCE STATUS: 1 - Symptomatic but completely ambulatory Vitals:    03/17/18 1400  BP: (!) 152/59  Pulse: (!) 56  Temp: (!) 95.9 F (35.5 C)   Filed Weights   03/17/18 1400  Weight: 164 lb (74.4 kg)    Physical Exam  Constitutional: He is oriented to person, place, and time. No distress.  HENT:  Head: Normocephalic and atraumatic.  Nose: Nose normal.  Mouth/Throat: Oropharynx is clear and moist. No oropharyngeal exudate.  Eyes: Pupils are equal, round, and reactive to light. EOM are normal. Right eye exhibits no discharge. Left eye exhibits no discharge. No scleral icterus.  Neck: Normal range of motion. Neck supple. No JVD present.  Cardiovascular: Normal rate, regular rhythm and normal heart sounds.  No murmur heard. Pulmonary/Chest: Effort normal. No respiratory distress.  Abdominal: Soft. Bowel sounds are normal. He exhibits no distension and no mass. There is no abdominal tenderness. There is no rebound.  Musculoskeletal: Normal range of motion.        General: No tenderness or edema.  Lymphadenopathy:    He has no cervical adenopathy.  Neurological: He is alert and oriented to person, place, and time. No cranial nerve deficit. He exhibits normal muscle tone. Coordination normal.  Skin: Skin is warm and dry. No rash noted. He is not diaphoretic. No erythema.  Psychiatric: Affect and judgment normal.     LABORATORY DATA:  I have reviewed the data as listed Lab Results  Component Value Date   WBC 5.0 03/17/2018   HGB 9.5 (L) 03/17/2018   HCT 28.3 (L) 03/17/2018   MCV 100.4 (H) 03/17/2018   PLT 130 (L) 03/17/2018   Recent Labs    03/19/17 1703 03/25/17 0931 04/01/17 1317  NA 137 138  138  --   K 4.6 5.5*  5.5* 4.6  CL 102 102  102  --   CO2 27 32  32  --   GLUCOSE  79 119*  119*  --   BUN 35* 34*  34*  --   CREATININE 1.78* 1.73*  1.73*  --   CALCIUM 9.3 9.5  9.5  --   GFRNONAA 32*  --   --   GFRAA 38*  --   --   PROT  --  7.3  --   ALBUMIN  --  4.1  --   AST  --  23  --   ALT  --  20  --   ALKPHOS  --  71  --     BILITOT  --  0.4  --      Iron/TIBC/Ferritin/ %Sat    Component Value Date/Time   IRON 109 05/23/2017 1153   TIBC 311 05/23/2017 1153   FERRITIN 670 (H) 05/23/2017 1153   IRONPCTSAT 35 05/23/2017 1153   SPEP negative for M spike.   RADIOGRAPHIC STUDIES: I have personally reviewed the radiological images as listed and agreed with the findings in the report. 01/19/2018 CXR FINDINGS: The lungs are clear and negative for focal airspace consolidation, pulmonary edema or suspicious pulmonary nodule. No pleural effusion or pneumothorax. Cardiac and mediastinal contours are within normal limits. Patient is status post median sternotomy with evidence of prior CABG including LIMA bypass. Trace linear scarring versus atelectasis in the lingula. No acute fracture or lytic or blastic osseous lesions. The visualized upper abdominal bowel gas pattern is unremarkable.  IMPRESSION: No active cardiopulmonary disease. ASSESSMENT & PLAN:  1. Anemia of chronic renal failure, stage 3 (moderate) (HCC)   2. Macrocytic anemia   3. Thrombocytopenia ([place])   #Labs reviewed and discussed with patient.  Hemoglobin 9.5 today  We will proceed with 20,000 units procrit today. . Check H&H in 4 weeks and proceed with Procrit 20,000 if hemoglobin less than or equal to 10. H& H in 8 weeks and proceed with Procrit 20,000 if hemoglobin less than or equal to 10 Follow up with me in the clinic with repeat labs in 12 weeks.   Macrocytic anemia, MCV borderline elevated. Continue to monitor.  #Thrombocytopenia, platelet counts decreased to 1 20,000.  Previous work-up showed normal B12, folate, TSH, Onset of thrombocytopenia in June 2019. ? MDS Platelet counts stable. continue to monitor.  If rapid declining, will discuss about bone marrow biopsy.   # Cough, hoarseness, former smoker. CXR reviewed, which showed no acute changed. Cough is better.   All questions were answered. The patient knows to call the clinic  with any problems questions or concerns. Orders Placed This Encounter  Procedures  . Hemoglobin and Hematocrit, Blood    Standing Status:   Standing    Number of Occurrences:   20    Standing Expiration Date:   03/18/2019  . CBC with Differential/Platelet    Standing Status:   Future    Standing Expiration Date:   03/18/2019  . Hemoglobin    Standing Status:   Future    Standing Expiration Date:   03/18/2019  . Hematocrit    Standing Status:   Future    Standing Expiration Date:   03/18/2019  . Hemoglobin    Standing Status:   Future    Standing Expiration Date:   03/18/2019  . Hematocrit    Standing Status:   Future    Standing Expiration Date:   03/18/2019  . CBC with Differential/Platelet    Standing Status:   Future    Standing Expiration Date:   03/18/2019  . Retic Panel  Standing Status:   Future    Standing Expiration Date:   03/18/2019    Return of visit: 12 weeks.    Earlie Server, MD, PhD Hematology Oncology [facility] Pager- [phone] 03/17/2018

## 2018-03-17 NOTE — Progress Notes (Signed)
Patient here today for follow up and possible procrit injection.   Patient states no new concerns today

## 2018-03-18 ENCOUNTER — Ambulatory Visit (INDEPENDENT_AMBULATORY_CARE_PROVIDER_SITE_OTHER): Payer: Medicare Other | Admitting: Internal Medicine

## 2018-03-18 ENCOUNTER — Encounter: Payer: Self-pay | Admitting: Internal Medicine

## 2018-03-18 ENCOUNTER — Encounter: Payer: Self-pay | Admitting: Primary Care

## 2018-03-18 VITALS — BP 126/50 | HR 56 | Ht 70.0 in | Wt 163.5 lb

## 2018-03-18 DIAGNOSIS — E785 Hyperlipidemia, unspecified: Secondary | ICD-10-CM

## 2018-03-18 DIAGNOSIS — I6523 Occlusion and stenosis of bilateral carotid arteries: Secondary | ICD-10-CM

## 2018-03-18 DIAGNOSIS — I25118 Atherosclerotic heart disease of native coronary artery with other forms of angina pectoris: Secondary | ICD-10-CM | POA: Diagnosis not present

## 2018-03-18 DIAGNOSIS — I10 Essential (primary) hypertension: Secondary | ICD-10-CM | POA: Diagnosis not present

## 2018-03-18 NOTE — Progress Notes (Signed)
Follow-up Outpatient Visit Date: 03/18/2018  Primary Care Provider: Pleas Koch, NP [place] [id number]  Chief Complaint: Chest pain  HPI:  Mr. Dilone is a 85 y.o. year-old male with history of coronary artery disease status post remote CABG with stable angina, PAD, carotid artery stenosis status post left CEA, hypertension, hyperlipidemia, type 2 diabetes mellitus, CKD, and chronic anemia, who presents for follow-up of CAD.  I last saw him in July, at which time he was doing relatively well with stable intermittent exertional dyspnea and occasional orthostatic lightheadedness when getting up to use the bathroom at night.  We had previously switched him from ranolazine to isosorbide mononitrate for cost reasons.  However, he had breakthrough chest pain, prompting [place] to switch back to ranolazine.  We did not make any medication changes at our last visit.  He has continued to follow with Dr. Tasia Catchings (hematology) regarding chronic anemia.  He has been receiving Procrit injections.  Today, Mr. Shells notes that he has experienced a few episodes of chest pain with activity since our last visit.  He recnetly too NTG for the first time in a while.  He has also noticed more exertional dyspnea when exercising at the [facility].  He wonders if he may have been overdoing his exercises.  He denies orthopnea, PND, and edema.  Home BP's usually 120's/60's.  Since our last visit, he has stopped taking clopidogrel.  He remains on ASA 81 mg daily, carvedilol, furosemide 40 mg daily, and ranolazine.  --------------------------------------------------------------------------------------------------  Cardiovascular History & Procedures: Cardiovascular Problems:  Coronary artery disease status post CABG (1994)  Carotid artery stenosis status post left carotid endarterectomy (2004)  Abdominal aortic aneurysm  Risk Factors:  Known coronary artery disease, hypertension, hyperlipidemia, diabetes  mellitus, male gender, and age  Cath/PCI:  LHC (02/12/16): Severe native disease with chronic total occlusions of the ostial LAD and LCx as well as 99% stenosis of distal LMCA. 80% ostial RCA stenosis as well as diffuse mid RCA with tandem lesions of 85-99%. Patent LIMA to LAD and SVG to distal RCA. Additional SVG to unknown vessel is chronically occluded.  CV Surgery:  CABG (1994, [place], [place]): LIMA to LAD, SVG to distal RCA, and SVG to other unknown vessel.  Left carotid endarterectomy (2004)  EP Procedures and Devices:  None  Non-Invasive Evaluation(s):  TTE (02/06/17): Normal LV size. LVEF 55-60% with normal wall motion. Normal diastolic function. Aortic sclerosis without stenosis. Mild MR. Mild left atrial enlargement. Moderate TR. Upper normal to mildly elevated pulmonary artery pressure.  Carotid Doppler (02/06/17): Mild narrowing of bilateral internal carotid arteries (less than 40%. Less than 50% stenosis involving the right common carotid artery. Antegrade vertebral artery flow bilaterally. Normal flow in the subclavian arteries bilaterally.  Carotid artery Doppler (09/14/15): Moderate plaque in the right ICA with less than 50% stenosis.  Pharmacologic MPI (12/16/13): Mild inferior ischemia with LVEF of 58%.  TTE (12/02/13): Mild LVH with LVEF of 65% and grade 1 diastolic dysfunction. Aortic sclerosis, mild TR, and moderate pulmonary hypertension. Question PFO.  Aortoiliac ultrasound (08/09/13): No evidence of AAA. Moderately elevated right common iliac artery velocity with 50-75% stenosis.  Recent CV Pertinent Labs: Lab Results  Component Value Date   CHOL 132 03/25/2017   HDL 42.40 03/25/2017   LDLCALC 61 03/25/2017   TRIG 142.0 03/25/2017   CHOLHDL 3 03/25/2017   K 4.6 04/01/2017   BUN 34 (H) 03/25/2017   BUN 34 (H) 03/25/2017   CREATININE  1.73 (H) 03/25/2017   CREATININE 1.73 (H) 03/25/2017    Past medical and surgical history were  reviewed and updated in EPIC.  Current Meds  Medication Sig  . ACCU-CHEK SOFTCLIX LANCETS lancets Use as instruct to test blood sugar once daily  . aspirin 81 MG tablet Take 81 mg by mouth every evening.   Marland Kitchen atorvastatin (LIPITOR) 40 MG tablet TAKE 1 TABLET BY MOUTH  DAILY  . betamethasone valerate (VALISONE) 0.1 % cream APPLY TO AFFECTED AREA TWICE A DAY  . Blood Glucose Monitoring Suppl (ONETOUCH VERIO) w/Device KIT 1 Device by Does not apply route daily. Use as instructed to test blood sugar daily  . calcitRIOL (ROCALTROL) 0.25 MCG capsule   . CALCIUM PO Take 600 mg by mouth every morning.   . carvedilol (COREG) 6.25 MG tablet TAKE 1 TABLET BY MOUTH  TWICE A DAY WITH MEALS  . epoetin alfa (EPOGEN,PROCRIT) 76720 UNIT/ML injection 10,000 Units. Every 2 to 4 weeks  . furosemide (LASIX) 40 MG tablet TAKE 1 TABLET BY MOUTH  DAILY  . glimepiride (AMARYL) 4 MG tablet Take 1 tablet (4 mg total) by mouth daily with breakfast.  . glucose blood (ONETOUCH VERIO) test strip Use as instructed to test blood sugar once daily  . Hydrocortisone (PROCTOSOL HC RE) Place rectally as needed.  . Insulin Detemir (LEVEMIR) 100 UNIT/ML Pen Inject 14 Units into the skin daily at 10 pm.  . Insulin Pen Needle 29G X 12.7MM MISC BD Ultrafine Pen Needle Use as instructed to inject insulin daily  . IRON PO Take by mouth 3 (three) times daily.   Marland Kitchen levothyroxine (SYNTHROID, LEVOTHROID) 175 MCG tablet TAKE 1 TABLET BY MOUTH  EVERY MORNING ON AN EMPTY  STOMACH WITH A FULL GLASS  OF WATER.  Marland Kitchen losartan (COZAAR) 50 MG tablet Take 1 tablet (50 mg total) by mouth daily.  . Multiple Vitamin (MULTIVITAMIN) capsule Take 1 capsule by mouth daily.  . nitroGLYCERIN (NITROSTAT) 0.4 MG SL tablet Place 1 tablet (0.4 mg total) under the tongue every 5 (five) minutes as needed for chest pain.  . Omega-3 Fatty Acids (FISH OIL PO) Take 1,200 mg by mouth 4 (four) times daily.  . pantoprazole (PROTONIX) 20 MG tablet TAKE 1 TABLET BY MOUTH   DAILY  . ranolazine (RANEXA) 500 MG 12 hr tablet Take 1 tablet (500 mg total) by mouth 2 (two) times daily.  . timolol (TIMOPTIC) 0.5 % ophthalmic solution APPLY 1 DROP(S) IN BOTH EYES ONCE A DAY    Allergies: Heparin  Social History   Tobacco Use  . Smoking status: Former Smoker    Packs/day: 1.50    Years: 25.00    Pack years: 37.50    Types: Cigarettes    Last attempt to quit: 1978    Years since quitting: 42.0  . Smokeless tobacco: Never Used  Substance Use Topics  . Alcohol use: Yes    Alcohol/week: 1.0 standard drinks    Types: 1 Glasses of wine per week    Comment: socially   . Drug use: No    Family History  Problem Relation Age of Onset  . Heart attack Mother   . Heart disease Mother   . Stroke Mother   . Diabetes Father   . Coronary artery disease Sister     Review of Systems: A 12-system review of systems was performed and was negative except as noted in the HPI.  --------------------------------------------------------------------------------------------------  Physical Exam: BP (!) 126/50 (BP Location: Left Arm, Patient  Position: Sitting, Cuff Size: Normal)   Pulse (!) 56   Ht _0  (1.778 m)   Wt 163 lb 8 oz (74.2 kg)   BMI 23.46 kg/m   General:  NAD HEENT: No conjunctival pallor or scleral icterus. Moist mucous membranes.  OP clear. Neck: Supple without lymphadenopathy, thyromegaly, JVD, or HJR. Lungs: Normal work of breathing. Clear to auscultation bilaterally without wheezes or crackles. Heart: Regular rate and rhythm without murmurs, rubs, or gallops. Non-displaced PMI. Abd: Bowel sounds present. Soft, NT/ND without hepatosplenomegaly Ext: No lower extremity edema. Radial, PT, and DP pulses are 2+ bilaterally. Skin: Warm and dry without rash.  EKG:  NSR with RBBB.  PR interval has shortened since last tracing on 09/17/17.  Otherwise, there has been no significant change.  Lab Results  Component Value Date   WBC 5.0 03/17/2018   HGB 9.5  (L) 03/17/2018   HCT 28.3 (L) 03/17/2018   MCV 100.4 (H) 03/17/2018   PLT 130 (L) 03/17/2018    Lab Results  Component Value Date   NA 138 03/25/2017   NA 138 03/25/2017   K 4.6 04/01/2017   CL 102 03/25/2017   CL 102 03/25/2017   CO2 32 03/25/2017   CO2 32 03/25/2017   BUN 34 (H) 03/25/2017   BUN 34 (H) 03/25/2017   CREATININE 1.73 (H) 03/25/2017   CREATININE 1.73 (H) 03/25/2017   GLUCOSE 119 (H) 03/25/2017   GLUCOSE 119 (H) 03/25/2017   ALT 20 03/25/2017    Lab Results  Component Value Date   CHOL 132 03/25/2017   HDL 42.40 03/25/2017   LDLCALC 61 03/25/2017   TRIG 142.0 03/25/2017   CHOLHDL 3 03/25/2017    --------------------------------------------------------------------------------------------------  ASSESSMENT AND PLAN: Coronary artery disease with stable angina Mr. Remedios remains active but has noticed less exercise tolerance due to exertional dyspnea.  He also had to take SL NTG for the first time in a while recently.  Given his cardiac history, this is concerning for progression of CAD.  However, supply-demand mismatch in the setting of chronic anemia is also a consideration.  Hopefully, recent Procrit injection will help with this.  We discussed ischemia evaluation (stress test versus cardiac catheterization).  However, in light of Mr. Nims age and comorbidities (CKD and chronic anemia), he has asked to defer this.  Soft blood pressure, bradycardia, and renal insufficiency preclude escalation of carvedilol and ranolazine.  It is okay to continue using prn NTG.  Mr. Honse was advised to contact us if his symptoms progress further.  Hypertension BP low BP today (asymptomatic).  No medicaiton changes today.  Hyperlipidemia LDL at goal last year.  F/u with PCP planned in the coming weeks.  Continue atorvastatin 40 mg daily for target LDL < 70.  Carotid artery stenosis Continue secondary prevention.  Follow-up: Return to clinic in 6 months.  Nelva Bush,  MD 03/18/2018 9:40 PM

## 2018-03-18 NOTE — Patient Instructions (Signed)
Medication Instructions:  Your physician recommends that you continue on your current medications as directed. Please refer to the Current Medication list given to you today.  If you need a refill on your cardiac medications before your next appointment, please call your pharmacy.   Lab work: none If you have labs (blood work) drawn today and your tests are completely normal, you will receive your results only by: Marland Kitchen MyChart Message (if you have MyChart) OR . A paper copy in the mail If you have any lab test that is abnormal or we need to change your treatment, we will call you to review the results.  Testing/Procedures: none  Follow-Up: At [facility], you and your health needs are our priority.  As part of our continuing mission to provide you with exceptional heart care, we have created designated Provider Care Teams.  These Care Teams include your primary Cardiologist (physician) and Advanced Practice Providers (APPs -  Physician Assistants and Nurse Practitioners) who all work together to provide you with the care you need, when you need it. You will need a follow up appointment in 6 months.  Please call our office 2 months in advance to schedule this appointment.  You may see DR Harrell Gave END or one of the following Advanced Practice Providers on your designated Care Team:   Murray Hodgkins, NP Christell Faith, PA-C . Marrianne Mood, PA-C

## 2018-03-19 ENCOUNTER — Other Ambulatory Visit: Payer: Self-pay | Admitting: Primary Care

## 2018-03-19 DIAGNOSIS — E039 Hypothyroidism, unspecified: Secondary | ICD-10-CM

## 2018-03-19 DIAGNOSIS — K219 Gastro-esophageal reflux disease without esophagitis: Secondary | ICD-10-CM

## 2018-03-23 DIAGNOSIS — Z794 Long term (current) use of insulin: Secondary | ICD-10-CM

## 2018-03-23 DIAGNOSIS — E119 Type 2 diabetes mellitus without complications: Secondary | ICD-10-CM

## 2018-03-23 MED ORDER — LOSARTAN POTASSIUM 50 MG PO TABS: 50.0000 mg | ORAL_TABLET | Freq: Every day | ORAL | 3 refills | Status: DC

## 2018-03-24 MED ORDER — BASAGLAR KWIKPEN 100 UNIT/ML ~~LOC~~ SOPN: 10.0000 [IU] | PEN_INJECTOR | Freq: Every day | SUBCUTANEOUS | 5 refills | Status: DC

## 2018-04-01 ENCOUNTER — Other Ambulatory Visit: Payer: Self-pay | Admitting: Primary Care

## 2018-04-05 ENCOUNTER — Other Ambulatory Visit: Payer: Self-pay | Admitting: Primary Care

## 2018-04-05 DIAGNOSIS — Z794 Long term (current) use of insulin: Secondary | ICD-10-CM

## 2018-04-05 DIAGNOSIS — E039 Hypothyroidism, unspecified: Secondary | ICD-10-CM

## 2018-04-05 DIAGNOSIS — I10 Essential (primary) hypertension: Secondary | ICD-10-CM

## 2018-04-05 DIAGNOSIS — E785 Hyperlipidemia, unspecified: Secondary | ICD-10-CM

## 2018-04-05 DIAGNOSIS — E119 Type 2 diabetes mellitus without complications: Secondary | ICD-10-CM

## 2018-04-06 ENCOUNTER — Ambulatory Visit (INDEPENDENT_AMBULATORY_CARE_PROVIDER_SITE_OTHER): Payer: Medicare Other

## 2018-04-06 VITALS — BP 138/60 | HR 68 | Temp 97.5°F | Ht 70.5 in | Wt 158.8 lb

## 2018-04-06 DIAGNOSIS — Z0000 Encounter for general adult medical examination without abnormal findings: Secondary | ICD-10-CM | POA: Diagnosis not present

## 2018-04-06 DIAGNOSIS — Z794 Long term (current) use of insulin: Secondary | ICD-10-CM | POA: Diagnosis not present

## 2018-04-06 DIAGNOSIS — E785 Hyperlipidemia, unspecified: Secondary | ICD-10-CM

## 2018-04-06 DIAGNOSIS — I10 Essential (primary) hypertension: Secondary | ICD-10-CM | POA: Diagnosis not present

## 2018-04-06 DIAGNOSIS — E119 Type 2 diabetes mellitus without complications: Secondary | ICD-10-CM

## 2018-04-06 DIAGNOSIS — E039 Hypothyroidism, unspecified: Secondary | ICD-10-CM | POA: Diagnosis not present

## 2018-04-06 LAB — COMPREHENSIVE METABOLIC PANEL
ALT: 18 U/L (ref 0–53)
AST: 19 U/L (ref 0–37)
Albumin: 4 g/dL (ref 3.5–5.2)
Alkaline Phosphatase: 71 U/L (ref 39–117)
BUN: 35 mg/dL — ABNORMAL HIGH (ref 6–23)
CO2: 28 mEq/L (ref 19–32)
Calcium: 9.5 mg/dL (ref 8.4–10.5)
Chloride: 103 mEq/L (ref 96–112)
Creatinine, Ser: 1.53 mg/dL — ABNORMAL HIGH (ref 0.40–1.50)
GFR: 43.04 mL/min — ABNORMAL LOW (ref >60.00)
Glucose, Bld: 263 mg/dL — ABNORMAL HIGH (ref 70–99)
Potassium: 4.8 mEq/L (ref 3.5–5.1)
Sodium: 137 mEq/L (ref 135–145)
Total Bilirubin: 0.4 mg/dL (ref 0.2–1.2)
Total Protein: 7.1 g/dL (ref 6.0–8.3)

## 2018-04-06 LAB — TSH: TSH: 1.8 u[IU]/mL (ref 0.35–4.50)

## 2018-04-06 LAB — LIPID PANEL
Cholesterol: 125 mg/dL (ref 0–200)
HDL: 35.6 mg/dL — ABNORMAL LOW (ref >39.00)
LDL Cholesterol: 61 mg/dL (ref 0–99)
NonHDL: 89.15
Total CHOL/HDL Ratio: 4
Triglycerides: 143 mg/dL (ref 0.0–149.0)
VLDL: 28.6 mg/dL (ref 0.0–40.0)

## 2018-04-06 LAB — HEMOGLOBIN A1C: Hgb A1c MFr Bld: 6.8 % — ABNORMAL HIGH (ref 4.6–6.5)

## 2018-04-06 NOTE — Progress Notes (Signed)
Subjective:   Tommy [name] is a 85 y.o. male who presents for Medicare Annual/Subsequent preventive examination.  Review of Systems:  N/A Cardiac Risk Factors include: advanced age (>60mn, >>61women);male gender;diabetes mellitus     Objective:    Vitals: BP 138/60 (BP Location: Right Arm, Patient Position: Sitting, Cuff Size: Normal)   Pulse 68   Temp (!) 97.5 F (36.4 C) (Oral)   Ht 5' 10.5" (1.791 m) Comment: shoes  Wt 158 lb 12 oz (72 kg)   SpO2 98%   BMI 22.46 kg/m   Body mass index is 22.46 kg/m.  Advanced Directives 04/06/2018 03/17/2018 01/19/2018 09/15/2017 07/18/2017 06/20/2017 05/23/2017  Does Patient Have a Medical Advance Directive? Yes Yes Yes Yes Yes Yes Yes  Type of AParamedicof ADe [place]Living will Living will;Healthcare Power of [place] will Living will;Healthcare Power of [place] will  Does patient want to make changes to medical advance directive? - - - - - No - Patient declined -  Copy of HEatons [place] Chart? No - copy requested - - - - No - copy requested No - copy requested    Tobacco Social History   Tobacco Use  Smoking Status Former Smoker  . Packs/day: 1.50  . Years: 25.00  . Pack years: 37.50  . Types: Cigarettes  . Last attempt to quit: 1978  . Years since quitting: 42.1  Smokeless Tobacco Never Used     Counseling given: No   Clinical Intake:  Pre-visit preparation completed: Yes  Pain : No/denies pain Pain Score: 0-No pain     Nutritional Status: BMI 25 -29 Overweight Nutritional Risks: None Diabetes: No  How often do you need to have someone help you when you read instructions, pamphlets, or other written materials from your doctor or pharmacy?: 1 - Never  Interpreter Needed?: No  Information entered by :: LPinson, LPN  Past Medical History:  Diagnosis Date  . Anemia   . Carotid artery occlusion      LEFT  . CKD (chronic kidney disease) stage 3, GFR 30-59 ml/min (HCC)   . Coronary artery disease   . Diabetes mellitus without complication ([place]   . Dyslipidemia   . GERD (gastroesophageal reflux disease)   . Hemorrhoids   . 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 Hypertension   . Pulmonary emboli ([facility]   . Pulmonary embolism ([facility]   . Thyroid disease    Past Surgical History:  Procedure Laterality Date  . CAROTID ENDARTERECTOMY Left 2004  . CORONARY ARTERY BYPASS GRAFT  1994   [name]  . TONSILLECTOMY AND ADENOIDECTOMY     Family History  Problem Relation Age of Onset  . Heart attack Mother   . Heart disease Mother   . Stroke Mother   . Diabetes Father   . Coronary artery disease Sister    Social History   Socioeconomic History  . Marital status: Widowed    Spouse name: Not on file  . Number of children: 4  . Years of education: 16  . Highest education level: Bachelor's degree (e.g., BA, AB, BS)  Occupational History  . Occupation: retired  SScientific laboratory technician . Financial resource strain: Not hard at all  . Food insecurity:    Worry: Never true    Inability: Never true  . Transportation needs:    Medical: No    Non-medical: No  Tobacco Use  . Smoking  status: Former Smoker    Packs/day: 1.50    Years: 25.00    Pack years: 37.50    Types: Cigarettes    Last attempt to quit: 1978    Years since quitting: 42.1  . Smokeless tobacco: Never Used  Substance and Sexual Activity  . Alcohol use: Yes    Alcohol/week: 1.0 standard drinks    Types: 1 Glasses of wine per week    Comment: socially   . Drug use: No  . Sexual activity: Not Currently  Lifestyle  . Physical activity:    Days per week: Not on file    Minutes per session: Not on file  . Stress: Not on file  Relationships  . Social connections:    Talks on phone: Not on file    Gets together: Not on file    Attends religious service: Not on file    Active member of club or organization: Not  on file    Attends meetings of clubs or organizations: Not on file    Relationship status: Not on file  Other Topics Concern  . Not on file  Social History Narrative   Widower.   4 children, 7 grandchildren, 1 great grandchild.   Retired. Once worked as an account.    Enjoys exercising, spending time with family.     Outpatient Encounter Medications as of 04/06/2018  Medication Sig  . aspirin 81 MG tablet Take 81 mg by mouth every evening.   Marland Kitchen atorvastatin (LIPITOR) 40 MG tablet TAKE 1 TABLET BY MOUTH  DAILY  . betamethasone valerate (VALISONE) 0.1 % cream APPLY TO AFFECTED AREA TWICE A DAY  . Blood Glucose Monitoring Suppl (ONETOUCH VERIO) w/Device KIT 1 Device by Does not apply route daily. Use as instructed to test blood sugar daily  . calcitRIOL (ROCALTROL) 0.25 MCG capsule   . CALCIUM PO Take 600 mg by mouth every morning.   . carvedilol (COREG) 6.25 MG tablet TAKE 1 TABLET BY MOUTH  TWICE A DAY WITH MEALS  . epoetin alfa (EPOGEN,PROCRIT) 35573 UNIT/ML injection 10,000 Units. Every 2 to 4 weeks  . furosemide (LASIX) 40 MG tablet TAKE 1 TABLET BY MOUTH  DAILY  . glimepiride (AMARYL) 4 MG tablet Take 1 tablet (4 mg total) by mouth daily with breakfast.  . glucose blood (ONETOUCH VERIO) test strip Use as instructed to test blood sugar once daily  . Hydrocortisone (PROCTOSOL HC RE) Place rectally as needed.  . Insulin Detemir (LEVEMIR) 100 UNIT/ML Pen Inject 14 Units into the skin daily at 10 pm.  . Insulin Pen Needle 29G X 12.7MM MISC BD Ultrafine Pen Needle Use as instructed to inject insulin daily  . IRON PO Take by mouth 3 (three) times daily.   Marland Kitchen levothyroxine (SYNTHROID, LEVOTHROID) 175 MCG tablet TAKE 1 TABLET BY MOUTH  EVERY MORNING ON AN EMPTY  STOMACH WITH A FULL GLASS  OF WATER.  Marland Kitchen losartan (COZAAR) 50 MG tablet Take 1 tablet (50 mg total) by mouth daily.  . Multiple Vitamin (MULTIVITAMIN) capsule Take 1 capsule by mouth daily.  . Omega-3 Fatty Acids (FISH OIL PO) Take 1,200  mg by mouth 4 (four) times daily.  Glory Rosebush DELICA LANCETS 22G MISC USE AS INSTRUCT TO TEST BLOOD SUGAR ONCE DAILY  . pantoprazole (PROTONIX) 20 MG tablet TAKE 1 TABLET BY MOUTH  DAILY  . ranolazine (RANEXA) 500 MG 12 hr tablet Take 1 tablet (500 mg total) by mouth 2 (two) times daily.  . timolol (TIMOPTIC) 0.5 %  ophthalmic solution APPLY 1 DROP(S) IN BOTH EYES ONCE A DAY  . Insulin Glargine (BASAGLAR KWIKPEN) 100 UNIT/ML SOPN Inject 0.1 mLs (10 Units total) into the skin at bedtime. For diabetes. (Patient not taking: Reported on 04/06/2018)  . nitroGLYCERIN (NITROSTAT) 0.4 MG SL tablet Place 1 tablet (0.4 mg total) under the tongue every 5 (five) minutes as needed for chest pain.   No facility-administered encounter medications on file as of 04/06/2018.     Activities of Daily Living In your present state of health, do you have any difficulty performing the following activities: 04/06/2018  Hearing? Y  Vision? N  Difficulty concentrating or making decisions? N  Walking or climbing stairs? Y  Dressing or bathing? N  Doing errands, shopping? N  Preparing Food and eating ? N  Using the Toilet? N  In the past six months, have you accidently leaked urine? N  Do you have problems with loss of bowel control? N  Managing your Medications? N  Managing your Finances? N  Housekeeping or managing your Housekeeping? N  Some recent data might be hidden    Patient Care Team: Pleas Koch, NP as PCP - General (Internal Medicine) Estill Cotta, MD as Referring Physician (Ophthalmology) End, Harrell Gave, MD as Consulting Physician (Cardiology)   Assessment:   This is a routine wellness examination for Kacyn.  Hearing Screening Comments: Bilateral hearing aids Vision Screening Comments: Vision exam in Jan 2020 with Dr. Sandra Cockayne  Exercise Activities and Dietary recommendations Current Exercise Habits: Home exercise routine, Type of exercise: strength training/weights;treadmill, Time  (Minutes): > 60(90 minutes), Frequency (Times/Week): 3, Weekly Exercise (Minutes/Week): 0, Intensity: Moderate, Exercise limited by: None identified  Goals    . Increase physical activity     Starting 04/06/2018, I will continue to exercise for 90 minutes 3 days per week.        Fall Risk Fall Risk  04/06/2018 04/01/2017  Falls in the past year? 0 Yes  Comment - tripped over curb; injury to forehead and nose; medical treatment including stitches  Number falls in past yr: - 1  Injury with Fall? - Yes   Depression Screen PHQ 2/9 Scores 04/06/2018 04/01/2017  PHQ - 2 Score 0 0  PHQ- 9 Score 0 0    Cognitive Function MMSE - Mini Mental State Exam 04/06/2018 04/01/2017  Orientation to time 5 5  Orientation to Place 5 5  Registration 3 3  Attention/ Calculation 0 0  Recall 3 3  Language- name 2 objects 0 0  Language- repeat 1 1  Language- follow 3 step command 3 3  Language- read & follow direction 0 0  Write a sentence 0 0  Copy design 0 0  Total score 20 20        Immunization History  Administered Date(s) Administered  . Influenza,inj,Quad PF,6+ Mos 12/30/2017  . Influenza-Unspecified 11/02/2016  . Pneumococcal Conjugate-13 07/03/2015  . Pneumococcal Polysaccharide-23 04/01/2017  . Td 02/20/2017    Screening Tests Health Maintenance  Topic Date Due  . FOOT EXAM  09/23/2018  . HEMOGLOBIN A1C  10/05/2018  . OPHTHALMOLOGY EXAM  03/17/2019  . TETANUS/TDAP  02/21/2027  . INFLUENZA VACCINE  Completed  . PNA vac Low Risk Adult  Completed       Plan:     I have personally reviewed, addressed, and noted the following in the patient's chart:  A. Medical and social history B. Use of alcohol, tobacco or illicit drugs  C. Current medications and supplements D. Functional  ability and status E.  Nutritional status F.  Physical activity G. Advance directives H. List of other physicians I.  Hospitalizations, surgeries, and ER visits in previous 12 months J.   [place] to include hearing, vision, cognitive, depression L. Referrals and appointments - none  In addition, I have reviewed and discussed with patient certain preventive protocols, quality metrics, and best practice recommendations. A written personalized care plan for preventive services as well as general preventive health recommendations were provided to patient.  See attached scanned questionnaire for additional information.   Signed,   Lindell Noe, MHA, BS, LPN Health Coach

## 2018-04-06 NOTE — Patient Instructions (Signed)
Mr. Chaikin , Thank you for taking time to come for your Medicare Wellness Visit. I appreciate your ongoing commitment to your health goals. Please review the following plan we discussed and let me know if I can assist you in the future.   These are the goals we discussed: Goals    . Increase physical activity     Starting 04/06/2018, I will continue to exercise for 90 minutes 3 days per week.        This is a list of the screening recommended for you and due dates:  Health Maintenance  Topic Date Due  . Complete foot exam   09/23/2018  . Hemoglobin A1C  10/05/2018  . Eye exam for diabetics  03/17/2019  . Tetanus Vaccine  02/21/2027  . Flu Shot  Completed  . Pneumonia vaccines  Completed   Preventive Care for Adults  A healthy lifestyle and preventive care can promote health and wellness. Preventive health guidelines for adults include the following key practices.  . A routine yearly physical is a good way to check with your health care provider about your health and preventive screening. It is a chance to share any concerns and updates on your health and to receive a thorough exam.  . Visit your dentist for a routine exam and preventive care every 6 months. Brush your teeth twice a day and floss once a day. Good oral hygiene prevents tooth decay and gum disease.  . The frequency of eye exams is based on your age, health, family medical history, use  of contact lenses, and other factors. Follow your health care provider's recommendations for frequency of eye exams.  . Eat a healthy diet. Foods like vegetables, fruits, whole grains, low-fat dairy products, and lean protein foods contain the nutrients you need without too many calories. Decrease your intake of foods high in solid fats, added sugars, and salt. Eat the right amount of calories for you. Get information about a proper diet from your health care provider, if necessary.  . Regular physical exercise is one of the most important  things you can do for your health. Most adults should get at least 150 minutes of moderate-intensity exercise (any activity that increases your heart rate and causes you to sweat) each week. In addition, most adults need muscle-strengthening exercises on 2 or more days a week.  Silver Sneakers may be a benefit available to you. To determine eligibility, you may visit the website: www.silversneakers.com or contact program at (269)737-4322 Mon-Fri between 8AM-8PM.   . Maintain a healthy weight. The body mass index (BMI) is a screening tool to identify possible weight problems. It provides an estimate of body fat based on height and weight. Your health care provider can find your BMI and can help you achieve or maintain a healthy weight.   For adults 20 years and older: ? A BMI below 18.5 is considered underweight. ? A BMI of 18.5 to 24.9 is normal. ? A BMI of 25 to 29.9 is considered overweight. ? A BMI of 30 and above is considered obese.   . Maintain normal blood lipids and cholesterol levels by exercising and minimizing your intake of saturated fat. Eat a balanced diet with plenty of fruit and vegetables. Blood tests for lipids and cholesterol should begin at age 11 and be repeated every 5 years. If your lipid or cholesterol levels are high, you are over 50, or you are at high risk for heart disease, you may need your cholesterol levels  checked more frequently. Ongoing high lipid and cholesterol levels should be treated with medicines if diet and exercise are not working.  . If you smoke, find out from your health care provider how to quit. If you do not use tobacco, please do not start.  . If you choose to drink alcohol, please do not consume more than 2 drinks per day. One drink is considered to be 12 ounces (355 mL) of beer, 5 ounces (148 mL) of wine, or 1.5 ounces (44 mL) of liquor.  . If you are 67-8 years old, ask your health care provider if you should take aspirin to prevent  strokes.  . Use sunscreen. Apply sunscreen liberally and repeatedly throughout the day. You should seek shade when your shadow is shorter than you. Protect yourself by wearing long sleeves, pants, a wide-brimmed hat, and sunglasses year round, whenever you are outdoors.  . Once a month, do a whole body skin exam, using a mirror to look at the skin on your back. Tell your health care provider of new moles, moles that have irregular borders, moles that are larger than a pencil eraser, or moles that have changed in shape or color.

## 2018-04-06 NOTE — Progress Notes (Signed)
PCP notes:   Health maintenance:  A1C - completed  Abnormal screenings:   None  Patient concerns:   None  Nurse concerns:  None  Next PCP appt:   04/10/18 @ 1420

## 2018-04-08 NOTE — Progress Notes (Signed)
I reviewed health advisor's note, was available for consultation, and agree with documentation and plan.  

## 2018-04-10 ENCOUNTER — Ambulatory Visit (INDEPENDENT_AMBULATORY_CARE_PROVIDER_SITE_OTHER): Payer: Medicare Other | Admitting: Primary Care

## 2018-04-10 VITALS — BP 134/70 | HR 62 | Temp 98.0°F | Ht 70.0 in | Wt 163.0 lb

## 2018-04-10 DIAGNOSIS — I739 Peripheral vascular disease, unspecified: Secondary | ICD-10-CM | POA: Diagnosis not present

## 2018-04-10 DIAGNOSIS — I10 Essential (primary) hypertension: Secondary | ICD-10-CM

## 2018-04-10 DIAGNOSIS — E785 Hyperlipidemia, unspecified: Secondary | ICD-10-CM | POA: Diagnosis not present

## 2018-04-10 DIAGNOSIS — I6523 Occlusion and stenosis of bilateral carotid arteries: Secondary | ICD-10-CM

## 2018-04-10 DIAGNOSIS — E039 Hypothyroidism, unspecified: Secondary | ICD-10-CM | POA: Diagnosis not present

## 2018-04-10 DIAGNOSIS — N183 Chronic kidney disease, stage 3 unspecified: Secondary | ICD-10-CM

## 2018-04-10 DIAGNOSIS — E119 Type 2 diabetes mellitus without complications: Secondary | ICD-10-CM | POA: Diagnosis not present

## 2018-04-10 DIAGNOSIS — Z23 Encounter for immunization: Secondary | ICD-10-CM | POA: Diagnosis not present

## 2018-04-10 DIAGNOSIS — D638 Anemia in other chronic diseases classified elsewhere: Secondary | ICD-10-CM

## 2018-04-10 DIAGNOSIS — Z794 Long term (current) use of insulin: Secondary | ICD-10-CM | POA: Diagnosis not present

## 2018-04-10 DIAGNOSIS — I779 Disorder of arteries and arterioles, unspecified: Secondary | ICD-10-CM

## 2018-04-10 DIAGNOSIS — I25118 Atherosclerotic heart disease of native coronary artery with other forms of angina pectoris: Secondary | ICD-10-CM

## 2018-04-10 DIAGNOSIS — K219 Gastro-esophageal reflux disease without esophagitis: Secondary | ICD-10-CM | POA: Diagnosis not present

## 2018-04-10 MED ORDER — ZOSTER VAC RECOMB ADJUVANTED 50 MCG/0.5ML IM SUSR: 0.5000 mL | Freq: Once | INTRAMUSCULAR | 1 refills | Status: AC

## 2018-04-10 NOTE — Patient Instructions (Addendum)
Continue basaglar 10 units at bedtime and glimepiride 4 mg for diabetes. Continue to monitor your blood sugars and notify me if you see readings at or above 200 consistently.  Continue exercising. You should be getting 150 minutes of exercise weekly.  Continue to work on a healthy diet. Make sure to drink plenty of water.  Make sure to take your levothyroxine thyroid medication with water only on an empty stomach. No food or other medications for 30 minutes. No vitamins, iron, or heartburn medication for 4 hours.  Follow up with the kidney doctor, hematologist, and cardiologist as scheduled.  Please schedule a follow up appointment in 6 months for diabetes check.   It was a pleasure to see you today!

## 2018-04-10 NOTE — Assessment & Plan Note (Signed)
Recent lipid panel with LDL below 70.  Continue atorvastatin 40 mg.

## 2018-04-10 NOTE — Assessment & Plan Note (Signed)
Chronic and stable.  No longer taking clopidogrel. Continue aspirin and statin.  LDL goal below 70, meets goal today.

## 2018-04-10 NOTE — Assessment & Plan Note (Signed)
Following with nephrology, Dr. Holley Raring.  Labs will be faxed over to his office.

## 2018-04-10 NOTE — Assessment & Plan Note (Signed)
Following with nephrology and hematology, undergoing Epogen injections as needed. Continue oral iron.

## 2018-04-10 NOTE — Assessment & Plan Note (Signed)
Recent TSH within normal limits. Continue levothyroxine 175 mcg. He is taking appropriately with other medications.  Discussed to take with water only on an empty stomach, no food or other medicines for 30 minutes.  Also discussed a separate iron, PPI, vitamins 4 hours.  He verbalized understanding.

## 2018-04-10 NOTE — Assessment & Plan Note (Signed)
Asymptomatic and doing well overall. Following with cardiology every 6 months. Continue Ranexa,, statin, blood pressure control.

## 2018-04-10 NOTE — Assessment & Plan Note (Signed)
Doing well on pantoprazole, continue same. 

## 2018-04-10 NOTE — Assessment & Plan Note (Signed)
Recent A1c of 6.8 which is stable. He recently transitioned from [place] to [employer] given lack of insurance coverage.  Blood glucose readings fasting in the morning remain 80 to low 100s.  He prefers to continue his glimepiride, we will continue.  Managed on statin and ARB. Pneumonia vaccination up-to-date. Foot exam up-to-date.  Follow-up in 6 months for diabetes check.

## 2018-04-10 NOTE — Assessment & Plan Note (Signed)
Stable in the office today.  Continue current regimen. 

## 2018-04-10 NOTE — Progress Notes (Signed)
Subjective:    Patient ID: Tommy [name], male    DOB: 02/15/1927, 85 y.o.   MRN: [id number]  HPI  Tommy [name] is an 85 year old male who presents today for [place] Part 2. He saw our health advisor last week.    Immunizations: -Tetanus: Completed in 2018 -Influenza: Completed this season  -Pneumonia: Completed last in 2019 -Shingles: Would like to completed Shingrix  Diet:  He endorses a fair diet Breakfast: Omlette, oatmeal, toast  Lunch: Skips Dinner: Meat, vegetables, starch Snacks: Chips and salsa, cheese/meat Desserts: Daily  Beverages: Coffee, water, unsweet tea, occasional soda and juice  Exercise: He is exercising at the gym three times weekly Eye exam: Completed in January 2020 Dental exam: Completes semi-annually  Colonoscopy: Completed in 2009, declines given age.  BP Readings from Last 3 Encounters:  04/10/18 134/70  04/06/18 138/60  03/18/18 (!) 126/50     Review of Systems  Constitutional: Negative for unexpected weight change.  HENT: Negative for rhinorrhea.   Respiratory: Negative for cough and shortness of breath.   Cardiovascular: Negative for chest pain.  Gastrointestinal: Negative for blood in stool.       Occasional constipation and diarrhea, overall stable.   Genitourinary: Negative for difficulty urinating.  Musculoskeletal: Negative for arthralgias and myalgias.  Skin: Negative for rash.  Allergic/Immunologic: Negative for environmental allergies.  Neurological: Negative for numbness and headaches.       Some dizziness intermittently in the past, resolves quickly. No recurrent episodes.   Psychiatric/Behavioral: The patient is not nervous/anxious.        Past Medical History:  Diagnosis Date  . Anemia   . Carotid artery occlusion    LEFT  . CKD (chronic kidney disease) stage 3, GFR 30-59 ml/min (HCC)   . Coronary artery disease   . Diabetes mellitus without complication ([place])   . Dyslipidemia   . GERD (gastroesophageal reflux  disease)   . Hemorrhoids   . 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 Hypertension   . Pulmonary emboli ([place])   . Pulmonary embolism ([place])   . Thyroid disease      Social History   Socioeconomic History  . Marital status: Widowed    Spouse name: Not on file  . Number of children: 4  . Years of education: 16  . Highest education level: Bachelor's degree (e.g., BA, AB, BS)  Occupational History  . Occupation: retired  Scientific laboratory technician  . Financial resource strain: Not hard at all  . Food insecurity:    Worry: Never true    Inability: Never true  . Transportation needs:    Medical: No    Non-medical: No  Tobacco Use  . Smoking status: Former Smoker    Packs/day: 1.50    Years: 25.00    Pack years: 37.50    Types: Cigarettes    Last attempt to quit: 1978    Years since quitting: 42.1  . Smokeless tobacco: Never Used  Substance and Sexual Activity  . Alcohol use: Yes    Alcohol/week: 1.0 standard drinks    Types: 1 Glasses of wine per week    Comment: socially   . Drug use: No  . Sexual activity: Not Currently  Lifestyle  . Physical activity:    Days per week: Not on file    Minutes per session: Not on file  . Stress: Not on file  Relationships  . Social connections:    Talks on phone: Not on file  Gets together: Not on file    Attends religious service: Not on file    Active member of club or organization: Not on file    Attends meetings of clubs or organizations: Not on file    Relationship status: Not on file  . Intimate partner violence:    Fear of current or ex partner: Not on file    Emotionally abused: Not on file    Physically abused: Not on file    Forced sexual activity: Not on file  Other Topics Concern  . Not on file  Social History Narrative   Widower.   4 children, 7 grandchildren, 1 great grandchild.   Retired. Once worked as an account.    Enjoys exercising, spending time with family.     Past Surgical History:  Procedure Laterality  Date  . CAROTID ENDARTERECTOMY Left 2004  . CORONARY ARTERY BYPASS GRAFT  1994   [name]  . TONSILLECTOMY AND ADENOIDECTOMY      Family History  Problem Relation Age of Onset  . Heart attack Mother   . Heart disease Mother   . Stroke Mother   . Diabetes Father   . Coronary artery disease Sister     Allergies  Allergen Reactions  . Heparin     Current Outpatient Medications on File Prior to Visit  Medication Sig Dispense Refill  . Insulin Glargine (BASAGLAR KWIKPEN) 100 UNIT/ML SOPN Inject 0.1 mLs (10 Units total) into the skin at bedtime. For diabetes. 15 mL 5  . aspirin 81 MG tablet Take 81 mg by mouth every evening.     Marland Kitchen atorvastatin (LIPITOR) 40 MG tablet TAKE 1 TABLET BY MOUTH  DAILY 90 tablet 1  . betamethasone valerate (VALISONE) 0.1 % cream APPLY TO AFFECTED AREA TWICE A DAY 30 g 0  . Blood Glucose Monitoring Suppl (ONETOUCH VERIO) w/Device KIT 1 Device by Does not apply route daily. Use as instructed to test blood sugar daily 1 kit 0  . calcitRIOL (ROCALTROL) 0.25 MCG capsule     . CALCIUM PO Take 600 mg by mouth every morning.     . carvedilol (COREG) 6.25 MG tablet TAKE 1 TABLET BY MOUTH  TWICE A DAY WITH MEALS 180 tablet 1  . epoetin alfa (EPOGEN,PROCRIT) 66599 UNIT/ML injection 10,000 Units. Every 2 to 4 weeks    . furosemide (LASIX) 40 MG tablet TAKE 1 TABLET BY MOUTH  DAILY 90 tablet 1  . glimepiride (AMARYL) 4 MG tablet Take 1 tablet (4 mg total) by mouth daily with breakfast. 90 tablet 1  . glucose blood (ONETOUCH VERIO) test strip Use as instructed to test blood sugar once daily 100 each 5  . Hydrocortisone (PROCTOSOL HC RE) Place rectally as needed.    . Insulin Pen Needle 29G X 12.7MM MISC BD Ultrafine Pen Needle Use as instructed to inject insulin daily 300 each 1  . IRON PO Take by mouth 3 (three) times daily.     Marland Kitchen levothyroxine (SYNTHROID, LEVOTHROID) 175 MCG tablet TAKE 1 TABLET BY MOUTH  EVERY MORNING ON AN EMPTY  STOMACH WITH A FULL GLASS  OF  WATER. 90 tablet 0  . losartan (COZAAR) 50 MG tablet Take 1 tablet (50 mg total) by mouth daily. 90 tablet 3  . Multiple Vitamin (MULTIVITAMIN) capsule Take 1 capsule by mouth daily.    . nitroGLYCERIN (NITROSTAT) 0.4 MG SL tablet Place 1 tablet (0.4 mg total) under the tongue every 5 (five) minutes as needed for chest pain. 25  tablet 3  . Omega-3 Fatty Acids (FISH OIL PO) Take 1,200 mg by mouth 4 (four) times daily.    Glory Rosebush DELICA LANCETS 04H MISC USE AS INSTRUCT TO TEST BLOOD SUGAR ONCE DAILY 100 each 4  . pantoprazole (PROTONIX) 20 MG tablet TAKE 1 TABLET BY MOUTH  DAILY 90 tablet 0  . ranolazine (RANEXA) 500 MG 12 hr tablet Take 1 tablet (500 mg total) by mouth 2 (two) times daily. 180 tablet 3  . timolol (TIMOPTIC) 0.5 % ophthalmic solution APPLY 1 DROP(S) IN BOTH EYES ONCE A DAY  11   No current facility-administered medications on file prior to visit.     BP 134/70   Pulse 62   Temp 98 F (36.7 C) (Oral)   Ht _0  (1.778 m)   Wt 163 lb (73.9 kg)   SpO2 98%   BMI 23.39 kg/m    Objective:   Physical Exam  Constitutional: He is oriented to person, place, and time. He appears well-nourished.  HENT:  Mouth/Throat: No oropharyngeal exudate.  Eyes: Pupils are equal, round, and reactive to light. EOM are normal.  Neck: Neck supple. No thyromegaly present.  Cardiovascular: Normal rate and regular rhythm.  Respiratory: Effort normal and breath sounds normal.  GI: Soft. Bowel sounds are normal. There is no abdominal tenderness.  Musculoskeletal: Normal range of motion.  Neurological: He is alert and oriented to person, place, and time.  Skin: Skin is warm and dry.  Psychiatric: He has a normal mood and affect.           Assessment & Plan:

## 2018-04-14 ENCOUNTER — Other Ambulatory Visit: Payer: Self-pay

## 2018-04-14 ENCOUNTER — Inpatient Hospital Stay: Payer: Medicare Other | Attending: Oncology

## 2018-04-14 ENCOUNTER — Inpatient Hospital Stay: Payer: Medicare Other

## 2018-04-14 VITALS — BP 154/52 | HR 59

## 2018-04-14 DIAGNOSIS — D631 Anemia in chronic kidney disease: Secondary | ICD-10-CM

## 2018-04-14 DIAGNOSIS — N183 Chronic kidney disease, stage 3 unspecified: Secondary | ICD-10-CM

## 2018-04-14 LAB — HEMATOCRIT: HCT: 28.4 % — ABNORMAL LOW (ref 39.0–52.0)

## 2018-04-14 LAB — HEMOGLOBIN: Hemoglobin: 9.4 g/dL — ABNORMAL LOW (ref 13.0–17.0)

## 2018-04-14 MED ORDER — EPOETIN ALFA 20000 UNIT/ML IJ SOLN
20000.0000 [IU] | Freq: Once | INTRAMUSCULAR | Status: AC
  Administered 2018-04-14: 20000 [IU] via SUBCUTANEOUS

## 2018-04-24 DIAGNOSIS — N183 Chronic kidney disease, stage 3 (moderate): Secondary | ICD-10-CM | POA: Diagnosis not present

## 2018-04-24 DIAGNOSIS — I10 Essential (primary) hypertension: Secondary | ICD-10-CM | POA: Diagnosis not present

## 2018-04-24 DIAGNOSIS — N2581 Secondary hyperparathyroidism of renal origin: Secondary | ICD-10-CM | POA: Diagnosis not present

## 2018-04-24 DIAGNOSIS — D631 Anemia in chronic kidney disease: Secondary | ICD-10-CM | POA: Diagnosis not present

## 2018-04-27 ENCOUNTER — Other Ambulatory Visit: Payer: Self-pay | Admitting: Primary Care

## 2018-04-27 DIAGNOSIS — I2510 Atherosclerotic heart disease of native coronary artery without angina pectoris: Secondary | ICD-10-CM

## 2018-04-30 DIAGNOSIS — Z85828 Personal history of other malignant neoplasm of skin: Secondary | ICD-10-CM | POA: Diagnosis not present

## 2018-04-30 DIAGNOSIS — L720 Epidermal cyst: Secondary | ICD-10-CM | POA: Diagnosis not present

## 2018-04-30 DIAGNOSIS — L821 Other seborrheic keratosis: Secondary | ICD-10-CM | POA: Diagnosis not present

## 2018-04-30 DIAGNOSIS — L578 Other skin changes due to chronic exposure to nonionizing radiation: Secondary | ICD-10-CM | POA: Diagnosis not present

## 2018-04-30 DIAGNOSIS — D229 Melanocytic nevi, unspecified: Secondary | ICD-10-CM | POA: Diagnosis not present

## 2018-04-30 DIAGNOSIS — Z1283 Encounter for screening for malignant neoplasm of skin: Secondary | ICD-10-CM | POA: Diagnosis not present

## 2018-04-30 DIAGNOSIS — L570 Actinic keratosis: Secondary | ICD-10-CM | POA: Diagnosis not present

## 2018-04-30 DIAGNOSIS — L812 Freckles: Secondary | ICD-10-CM | POA: Diagnosis not present

## 2018-04-30 DIAGNOSIS — D1800 Hemangioma unspecified site: Secondary | ICD-10-CM | POA: Diagnosis not present

## 2018-04-30 DIAGNOSIS — D692 Other nonthrombocytopenic purpura: Secondary | ICD-10-CM | POA: Diagnosis not present

## 2018-05-12 ENCOUNTER — Inpatient Hospital Stay: Payer: Medicare Other

## 2018-05-12 ENCOUNTER — Inpatient Hospital Stay: Payer: Medicare Other | Attending: Oncology

## 2018-05-12 ENCOUNTER — Other Ambulatory Visit: Payer: Self-pay

## 2018-05-12 VITALS — BP 157/66 | HR 55

## 2018-05-12 DIAGNOSIS — N183 Chronic kidney disease, stage 3 unspecified: Secondary | ICD-10-CM

## 2018-05-12 DIAGNOSIS — Z79899 Other long term (current) drug therapy: Secondary | ICD-10-CM | POA: Diagnosis not present

## 2018-05-12 DIAGNOSIS — D631 Anemia in chronic kidney disease: Secondary | ICD-10-CM | POA: Diagnosis not present

## 2018-05-12 DIAGNOSIS — I129 Hypertensive chronic kidney disease with stage 1 through stage 4 chronic kidney disease, or unspecified chronic kidney disease: Secondary | ICD-10-CM | POA: Insufficient documentation

## 2018-05-12 LAB — HEMATOCRIT: HCT: 28.9 % — ABNORMAL LOW (ref 39.0–52.0)

## 2018-05-12 LAB — HEMOGLOBIN: Hemoglobin: 9.7 g/dL — ABNORMAL LOW (ref 13.0–17.0)

## 2018-05-12 MED ORDER — EPOETIN ALFA 20000 UNIT/ML IJ SOLN
20000.0000 [IU] | Freq: Once | INTRAMUSCULAR | Status: AC
  Administered 2018-05-12: 20000 [IU] via SUBCUTANEOUS

## 2018-06-02 ENCOUNTER — Other Ambulatory Visit: Payer: Self-pay | Admitting: Primary Care

## 2018-06-02 DIAGNOSIS — E039 Hypothyroidism, unspecified: Secondary | ICD-10-CM

## 2018-06-02 DIAGNOSIS — K219 Gastro-esophageal reflux disease without esophagitis: Secondary | ICD-10-CM

## 2018-06-04 DIAGNOSIS — N183 Chronic kidney disease, stage 3 (moderate): Secondary | ICD-10-CM | POA: Diagnosis not present

## 2018-06-08 ENCOUNTER — Other Ambulatory Visit: Payer: Self-pay | Admitting: Primary Care

## 2018-06-08 ENCOUNTER — Other Ambulatory Visit: Payer: Self-pay

## 2018-06-08 DIAGNOSIS — E118 Type 2 diabetes mellitus with unspecified complications: Secondary | ICD-10-CM

## 2018-06-08 DIAGNOSIS — Z794 Long term (current) use of insulin: Secondary | ICD-10-CM

## 2018-06-09 ENCOUNTER — Inpatient Hospital Stay: Payer: Medicare Other | Attending: Oncology

## 2018-06-09 ENCOUNTER — Other Ambulatory Visit: Payer: Self-pay

## 2018-06-09 ENCOUNTER — Inpatient Hospital Stay: Payer: Medicare Other | Admitting: Oncology

## 2018-06-09 ENCOUNTER — Inpatient Hospital Stay: Payer: Medicare Other

## 2018-06-09 VITALS — BP 156/70 | HR 54

## 2018-06-09 DIAGNOSIS — N183 Chronic kidney disease, stage 3 unspecified: Secondary | ICD-10-CM

## 2018-06-09 DIAGNOSIS — I129 Hypertensive chronic kidney disease with stage 1 through stage 4 chronic kidney disease, or unspecified chronic kidney disease: Secondary | ICD-10-CM | POA: Insufficient documentation

## 2018-06-09 DIAGNOSIS — Z79899 Other long term (current) drug therapy: Secondary | ICD-10-CM | POA: Insufficient documentation

## 2018-06-09 DIAGNOSIS — D631 Anemia in chronic kidney disease: Secondary | ICD-10-CM | POA: Insufficient documentation

## 2018-06-09 LAB — RETIC PANEL
Immature Retic Fract: 13.5 % (ref 2.3–15.9)
RBC.: 2.75 MIL/uL — ABNORMAL LOW (ref 4.22–5.81)
Retic Count, Absolute: 51.7 10*3/uL (ref 19.0–186.0)
Retic Ct Pct: 1.9 % (ref 0.4–3.1)
Reticulocyte Hemoglobin: 36.2 pg (ref >27.9)

## 2018-06-09 LAB — CBC WITH DIFFERENTIAL/PLATELET
Abs Immature Granulocytes: 0.02 10*3/uL (ref 0.00–0.07)
Basophils Absolute: 0 10*3/uL (ref 0.0–0.1)
Basophils Relative: 1 %
Eosinophils Absolute: 0.1 10*3/uL (ref 0.0–0.5)
Eosinophils Relative: 3 %
HCT: 28.3 % — ABNORMAL LOW (ref 39.0–52.0)
Hemoglobin: 9.2 g/dL — ABNORMAL LOW (ref 13.0–17.0)
Immature Granulocytes: 0 %
Lymphocytes Relative: 24 %
Lymphs Abs: 1.1 10*3/uL (ref 0.7–4.0)
MCH: 33.5 pg (ref 26.0–34.0)
MCHC: 32.5 g/dL (ref 30.0–36.0)
MCV: 102.9 fL — ABNORMAL HIGH (ref 80.0–100.0)
Monocytes Absolute: 0.5 10*3/uL (ref 0.1–1.0)
Monocytes Relative: 11 %
Neutro Abs: 2.9 10*3/uL (ref 1.7–7.7)
Neutrophils Relative %: 61 %
Platelets: 140 10*3/uL — ABNORMAL LOW (ref 150–400)
RBC: 2.75 MIL/uL — ABNORMAL LOW (ref 4.22–5.81)
RDW: 12.9 % (ref 11.5–15.5)
WBC: 4.7 10*3/uL (ref 4.0–10.5)
nRBC: 0 % (ref 0.0–0.2)

## 2018-06-09 MED ORDER — EPOETIN ALFA 20000 UNIT/ML IJ SOLN
20000.0000 [IU] | Freq: Once | INTRAMUSCULAR | Status: AC
  Administered 2018-06-09: 20000 [IU] via SUBCUTANEOUS

## 2018-06-10 ENCOUNTER — Telehealth: Payer: Self-pay | Admitting: *Deleted

## 2018-06-10 ENCOUNTER — Inpatient Hospital Stay: Payer: Medicare Other | Admitting: Oncology

## 2018-06-10 NOTE — Telephone Encounter (Signed)
Patient returned missed call

## 2018-06-11 ENCOUNTER — Other Ambulatory Visit: Payer: Self-pay

## 2018-06-11 DIAGNOSIS — N183 Chronic kidney disease, stage 3 unspecified: Secondary | ICD-10-CM

## 2018-06-11 DIAGNOSIS — D631 Anemia in chronic kidney disease: Secondary | ICD-10-CM

## 2018-06-11 NOTE — Telephone Encounter (Signed)
I have left multiple messages trying to reach patient.  Left vm notifying pt that he does not need procrit at this time & I will have schedulers contact him with next appt.

## 2018-06-13 ENCOUNTER — Other Ambulatory Visit: Payer: Self-pay | Admitting: Primary Care

## 2018-06-13 DIAGNOSIS — I8393 Asymptomatic varicose veins of bilateral lower extremities: Secondary | ICD-10-CM

## 2018-06-15 NOTE — Telephone Encounter (Signed)
Refill sent to pharmacy.   

## 2018-06-15 NOTE — Telephone Encounter (Signed)
Last filled 03/10/18... please advise if okay for pt to continue

## 2018-06-18 ENCOUNTER — Other Ambulatory Visit: Payer: Self-pay

## 2018-06-18 MED ORDER — NITROGLYCERIN 0.4 MG SL SUBL: 0.4000 mg | SUBLINGUAL_TABLET | SUBLINGUAL | 3 refills | Status: DC | PRN

## 2018-06-19 ENCOUNTER — Other Ambulatory Visit: Payer: Self-pay | Admitting: Primary Care

## 2018-06-19 DIAGNOSIS — I2510 Atherosclerotic heart disease of native coronary artery without angina pectoris: Secondary | ICD-10-CM

## 2018-07-03 ENCOUNTER — Other Ambulatory Visit: Payer: Self-pay | Admitting: Oncology

## 2018-07-08 ENCOUNTER — Inpatient Hospital Stay: Payer: Medicare Other | Admitting: Oncology

## 2018-07-08 ENCOUNTER — Other Ambulatory Visit: Payer: Self-pay

## 2018-07-08 ENCOUNTER — Inpatient Hospital Stay: Payer: Medicare Other | Attending: Oncology

## 2018-07-08 DIAGNOSIS — D631 Anemia in chronic kidney disease: Secondary | ICD-10-CM

## 2018-07-08 DIAGNOSIS — N183 Chronic kidney disease, stage 3 (moderate): Secondary | ICD-10-CM | POA: Insufficient documentation

## 2018-07-08 LAB — RETIC PANEL
Immature Retic Fract: 6.3 % (ref 2.3–15.9)
RBC.: 3.34 MIL/uL — ABNORMAL LOW (ref 4.22–5.81)
Retic Count, Absolute: 30.4 10*3/uL (ref 19.0–186.0)
Retic Ct Pct: 0.9 % (ref 0.4–3.1)
Reticulocyte Hemoglobin: 37.4 pg (ref >27.9)

## 2018-07-08 LAB — CBC WITH DIFFERENTIAL/PLATELET
Abs Immature Granulocytes: 0.02 10*3/uL (ref 0.00–0.07)
Basophils Absolute: 0 10*3/uL (ref 0.0–0.1)
Basophils Relative: 1 %
Eosinophils Absolute: 0.2 10*3/uL (ref 0.0–0.5)
Eosinophils Relative: 3 %
HCT: 33.6 % — ABNORMAL LOW (ref 39.0–52.0)
Hemoglobin: 11.1 g/dL — ABNORMAL LOW (ref 13.0–17.0)
Immature Granulocytes: 0 %
Lymphocytes Relative: 22 %
Lymphs Abs: 1.1 10*3/uL (ref 0.7–4.0)
MCH: 33.2 pg (ref 26.0–34.0)
MCHC: 33 g/dL (ref 30.0–36.0)
MCV: 100.6 fL — ABNORMAL HIGH (ref 80.0–100.0)
Monocytes Absolute: 0.6 10*3/uL (ref 0.1–1.0)
Monocytes Relative: 11 %
Neutro Abs: 3.2 10*3/uL (ref 1.7–7.7)
Neutrophils Relative %: 63 %
Platelets: 127 10*3/uL — ABNORMAL LOW (ref 150–400)
RBC: 3.34 MIL/uL — ABNORMAL LOW (ref 4.22–5.81)
RDW: 12.7 % (ref 11.5–15.5)
WBC: 5.1 10*3/uL (ref 4.0–10.5)
nRBC: 0 % (ref 0.0–0.2)

## 2018-07-09 DIAGNOSIS — N2881 Hypertrophy of kidney: Secondary | ICD-10-CM | POA: Diagnosis not present

## 2018-07-09 DIAGNOSIS — N183 Chronic kidney disease, stage 3 (moderate): Secondary | ICD-10-CM | POA: Diagnosis not present

## 2018-07-09 DIAGNOSIS — D631 Anemia in chronic kidney disease: Secondary | ICD-10-CM | POA: Diagnosis not present

## 2018-07-09 DIAGNOSIS — N2581 Secondary hyperparathyroidism of renal origin: Secondary | ICD-10-CM | POA: Diagnosis not present

## 2018-07-09 DIAGNOSIS — I10 Essential (primary) hypertension: Secondary | ICD-10-CM | POA: Diagnosis not present

## 2018-07-13 ENCOUNTER — Other Ambulatory Visit: Payer: Self-pay | Admitting: Internal Medicine

## 2018-08-03 ENCOUNTER — Other Ambulatory Visit: Payer: Self-pay

## 2018-08-03 ENCOUNTER — Inpatient Hospital Stay: Payer: Medicare Other

## 2018-08-03 ENCOUNTER — Inpatient Hospital Stay: Payer: Medicare Other | Attending: Oncology

## 2018-08-03 DIAGNOSIS — Z86711 Personal history of pulmonary embolism: Secondary | ICD-10-CM | POA: Diagnosis not present

## 2018-08-03 DIAGNOSIS — K219 Gastro-esophageal reflux disease without esophagitis: Secondary | ICD-10-CM | POA: Insufficient documentation

## 2018-08-03 DIAGNOSIS — I2510 Atherosclerotic heart disease of native coronary artery without angina pectoris: Secondary | ICD-10-CM | POA: Diagnosis not present

## 2018-08-03 DIAGNOSIS — E119 Type 2 diabetes mellitus without complications: Secondary | ICD-10-CM | POA: Diagnosis not present

## 2018-08-03 DIAGNOSIS — I129 Hypertensive chronic kidney disease with stage 1 through stage 4 chronic kidney disease, or unspecified chronic kidney disease: Secondary | ICD-10-CM | POA: Insufficient documentation

## 2018-08-03 DIAGNOSIS — Z7982 Long term (current) use of aspirin: Secondary | ICD-10-CM | POA: Diagnosis not present

## 2018-08-03 DIAGNOSIS — Z794 Long term (current) use of insulin: Secondary | ICD-10-CM | POA: Insufficient documentation

## 2018-08-03 DIAGNOSIS — N183 Chronic kidney disease, stage 3 unspecified: Secondary | ICD-10-CM

## 2018-08-03 DIAGNOSIS — Z87891 Personal history of nicotine dependence: Secondary | ICD-10-CM | POA: Diagnosis not present

## 2018-08-03 DIAGNOSIS — D631 Anemia in chronic kidney disease: Secondary | ICD-10-CM | POA: Insufficient documentation

## 2018-08-03 DIAGNOSIS — E785 Hyperlipidemia, unspecified: Secondary | ICD-10-CM | POA: Insufficient documentation

## 2018-08-03 DIAGNOSIS — Z79899 Other long term (current) drug therapy: Secondary | ICD-10-CM | POA: Diagnosis not present

## 2018-08-03 LAB — HEMOGLOBIN AND HEMATOCRIT, BLOOD
HCT: 30.2 % — ABNORMAL LOW (ref 39.0–52.0)
Hemoglobin: 10 g/dL — ABNORMAL LOW (ref 13.0–17.0)

## 2018-08-04 ENCOUNTER — Encounter: Payer: Self-pay | Admitting: Oncology

## 2018-08-04 ENCOUNTER — Inpatient Hospital Stay: Payer: Medicare Other | Admitting: Oncology

## 2018-08-10 ENCOUNTER — Encounter: Payer: Self-pay | Admitting: Oncology

## 2018-08-10 ENCOUNTER — Other Ambulatory Visit: Payer: Self-pay

## 2018-08-10 ENCOUNTER — Inpatient Hospital Stay (HOSPITAL_BASED_OUTPATIENT_CLINIC_OR_DEPARTMENT_OTHER): Payer: Medicare Other | Admitting: Oncology

## 2018-08-10 VITALS — BP 140/55 | HR 54 | Temp 97.2°F | Resp 18 | Wt 159.9 lb

## 2018-08-10 DIAGNOSIS — D631 Anemia in chronic kidney disease: Secondary | ICD-10-CM | POA: Diagnosis not present

## 2018-08-10 DIAGNOSIS — E119 Type 2 diabetes mellitus without complications: Secondary | ICD-10-CM | POA: Diagnosis not present

## 2018-08-10 DIAGNOSIS — Z87891 Personal history of nicotine dependence: Secondary | ICD-10-CM

## 2018-08-10 DIAGNOSIS — K219 Gastro-esophageal reflux disease without esophagitis: Secondary | ICD-10-CM | POA: Diagnosis not present

## 2018-08-10 DIAGNOSIS — Z794 Long term (current) use of insulin: Secondary | ICD-10-CM | POA: Diagnosis not present

## 2018-08-10 DIAGNOSIS — E785 Hyperlipidemia, unspecified: Secondary | ICD-10-CM

## 2018-08-10 DIAGNOSIS — Z79899 Other long term (current) drug therapy: Secondary | ICD-10-CM | POA: Diagnosis not present

## 2018-08-10 DIAGNOSIS — I129 Hypertensive chronic kidney disease with stage 1 through stage 4 chronic kidney disease, or unspecified chronic kidney disease: Secondary | ICD-10-CM | POA: Diagnosis not present

## 2018-08-10 DIAGNOSIS — Z7982 Long term (current) use of aspirin: Secondary | ICD-10-CM | POA: Diagnosis not present

## 2018-08-10 DIAGNOSIS — N183 Chronic kidney disease, stage 3 (moderate): Secondary | ICD-10-CM

## 2018-08-10 DIAGNOSIS — I2510 Atherosclerotic heart disease of native coronary artery without angina pectoris: Secondary | ICD-10-CM | POA: Diagnosis not present

## 2018-08-10 DIAGNOSIS — Z86711 Personal history of pulmonary embolism: Secondary | ICD-10-CM

## 2018-08-10 DIAGNOSIS — D539 Nutritional anemia, unspecified: Secondary | ICD-10-CM

## 2018-08-10 NOTE — Progress Notes (Signed)
Hematology/Oncology follow up note [facility] [phone] [phone]   Patient Care Team: Pleas Koch, NP as PCP - General (Internal Medicine) Dingeldein, Remo Lipps, MD as Referring Physician (Ophthalmology) End, Harrell Gave, MD as Consulting Physician (Cardiology)  REFERRING PROVIDER: Dr.Lateef REASON FOR VISIT Follow up for treatment of anemia of CKD, on procrit shots  HISTORY OF PRESENTING ILLNESS:  Tommy [name] is a  85 y.o.  male with PMH listed below who was referred to me for evaluation of anemia.  Patient recently removed from [place] to [place] to live with his son.  He has chronic kidney disease and anemia associated with CKD.  He reports that he has been getting Procrit 10,000 units every 2 weeks for many years.  Denies any recent thrombosis events.  Patient reports that at the last Procrit shot he received was back in September 2018 before he moved here. He establish care with Dr. Holley Raring for his CKD and he was referred to me for evaluation of anemia and possible Procrit shots.  Patient had lab work done at [facility] on 05/15/2017 hemoglobin 9.6, MCV 98, WBC 4.5, platelet count 1 70,000, normal differential use.   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 He has IVC filter in place.   INTERVAL HISTORY Tommy [name] is a 85 y.o. male who has above history reviewed by me today presents for follow-up for anemia due to CKD. Patient has been on erythropoietin therapy monthly if hemoglobin is less than 10. He has not needed for the past 2 months.  Hemoglobin was at ten 1 week ago did not receive erythropoietin therapy.  Patient reports feeling no difference when his hemoglobin is better or worse than 10. His chronic fatigue has not changed much. Does not have any new complaints. Denies any shortness of breath, chest pain, lower extremity  swelling and abdominal pain.  Review of Systems  Constitutional: Positive for malaise/fatigue. Negative for chills, fever and weight loss.  HENT: Negative for sore throat.   Eyes: Negative for redness.  Respiratory: Negative for cough, shortness of breath and wheezing.   Cardiovascular: Negative for chest pain, palpitations and leg swelling.  Gastrointestinal: Negative for abdominal pain, blood in stool, nausea and vomiting.  Genitourinary: Negative for dysuria.  Musculoskeletal: Negative for myalgias.  Skin: Negative for rash.  Neurological: Negative for dizziness, tingling and tremors.  Endo/Heme/Allergies: Does not bruise/bleed easily.  Psychiatric/Behavioral: Negative for hallucinations.    MEDICAL HISTORY:  Past Medical History:  Diagnosis Date   Anemia    Carotid artery occlusion    LEFT   CKD (chronic kidney disease) stage 3, GFR 30-59 ml/min (HCC)    Coronary artery disease    Diabetes mellitus without complication (HCC)    Dyslipidemia    GERD (gastroesophageal reflux disease)    Hemorrhoids    Heparin induced thrombocytopenia (HCC)    Hypertension    Pulmonary emboli (HCC)    Pulmonary embolism (HCC)    Thyroid disease     SURGICAL HISTORY: Past Surgical History:  Procedure Laterality Date   CAROTID ENDARTERECTOMY Left 2004   CORONARY ARTERY BYPASS GRAFT  1994   [name]   TONSILLECTOMY AND ADENOIDECTOMY      SOCIAL HISTORY: Social History   Socioeconomic History   Marital status: Widowed    Spouse name: Not on file   Number of children: 4   Years of education: 52  Highest education level: Bachelor's degree (e.g., BA, AB, BS)  Occupational History   Occupation: retired  Scientist, product/process development strain: Not hard at [employer] insecurity:    Worry: Never true    Inability: Never true   Transportation needs:    Medical: No    Non-medical: No  Tobacco Use   Smoking status: Former Smoker    Packs/day:  1.50    Years: 25.00    Pack years: 37.50    Types: Cigarettes    Last attempt to quit: 1978    Years since quitting: 42.4   Smokeless tobacco: Never Used  Substance and Sexual Activity   Alcohol use: Yes    Alcohol/week: 1.0 standard drinks    Types: 1 Glasses of wine per week    Comment: socially    Drug use: No   Sexual activity: Not Currently  Lifestyle   Physical activity:    Days per week: Not on file    Minutes per session: Not on file   Stress: Not on file  Relationships   Social connections:    Talks on phone: Not on file    Gets together: Not on file    Attends religious service: Not on file    Active member of club or organization: Not on file    Attends meetings of clubs or organizations: Not on file    Relationship status: Not on file   Intimate partner violence:    Fear of current or ex partner: Not on file    Emotionally abused: Not on file    Physically abused: Not on file    Forced sexual activity: Not on file  Other Topics Concern   Not on file  Social History Narrative   Widower.   4 children, 7 grandchildren, 1 great grandchild.   Retired. Once worked as an account.    Enjoys exercising, spending time with family.     FAMILY HISTORY: Family History  Problem Relation Age of Onset   Heart attack Mother    Heart disease Mother    Stroke Mother    Diabetes Father    Coronary artery disease Sister     ALLERGIES:  is allergic to heparin.  MEDICATIONS:  Current Outpatient Medications  Medication Sig Dispense Refill   aspirin 81 MG tablet Take 81 mg by mouth every evening.      atorvastatin (LIPITOR) 40 MG tablet TAKE 1 TABLET BY MOUTH  DAILY 90 tablet 1   betamethasone valerate (VALISONE) 0.1 % cream APPLY TO AFFECTED AREA TWICE A DAY 30 g 0   Blood Glucose Monitoring Suppl (ONETOUCH VERIO) w/Device KIT 1 Device by Does not apply route daily. Use as instructed to test blood sugar daily 1 kit 0   CALCIUM PO Take 600 mg by  mouth every morning.      carvedilol (COREG) 6.25 MG tablet TAKE 1 TABLET BY MOUTH  TWICE A DAY WITH MEALS 180 tablet 1   cholecalciferol (VITAMIN D3) 25 MCG (1000 UT) tablet Take 1,000 Units by mouth daily.     epoetin alfa (EPOGEN,PROCRIT) 04540 UNIT/ML injection 10,000 Units. Every 2 to 4 weeks     furosemide (LASIX) 40 MG tablet TAKE 1 TABLET BY MOUTH  DAILY 120 tablet 1   glimepiride (AMARYL) 4 MG tablet Take 1 tablet (4 mg total) by mouth daily with breakfast. 90 tablet 1   Hydrocortisone (PROCTOSOL HC RE) Place rectally as needed.     Insulin Glargine (  BASAGLAR KWIKPEN) 100 UNIT/ML SOPN Inject 0.1 mLs (10 Units total) into the skin at bedtime. For diabetes. 15 mL 5   Insulin Pen Needle 29G X 12.7MM MISC BD Ultrafine Pen Needle Use as instructed to inject insulin daily 300 each 1   IRON PO Take by mouth 3 (three) times daily.      levothyroxine (SYNTHROID, LEVOTHROID) 175 MCG tablet TAKE 1 TABLET BY MOUTH  EVERY MORNING ON AN EMPTY  STOMACH WITH A FULL GLASS  OF WATER. 90 tablet 1   Multiple Vitamin (MULTIVITAMIN) capsule Take 1 capsule by mouth daily.     nitroGLYCERIN (NITROSTAT) 0.4 MG SL tablet Place 1 tablet (0.4 mg total) under the tongue every 5 (five) minutes as needed for chest pain. 25 tablet 3   Omega-3 Fatty Acids (FISH OIL PO) Take 1,200 mg by mouth 4 (four) times daily.     ONETOUCH DELICA LANCETS 51V MISC USE AS INSTRUCT TO TEST BLOOD SUGAR ONCE DAILY 100 each 4   ONETOUCH VERIO test strip USE AS INSTRUCTED TO TEST BLOOD SUGAR ONCE DAILY 100 each 5   pantoprazole (PROTONIX) 20 MG tablet TAKE 1 TABLET BY MOUTH  DAILY 90 tablet 1   ranolazine (RANEXA) 500 MG 12 hr tablet TAKE 1 TABLET BY MOUTH TWO  TIMES DAILY 180 tablet 0   timolol (TIMOPTIC) 0.5 % ophthalmic solution APPLY 1 DROP(S) IN BOTH EYES ONCE A DAY  11   calcitRIOL (ROCALTROL) 0.25 MCG capsule      losartan (COZAAR) 50 MG tablet Take 1 tablet (50 mg total) by mouth daily. 90 tablet 3   No current  facility-administered medications for this visit.      PHYSICAL EXAMINATION: ECOG PERFORMANCE STATUS: 1 - Symptomatic but completely ambulatory Vitals:   08/10/18 1303  BP: (!) 140/55  Pulse: (!) 54  Resp: 18  Temp: (!) 97.2 F (36.2 C)   Filed Weights   08/10/18 1303  Weight: 159 lb 14.4 oz (72.5 kg)    Physical Exam  Constitutional: He is oriented to person, place, and time. No distress.  Thin, walk in.   HENT:  Head: Normocephalic and atraumatic.  Nose: Nose normal.  Mouth/Throat: Oropharynx is clear and moist. No oropharyngeal exudate.  Eyes: Pupils are equal, round, and reactive to light. EOM are normal. Right eye exhibits no discharge. Left eye exhibits no discharge. No scleral icterus.  Neck: Normal range of motion. Neck supple. No JVD present.  Cardiovascular: Normal rate, regular rhythm and normal heart sounds.  No murmur heard. Pulmonary/Chest: Effort normal. No respiratory distress.  Abdominal: Soft. Bowel sounds are normal. He exhibits no distension and no mass. There is no abdominal tenderness. There is no rebound.  Musculoskeletal: Normal range of motion.        General: No tenderness or edema.  Lymphadenopathy:    He has no cervical adenopathy.  Neurological: He is alert and oriented to person, place, and time. No cranial nerve deficit. He exhibits normal muscle tone. Coordination normal.  Skin: Skin is warm and dry. No rash noted. He is not diaphoretic. No erythema.  Psychiatric: Affect and judgment normal.     LABORATORY DATA:  I have reviewed the data as listed Lab Results  Component Value Date   WBC 5.1 07/08/2018   HGB 10.0 (L) 08/03/2018   HCT 30.2 (L) 08/03/2018   MCV 100.6 (H) 07/08/2018   PLT 127 (L) 07/08/2018   Recent Labs    04/06/18 1237  NA 137  K 4.8  CL 103  CO2 28  GLUCOSE 263*  BUN 35*  CREATININE 1.53*  CALCIUM 9.5  PROT 7.1  ALBUMIN 4.0  AST 19  ALT 18  ALKPHOS 71  BILITOT 0.4     Iron/TIBC/Ferritin/ %Sat      Component Value Date/Time   IRON 109 05/23/2017 1153   TIBC 311 05/23/2017 1153   FERRITIN 670 (H) 05/23/2017 1153   IRONPCTSAT 35 05/23/2017 1153   SPEP negative for M spike.   RADIOGRAPHIC STUDIES: I have personally reviewed the radiological images as listed and agreed with the findings in the report. 01/19/2018 CXR FINDINGS: The lungs are clear and negative for focal airspace consolidation, pulmonary edema or suspicious pulmonary nodule. No pleural effusion or pneumothorax. Cardiac and mediastinal contours are within normal limits. Patient is status post median sternotomy with evidence of prior CABG including LIMA bypass. Trace linear scarring versus atelectasis in the lingula. No acute fracture or lytic or blastic osseous lesions. The visualized upper abdominal bowel gas pattern is unremarkable.  IMPRESSION: No active cardiopulmonary disease. ASSESSMENT & PLAN:  1. Anemia of chronic renal failure, stage 3 (moderate) (HCC)   2. Macrocytic anemia    Labs are reviewed and discussed with patient.  Hemoglobin is at 10 last week.  He did not receive erythropoietin treatment at that time. Continue H&H every 4 weeks and proceed with Procrit to 20,000 if hemoglobin is less than 10.  Macrocytic anemia, MCV borderline elevated.  Stable MCV.  Continue to monitor. #Thrombocytopenia, platelet counts at 1 27,000.  Previous work-up showed normal B12, folate, TSH, Onset of thrombocytopenia in June 2019. ? MDS,  .  If rapid declining, will discuss about bone marrow biopsy.  All questions were answered. The patient knows to call the clinic with any problems questions or concerns. Orders Placed This Encounter  Procedures   Retic Panel    Standing Status:   Future    Standing Expiration Date:   08/10/2019    Return of visit:  Monthly H&H +/- procrit. Follow up in clinic in 3 months.     Earlie Server, MD, PhD  08/10/2018

## 2018-08-10 NOTE — Progress Notes (Signed)
Patient here for follow up. No concerns voiced. Pt states he did not get procrit on 6/1.

## 2018-08-13 ENCOUNTER — Telehealth: Payer: Self-pay | Admitting: Primary Care

## 2018-08-13 DIAGNOSIS — H9190 Unspecified hearing loss, unspecified ear: Secondary | ICD-10-CM

## 2018-08-13 NOTE — Telephone Encounter (Signed)
Tommy [name] [phone] Pt called stating he has an appointment [facility] 08/19/2018 they told him he needed a referral for a hearing test

## 2018-08-14 NOTE — Telephone Encounter (Signed)
Referral faxed for ENT and hearing test to [facility]

## 2018-08-14 NOTE — Telephone Encounter (Signed)
Noted, referral placed.  FYI to referral coordinator.

## 2018-08-16 ENCOUNTER — Other Ambulatory Visit: Payer: Self-pay | Admitting: Primary Care

## 2018-08-16 DIAGNOSIS — I2510 Atherosclerotic heart disease of native coronary artery without angina pectoris: Secondary | ICD-10-CM

## 2018-08-18 DIAGNOSIS — H903 Sensorineural hearing loss, bilateral: Secondary | ICD-10-CM | POA: Diagnosis not present

## 2018-08-31 ENCOUNTER — Inpatient Hospital Stay: Payer: Medicare Other

## 2018-08-31 ENCOUNTER — Other Ambulatory Visit: Payer: Self-pay

## 2018-08-31 VITALS — BP 150/65 | HR 59

## 2018-08-31 DIAGNOSIS — D631 Anemia in chronic kidney disease: Secondary | ICD-10-CM

## 2018-08-31 DIAGNOSIS — N183 Chronic kidney disease, stage 3 unspecified: Secondary | ICD-10-CM

## 2018-08-31 DIAGNOSIS — I129 Hypertensive chronic kidney disease with stage 1 through stage 4 chronic kidney disease, or unspecified chronic kidney disease: Secondary | ICD-10-CM | POA: Diagnosis not present

## 2018-08-31 DIAGNOSIS — I2510 Atherosclerotic heart disease of native coronary artery without angina pectoris: Secondary | ICD-10-CM | POA: Diagnosis not present

## 2018-08-31 DIAGNOSIS — E119 Type 2 diabetes mellitus without complications: Secondary | ICD-10-CM | POA: Diagnosis not present

## 2018-08-31 DIAGNOSIS — E785 Hyperlipidemia, unspecified: Secondary | ICD-10-CM | POA: Diagnosis not present

## 2018-08-31 LAB — HEMOGLOBIN AND HEMATOCRIT, BLOOD
HCT: 27 % — ABNORMAL LOW (ref 39.0–52.0)
Hemoglobin: 9.1 g/dL — ABNORMAL LOW (ref 13.0–17.0)

## 2018-08-31 MED ORDER — EPOETIN ALFA 20000 UNIT/ML IJ SOLN
20000.0000 [IU] | Freq: Once | INTRAMUSCULAR | Status: AC
  Administered 2018-08-31: 20000 [IU] via SUBCUTANEOUS

## 2018-09-14 DIAGNOSIS — H40053 Ocular hypertension, bilateral: Secondary | ICD-10-CM | POA: Diagnosis not present

## 2018-09-18 DIAGNOSIS — Z794 Long term (current) use of insulin: Secondary | ICD-10-CM

## 2018-09-18 DIAGNOSIS — E119 Type 2 diabetes mellitus without complications: Secondary | ICD-10-CM

## 2018-09-18 MED ORDER — INSULIN GLARGINE 100 UNIT/ML SOLOSTAR PEN: 10.0000 [IU] | PEN_INJECTOR | Freq: Every day | SUBCUTANEOUS | 5 refills | Status: DC

## 2018-09-22 ENCOUNTER — Telehealth: Payer: Self-pay | Admitting: Internal Medicine

## 2018-09-22 NOTE — Telephone Encounter (Signed)

## 2018-09-22 NOTE — Progress Notes (Signed)
Follow-up Outpatient Visit Date: 09/23/2018  Primary Care Provider: Pleas Koch, NP [place] [address]  Chief Complaint: Chest pain  HPI:  Tommy [name] is a 85 y.o. year-old male with history of coronary artery disease status post remote CABG with stable angina, PAD, carotid artery stenosis status post left CEA, hypertension, hyperlipidemia, type 2 diabetes mellitus, CKD, and chronic anemia, who presents for follow-up of coronary artery disease.  I last saw him in 03/2018, at which time Tommy [name] reported a few episodes of chest pain with activity, which prompted him to take nitroglycerin for the first time in a while.  He also noticed more exertional dyspnea while exercising at the [facility].  Potential causes were felt to be progression of CAD versus underlying anemia and supply-demand mismatch.  We discussed proceeding with stress testing versus cardiac catheterization, but in light of Tommy [name] his age and comorbidities (chronic kidney disease and anemia), he wished to defer testing.  We continued his previous doses of carvedilol and ranolazine, as soft blood pressure and renal insufficiency precluded escalation.  Since our last visit, Tommy [name] was placed on Lokelma by Dr. Holley Raring.  Tommy [name] noticed increased leg swelling and chest pain.  The symptoms improved with discontinuation of Lokelma.  Today, Tommy [name] reports that he is feeling well.  He goes through periods where he has nightly chest pain that awakens him from sleep.  This resolves promptly with a single sublingual nitroglycerin.  He has not needed to use any nitroglycerin for the last 5-6 days.  He is no longer able to go to the [facility] on account of COVID-19.  However, he has been using a treadmill several days a week without any chest pain.  However, he feels like his exertional dyspnea might be slightly worse compared to a year ago.  He reports occasional lightheadedness that is not orthostatic in nature.  He denies  palpitations, orthopnea, PND, and edema.  He is compliant with his medications.  Home blood pressure readings are typically in the 100-130/40-60 mmHg range.  --------------------------------------------------------------------------------------------------  Cardiovascular History & Procedures: Cardiovascular Problems:  Coronary artery disease status post CABG (1994)  Carotid artery stenosis status post left carotid endarterectomy (2004)  Abdominal aortic aneurysm  Risk Factors:  Known coronary artery disease, hypertension, hyperlipidemia, diabetes mellitus, male gender, and age  Cath/PCI:  LHC (02/12/16): Severe native disease with chronic total occlusions of the ostial LAD and LCx as well as 99% stenosis of distal LMCA. 80% ostial RCA stenosis as well as diffuse mid RCA with tandem lesions of 85-99%. Patent LIMA to LAD and SVG to distal RCA. Additional SVG to unknown vessel is chronically occluded.  CV Surgery:  CABG (1994, [place], [place]): LIMA to LAD, SVG to distal RCA, and SVG to other unknown vessel.  Left carotid endarterectomy (2004)  EP Procedures and Devices:  None  Non-Invasive Evaluation(s):  TTE (02/06/17): Normal LV size. LVEF 55-60% with normal wall motion. Normal diastolic function. Aortic sclerosis without stenosis. Mild MR. Mild left atrial enlargement. Moderate TR. Upper normal to mildly elevated pulmonary artery pressure.  Carotid Doppler (02/06/17): Mild narrowing of bilateral internal carotid arteries (less than 40%. Less than 50% stenosis involving the right common carotid artery. Antegrade vertebral artery flow bilaterally. Normal flow in the subclavian arteries bilaterally.  Carotid artery Doppler (09/14/15): Moderate plaque in the right ICA with less than 50% stenosis.  Pharmacologic MPI (12/16/13): Mild inferior ischemia with LVEF of 58%.  TTE (12/02/13): Mild LVH  with LVEF of 65% and grade 1 diastolic dysfunction. Aortic sclerosis,  mild TR, and moderate pulmonary hypertension. Question PFO.  Aortoiliac ultrasound (08/09/13): No evidence of AAA. Moderately elevated right common iliac artery velocity with 50-75% stenosis.  Recent CV Pertinent Labs: Lab Results  Component Value Date   CHOL 125 04/06/2018   HDL 35.60 (L) 04/06/2018   LDLCALC 61 04/06/2018   TRIG 143.0 04/06/2018   CHOLHDL 4 04/06/2018   K 4.8 04/06/2018   BUN 35 (H) 04/06/2018   CREATININE 1.53 (H) 04/06/2018    Past medical and surgical history were reviewed and updated in EPIC.  Current Meds  Medication Sig  . aspirin 81 MG tablet Take 81 mg by mouth every evening.   Marland Kitchen atorvastatin (LIPITOR) 40 MG tablet TAKE 1 TABLET BY MOUTH  DAILY  . betamethasone valerate (VALISONE) 0.1 % cream APPLY TO AFFECTED AREA TWICE A DAY  . Blood Glucose Monitoring Suppl (ONETOUCH VERIO) w/Device KIT 1 Device by Does not apply route daily. Use as instructed to test blood sugar daily  . CALCIUM PO Take 600 mg by mouth every morning.   . carvedilol (COREG) 6.25 MG tablet TAKE 1 TABLET BY MOUTH  TWICE A DAY WITH MEALS  . cholecalciferol (VITAMIN D3) 25 MCG (1000 UT) tablet Take 1,000 Units by mouth daily.  Marland Kitchen epoetin alfa (EPOGEN,PROCRIT) 41324 UNIT/ML injection 10,000 Units. Every 2 to 4 weeks  . furosemide (LASIX) 40 MG tablet TAKE 1 TABLET BY MOUTH  DAILY  . glimepiride (AMARYL) 4 MG tablet TAKE 1 TABLET BY MOUTH  DAILY WITH BREAKFAST  . Hydrocortisone (PROCTOSOL HC RE) Place rectally as needed.  . Insulin Glargine (LANTUS) 100 UNIT/ML Solostar Pen Inject 10 Units into the skin at bedtime.  . Insulin Pen Needle 29G X 12.7MM MISC BD Ultrafine Pen Needle Use as instructed to inject insulin daily  . IRON PO Take by mouth 3 (three) times daily.   Marland Kitchen levothyroxine (SYNTHROID, LEVOTHROID) 175 MCG tablet TAKE 1 TABLET BY MOUTH  EVERY MORNING ON AN EMPTY  STOMACH WITH A FULL GLASS  OF WATER.  Marland Kitchen losartan (COZAAR) 50 MG tablet Take 1 tablet (50 mg total) by mouth daily.  .  Multiple Vitamin (MULTIVITAMIN) capsule Take 1 capsule by mouth daily.  . nitroGLYCERIN (NITROSTAT) 0.4 MG SL tablet Place 1 tablet (0.4 mg total) under the tongue every 5 (five) minutes as needed for chest pain.  . Omega-3 Fatty Acids (FISH OIL PO) Take 1,200 mg by mouth 4 (four) times daily.  Glory Rosebush DELICA LANCETS 40N MISC USE AS INSTRUCT TO TEST BLOOD SUGAR ONCE DAILY  . ONETOUCH VERIO test strip USE AS INSTRUCTED TO TEST BLOOD SUGAR ONCE DAILY  . pantoprazole (PROTONIX) 20 MG tablet TAKE 1 TABLET BY MOUTH  DAILY  . ranolazine (RANEXA) 500 MG 12 hr tablet TAKE 1 TABLET BY MOUTH TWO  TIMES DAILY  . timolol (TIMOPTIC) 0.5 % ophthalmic solution APPLY 1 DROP(S) IN BOTH EYES ONCE A DAY    Allergies: Heparin  Social History   Tobacco Use  . Smoking status: Former Smoker    Packs/day: 1.50    Years: 25.00    Pack years: 37.50    Types: Cigarettes    Quit date: 1978    Years since quitting: 42.5  . Smokeless tobacco: Never Used  Substance Use Topics  . Alcohol use: Yes    Alcohol/week: 1.0 standard drinks    Types: 1 Glasses of wine per week    Comment: socially   .  Drug use: No    Family History  Problem Relation Age of Onset  . Heart attack Mother   . Heart disease Mother   . Stroke Mother   . Diabetes Father   . Coronary artery disease Sister     Review of Systems: A 12-system review of systems was performed and was negative except as noted in the HPI.  --------------------------------------------------------------------------------------------------  Physical Exam: BP (!) 160/60 (BP Location: Left Arm, Patient Position: Sitting, Cuff Size: Normal)   Pulse 66   Ht 5' 10.5" (1.791 m)   Wt 161 lb (73 kg)   BMI 22.77 kg/m   Repeat BP 142/52  General: NAD. HEENT: No conjunctival pallor or scleral icterus.  Facemask in place. Neck: Supple without lymphadenopathy, thyromegaly, JVD, or HJR. Lungs: Normal work of breathing. Clear to auscultation bilaterally without  wheezes or crackles. Heart: Regular rate and rhythm without murmurs, rubs, or gallops. Non-displaced PMI. Abd: Bowel sounds present. Soft, NT/ND without hepatosplenomegaly Ext: No lower extremity edema. Skin: Warm and dry without rash.  EKG: Normal sinus rhythm with first-degree AV block and right bundle branch block.  PR interval has lengthened slightly since 03/18/2018.  Lab Results  Component Value Date   WBC 5.1 07/08/2018   HGB 9.1 (L) 08/31/2018   HCT 27.0 (L) 08/31/2018   MCV 100.6 (H) 07/08/2018   PLT 127 (L) 07/08/2018    Lab Results  Component Value Date   NA 137 04/06/2018   K 4.8 04/06/2018   CL 103 04/06/2018   CO2 28 04/06/2018   BUN 35 (H) 04/06/2018   CREATININE 1.53 (H) 04/06/2018   GLUCOSE 263 (H) 04/06/2018   ALT 18 04/06/2018    Lab Results  Component Value Date   CHOL 125 04/06/2018   HDL 35.60 (L) 04/06/2018   LDLCALC 61 04/06/2018   TRIG 143.0 04/06/2018   CHOLHDL 4 04/06/2018    --------------------------------------------------------------------------------------------------  ASSESSMENT AND PLAN: Coronary artery disease with stable angina: Tommy [name] continues to have episodes of chest pain, predominantly at night, that resolved with nitroglycerin.  He has severe underlying CAD status post CABG.  Last catheterization in 2017 performed at outside facility may note of only 2 patent conduits (LIMA to LAD and SVG to PDA).  We discussed repeat catheterization but have agreed to defer this given the patient's age and comorbidities.  Myocardial perfusion stress test would likely be abnormal and not add much to the clinical picture.  We will therefore focus on medical therapy.  He has been intolerant of isosorbide mononitrate in the past.  I am hesitant to escalate ranolazine in the setting of CKD.  His blood pressure today is elevated, though home readings are typically lower.  I recommended a trial of amlodipine 2.5 mg daily, though Tommy [name] would like to  defer this, as he has not had any chest pain over the last 5 to 6 days.  He will contact us if he has recurrent chest pain so that we can consider starting amlodipine.  Hypertension: Blood pressure elevated today, though home readings are typically lower.  We will continue his current medications.  Hyperlipidemia: Continue high intensity statin therapy.  Most recent LDL in February was at goal.  Anemia: Hemoglobin relatively stable at 9.1 on last check in late June.  Continue ongoing management per hematology and nephrology.  Chronic kidney disease stage III: Continue management per Dr. Holley Raring.  Follow-up: Return to clinic in 3 months.  Nelva Bush, MD 09/23/2018 3:09 PM

## 2018-09-23 ENCOUNTER — Other Ambulatory Visit: Payer: Self-pay

## 2018-09-23 ENCOUNTER — Ambulatory Visit (INDEPENDENT_AMBULATORY_CARE_PROVIDER_SITE_OTHER): Payer: Medicare Other | Admitting: Internal Medicine

## 2018-09-23 ENCOUNTER — Encounter: Payer: Self-pay | Admitting: Internal Medicine

## 2018-09-23 VITALS — BP 160/60 | HR 66 | Ht 70.5 in | Wt 161.0 lb

## 2018-09-23 DIAGNOSIS — N183 Chronic kidney disease, stage 3 unspecified: Secondary | ICD-10-CM

## 2018-09-23 DIAGNOSIS — I10 Essential (primary) hypertension: Secondary | ICD-10-CM

## 2018-09-23 DIAGNOSIS — E785 Hyperlipidemia, unspecified: Secondary | ICD-10-CM | POA: Diagnosis not present

## 2018-09-23 DIAGNOSIS — D638 Anemia in other chronic diseases classified elsewhere: Secondary | ICD-10-CM

## 2018-09-23 DIAGNOSIS — I25118 Atherosclerotic heart disease of native coronary artery with other forms of angina pectoris: Secondary | ICD-10-CM

## 2018-09-23 NOTE — Patient Instructions (Signed)
Medication Instructions:  Your physician recommends that you continue on your current medications as directed. Please refer to the Current Medication list given to you today.  If you need a refill on your cardiac medications before your next appointment, please call your pharmacy.   Lab work: - None ordered.  If you have labs (blood work) drawn today and your tests are completely normal, you will receive your results only by: Marland Kitchen MyChart Message (if you have MyChart) OR . A paper copy in the mail If you have any lab test that is abnormal or we need to change your treatment, we will call you to review the results.  Testing/Procedures: - None ordered.   Follow-Up: At [facility], you and your health needs are our priority.  As part of our continuing mission to provide you with exceptional heart care, we have created designated Provider Care Teams.  These Care Teams include your primary Cardiologist (physician) and Advanced Practice Providers (APPs -  Physician Assistants and Nurse Practitioners) who all work together to provide you with the care you need, when you need it. You will need a follow up appointment in 3 months.  Please call our office 2 months in advance to schedule this appointment.  You may see DR Harrell Gave END or one of the following Advanced Practice Providers on your designated Care Team:   Murray Hodgkins, NP Christell Faith, PA-C . Marrianne Mood, PA-C

## 2018-09-24 ENCOUNTER — Encounter: Payer: Self-pay | Admitting: Internal Medicine

## 2018-09-28 ENCOUNTER — Other Ambulatory Visit: Payer: Self-pay

## 2018-09-28 ENCOUNTER — Inpatient Hospital Stay: Payer: Medicare Other

## 2018-09-28 ENCOUNTER — Inpatient Hospital Stay: Payer: Medicare Other | Attending: Oncology

## 2018-09-28 VITALS — BP 147/68 | HR 64 | Temp 97.3°F | Resp 21

## 2018-09-28 DIAGNOSIS — N183 Chronic kidney disease, stage 3 unspecified: Secondary | ICD-10-CM

## 2018-09-28 DIAGNOSIS — Z79899 Other long term (current) drug therapy: Secondary | ICD-10-CM | POA: Diagnosis not present

## 2018-09-28 DIAGNOSIS — I129 Hypertensive chronic kidney disease with stage 1 through stage 4 chronic kidney disease, or unspecified chronic kidney disease: Secondary | ICD-10-CM | POA: Diagnosis not present

## 2018-09-28 DIAGNOSIS — D631 Anemia in chronic kidney disease: Secondary | ICD-10-CM | POA: Insufficient documentation

## 2018-09-28 LAB — HEMOGLOBIN AND HEMATOCRIT, BLOOD
HCT: 28.3 % — ABNORMAL LOW (ref 39.0–52.0)
Hemoglobin: 9.5 g/dL — ABNORMAL LOW (ref 13.0–17.0)

## 2018-09-28 MED ORDER — EPOETIN ALFA 20000 UNIT/ML IJ SOLN
20000.0000 [IU] | Freq: Once | INTRAMUSCULAR | Status: AC
  Administered 2018-09-28: 20000 [IU] via SUBCUTANEOUS

## 2018-10-08 ENCOUNTER — Other Ambulatory Visit: Payer: Self-pay | Admitting: Primary Care

## 2018-10-08 DIAGNOSIS — I8393 Asymptomatic varicose veins of bilateral lower extremities: Secondary | ICD-10-CM

## 2018-10-09 ENCOUNTER — Ambulatory Visit: Payer: Medicare Other | Admitting: Primary Care

## 2018-10-09 NOTE — Telephone Encounter (Signed)
Last filled on 06/15/2018 #30 g 0 refill. LOV with Anda Kraft was on 04/10/2018. Future appointment on 10/13/2018

## 2018-10-13 ENCOUNTER — Other Ambulatory Visit: Payer: Self-pay

## 2018-10-13 ENCOUNTER — Ambulatory Visit (INDEPENDENT_AMBULATORY_CARE_PROVIDER_SITE_OTHER): Payer: Medicare Other | Admitting: Primary Care

## 2018-10-13 VITALS — BP 136/72 | HR 64 | Temp 98.3°F | Ht 70.5 in | Wt 162.0 lb

## 2018-10-13 DIAGNOSIS — E119 Type 2 diabetes mellitus without complications: Secondary | ICD-10-CM | POA: Diagnosis not present

## 2018-10-13 DIAGNOSIS — I6523 Occlusion and stenosis of bilateral carotid arteries: Secondary | ICD-10-CM | POA: Diagnosis not present

## 2018-10-13 DIAGNOSIS — Z794 Long term (current) use of insulin: Secondary | ICD-10-CM

## 2018-10-13 LAB — POCT GLYCOSYLATED HEMOGLOBIN (HGB A1C): Hemoglobin A1C: 6.1 % — AB (ref 4.0–5.6)

## 2018-10-13 MED ORDER — INSULIN GLARGINE 100 UNIT/ML SOLOSTAR PEN: 8.0000 [IU] | PEN_INJECTOR | Freq: Every day | SUBCUTANEOUS | 5 refills | Status: DC

## 2018-10-13 NOTE — Patient Instructions (Addendum)
Reduce your Lantus insulin to 8 units nightly. Continue glimepiride 4 mg as you prefer.  Continue to work on a healthy diet. Continue to exercise regularly.  We will see you in February 2021 as scheduled.  It was a pleasure to see you today!

## 2018-10-13 NOTE — Progress Notes (Signed)
Subjective:    Patient ID: Tommy [name], male    DOB: [birth year], 85 y.o.   MRN: [id number]  HPI  Tommy [name] is a 85 year old male who presents today for follow up.  Current medications include: Lantus 10 units HS, Glimepiride 4 mg daily.  He is checking his blood glucose 1 times daily and is getting readings of:  AM fasting 70's-low 100's  Last A1C: 6.8 in February 2020 Last Eye Exam: Due in January 2021 Last Foot Exam: Due Pneumonia Vaccination: Completed in 2019 ACE/ARB: losartan  Statin: atorvastatin   Diet currently consists of:  Breakfast: Oatmeal, cereal, eggs Lunch: Snacking mostly Dinner: Protein, vegetable, salad, starch, pasta Snacks: Fruit, chips and salsa Desserts: Nightly  Beverages: Coffee, juice, water  Exercise: Walking on the treadmill three times weekly  BP Readings from Last 3 Encounters:  10/13/18 136/72  09/28/18 (!) 147/68  09/23/18 (!) 160/60      Review of Systems  Eyes: Negative for visual disturbance.  Respiratory: Negative for shortness of breath.   Neurological: Negative for dizziness and headaches.       Past Medical History:  Diagnosis Date  . Anemia   . Carotid artery occlusion    LEFT  . CKD (chronic kidney disease) stage 3, GFR 30-59 ml/min (HCC)   . Coronary artery disease   . Diabetes mellitus without complication ([place])   . Dyslipidemia   . GERD (gastroesophageal reflux disease)   . Hemorrhoids   . 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 Hypertension   . Pulmonary emboli ([place])   . Pulmonary embolism ([place])   . Thyroid disease      Social History   Socioeconomic History  . Marital status: Widowed    Spouse name: Not on file  . Number of children: 4  . Years of education: 16  . Highest education level: Bachelor's degree (e.g., BA, AB, BS)  Occupational History  . Occupation: retired  Scientific laboratory technician  . Financial resource strain: Not hard at all  . Food insecurity    Worry: Never true    Inability:  Never true  . Transportation needs    Medical: No    Non-medical: No  Tobacco Use  . Smoking status: Former Smoker    Packs/day: 1.50    Years: 25.00    Pack years: 37.50    Types: Cigarettes    Quit date: 1978    Years since quitting: 42.6  . Smokeless tobacco: Never Used  Substance and Sexual Activity  . Alcohol use: Yes    Alcohol/week: 1.0 standard drinks    Types: 1 Glasses of wine per week    Comment: socially   . Drug use: No  . Sexual activity: Not Currently  Lifestyle  . Physical activity    Days per week: Not on file    Minutes per session: Not on file  . Stress: Not on file  Relationships  . Social Herbalist on phone: Not on file    Gets together: Not on file    Attends religious service: Not on file    Active member of club or organization: Not on file    Attends meetings of clubs or organizations: Not on file    Relationship status: Not on file  . Intimate partner violence    Fear of current or ex partner: Not on file    Emotionally abused: Not on file    Physically abused: Not on file  Forced sexual activity: Not on file  Other Topics Concern  . Not on file  Social History Narrative   Widower.   4 children, 7 grandchildren, 1 great grandchild.   Retired. Once worked as an account.    Enjoys exercising, spending time with family.     Past Surgical History:  Procedure Laterality Date  . CAROTID ENDARTERECTOMY Left 2004  . CORONARY ARTERY [name] GRAFT  1994   Tommy [name]  . TONSILLECTOMY AND ADENOIDECTOMY      Family History  Problem Relation Age of Onset  . Heart attack Mother   . Heart disease Mother   . Stroke Mother   . Diabetes Father   . Coronary artery disease Sister     Allergies  Allergen Reactions  . Heparin     Current Outpatient Medications on File Prior to Visit  Medication Sig Dispense Refill  . aspirin 81 MG tablet Take 81 mg by mouth every evening.     Marland Kitchen atorvastatin (LIPITOR) 40 MG tablet TAKE 1  TABLET BY MOUTH  DAILY 90 tablet 1  . betamethasone valerate (VALISONE) 0.1 % cream APPLY TO AFFECTED AREA TWICE A DAY 30 g 0  . Blood Glucose Monitoring Suppl (ONETOUCH VERIO) w/Device KIT 1 Device by Does not apply route daily. Use as instructed to test blood sugar daily 1 kit 0  . CALCIUM PO Take 600 mg by mouth every morning.     . carvedilol (COREG) 6.25 MG tablet TAKE 1 TABLET BY MOUTH  TWICE A DAY WITH MEALS 180 tablet 1  . cholecalciferol (VITAMIN D3) 25 MCG (1000 UT) tablet Take 1,000 Units by mouth daily.    Marland Kitchen epoetin alfa (EPOGEN,PROCRIT) 09811 UNIT/ML injection 10,000 Units. Every 2 to 4 weeks    . furosemide (LASIX) 40 MG tablet TAKE 1 TABLET BY MOUTH  DAILY 120 tablet 1  . glimepiride (AMARYL) 4 MG tablet TAKE 1 TABLET BY MOUTH  DAILY WITH BREAKFAST 90 tablet 1  . Hydrocortisone (PROCTOSOL HC RE) Place rectally as needed.    . Insulin Glargine (LANTUS) 100 UNIT/ML Solostar Pen Inject 10 Units into the skin at bedtime. 15 mL 5  . Insulin Pen Needle 29G X 12.7MM MISC BD Ultrafine Pen Needle Use as instructed to inject insulin daily 300 each 1  . IRON PO Take by mouth 3 (three) times daily.     Marland Kitchen levothyroxine (SYNTHROID, LEVOTHROID) 175 MCG tablet TAKE 1 TABLET BY MOUTH  EVERY MORNING ON AN EMPTY  STOMACH WITH A FULL GLASS  OF WATER. 90 tablet 1  . losartan (COZAAR) 50 MG tablet Take 1 tablet (50 mg total) by mouth daily. 90 tablet 3  . Multiple Vitamin (MULTIVITAMIN) capsule Take 1 capsule by mouth daily.    . nitroGLYCERIN (NITROSTAT) 0.4 MG SL tablet Place 1 tablet (0.4 mg total) under the tongue every 5 (five) minutes as needed for chest pain. 25 tablet 3  . Omega-3 Fatty Acids (FISH OIL PO) Take 1,200 mg by mouth 4 (four) times daily.    Tommy [name] DELICA LANCETS 91Y MISC USE AS INSTRUCT TO TEST BLOOD SUGAR ONCE DAILY 100 each 4  . ONETOUCH VERIO test strip USE AS INSTRUCTED TO TEST BLOOD SUGAR ONCE DAILY 100 each 5  . pantoprazole (PROTONIX) 20 MG tablet TAKE 1 TABLET BY MOUTH   DAILY 90 tablet 1  . ranolazine (RANEXA) 500 MG 12 hr tablet TAKE 1 TABLET BY MOUTH TWO  TIMES DAILY 180 tablet 0  . timolol (TIMOPTIC) 0.5 %  ophthalmic solution APPLY 1 DROP(S) IN BOTH EYES ONCE A DAY  11   No current facility-administered medications on file prior to visit.     BP 136/72   Pulse 64   Temp 98.3 F (36.8 C) (Temporal)   Ht 5' 10.5" (1.791 m)   Wt 162 lb (73.5 kg)   SpO2 97%   BMI 22.92 kg/m    Objective:   Physical Exam  Constitutional: He appears well-nourished.  Neck: Neck supple.  Cardiovascular: Normal rate and regular rhythm.  Respiratory: Effort normal and breath sounds normal.  Skin: Skin is warm and dry.           Assessment & Plan:

## 2018-10-13 NOTE — Assessment & Plan Note (Addendum)
A1C today of 6.1 which is under excellent control. He continues to prefer to take his glimepiride 4 mg, continue same. Reduce Lantus to 8 units, then 5 units if glucose readings remain stable. Goal would be to come off of insulin.  Managed on statin and ARB. Eye exam UTD. Pneumonia vaccination UTD. Foot exam today.  Follow up in 6 months.

## 2018-10-26 ENCOUNTER — Other Ambulatory Visit: Payer: Self-pay | Admitting: Primary Care

## 2018-10-26 ENCOUNTER — Other Ambulatory Visit: Payer: Self-pay | Admitting: Internal Medicine

## 2018-10-26 ENCOUNTER — Inpatient Hospital Stay: Payer: Medicare Other

## 2018-10-26 ENCOUNTER — Inpatient Hospital Stay: Payer: Medicare Other | Attending: Oncology

## 2018-10-26 ENCOUNTER — Inpatient Hospital Stay (HOSPITAL_BASED_OUTPATIENT_CLINIC_OR_DEPARTMENT_OTHER): Payer: Medicare Other | Admitting: Oncology

## 2018-10-26 ENCOUNTER — Encounter: Payer: Self-pay | Admitting: Oncology

## 2018-10-26 ENCOUNTER — Other Ambulatory Visit: Payer: Self-pay

## 2018-10-26 VITALS — BP 163/62 | HR 56 | Temp 97.2°F | Resp 16 | Wt 161.1 lb

## 2018-10-26 DIAGNOSIS — R5383 Other fatigue: Secondary | ICD-10-CM | POA: Diagnosis not present

## 2018-10-26 DIAGNOSIS — I2510 Atherosclerotic heart disease of native coronary artery without angina pectoris: Secondary | ICD-10-CM | POA: Diagnosis not present

## 2018-10-26 DIAGNOSIS — I129 Hypertensive chronic kidney disease with stage 1 through stage 4 chronic kidney disease, or unspecified chronic kidney disease: Secondary | ICD-10-CM | POA: Diagnosis not present

## 2018-10-26 DIAGNOSIS — R531 Weakness: Secondary | ICD-10-CM | POA: Insufficient documentation

## 2018-10-26 DIAGNOSIS — D539 Nutritional anemia, unspecified: Secondary | ICD-10-CM | POA: Diagnosis not present

## 2018-10-26 DIAGNOSIS — D696 Thrombocytopenia, unspecified: Secondary | ICD-10-CM

## 2018-10-26 DIAGNOSIS — K219 Gastro-esophageal reflux disease without esophagitis: Secondary | ICD-10-CM | POA: Insufficient documentation

## 2018-10-26 DIAGNOSIS — Z79899 Other long term (current) drug therapy: Secondary | ICD-10-CM | POA: Diagnosis not present

## 2018-10-26 DIAGNOSIS — E785 Hyperlipidemia, unspecified: Secondary | ICD-10-CM | POA: Diagnosis not present

## 2018-10-26 DIAGNOSIS — E119 Type 2 diabetes mellitus without complications: Secondary | ICD-10-CM | POA: Insufficient documentation

## 2018-10-26 DIAGNOSIS — D631 Anemia in chronic kidney disease: Secondary | ICD-10-CM | POA: Insufficient documentation

## 2018-10-26 DIAGNOSIS — N183 Chronic kidney disease, stage 3 unspecified: Secondary | ICD-10-CM

## 2018-10-26 DIAGNOSIS — Z87891 Personal history of nicotine dependence: Secondary | ICD-10-CM | POA: Diagnosis not present

## 2018-10-26 DIAGNOSIS — Z86711 Personal history of pulmonary embolism: Secondary | ICD-10-CM | POA: Diagnosis not present

## 2018-10-26 DIAGNOSIS — Z7982 Long term (current) use of aspirin: Secondary | ICD-10-CM | POA: Insufficient documentation

## 2018-10-26 DIAGNOSIS — E039 Hypothyroidism, unspecified: Secondary | ICD-10-CM

## 2018-10-26 DIAGNOSIS — E079 Disorder of thyroid, unspecified: Secondary | ICD-10-CM | POA: Insufficient documentation

## 2018-10-26 LAB — CBC WITH DIFFERENTIAL/PLATELET
Abs Immature Granulocytes: 0.02 10*3/uL (ref 0.00–0.07)
Basophils Absolute: 0 10*3/uL (ref 0.0–0.1)
Basophils Relative: 1 %
Eosinophils Absolute: 0.2 10*3/uL (ref 0.0–0.5)
Eosinophils Relative: 3 %
HCT: 28.6 % — ABNORMAL LOW (ref 39.0–52.0)
Hemoglobin: 9.4 g/dL — ABNORMAL LOW (ref 13.0–17.0)
Immature Granulocytes: 0 %
Lymphocytes Relative: 23 %
Lymphs Abs: 1.3 10*3/uL (ref 0.7–4.0)
MCH: 33.5 pg (ref 26.0–34.0)
MCHC: 32.9 g/dL (ref 30.0–36.0)
MCV: 101.8 fL — ABNORMAL HIGH (ref 80.0–100.0)
Monocytes Absolute: 0.6 10*3/uL (ref 0.1–1.0)
Monocytes Relative: 11 %
Neutro Abs: 3.6 10*3/uL (ref 1.7–7.7)
Neutrophils Relative %: 62 %
Platelets: 128 10*3/uL — ABNORMAL LOW (ref 150–400)
RBC: 2.81 MIL/uL — ABNORMAL LOW (ref 4.22–5.81)
RDW: 12.6 % (ref 11.5–15.5)
WBC: 5.8 10*3/uL (ref 4.0–10.5)
nRBC: 0 % (ref 0.0–0.2)

## 2018-10-26 LAB — RETIC PANEL
Immature Retic Fract: 10 % (ref 2.3–15.9)
RBC.: 2.81 MIL/uL — ABNORMAL LOW (ref 4.22–5.81)
Retic Count, Absolute: 41 10*3/uL (ref 19.0–186.0)
Retic Ct Pct: 1.5 % (ref 0.4–3.1)
Reticulocyte Hemoglobin: 37.9 pg (ref >27.9)

## 2018-10-26 MED ORDER — EPOETIN ALFA-EPBX 10000 UNIT/ML IJ SOLN
20000.0000 [IU] | Freq: Once | INTRAMUSCULAR | Status: AC
  Administered 2018-10-26: 20000 [IU] via SUBCUTANEOUS
  Filled 2018-10-26: qty 2

## 2018-10-26 NOTE — Progress Notes (Unsigned)
MD approves to proceed with Retacrit with bp 163/62.

## 2018-10-26 NOTE — Progress Notes (Signed)
Patient does not offer any problems today.  

## 2018-10-26 NOTE — Progress Notes (Signed)
Hematology/Oncology follow up note [facility] [phone] Fax:([phone]   Patient Care Team: Pleas Koch, NP as PCP - General (Internal Medicine) Estill Cotta, MD as Referring Physician (Ophthalmology) End, Harrell Gave, MD as Consulting Physician (Cardiology)  REFERRING PROVIDER: Dr.Lateef REASON FOR VISIT Follow up for treatment of anemia of CKD, on erythropoietin replacement therapy.  HISTORY OF PRESENTING ILLNESS:  Tommy [name] is a  85 y.o.  male with PMH listed below who was referred to me for evaluation of anemia.  Patient recently removed from [place] to [place] to live with his son.  He has chronic kidney disease and anemia associated with CKD.  He reports that he has been getting Procrit 10,000 units every 2 weeks for many years.  Denies any recent thrombosis events.  Patient reports that at the last Procrit shot he received was back in September 2018 before he moved here. He establish care with Dr. Holley Raring for his CKD and he was referred to me for evaluation of anemia and possible Procrit shots.  Patient had lab work done at [facility] on 05/15/2017 hemoglobin 9.6, MCV 98, WBC 4.5, platelet count 1 70,000, normal differential use.   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 He has IVC filter in place.   INTERVAL HISTORY Tommy [name] is a 85 y.o. male who has above history reviewed by me today presents for follow-up for anemia due to CKD. Patient has been on erythropoietin therapy monthly if hemoglobin is less than 10. Patient reports feeling well. He does not feel any difference between having a hemoglobin above or below 10. Denies any shortness of breath, chest pain, lower extremity swelling or abdominal pain. Chronic fatigue has not changed much No new complaints. He is going to have family reunion for his 89th birthday in  October 2020.  Review of Systems  Constitutional: Positive for malaise/fatigue. Negative for chills, fever and weight loss.  HENT: Negative for sore throat.   Eyes: Negative for redness.  Respiratory: Negative for cough, shortness of breath and wheezing.   Cardiovascular: Negative for chest pain, palpitations and leg swelling.  Gastrointestinal: Negative for abdominal pain, blood in stool, nausea and vomiting.  Genitourinary: Negative for dysuria.  Musculoskeletal: Negative for myalgias.  Skin: Negative for rash.  Neurological: Negative for dizziness, tingling and tremors.  Endo/Heme/Allergies: Does not bruise/bleed easily.  Psychiatric/Behavioral: Negative for hallucinations.    MEDICAL HISTORY:  Past Medical History:  Diagnosis Date  . Anemia   . Carotid artery occlusion    LEFT  . CKD (chronic kidney disease) stage 3, GFR 30-59 ml/min (HCC)   . Coronary artery disease   . Diabetes mellitus without complication ([place])   . Dyslipidemia   . GERD (gastroesophageal reflux disease)   . Hemorrhoids   . 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 Hypertension   . Pulmonary emboli ([place])   . Pulmonary embolism ([place])   . Thyroid disease     SURGICAL HISTORY: Past Surgical History:  Procedure Laterality Date  . CAROTID ENDARTERECTOMY Left 2004  . CORONARY ARTERY BYPASS GRAFT  1994   [name]  . TONSILLECTOMY AND ADENOIDECTOMY      SOCIAL HISTORY: Social History   Socioeconomic History  . Marital status: Widowed    Spouse name: Not on file  . Number of children: 4  . Years of education: 16  . Highest education level: Bachelor's degree (e.g.,  BA, AB, BS)  Occupational History  . Occupation: retired  Scientific laboratory technician  . Financial resource strain: Not hard at all  . Food insecurity    Worry: Never true    Inability: Never true  . Transportation needs    Medical: No    Non-medical: No  Tobacco Use  . Smoking status: Former Smoker    Packs/day: 1.50    Years:  25.00    Pack years: 37.50    Types: Cigarettes    Quit date: 1978    Years since quitting: 42.6  . Smokeless tobacco: Never Used  Substance and Sexual Activity  . Alcohol use: Yes    Alcohol/week: 1.0 standard drinks    Types: 1 Glasses of wine per week    Comment: socially   . Drug use: No  . Sexual activity: Not Currently  Lifestyle  . Physical activity    Days per week: Not on file    Minutes per session: Not on file  . Stress: Not on file  Relationships  . Social Herbalist on phone: Not on file    Gets together: Not on file    Attends religious service: Not on file    Active member of club or organization: Not on file    Attends meetings of clubs or organizations: Not on file    Relationship status: Not on file  . Intimate partner violence    Fear of current or ex partner: Not on file    Emotionally abused: Not on file    Physically abused: Not on file    Forced sexual activity: Not on file  Other Topics Concern  . Not on file  Social History Narrative   Widower.   4 children, 7 grandchildren, 1 great grandchild.   Retired. Once worked as an account.    Enjoys exercising, spending time with family.     FAMILY HISTORY: Family History  Problem Relation Age of Onset  . Heart attack Mother   . Heart disease Mother   . Stroke Mother   . Diabetes Father   . Coronary artery disease Sister     ALLERGIES:  is allergic to heparin.  MEDICATIONS:  Current Outpatient Medications  Medication Sig Dispense Refill  . aspirin 81 MG tablet Take 81 mg by mouth every evening.     Marland Kitchen atorvastatin (LIPITOR) 40 MG tablet TAKE 1 TABLET BY MOUTH  DAILY 90 tablet 1  . betamethasone valerate (VALISONE) 0.1 % cream APPLY TO AFFECTED AREA TWICE A DAY 30 g 0  . Blood Glucose Monitoring Suppl (ONETOUCH VERIO) w/Device KIT 1 Device by Does not apply route daily. Use as instructed to test blood sugar daily 1 kit 0  . CALCIUM PO Take 600 mg by mouth every morning.     .  carvedilol (COREG) 6.25 MG tablet TAKE 1 TABLET BY MOUTH  TWICE A DAY WITH MEALS 180 tablet 1  . cholecalciferol (VITAMIN D3) 25 MCG (1000 UT) tablet Take 1,000 Units by mouth daily.    Marland Kitchen epoetin alfa (EPOGEN,PROCRIT) 98119 UNIT/ML injection 10,000 Units. Every 2 to 4 weeks    . furosemide (LASIX) 40 MG tablet TAKE 1 TABLET BY MOUTH  DAILY 120 tablet 1  . glimepiride (AMARYL) 4 MG tablet TAKE 1 TABLET BY MOUTH  DAILY WITH BREAKFAST 90 tablet 1  . Hydrocortisone (PROCTOSOL HC RE) Place rectally as needed.    . Insulin Glargine (LANTUS) 100 UNIT/ML Solostar Pen Inject 8 Units into the  skin at bedtime. 15 mL 5  . Insulin Pen Needle 29G X 12.7MM MISC BD Ultrafine Pen Needle Use as instructed to inject insulin daily 300 each 1  . IRON PO Take by mouth 3 (three) times daily.     Marland Kitchen levothyroxine (SYNTHROID, LEVOTHROID) 175 MCG tablet TAKE 1 TABLET BY MOUTH  EVERY MORNING ON AN EMPTY  STOMACH WITH A FULL GLASS  OF WATER. 90 tablet 1  . Multiple Vitamin (MULTIVITAMIN) capsule Take 1 capsule by mouth daily.    . Omega-3 Fatty Acids (FISH OIL PO) Take 1,200 mg by mouth 4 (four) times daily.    Glory Rosebush DELICA LANCETS 01B MISC USE AS INSTRUCT TO TEST BLOOD SUGAR ONCE DAILY 100 each 4  . ONETOUCH VERIO test strip USE AS INSTRUCTED TO TEST BLOOD SUGAR ONCE DAILY 100 each 5  . pantoprazole (PROTONIX) 20 MG tablet TAKE 1 TABLET BY MOUTH  DAILY 90 tablet 1  . ranolazine (RANEXA) 500 MG 12 hr tablet TAKE 1 TABLET BY MOUTH  TWICE DAILY 180 tablet 3  . timolol (TIMOPTIC) 0.5 % ophthalmic solution APPLY 1 DROP(S) IN BOTH EYES ONCE A DAY  11  . losartan (COZAAR) 50 MG tablet Take 1 tablet (50 mg total) by mouth daily. 90 tablet 3  . nitroGLYCERIN (NITROSTAT) 0.4 MG SL tablet Place 1 tablet (0.4 mg total) under the tongue every 5 (five) minutes as needed for chest pain. 25 tablet 3   No current facility-administered medications for this visit.      PHYSICAL EXAMINATION: ECOG PERFORMANCE STATUS: 1 - Symptomatic  but completely ambulatory Vitals:   10/26/18 1347  BP: (!) 163/62  Pulse: (!) 56  Resp: 16  Temp: (!) 97.2 F (36.2 C)   Filed Weights   10/26/18 1347  Weight: 161 lb 1.6 oz (73.1 kg)    Physical Exam  Constitutional: He is oriented to person, place, and time. No distress.  Thin, walk in.   HENT:  Head: Normocephalic and atraumatic.  Nose: Nose normal.  Mouth/Throat: Oropharynx is clear and moist. No oropharyngeal exudate.  Eyes: Pupils are equal, round, and reactive to light. EOM are normal. Right eye exhibits no discharge. Left eye exhibits no discharge. No scleral icterus.  Neck: Normal range of motion. Neck supple. No JVD present.  Cardiovascular: Normal rate, regular rhythm and normal heart sounds.  No murmur heard. Pulmonary/Chest: Effort normal. No respiratory distress. He has no rales. He exhibits no tenderness.  Abdominal: Soft. Bowel sounds are normal. He exhibits no distension and no mass. There is no abdominal tenderness. There is no rebound.  Musculoskeletal: Normal range of motion.        General: No tenderness or edema.  Lymphadenopathy:    He has no cervical adenopathy.  Neurological: He is alert and oriented to person, place, and time. No cranial nerve deficit. He exhibits normal muscle tone. Coordination normal.  Skin: Skin is warm and dry. No rash noted. He is not diaphoretic. No erythema.  Psychiatric: Affect and judgment normal.     LABORATORY DATA:  I have reviewed the data as listed Lab Results  Component Value Date   WBC 5.8 10/26/2018   HGB 9.4 (L) 10/26/2018   HCT 28.6 (L) 10/26/2018   MCV 101.8 (H) 10/26/2018   PLT 128 (L) 10/26/2018   Recent Labs    04/06/18 1237  NA 137  K 4.8  CL 103  CO2 28  GLUCOSE 263*  BUN 35*  CREATININE 1.53*  CALCIUM 9.5  PROT 7.1  ALBUMIN 4.0  AST 19  ALT 18  ALKPHOS 71  BILITOT 0.4     Iron/TIBC/Ferritin/ %Sat    Component Value Date/Time   IRON 109 05/23/2017 1153   TIBC 311 05/23/2017 1153    FERRITIN 670 (H) 05/23/2017 1153   IRONPCTSAT 35 05/23/2017 1153   SPEP negative for M spike.   RADIOGRAPHIC STUDIES: I have personally reviewed the radiological images as listed and agreed with the findings in the report. 01/19/2018 CXR FINDINGS: The lungs are clear and negative for focal airspace consolidation, pulmonary edema or suspicious pulmonary nodule. No pleural effusion or pneumothorax. Cardiac and mediastinal contours are within normal limits. Patient is status post median sternotomy with evidence of prior CABG including LIMA bypass. Trace linear scarring versus atelectasis in the lingula. No acute fracture or lytic or blastic osseous lesions. The visualized upper abdominal bowel gas pattern is unremarkable.  IMPRESSION: No active cardiopulmonary disease. ASSESSMENT & PLAN:  1. Anemia of chronic renal failure, stage 3 (moderate) (HCC)   2. Thrombocytopenia ([place])     #Labs are reviewed and discussed with patient. Globin is below 10. We will proceed with Retacrit 20,000 today. Continue H&H every 4 weeks and proceed with Retacrit 20,000 if hemoglobin is less than 10.  #Chronic thrombocytopenia, platelet counts 1 28,000, stable.  Previous work-up showed normal B12, folate, TSH.  Onset of thrombocytopenia in June 2019.  Questionable MDS.  Continue to monitor #Macrocytic anemia, normal folate and B12 level.  Continue to monitor. All questions were answered. The patient knows to call the clinic with any problems questions or concerns. Orders Placed This Encounter  Procedures  . CBC with Differential/Platelet    Standing Status:   Future    Standing Expiration Date:   10/26/2019    Return of visit:  Monthly H&H +/- procrit. Follow up in clinic in 3 months.     Earlie Server, MD, PhD  10/26/2018

## 2018-10-27 ENCOUNTER — Ambulatory Visit: Payer: Medicare Other

## 2018-10-27 ENCOUNTER — Telehealth: Payer: Self-pay

## 2018-10-27 NOTE — Telephone Encounter (Signed)
Noted  

## 2018-10-27 NOTE — Telephone Encounter (Signed)
Pt was on the nurse schedule today for his first Shingrix Vaccine. I called and left a message at 11:06 asking him to check with his insurance if they cover the vaccine in the physicians office or under Part D at the pharmacy.  Pt stated hs phone is messed up and did not get that message. He is going to call his insurance once he gets his phone working. Will reschedule if he can do it at the office. He did not want to take responsibility and sign a waiver for it.  Sent to [facility] as an [place].

## 2018-11-12 DIAGNOSIS — N183 Chronic kidney disease, stage 3 (moderate): Secondary | ICD-10-CM | POA: Diagnosis not present

## 2018-11-12 DIAGNOSIS — I10 Essential (primary) hypertension: Secondary | ICD-10-CM | POA: Diagnosis not present

## 2018-11-12 DIAGNOSIS — D631 Anemia in chronic kidney disease: Secondary | ICD-10-CM | POA: Diagnosis not present

## 2018-11-12 DIAGNOSIS — N2581 Secondary hyperparathyroidism of renal origin: Secondary | ICD-10-CM | POA: Diagnosis not present

## 2018-11-17 ENCOUNTER — Other Ambulatory Visit: Payer: Self-pay | Admitting: Primary Care

## 2018-11-17 DIAGNOSIS — Z23 Encounter for immunization: Secondary | ICD-10-CM | POA: Diagnosis not present

## 2018-11-23 ENCOUNTER — Other Ambulatory Visit: Payer: Self-pay | Admitting: Primary Care

## 2018-11-23 DIAGNOSIS — I2510 Atherosclerotic heart disease of native coronary artery without angina pectoris: Secondary | ICD-10-CM

## 2018-11-25 ENCOUNTER — Other Ambulatory Visit: Payer: Self-pay

## 2018-11-26 ENCOUNTER — Inpatient Hospital Stay: Payer: Medicare Other

## 2018-11-26 ENCOUNTER — Inpatient Hospital Stay: Payer: Medicare Other | Attending: Oncology

## 2018-11-26 ENCOUNTER — Other Ambulatory Visit: Payer: Self-pay

## 2018-11-26 DIAGNOSIS — N183 Chronic kidney disease, stage 3 unspecified: Secondary | ICD-10-CM

## 2018-11-26 DIAGNOSIS — I129 Hypertensive chronic kidney disease with stage 1 through stage 4 chronic kidney disease, or unspecified chronic kidney disease: Secondary | ICD-10-CM | POA: Diagnosis not present

## 2018-11-26 DIAGNOSIS — D631 Anemia in chronic kidney disease: Secondary | ICD-10-CM | POA: Diagnosis not present

## 2018-11-26 LAB — HEMOGLOBIN AND HEMATOCRIT, BLOOD
HCT: 30.2 % — ABNORMAL LOW (ref 39.0–52.0)
Hemoglobin: 10.1 g/dL — ABNORMAL LOW (ref 13.0–17.0)

## 2018-12-15 ENCOUNTER — Other Ambulatory Visit: Payer: Self-pay | Admitting: Primary Care

## 2018-12-15 DIAGNOSIS — I2510 Atherosclerotic heart disease of native coronary artery without angina pectoris: Secondary | ICD-10-CM

## 2018-12-21 NOTE — Progress Notes (Signed)
Follow-up Outpatient Visit Date: 12/23/2018  Primary Care Provider: Pleas Koch, NP [address] [place] [address]  Chief Complaint: Follow-up coronary artery disease  HPI:  Mr. Heinzman is a 85 y.o. year-old male with history of coronary artery disease status post remote CABG with stable angina, PAD, carotid artery stenosis status post left CEA, hypertension, hyperlipidemia, type 2 diabetes mellitus,CKD, and chronic anemia, who presents for follow-up of coronary artery disease.  I last saw him in July, at which time he was doing well.  However, he reported periods of nightly chest pain that would awaken him from sleep.  The pain would resolve promptly with SL NTG x 1.  Interesting, he was able to exercise on the treadmill without any difficulty.  I suggested trying amlodipine 2.5 mg daily for antianginal therapy but Mr. Musa declined.  Today, Mr. Custer reports that he is feeling better with less chest pain.  He hasn't needed any sublingual NTG for several months.  He continues to lift weights and walk on his treadmill daily without any chest pain or shortness of breath.  He has stable exertional dyspnea with very strenuous activity.  Brief orthostatic lightheadedness, which has been present for years, is unchanged.  He denies falling or passing out.  Home blood pressure is usually below 614 mmHg systolic.  --------------------------------------------------------------------------------------------------  Cardiovascular History & Procedures: Cardiovascular Problems:  Coronary artery disease status post CABG (1994)  Carotid artery stenosis status post left carotid endarterectomy (2004)  Abdominal aortic aneurysm  Risk Factors:  Known coronary artery disease, hypertension, hyperlipidemia, diabetes mellitus, male gender, and age  Cath/PCI:  LHC (02/12/16): Severe native disease with chronic total occlusions of the ostial LAD and LCx as well as 99% stenosis of distal  LMCA. 80% ostial RCA stenosis as well as diffuse mid RCA with tandem lesions of 85-99%. Patent LIMA to LAD and SVG to distal RCA. Additional SVG to unknown vessel is chronically occluded.  CV Surgery:  CABG (1994, [place], [place]): LIMA to LAD, SVG to distal RCA, and SVG to other unknown vessel.  Left carotid endarterectomy (2004)  EP Procedures and Devices:  None  Non-Invasive Evaluation(s):  TTE (02/06/17): Normal LV size. LVEF 55-60% with normal wall motion. Normal diastolic function. Aortic sclerosis without stenosis. Mild MR. Mild left atrial enlargement. Moderate TR. Upper normal to mildly elevated pulmonary artery pressure.  Carotid Doppler (02/06/17): Mild narrowing of bilateral internal carotid arteries (less than 40%. Less than 50% stenosis involving the right common carotid artery. Antegrade vertebral artery flow bilaterally. Normal flow in the subclavian arteries bilaterally.  Carotid artery Doppler (09/14/15): Moderate plaque in the right ICA with less than 50% stenosis.  Pharmacologic MPI (12/16/13): Mild inferior ischemia with LVEF of 58%.  TTE (12/02/13): Mild LVH with LVEF of 65% and grade 1 diastolic dysfunction. Aortic sclerosis, mild TR, and moderate pulmonary hypertension. Question PFO.  Aortoiliac ultrasound (08/09/13): No evidence of AAA. Moderately elevated right common iliac artery velocity with 50-75% stenosis.  Recent CV Pertinent Labs: Lab Results  Component Value Date   CHOL 125 04/06/2018   HDL 35.60 (L) 04/06/2018   LDLCALC 61 04/06/2018   TRIG 143.0 04/06/2018   CHOLHDL 4 04/06/2018   K 4.8 04/06/2018   BUN 35 (H) 04/06/2018   CREATININE 1.53 (H) 04/06/2018    Past medical and surgical history were reviewed and updated in EPIC.  Current Meds  Medication Sig  . aspirin 81 MG tablet Take 81 mg by mouth every evening.   Marland Kitchen  atorvastatin (LIPITOR) 40 MG tablet TAKE 1 TABLET BY MOUTH  DAILY  . betamethasone valerate (VALISONE) 0.1 %  cream APPLY TO AFFECTED AREA TWICE A DAY  . Blood Glucose Monitoring Suppl (ONETOUCH VERIO) w/Device KIT 1 Device by Does not apply route daily. Use as instructed to test blood sugar daily  . CALCIUM PO Take 600 mg by mouth every morning.   . carvedilol (COREG) 6.25 MG tablet TAKE 1 TABLET BY MOUTH  TWICE DAILY WITH MEALS  . cholecalciferol (VITAMIN D3) 25 MCG (1000 UT) tablet Take 1,000 Units by mouth daily.  Marland Kitchen epoetin alfa (EPOGEN,PROCRIT) 25638 UNIT/ML injection 10,000 Units. Every 2 to 4 weeks  . furosemide (LASIX) 40 MG tablet TAKE 1 TABLET BY MOUTH  DAILY  . glimepiride (AMARYL) 4 MG tablet TAKE 1 TABLET BY MOUTH  DAILY WITH BREAKFAST  . Hydrocortisone (PROCTOSOL HC RE) Place rectally as needed.  . Insulin Glargine (LANTUS) 100 UNIT/ML Solostar Pen Inject 8 Units into the skin at bedtime.  . Insulin Pen Needle 29G X 12.7MM MISC BD Ultrafine Pen Needle Use as instructed to inject insulin daily  . IRON PO Take by mouth 4 (four) times daily.   Marland Kitchen levothyroxine (SYNTHROID) 175 MCG tablet TAKE 1 TABLET BY MOUTH  EVERY MORNING ON AN EMPTY  STOMACH WITH A FULL GLASS  OF WATER.  . Multiple Vitamin (MULTIVITAMIN) capsule Take 1 capsule by mouth daily.  . Omega-3 Fatty Acids (FISH OIL PO) Take 1,200 mg by mouth 4 (four) times daily.  Glory Rosebush DELICA LANCETS 93T MISC USE AS INSTRUCT TO TEST BLOOD SUGAR ONCE DAILY  . ONETOUCH VERIO test strip USE AS INSTRUCTED TO TEST BLOOD SUGAR ONCE DAILY  . pantoprazole (PROTONIX) 20 MG tablet TAKE 1 TABLET BY MOUTH  DAILY  . ranolazine (RANEXA) 500 MG 12 hr tablet TAKE 1 TABLET BY MOUTH  TWICE DAILY  . timolol (TIMOPTIC) 0.5 % ophthalmic solution APPLY 1 DROP(S) IN BOTH EYES ONCE A DAY    Allergies: Heparin  Social History   Tobacco Use  . Smoking status: Former Smoker    Packs/day: 1.50    Years: 25.00    Pack years: 37.50    Types: Cigarettes    Quit date: 1978    Years since quitting: 42.8  . Smokeless tobacco: Never Used  Substance Use Topics   . Alcohol use: Yes    Alcohol/week: 1.0 standard drinks    Types: 1 Glasses of wine per week    Comment: socially   . Drug use: No    Family History  Problem Relation Age of Onset  . Heart attack Mother   . Heart disease Mother   . Stroke Mother   . Diabetes Father   . Coronary artery disease Sister     Review of Systems: A 12-system review of systems was performed and was negative except as noted in the HPI.  --------------------------------------------------------------------------------------------------  Physical Exam: BP 130/62 (BP Location: Left Arm, Patient Position: Sitting, Cuff Size: Normal)   Pulse (!) 59   Ht '5\' 10"'$  (1.778 m)   Wt 160 lb 8 oz (72.8 kg)   SpO2 99%   BMI 23.03 kg/m   General:  NAD HEENT: No conjunctival pallor or scleral icterus. Neck: Supple without lymphadenopathy, thyromegaly, JVD, or HJR. Lungs: Normal work of breathing. Clear to auscultation bilaterally without wheezes or crackles. Heart: Regular rate and rhythm without murmurs, rubs, or gallops. Non-displaced PMI. Abd: Bowel sounds present. Soft, NT/ND without hepatosplenomegaly Ext: No lower extremity  edema. Skin: Warm and dry without rash.  EKG:  Sinus bradycardia (HR 59 bpm) with 1st degree AV block (PR interval 242 ms) and RBBB.  Lab Results  Component Value Date   WBC 5.8 10/26/2018   HGB 10.1 (L) 11/26/2018   HCT 30.2 (L) 11/26/2018   MCV 101.8 (H) 10/26/2018   PLT 128 (L) 10/26/2018    Lab Results  Component Value Date   NA 137 04/06/2018   K 4.8 04/06/2018   CL 103 04/06/2018   CO2 28 04/06/2018   BUN 35 (H) 04/06/2018   CREATININE 1.53 (H) 04/06/2018   GLUCOSE 263 (H) 04/06/2018   ALT 18 04/06/2018    Lab Results  Component Value Date   CHOL 125 04/06/2018   HDL 35.60 (L) 04/06/2018   LDLCALC 61 04/06/2018   TRIG 143.0 04/06/2018   CHOLHDL 4 04/06/2018    --------------------------------------------------------------------------------------------------   ASSESSMENT AND PLAN: Coronary artery disease with stable angina: No significant chest pain reported today.  We will continue current antianginal regimen consisting of carvedilol and ranolazine.  He has been intolerant of isosorbide mononitrate in the past.  We will continue secondary prevention with aspirin 81 mg daily and atorvastatin 40 mg daily.  First degree AV block: Mild PR prolongation noted, minimally longer compared with 6 months ago.  RBBB still present.  I am reluctant to decrease/stop carvedilol, as this could worsen his stable angina.  We will need to follow his conduction disease closely.  Hypertension: Blood pressure is borderline elevated today but typically lower at home.  No medication changes recommended today.  Hyperlipidemia: LDL at goal on last check in 04/2018.  Continue atorvastatin 40 mg daily.  Chronic kidney disease stage III: Continue follow-up with nephrology.  Avoid nephrotoxic agents.  Anemia: Likely due to chronic disease in the setting of renal insuffiencey.  Continue follow-up with nephrology and hematology.  Carotid artery stenosis: No symptoms.  Mild disease noted on last study in 02/2017.  We will plan to repeat Dopplers at the patient's convenience to ensure stability.  Follow-up: Return to clinic in 6 months.  Nelva Bush, MD 12/24/2018 8:05 PM

## 2018-12-23 ENCOUNTER — Other Ambulatory Visit: Payer: Self-pay

## 2018-12-23 ENCOUNTER — Encounter: Payer: Self-pay | Admitting: Internal Medicine

## 2018-12-23 ENCOUNTER — Ambulatory Visit (INDEPENDENT_AMBULATORY_CARE_PROVIDER_SITE_OTHER): Payer: Medicare Other | Admitting: Internal Medicine

## 2018-12-23 VITALS — BP 130/62 | HR 59 | Ht 70.0 in | Wt 160.5 lb

## 2018-12-23 DIAGNOSIS — E785 Hyperlipidemia, unspecified: Secondary | ICD-10-CM

## 2018-12-23 DIAGNOSIS — I25118 Atherosclerotic heart disease of native coronary artery with other forms of angina pectoris: Secondary | ICD-10-CM | POA: Diagnosis not present

## 2018-12-23 DIAGNOSIS — D638 Anemia in other chronic diseases classified elsewhere: Secondary | ICD-10-CM

## 2018-12-23 DIAGNOSIS — I6523 Occlusion and stenosis of bilateral carotid arteries: Secondary | ICD-10-CM | POA: Diagnosis not present

## 2018-12-23 DIAGNOSIS — I440 Atrioventricular block, first degree: Secondary | ICD-10-CM | POA: Diagnosis not present

## 2018-12-23 DIAGNOSIS — N183 Chronic kidney disease, stage 3 unspecified: Secondary | ICD-10-CM

## 2018-12-23 DIAGNOSIS — I10 Essential (primary) hypertension: Secondary | ICD-10-CM | POA: Diagnosis not present

## 2018-12-23 NOTE — Patient Instructions (Signed)
Medication Instructions:  Your physician recommends that you continue on your current medications as directed. Please refer to the Current Medication list given to you today. *If you need a refill on your cardiac medications before your next appointment, please call your pharmacy*  Lab Work: NONE If you have labs (blood work) drawn today and your tests are completely normal, you will receive your results only by: Marland Kitchen MyChart Message (if you have MyChart) OR . A paper copy in the mail If you have any lab test that is abnormal or we need to change your treatment, we will call you to review the results.  Testing/Procedures: NONE  Follow-Up: At [facility], you and your health needs are our priority.  As part of our continuing mission to provide you with exceptional heart care, we have created designated Provider Care Teams.  These Care Teams include your primary Cardiologist (physician) and Advanced Practice Providers (APPs -  Physician Assistants and Nurse Practitioners) who all work together to provide you with the care you need, when you need it.  Your next appointment:   6 months  The format for your next appointment:   In Person  Provider:    You may see DR Harrell Gave END or one of the following Advanced Practice Providers on your designated Care Team:    Murray Hodgkins, NP  Christell Faith, PA-C  Marrianne Mood, PA-C

## 2018-12-24 ENCOUNTER — Other Ambulatory Visit: Payer: Self-pay

## 2018-12-24 ENCOUNTER — Encounter: Payer: Self-pay | Admitting: Internal Medicine

## 2018-12-24 DIAGNOSIS — I440 Atrioventricular block, first degree: Secondary | ICD-10-CM | POA: Insufficient documentation

## 2018-12-25 ENCOUNTER — Inpatient Hospital Stay: Payer: Medicare Other

## 2018-12-25 ENCOUNTER — Other Ambulatory Visit: Payer: Self-pay

## 2018-12-25 ENCOUNTER — Inpatient Hospital Stay: Payer: Medicare Other | Attending: Oncology

## 2018-12-25 ENCOUNTER — Telehealth: Payer: Self-pay | Admitting: *Deleted

## 2018-12-25 VITALS — BP 143/54 | HR 56

## 2018-12-25 DIAGNOSIS — I6523 Occlusion and stenosis of bilateral carotid arteries: Secondary | ICD-10-CM

## 2018-12-25 DIAGNOSIS — N183 Chronic kidney disease, stage 3 unspecified: Secondary | ICD-10-CM | POA: Diagnosis not present

## 2018-12-25 DIAGNOSIS — Z79899 Other long term (current) drug therapy: Secondary | ICD-10-CM | POA: Insufficient documentation

## 2018-12-25 DIAGNOSIS — D631 Anemia in chronic kidney disease: Secondary | ICD-10-CM

## 2018-12-25 DIAGNOSIS — I129 Hypertensive chronic kidney disease with stage 1 through stage 4 chronic kidney disease, or unspecified chronic kidney disease: Secondary | ICD-10-CM | POA: Diagnosis not present

## 2018-12-25 LAB — HEMOGLOBIN AND HEMATOCRIT, BLOOD
HCT: 28.4 % — ABNORMAL LOW (ref 39.0–52.0)
Hemoglobin: 9.2 g/dL — ABNORMAL LOW (ref 13.0–17.0)

## 2018-12-25 MED ORDER — EPOETIN ALFA-EPBX 10000 UNIT/ML IJ SOLN
20000.0000 [IU] | Freq: Once | INTRAMUSCULAR | Status: AC
  Administered 2018-12-25: 20000 [IU] via SUBCUTANEOUS
  Filled 2018-12-25: qty 2

## 2018-12-25 NOTE — Telephone Encounter (Signed)
-----   Message from Nelva Bush, MD sent at 12/24/2018  8:13 PM EDT ----- I neglected to mention to Tommy [name] at this week's visit that we should follow-up his carotid artery stenosis with carotid Dopplers at his convenience.  Could you reach out to him with this recommendation and help schedule the study (if he is agreeable with proceeding)?  Thanks.Gerald Stabs

## 2018-12-25 NOTE — Telephone Encounter (Signed)
Attempted to call patient. LMTCB 12/25/2018

## 2019-01-01 NOTE — Telephone Encounter (Signed)
Order for carotid dopplers entered. Routing to scheduling to reach out to patient to schedule. Please let me know if patient has any questions. Thanks!

## 2019-01-01 NOTE — Telephone Encounter (Signed)
lmov to call office.

## 2019-01-12 ENCOUNTER — Other Ambulatory Visit: Payer: Self-pay | Admitting: Primary Care

## 2019-01-12 ENCOUNTER — Other Ambulatory Visit: Payer: Self-pay | Admitting: Internal Medicine

## 2019-01-12 NOTE — Telephone Encounter (Signed)
Scheduled

## 2019-01-12 NOTE — Telephone Encounter (Signed)
Lmovm to verify medication not on last ov med list.

## 2019-01-20 ENCOUNTER — Other Ambulatory Visit: Payer: Self-pay | Admitting: Primary Care

## 2019-01-20 ENCOUNTER — Other Ambulatory Visit: Payer: Self-pay

## 2019-01-20 DIAGNOSIS — I2510 Atherosclerotic heart disease of native coronary artery without angina pectoris: Secondary | ICD-10-CM

## 2019-01-21 DIAGNOSIS — I350 Nonrheumatic aortic (valve) stenosis: Secondary | ICD-10-CM

## 2019-01-21 DIAGNOSIS — I714 Abdominal aortic aneurysm, without rupture, unspecified: Secondary | ICD-10-CM

## 2019-01-21 DIAGNOSIS — I771 Stricture of artery: Secondary | ICD-10-CM

## 2019-01-25 ENCOUNTER — Ambulatory Visit: Payer: Medicare Other

## 2019-01-25 ENCOUNTER — Ambulatory Visit: Payer: Medicare Other | Admitting: Oncology

## 2019-01-25 ENCOUNTER — Other Ambulatory Visit: Payer: Medicare Other

## 2019-01-25 NOTE — Telephone Encounter (Signed)
Attempted to schedule.  lmov to schedule.

## 2019-01-25 NOTE — Telephone Encounter (Signed)
Hi Anderson Malta,   Could you try to set Tommy [name] up for an aortoiliac ultrasounds for evaluation of AAA and iliac stenosis when he comes in for his carotid Doppler in December? Thanks.   Gerald Stabs     Order entered. Routing to scheduling to arrange for aorta/iliac and carotid doppler on the same day preferable. MyChart Message sent to patient letting him know someone will call him and with preprocedural instructions concerning aorta/iliac u/s.

## 2019-02-01 ENCOUNTER — Inpatient Hospital Stay: Payer: Medicare Other | Attending: Oncology

## 2019-02-01 ENCOUNTER — Inpatient Hospital Stay: Payer: Medicare Other

## 2019-02-01 ENCOUNTER — Inpatient Hospital Stay: Payer: Medicare Other | Admitting: Oncology

## 2019-02-01 NOTE — Telephone Encounter (Signed)
LM to try and R/S NS appt-ltg °

## 2019-02-05 ENCOUNTER — Other Ambulatory Visit: Payer: Self-pay | Admitting: Family Medicine

## 2019-02-05 ENCOUNTER — Encounter: Payer: Self-pay | Admitting: Oncology

## 2019-02-05 ENCOUNTER — Other Ambulatory Visit: Payer: Self-pay

## 2019-02-05 DIAGNOSIS — I8393 Asymptomatic varicose veins of bilateral lower extremities: Secondary | ICD-10-CM

## 2019-02-08 ENCOUNTER — Ambulatory Visit (INDEPENDENT_AMBULATORY_CARE_PROVIDER_SITE_OTHER): Payer: Medicare Other

## 2019-02-08 ENCOUNTER — Other Ambulatory Visit: Payer: Self-pay

## 2019-02-08 DIAGNOSIS — I6523 Occlusion and stenosis of bilateral carotid arteries: Secondary | ICD-10-CM

## 2019-02-08 MED ORDER — LOSARTAN POTASSIUM 50 MG PO TABS: 50.0000 mg | ORAL_TABLET | Freq: Every day | ORAL | 3 refills | Status: DC

## 2019-02-08 NOTE — Telephone Encounter (Signed)
I have him R/S to RTC on 02/11/19 for lab @ 930 /MD @ 10/+/- Retacrit Inj I have been unable to reach pt by phone. Please send him a MyChart message and I'll try calling him again.

## 2019-02-11 ENCOUNTER — Encounter: Payer: Self-pay | Admitting: Oncology

## 2019-02-11 ENCOUNTER — Inpatient Hospital Stay: Payer: Medicare Other | Attending: Oncology

## 2019-02-11 ENCOUNTER — Inpatient Hospital Stay (HOSPITAL_BASED_OUTPATIENT_CLINIC_OR_DEPARTMENT_OTHER): Payer: Medicare Other | Admitting: Oncology

## 2019-02-11 ENCOUNTER — Inpatient Hospital Stay: Payer: Medicare Other

## 2019-02-11 ENCOUNTER — Other Ambulatory Visit: Payer: Self-pay

## 2019-02-11 ENCOUNTER — Other Ambulatory Visit: Payer: Self-pay | Admitting: *Deleted

## 2019-02-11 VITALS — BP 152/69 | HR 53 | Temp 96.8°F | Resp 16 | Wt 161.3 lb

## 2019-02-11 DIAGNOSIS — N183 Chronic kidney disease, stage 3 unspecified: Secondary | ICD-10-CM

## 2019-02-11 DIAGNOSIS — D631 Anemia in chronic kidney disease: Secondary | ICD-10-CM

## 2019-02-11 DIAGNOSIS — I2510 Atherosclerotic heart disease of native coronary artery without angina pectoris: Secondary | ICD-10-CM | POA: Diagnosis not present

## 2019-02-11 DIAGNOSIS — Z79899 Other long term (current) drug therapy: Secondary | ICD-10-CM | POA: Diagnosis not present

## 2019-02-11 DIAGNOSIS — Z86711 Personal history of pulmonary embolism: Secondary | ICD-10-CM | POA: Diagnosis not present

## 2019-02-11 DIAGNOSIS — R5383 Other fatigue: Secondary | ICD-10-CM | POA: Insufficient documentation

## 2019-02-11 DIAGNOSIS — Z794 Long term (current) use of insulin: Secondary | ICD-10-CM | POA: Insufficient documentation

## 2019-02-11 DIAGNOSIS — E785 Hyperlipidemia, unspecified: Secondary | ICD-10-CM | POA: Insufficient documentation

## 2019-02-11 DIAGNOSIS — D696 Thrombocytopenia, unspecified: Secondary | ICD-10-CM | POA: Insufficient documentation

## 2019-02-11 DIAGNOSIS — R531 Weakness: Secondary | ICD-10-CM | POA: Insufficient documentation

## 2019-02-11 DIAGNOSIS — Z7982 Long term (current) use of aspirin: Secondary | ICD-10-CM | POA: Diagnosis not present

## 2019-02-11 DIAGNOSIS — E079 Disorder of thyroid, unspecified: Secondary | ICD-10-CM | POA: Insufficient documentation

## 2019-02-11 DIAGNOSIS — K219 Gastro-esophageal reflux disease without esophagitis: Secondary | ICD-10-CM | POA: Diagnosis not present

## 2019-02-11 DIAGNOSIS — Z87891 Personal history of nicotine dependence: Secondary | ICD-10-CM | POA: Insufficient documentation

## 2019-02-11 DIAGNOSIS — I129 Hypertensive chronic kidney disease with stage 1 through stage 4 chronic kidney disease, or unspecified chronic kidney disease: Secondary | ICD-10-CM | POA: Insufficient documentation

## 2019-02-11 DIAGNOSIS — N1832 Chronic kidney disease, stage 3b: Secondary | ICD-10-CM

## 2019-02-11 LAB — RETIC PANEL
Immature Retic Fract: 14.2 % (ref 2.3–15.9)
RBC.: 2.78 MIL/uL — ABNORMAL LOW (ref 4.22–5.81)
Retic Count, Absolute: 53.7 10*3/uL (ref 19.0–186.0)
Retic Ct Pct: 1.9 % (ref 0.4–3.1)
Reticulocyte Hemoglobin: 36 pg (ref >27.9)

## 2019-02-11 LAB — HEMOGLOBIN AND HEMATOCRIT, BLOOD
HCT: 28.6 % — ABNORMAL LOW (ref 39.0–52.0)
Hemoglobin: 9.2 g/dL — ABNORMAL LOW (ref 13.0–17.0)

## 2019-02-11 MED ORDER — EPOETIN ALFA-EPBX 10000 UNIT/ML IJ SOLN: 20000.0000 [IU] | Freq: Once | INTRAMUSCULAR | Status: DC

## 2019-02-11 MED ORDER — EPOETIN ALFA-EPBX 10000 UNIT/ML IJ SOLN
20000.0000 [IU] | Freq: Once | INTRAMUSCULAR | Status: AC
  Administered 2019-02-11: 20000 [IU] via SUBCUTANEOUS
  Filled 2019-02-11: qty 2

## 2019-02-11 NOTE — Progress Notes (Signed)
Patient does not offer any problems today.  

## 2019-02-12 NOTE — Progress Notes (Signed)
Hematology/Oncology follow up note [facility] [phone] [phone]   Patient Care Team: Pleas Koch, NP as PCP - General (Internal Medicine) Estill Cotta, MD as Referring Physician (Ophthalmology) End, Harrell Gave, MD as Consulting Physician (Cardiology)  REFERRING PROVIDER: Dr.Lateef REASON FOR VISIT Follow up for treatment of anemia of CKD, on erythropoietin replacement therapy.  HISTORY OF PRESENTING ILLNESS:  Tommy [name] is a  85 y.o.  male with PMH listed below who was referred to me for evaluation of anemia.  Patient recently removed from [place] to [place] to live with his son.  He has chronic kidney disease and anemia associated with CKD.  He reports that he has been getting Procrit 10,000 units every 2 weeks for many years.  Denies any recent thrombosis events.  Patient reports that at the last Procrit shot he received was back in September 2018 before he moved here. He establish care with Dr. Holley Raring for his CKD and he was referred to me for evaluation of anemia and possible Procrit shots.  Patient had lab work done at [facility] on 05/15/2017 hemoglobin 9.6, MCV 98, WBC 4.5, platelet count 1 70,000, normal differential use.   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 He has IVC filter in place.   INTERVAL HISTORY Tommy [name] is a 85 y.o. male who has above history reviewed by me today presents for follow-up for anemia due to CKD. Patient has been on erythropoietin therapy monthly if hemoglobin is less than 10. Patient reports feeling well. He denies any shortness of breath, chest pain, lower extremity swelling or abdominal pain. Chronic fatigue has not changed, at baseline.  Not worse. No new complaints.     Review of Systems  Constitutional: Positive for malaise/fatigue. Negative for chills, fever and weight loss.    HENT: Negative for sore throat.   Eyes: Negative for redness.  Respiratory: Negative for cough, shortness of breath and wheezing.   Cardiovascular: Negative for chest pain, palpitations and leg swelling.  Gastrointestinal: Negative for abdominal pain, blood in stool, nausea and vomiting.  Genitourinary: Negative for dysuria.  Musculoskeletal: Negative for myalgias.  Skin: Negative for rash.  Neurological: Negative for dizziness, tingling and tremors.  Endo/Heme/Allergies: Does not bruise/bleed easily.  Psychiatric/Behavioral: Negative for hallucinations.    MEDICAL HISTORY:  Past Medical History:  Diagnosis Date  . Anemia   . Carotid artery occlusion    LEFT  . CKD (chronic kidney disease) stage 3, GFR 30-59 ml/min   . Coronary artery disease   . Diabetes mellitus without complication ([place])   . Dyslipidemia   . GERD (gastroesophageal reflux disease)   . Hemorrhoids   . 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 Hypertension   . Pulmonary emboli ([place])   . Pulmonary embolism ([place])   . Thyroid disease     SURGICAL HISTORY: Past Surgical History:  Procedure Laterality Date  . CAROTID ENDARTERECTOMY Left 2004  . CORONARY ARTERY BYPASS GRAFT  1994   [name]  . TONSILLECTOMY AND ADENOIDECTOMY      SOCIAL HISTORY: Social History   Socioeconomic History  . Marital status: Widowed    Spouse name: Not on file  . Number of children: 4  . Years of education: 16  . Highest education level: Bachelor's degree (e.g., BA, AB, BS)  Occupational History  . Occupation: retired  Tobacco Use  . Smoking status: Former Smoker  Packs/day: 1.50    Years: 25.00    Pack years: 37.50    Types: Cigarettes    Quit date: 1    Years since quitting: 42.9  . Smokeless tobacco: Never Used  Substance and Sexual Activity  . Alcohol use: Yes    Alcohol/week: 1.0 standard drinks    Types: 1 Glasses of wine per week    Comment: socially   . Drug use: No  . Sexual activity:  Not Currently  Other Topics Concern  . Not on file  Social History Narrative   Widower.   4 children, 7 grandchildren, 1 great grandchild.   Retired. Once worked as an account.    Enjoys exercising, spending time with family.    Social Determinants of Health   Financial Resource Strain:   . Difficulty of Paying Living Expenses: Not on file  Food Insecurity:   . Worried About Charity fundraiser in the Last Year: Not on file  . Ran Out of Food in the Last Year: Not on file  Transportation Needs:   . Lack of Transportation (Medical): Not on file  . Lack of Transportation (Non-Medical): Not on file  Physical Activity:   . Days of Exercise per Week: Not on file  . Minutes of Exercise per Session: Not on file  Stress:   . Feeling of Stress : Not on file  Social Connections:   . Frequency of Communication with Friends and Family: Not on file  . Frequency of Social Gatherings with Friends and Family: Not on file  . Attends Religious Services: Not on file  . Active Member of Clubs or Organizations: Not on file  . Attends Archivist Meetings: Not on file  . Marital Status: Not on file  Intimate Partner Violence:   . Fear of Current or Ex-Partner: Not on file  . Emotionally Abused: Not on file  . Physically Abused: Not on file  . Sexually Abused: Not on file    FAMILY HISTORY: Family History  Problem Relation Age of Onset  . Heart attack Mother   . Heart disease Mother   . Stroke Mother   . Diabetes Father   . Coronary artery disease Sister     ALLERGIES:  is allergic to heparin.  MEDICATIONS:  Current Outpatient Medications  Medication Sig Dispense Refill  . aspirin 81 MG tablet Take 81 mg by mouth every evening.     Marland Kitchen atorvastatin (LIPITOR) 40 MG tablet TAKE 1 TABLET BY MOUTH  DAILY 90 tablet 1  . betamethasone valerate (VALISONE) 0.1 % cream APPLY TO AFFECTED AREA TWICE A DAY 30 g 0  . Blood Glucose Monitoring Suppl (ONETOUCH VERIO) w/Device KIT 1 Device by  Does not apply route daily. Use as instructed to test blood sugar daily 1 kit 0  . CALCIUM PO Take 600 mg by mouth every morning.     . carvedilol (COREG) 6.25 MG tablet TAKE 1 TABLET BY MOUTH  TWICE DAILY WITH MEALS 180 tablet 1  . cholecalciferol (VITAMIN D3) 25 MCG (1000 UT) tablet Take 1,000 Units by mouth daily.    Marland Kitchen epoetin alfa (EPOGEN,PROCRIT) 25427 UNIT/ML injection 10,000 Units. Every 2 to 4 weeks    . furosemide (LASIX) 40 MG tablet TAKE 1 TABLET BY MOUTH  DAILY 90 tablet 1  . glimepiride (AMARYL) 4 MG tablet TAKE 1 TABLET BY MOUTH  DAILY WITH BREAKFAST 90 tablet 1  . Hydrocortisone (PROCTOSOL HC RE) Place rectally as needed.    Marland Kitchen  Insulin Glargine (LANTUS) 100 UNIT/ML Solostar Pen Inject 8 Units into the skin at bedtime. 15 mL 5  . Insulin Pen Needle (B-D ULTRAFINE III SHORT PEN) 31G X 8 MM MISC USE AS INSTRUCTED TO INJECT INSULIN DAILY. 100 each 5  . Insulin Pen Needle 29G X 12.7MM MISC BD Ultrafine Pen Needle Use as instructed to inject insulin daily 300 each 1  . IRON PO Take by mouth 4 (four) times daily.     Marland Kitchen levothyroxine (SYNTHROID) 175 MCG tablet TAKE 1 TABLET BY MOUTH  EVERY MORNING ON AN EMPTY  STOMACH WITH A FULL GLASS  OF WATER. 90 tablet 1  . losartan (COZAAR) 50 MG tablet Take 1 tablet (50 mg total) by mouth daily. 90 tablet 3  . Multiple Vitamin (MULTIVITAMIN) capsule Take 1 capsule by mouth daily.    . Omega-3 Fatty Acids (FISH OIL PO) Take 1,200 mg by mouth 4 (four) times daily.    Glory Rosebush DELICA LANCETS 50Y MISC USE AS INSTRUCT TO TEST BLOOD SUGAR ONCE DAILY 100 each 4  . ONETOUCH VERIO test strip USE AS INSTRUCTED TO TEST BLOOD SUGAR ONCE DAILY 100 each 5  . pantoprazole (PROTONIX) 20 MG tablet TAKE 1 TABLET BY MOUTH  DAILY 90 tablet 1  . ranolazine (RANEXA) 500 MG 12 hr tablet TAKE 1 TABLET BY MOUTH  TWICE DAILY 180 tablet 3  . timolol (TIMOPTIC) 0.5 % ophthalmic solution APPLY 1 DROP(S) IN BOTH EYES ONCE A DAY  11  . nitroGLYCERIN (NITROSTAT) 0.4 MG SL tablet  Place 1 tablet (0.4 mg total) under the tongue every 5 (five) minutes as needed for chest pain. 25 tablet 3   No current facility-administered medications for this visit.     PHYSICAL EXAMINATION: ECOG PERFORMANCE STATUS: 1 - Symptomatic but completely ambulatory Vitals:   02/11/19 0954  BP: (!) 152/69  Pulse: (!) 53  Resp: 16  Temp: (!) 96.8 F (36 C)   Filed Weights   02/11/19 0954  Weight: 161 lb 4.8 oz (73.2 kg)    Physical Exam  Constitutional: He is oriented to person, place, and time. No distress.  Thin, walk in.   HENT:  Head: Normocephalic and atraumatic.  Nose: Nose normal.  Mouth/Throat: Oropharynx is clear and moist. No oropharyngeal exudate.  Eyes: Pupils are equal, round, and reactive to light. EOM are normal. Right eye exhibits no discharge. Left eye exhibits no discharge. No scleral icterus.  Neck: No JVD present.  Cardiovascular: Normal rate, regular rhythm and normal heart sounds.  No murmur heard. Pulmonary/Chest: Effort normal. No respiratory distress. He has no rales. He exhibits no tenderness.  Abdominal: Soft. Bowel sounds are normal. He exhibits no distension and no mass. There is no abdominal tenderness. There is no rebound.  Musculoskeletal:        General: No tenderness or edema. Normal range of motion.     Cervical back: Normal range of motion and neck supple.  Lymphadenopathy:    He has no cervical adenopathy.  Neurological: He is alert and oriented to person, place, and time. No cranial nerve deficit. He exhibits normal muscle tone. Coordination normal.  Skin: Skin is warm and dry. No rash noted. He is not diaphoretic. No erythema.  Psychiatric: Affect and judgment normal.     LABORATORY DATA:  I have reviewed the data as listed Lab Results  Component Value Date   WBC 5.8 10/26/2018   HGB 9.2 (L) 02/11/2019   HCT 28.6 (L) 02/11/2019   MCV 101.8 (  H) 10/26/2018   PLT 128 (L) 10/26/2018   Recent Labs    04/06/18 1237  NA 137  K 4.8   CL 103  CO2 28  GLUCOSE 263*  BUN 35*  CREATININE 1.53*  CALCIUM 9.5  PROT 7.1  ALBUMIN 4.0  AST 19  ALT 18  ALKPHOS 71  BILITOT 0.4     Iron/TIBC/Ferritin/ %Sat    Component Value Date/Time   IRON 109 05/23/2017 1153   TIBC 311 05/23/2017 1153   FERRITIN 670 (H) 05/23/2017 1153   IRONPCTSAT 35 05/23/2017 1153   SPEP negative for M spike.   RADIOGRAPHIC STUDIES: I have personally reviewed the radiological images as listed and agreed with the findings in the report. 01/19/2018 CXR FINDINGS: The lungs are clear and negative for focal airspace consolidation, pulmonary edema or suspicious pulmonary nodule. No pleural effusion or pneumothorax. Cardiac and mediastinal contours are within normal limits. Patient is status post median sternotomy with evidence of prior CABG including LIMA bypass. Trace linear scarring versus atelectasis in the lingula. No acute fracture or lytic or blastic osseous lesions. The visualized upper abdominal bowel gas pattern is unremarkable.  IMPRESSION: No active cardiopulmonary disease. ASSESSMENT & PLAN:  1. Anemia of chronic renal failure, stage 3b   2. Thrombocytopenia ([place])    #Labs reviewed and discussed with patient. Hemoglobin is below 10.  Proceed with Retacrit 20,000 today. Continue H&H every 4 weeks and proceed with Retacrit 20,000 if hemoglobin is less than 10.   #Chronic thrombocytopenia,  Previous work-up showed normal B12, folate, TSH.  Onset of thrombocytopenia in June 2019.  Questionable MDS.  Continue to monitor  All questions were answered. The patient knows to call the clinic with any problems questions or concerns. Orders Placed This Encounter  Procedures  . Hemoglobin [facility])    Standing Status:   Future    Standing Expiration Date:   02/11/2020  . Hematocrit ([facility])    Standing Status:   Future    Standing Expiration Date:   02/11/2020  . Hemoglobin [facility])    Standing Status:   Future    Standing Expiration Date:    02/11/2020  . Hematocrit ([facility])    Standing Status:   Future    Standing Expiration Date:   02/11/2020  . CBC with Differential    Standing Status:   Future    Standing Expiration Date:   02/11/2020  . Retic Panel    Standing Status:   Future    Number of Occurrences:   1    Standing Expiration Date:   02/11/2020    Return of visit:  Monthly H&H +/- procrit. Follow up in clinic in 3 months.     Earlie Server, MD, PhD  02/12/2019

## 2019-02-18 ENCOUNTER — Telehealth: Payer: Self-pay | Admitting: Internal Medicine

## 2019-02-18 NOTE — Telephone Encounter (Signed)
Patient calling  Patient sent a couple mychart messages, wanted to check on status  Patient scheduled an appointment to see Angelica Ran tomorrow 12/18 Please call to discuss

## 2019-02-18 NOTE — Telephone Encounter (Signed)
Spoke with patient. He had episode of chest pain 4 times last night. They were spaced out about 1-2 hours apart between midnight and 0700. He took a Nitro each time and the pain eased but then came back. Last episode was a little before 7 am. Has not had chest pain since and not at this time. Denies any accompanying symptoms such as SOB, dizziness, nausea, vomiting or sweating.  Says in the past he may have one episode at night and take 1 Nitro and everything is fine. He is scheduled to see Ignacia Bayley, NP tomorrow morning. Pt verbalized understanding to go to the emergency room or call 911 if the chest pain recurs and does not go away with the Nitro or if he develops other symptoms, such as SOB, nausea, vomiting, or sweating.

## 2019-02-19 ENCOUNTER — Ambulatory Visit (INDEPENDENT_AMBULATORY_CARE_PROVIDER_SITE_OTHER): Payer: Medicare Other | Admitting: Nurse Practitioner

## 2019-02-19 ENCOUNTER — Other Ambulatory Visit: Payer: Self-pay

## 2019-02-19 ENCOUNTER — Encounter: Payer: Self-pay | Admitting: Nurse Practitioner

## 2019-02-19 VITALS — BP 152/60 | HR 60 | Ht 70.0 in | Wt 159.5 lb

## 2019-02-19 DIAGNOSIS — I440 Atrioventricular block, first degree: Secondary | ICD-10-CM

## 2019-02-19 DIAGNOSIS — I25118 Atherosclerotic heart disease of native coronary artery with other forms of angina pectoris: Secondary | ICD-10-CM | POA: Diagnosis not present

## 2019-02-19 DIAGNOSIS — E785 Hyperlipidemia, unspecified: Secondary | ICD-10-CM | POA: Diagnosis not present

## 2019-02-19 DIAGNOSIS — I10 Essential (primary) hypertension: Secondary | ICD-10-CM

## 2019-02-19 DIAGNOSIS — I6523 Occlusion and stenosis of bilateral carotid arteries: Secondary | ICD-10-CM | POA: Diagnosis not present

## 2019-02-19 DIAGNOSIS — N183 Chronic kidney disease, stage 3 unspecified: Secondary | ICD-10-CM

## 2019-02-19 MED ORDER — RANOLAZINE ER 1000 MG PO TB12: 1000.0000 mg | ORAL_TABLET | Freq: Two times a day (BID) | ORAL | 3 refills | Status: DC

## 2019-02-19 MED ORDER — NITROGLYCERIN 0.4 MG SL SUBL: 0.4000 mg | SUBLINGUAL_TABLET | SUBLINGUAL | 3 refills | Status: DC | PRN

## 2019-02-19 NOTE — Addendum Note (Signed)
Addended by: Verlon Au on: 02/19/2019 01:59 PM   Modules accepted: Orders

## 2019-02-19 NOTE — Patient Instructions (Signed)
Medication Instructions:  1- INCREASE Ranexa Take 1 tablet (1,000 mg total) by mouth 2 (two) times daily *If you need a refill on your cardiac medications before your next appointment, please call your pharmacy*  Lab Work: None ordered  If you have labs (blood work) drawn today and your tests are completely normal, you will receive your results only by: Marland Kitchen MyChart Message (if you have MyChart) OR . A paper copy in the mail If you have any lab test that is abnormal or we need to change your treatment, we will call you to review the results.  Testing/Procedures: None ordered   Follow-Up: At [facility], you and your health needs are our priority.  As part of our continuing mission to provide you with exceptional heart care, we have created designated Provider Care Teams.  These Care Teams include your primary Cardiologist (physician) and Advanced Practice Providers (APPs -  Physician Assistants and Nurse Practitioners) who all work together to provide you with the care you need, when you need it.  Your next appointment:   1 month(s)  The format for your next appointment:   In Person  Provider:    You may see Dr. Saunders Revel or Murray Hodgkins, NP.

## 2019-02-19 NOTE — Progress Notes (Addendum)
Office Visit    Patient Name: Tommy [name] Date of Encounter: 02/19/2019  Primary Care Provider:  Pleas Koch, NP Primary Cardiologist:  Nelva Bush, MD  Chief Complaint    85 year old male with a history of CAD status post remote coronary artery bypass grafting in 1994, chronic stable angina, peripheral arterial disease, carotid artery disease status post left carotid enterectomy, hypertension, hyperlipidemia, type 2 diabetes mellitus, stage III chronic kidney disease, and anemia, who presents for follow-up related to nocturnal angina.  Past Medical History    Past Medical History:  Diagnosis Date  . Anemia   . Carotid artery occlusion    a. 2004 s/p L carotid endarterectomy; b. 02/2019 U/S: 1-39% bilat ICA stenosis. <50% bilat CCA stenosis.  . CKD (chronic kidney disease) stage 3, GFR 30-59 ml/min   . Coronary artery disease    a. 1994 s/p CABG x 3, [place], [place] (LIMA->LAD, VG->dRCA, VG->unknown vessel); b. 02/2016 Cath: LM 99d, LAD 100ost, LCX 100ost, RCA 80ost, diffuse 85-61m LIMA->LAD ok, VG->RCA ok. VG->unknown vessel 100.  . Diabetes mellitus without complication ([facility]   . Dyslipidemia   . GERD (gastroesophageal reflux disease)   . Hemorrhoids   . 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 History of echocardiogram    a. 02/2017 Echo: EF 55-65%, no rwma. Nl DD. Ao sclerosis w/o stenosis. Mod TR. Mild MR.  .Marland KitchenHypertension   . Pulmonary embolism ([facility]   . Thyroid disease    Past Surgical History:  Procedure Laterality Date  . CAROTID ENDARTERECTOMY Left 2004  . CORONARY ARTERY BYPASS GRAFT  1994   [name]  . TONSILLECTOMY AND ADENOIDECTOMY      Allergies  Allergies  Allergen Reactions  . Heparin     History of Present Illness    85year old male with the above past medical history including coronary artery disease [place]. His most recent catheterization was performed  in December 2017 showing native severe multivessel disease with 2 of 3 patent grafts. He has had chronic stable angina, typically occurring at night, which has been controlled with beta-blocker and ranolazine therapy. Other history includes peripheral arterial disease, carotid arterial disease status post left carotid endarterectomy, hypertension, hyperlipidemia, type 2 diabetes mellitus, stage III chronic kidney disease, and anemia.  Mr. PMetzgarwas last seen in clinic in October, at which time he reported some reduction in nocturnal angina. That said, 1 day later, he had an episode which was nitrate responsive. Interestingly, he exercises several days per week, performing both weightlifting and walking on a treadmill and does not typically experience exertional angina. He did exercise this past Wednesday and tolerated it well, but did note mild chest pressure at one point, which resolved on its own. He awoke at some point after midnight on Thursday morning with recurrent angina and he took a sublingual nitroglycerin with relief. He had recurrent symptoms a few hours later which again resolved with nitrates. He had 2 additional episodes which resolved with nitrates. He had no angina throughout the day yesterday but because of nocturnal symptoms the other night, he called and got this appointment today. He has not had any recurrent chest discomfort this morning and was able to walk from the parking lot all the way into clinic without any symptoms or limitations. He now says he feels fine. Of note, he had previously been tried on nitrates but did not tolerate secondary to headaches. He had previous been tried on amlodipine  but developed lower extremity swelling. He denies PND, orthopnea, dizziness, syncope, edema, or early satiety.  Home Medications    Prior to Admission medications   Medication Sig Start Date End Date Taking? Authorizing Provider  aspirin 81 MG tablet Take 81 mg by mouth every evening.    Yes  [provider]  atorvastatin (LIPITOR) 40 MG tablet TAKE 1 TABLET BY MOUTH  DAILY 11/23/18  Yes Pleas Koch, NP  betamethasone valerate (VALISONE) 0.1 % cream APPLY TO AFFECTED AREA TWICE A DAY 02/05/19  Yes Pleas Koch, NP  Blood Glucose Monitoring Suppl (ONETOUCH VERIO) w/Device KIT 1 Device by Does not apply route daily. Use as instructed to test blood sugar daily 04/22/17  Yes Pleas Koch, NP  CALCIUM PO Take 600 mg by mouth every morning.    Yes [provider]  carvedilol (COREG) 6.25 MG tablet TAKE 1 TABLET BY MOUTH  TWICE DAILY WITH MEALS 12/15/18  Yes Pleas Koch, NP  cholecalciferol (VITAMIN D3) 25 MCG (1000 UT) tablet Take 1,000 Units by mouth daily.   Yes [provider]  epoetin alfa (EPOGEN,PROCRIT) 47096 UNIT/ML injection 10,000 Units. Every 2 to 4 weeks   Yes [provider]  furosemide (LASIX) 40 MG tablet TAKE 1 TABLET BY MOUTH  DAILY 01/21/19  Yes Pleas Koch, NP  glimepiride (AMARYL) 4 MG tablet TAKE 1 TABLET BY MOUTH  DAILY WITH BREAKFAST 11/17/18  Yes Pleas Koch, NP  Hydrocortisone (PROCTOSOL HC RE) Place rectally as needed.   Yes [provider]  Insulin Pen Needle (B-D ULTRAFINE III SHORT PEN) 31G X 8 MM MISC USE AS INSTRUCTED TO INJECT INSULIN DAILY. 01/13/19  Yes Pleas Koch, NP  Insulin Pen Needle 29G X 12.7MM MISC BD Ultrafine Pen Needle Use as instructed to inject insulin daily 02/18/17  Yes Pleas Koch, NP  IRON PO Take by mouth 4 (four) times daily.    Yes [provider]  levothyroxine (SYNTHROID) 175 MCG tablet TAKE 1 TABLET BY MOUTH  EVERY MORNING ON AN EMPTY  STOMACH WITH A FULL GLASS  OF WATER. 10/27/18  Yes Pleas Koch, NP  losartan (COZAAR) 50 MG tablet Take 1 tablet (50 mg total) by mouth daily. 02/08/19 05/09/19 Yes End, Harrell Gave, MD  Multiple Vitamin (MULTIVITAMIN) capsule Take 1 capsule by mouth daily.   Yes [provider]    nitroGLYCERIN (NITROSTAT) 0.4 MG SL tablet Place 1 tablet (0.4 mg total) under the tongue every 5 (five) minutes as needed for chest pain. 06/18/18 02/19/19 Yes End, Harrell Gave, MD  Omega-3 Fatty Acids (FISH OIL PO) Take 1,200 mg by mouth 4 (four) times daily.   Yes [provider]  Jonetta Speak LANCETS 28Z MISC USE AS INSTRUCT TO TEST BLOOD SUGAR ONCE DAILY 04/01/18  Yes Pleas Koch, NP  [facility] VERIO test strip USE AS INSTRUCTED TO TEST BLOOD SUGAR ONCE DAILY 06/09/18  Yes Pleas Koch, NP  pantoprazole (PROTONIX) 20 MG tablet TAKE 1 TABLET BY MOUTH  DAILY 10/27/18  Yes Pleas Koch, NP  ranolazine (RANEXA) 500 MG 12 hr tablet TAKE 1 TABLET BY MOUTH  TWICE DAILY 10/26/18  Yes End, Harrell Gave, MD  timolol (TIMOPTIC) 0.5 % ophthalmic solution APPLY 1 DROP(S) IN BOTH EYES ONCE A DAY 03/17/17  Yes [provider]    Review of Systems    Nocturnal nitrate responsive angina as outlined above. He denies dyspnea, palpitations, PND, orthopnea, dizziness, syncope, edema, or early satiety.  All other systems reviewed and are otherwise negative except as noted above.  Physical Exam    VS:  BP (!) 152/60 (BP Location: Left Arm, Patient Position: Sitting, Cuff Size: Normal)   Pulse 60   Ht '5\' 10"'$  (1.778 m)   Wt 159 lb 8 oz (72.3 kg)   SpO2 99%   BMI 22.89 kg/m  , BMI Body mass index is 22.89 kg/m. GEN: Well nourished, well developed, in no acute distress. HEENT: normal. Neck: Supple, no JVD, carotid bruits, or masses. Cardiac: RRR, 1/6 systolic murmur heard throughout, no rubs, or gallops. No clubbing, cyanosis, edema.  Radials/PT 2+ and equal bilaterally.  Respiratory:  Respirations regular and unlabored, clear to auscultation bilaterally. GI: Soft, nontender, nondistended, BS + x 4. MS: no deformity or atrophy. Skin: warm and dry, no rash. Neuro:  Strength and sensation are intact. Psych: Normal affect.  Accessory Clinical Findings    ECG personally  reviewed by me today -regular sinus rhythm, 60, first-degree AV block, right bundle branch block - no acute changes.  Lab Results  Component Value Date   WBC 5.8 10/26/2018   HGB 9.2 (L) 02/11/2019   HCT 28.6 (L) 02/11/2019   MCV 101.8 (H) 10/26/2018   PLT 128 (L) 10/26/2018   Lab Results  Component Value Date   CREATININE 1.53 (H) 04/06/2018   BUN 35 (H) 04/06/2018   NA 137 04/06/2018   K 4.8 04/06/2018   CL 103 04/06/2018   CO2 28 04/06/2018   Lab Results  Component Value Date   ALT 18 04/06/2018   AST 19 04/06/2018   ALKPHOS 71 04/06/2018   BILITOT 0.4 04/06/2018   Lab Results  Component Value Date   CHOL 125 04/06/2018   HDL 35.60 (L) 04/06/2018   LDLCALC 61 04/06/2018   TRIG 143.0 04/06/2018   CHOLHDL 4 04/06/2018    Lab Results  Component Value Date   HGBA1C 6.1 (A) 10/13/2018    Assessment & Plan    1. Coronary artery disease/chronic nocturnal angina: Patient with a history of CAD and remote bypass x3 in 1994 with known 2 of 3 patent grafts by catheterization in 2017 and otherwise severe native vessel disease. He has experienced intermittent nocturnal angina over several years, which is nitrate responsive. He has previously not tolerated long-acting nitrates secondary headaches, or amlodipine therapy secondary to lower extremity swelling. 2 nights ago, he awoke on 4 separate occasions with substernal chest discomfort that resolved after 1 nitroglycerin. He did not have any recurrent symptoms with usual activity throughout the day yesterday or this morning. In the past, he has been able to exercise without activity limiting angina or dyspnea despite occasional nocturnal symptoms. We discussed options for management today. I am going to increase his ranolazine to 1000 mg twice daily. I do note prolonged QT however, this is in the setting of a right bundle branch block. He also has CKD 3 but has tolerated ranexa rx thus far. He is aware that if he has recurrent or  progressive symptoms, that we would likely plan to pursue diagnostic catheterization. He otherwise remains on aspirin, statin, beta-blocker, and ARB therapy. Follow-up in approximately 2 to 4 weeks to reevaluate symptoms, ECG, and basic metabolic panel given known stage III chronic kidney disease.  2. Essential hypertension: Blood pressure elevated today however, he says he typically runs in the 120s at home. He does check this regularly. I recommend he continue to check his blood pressure at home and contact us if  pressures are consistently greater than 478 systolic.  3. Hyperlipidemia: LDL of 61 in February with normal LFTs at that time. Continue statin therapy.  4. First-degree AV block: PR interval 238 ms today which is slightly less than on prior ECG in October.  5. Carotid arterial disease: Status post left CEA. Stable disease on recent carotid ultrasound-less than 40% bilateral ICA stenosis with less than 50% bilateral CCA stenosis. Continue aspirin and statin therapy.  6. Stage III chronic kidney disease: Creatinine of 1.53 in February. We will plan to follow-up basic metabolic panel at next visit given adjustment of ranolazine.  7. DMII:  A1c 6.1 in August.  Remains on lantus and amaryl.  8.  Disposition: Follow-up in 2 to 4 weeks or sooner if necessary. Murray Hodgkins, NP 02/19/2019, 11:58 AM

## 2019-02-23 ENCOUNTER — Other Ambulatory Visit: Payer: Self-pay | Admitting: Primary Care

## 2019-02-23 DIAGNOSIS — E039 Hypothyroidism, unspecified: Secondary | ICD-10-CM

## 2019-03-11 ENCOUNTER — Inpatient Hospital Stay: Payer: Medicare Other | Attending: Oncology

## 2019-03-11 ENCOUNTER — Inpatient Hospital Stay: Payer: Medicare Other

## 2019-03-11 ENCOUNTER — Other Ambulatory Visit: Payer: Self-pay

## 2019-03-11 VITALS — BP 154/76 | HR 55

## 2019-03-11 DIAGNOSIS — N183 Chronic kidney disease, stage 3 unspecified: Secondary | ICD-10-CM

## 2019-03-11 DIAGNOSIS — I129 Hypertensive chronic kidney disease with stage 1 through stage 4 chronic kidney disease, or unspecified chronic kidney disease: Secondary | ICD-10-CM | POA: Insufficient documentation

## 2019-03-11 DIAGNOSIS — N1832 Chronic kidney disease, stage 3b: Secondary | ICD-10-CM

## 2019-03-11 DIAGNOSIS — Z79899 Other long term (current) drug therapy: Secondary | ICD-10-CM | POA: Diagnosis not present

## 2019-03-11 DIAGNOSIS — D631 Anemia in chronic kidney disease: Secondary | ICD-10-CM | POA: Insufficient documentation

## 2019-03-11 LAB — HEMATOCRIT: HCT: 29.7 % — ABNORMAL LOW (ref 39.0–52.0)

## 2019-03-11 LAB — HEMOGLOBIN: Hemoglobin: 9.3 g/dL — ABNORMAL LOW (ref 13.0–17.0)

## 2019-03-11 MED ORDER — EPOETIN ALFA-EPBX 10000 UNIT/ML IJ SOLN
20000.0000 [IU] | Freq: Once | INTRAMUSCULAR | Status: AC
  Administered 2019-03-11: 20000 [IU] via SUBCUTANEOUS
  Filled 2019-03-11: qty 2

## 2019-03-15 DIAGNOSIS — H40053 Ocular hypertension, bilateral: Secondary | ICD-10-CM | POA: Diagnosis not present

## 2019-03-18 DIAGNOSIS — N2581 Secondary hyperparathyroidism of renal origin: Secondary | ICD-10-CM | POA: Diagnosis not present

## 2019-03-18 DIAGNOSIS — N1832 Chronic kidney disease, stage 3b: Secondary | ICD-10-CM | POA: Diagnosis not present

## 2019-03-18 DIAGNOSIS — I10 Essential (primary) hypertension: Secondary | ICD-10-CM | POA: Diagnosis not present

## 2019-03-18 DIAGNOSIS — D631 Anemia in chronic kidney disease: Secondary | ICD-10-CM | POA: Diagnosis not present

## 2019-03-18 DIAGNOSIS — E875 Hyperkalemia: Secondary | ICD-10-CM | POA: Diagnosis not present

## 2019-03-24 ENCOUNTER — Other Ambulatory Visit: Payer: Self-pay

## 2019-03-24 ENCOUNTER — Encounter: Payer: Self-pay | Admitting: Nurse Practitioner

## 2019-03-24 ENCOUNTER — Ambulatory Visit (INDEPENDENT_AMBULATORY_CARE_PROVIDER_SITE_OTHER): Payer: Medicare Other | Admitting: Nurse Practitioner

## 2019-03-24 VITALS — BP 138/52 | HR 57 | Ht 70.0 in | Wt 160.8 lb

## 2019-03-24 DIAGNOSIS — I25118 Atherosclerotic heart disease of native coronary artery with other forms of angina pectoris: Secondary | ICD-10-CM | POA: Diagnosis not present

## 2019-03-24 DIAGNOSIS — E785 Hyperlipidemia, unspecified: Secondary | ICD-10-CM | POA: Diagnosis not present

## 2019-03-24 DIAGNOSIS — I779 Disorder of arteries and arterioles, unspecified: Secondary | ICD-10-CM | POA: Diagnosis not present

## 2019-03-24 DIAGNOSIS — I10 Essential (primary) hypertension: Secondary | ICD-10-CM | POA: Diagnosis not present

## 2019-03-24 DIAGNOSIS — R42 Dizziness and giddiness: Secondary | ICD-10-CM | POA: Diagnosis not present

## 2019-03-24 DIAGNOSIS — N183 Chronic kidney disease, stage 3 unspecified: Secondary | ICD-10-CM

## 2019-03-24 DIAGNOSIS — R001 Bradycardia, unspecified: Secondary | ICD-10-CM

## 2019-03-24 NOTE — Patient Instructions (Signed)
Medication Instructions:  No medication changes. *If you need a refill on your cardiac medications before your next appointment, please call your pharmacy*  Lab Work: You had a BMET today in the office. If you have labs (blood work) drawn today and your tests are completely normal, you will receive your results only by: Marland Kitchen MyChart Message (if you have MyChart) OR . A paper copy in the mail If you have any lab test that is abnormal or we need to change your treatment, we will call you to review the results.  Testing/Procedures: None ordered.  Follow-Up: At [facility], you and your health needs are our priority.  As part of our continuing mission to provide you with exceptional heart care, we have created designated Provider Care Teams.  These Care Teams include your primary Cardiologist (physician) and Advanced Practice Providers (APPs -  Physician Assistants and Nurse Practitioners) who all work together to provide you with the care you need, when you need it.  Your next appointment:   3 month(s)  The format for your next appointment:   In Person  Provider:   Nelva Bush, MD  Other Instructions COVID-19 Vaccine Information can be found at: [email or web address] For questions related to vaccine distribution or appointments, please email [email or web address] or call [phone].

## 2019-03-24 NOTE — Progress Notes (Signed)
Office Visit    Patient Name: Tommy [name] Date of Encounter: 03/24/2019  Primary Care Provider:  Pleas Koch, NP Primary Cardiologist:  Nelva Bush, MD  Chief Complaint    86 y/o ? w/ a h/o CAD s/p remote CABG in 1994, chronic stable angina, PAD s/p L CEA, HTN, HL DMII, CKD III, and anemia, who presents for f/u related to nocturnal angina.  Past Medical History    Past Medical History:  Diagnosis Date  . Anemia   . Carotid artery occlusion    a. 2004 s/p L carotid endarterectomy; b. 02/2019 U/S: 1-39% bilat ICA stenosis. <50% bilat CCA stenosis.  . CKD (chronic kidney disease) stage 3, GFR 30-59 ml/min   . Coronary artery disease    a. 1994 s/p CABG x 3, [place] (LIMA->LAD, VG->dRCA, VG->unknown vessel); b. 02/2016 Cath: LM 99d, LAD 100ost, LCX 100ost, RCA 80ost, diffuse 85-67m LIMA->LAD ok, VG->RCA ok. VG->unknown vessel 100.  . Diabetes mellitus without complication ([place]   . Dyslipidemia   . GERD (gastroesophageal reflux disease)   . Hemorrhoids   . 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 History of echocardiogram    a. 02/2017 Echo: EF 55-65%, no rwma. Nl DD. Ao sclerosis w/o stenosis. Mod TR. Mild MR.  .Marland KitchenHypertension   . Pulmonary embolism ([facility]   . Thyroid disease    Past Surgical History:  Procedure Laterality Date  . CAROTID ENDARTERECTOMY Left 2004  . CORONARY ARTERY BYPASS GRAFT  1994   [name]  . TONSILLECTOMY AND ADENOIDECTOMY      Allergies  Allergies  Allergen Reactions  . Heparin Other (See Comments)  . Patiromer Other (See Comments)  . Sodium Zirconium Cyclosilicate Other (See Comments)    History of Present Illness    86y/o ? with the above past medical history including CAD [place]  His most recent catheterization was performed in December 2017, showing native, severe, multivessel disease with 2 of 3 patent grafts.  He has had chronic,  stable angina, typically occurring at night, which has been controlled with beta-blocker and ranolazine therapy.  Other history includes peripheral arterial disease, carotid arterial disease status post left carotid endarterectomy, hypertension, hyperlipidemia, type 2 diabetes mellitus, stage III chronic kidney disease, and anemia.  He was last seen in clinic on December 18 of 2020, at which time he reported multiple episodes of nitrate responsive nocturnal angina occurring 2 nights before.  He was not having exertional symptoms.  In the setting of prior intolerance to long-acting nitrates (headaches), along with intolerance to amlodipine secondary to lower extremity swelling, I increased his ranolazine to 1000 mg twice daily.  Since his last visit, he has had only two episodes of nocturnal angina on back-to-back nights, both relieved with one sublingual nitroglycerin.  He has not had any chest discomfort in 2 weeks.  On December 31, he did have some lightheadedness associated with blood pressures in the low 100s and heart rates in the 40s however, this has not recurred.  He continues to exercise on a regular basis, performing both weightlifting and walking on a treadmill and denies any exertional chest pain or dyspnea.  No PND, orthopnea, syncope, edema, or early satiety.  Home Medications    Prior to Admission medications   Medication Sig Start Date End Date Taking? Authorizing Provider  aspirin 81 MG tablet Take 81 mg by mouth every evening.     [provider]  atorvastatin (LIPITOR) 40 MG tablet TAKE 1 TABLET BY MOUTH  DAILY 11/23/18   Pleas Koch, NP  betamethasone valerate (VALISONE) 0.1 % cream APPLY TO AFFECTED AREA TWICE A DAY 02/05/19   Pleas Koch, NP  Blood Glucose Monitoring Suppl (ONETOUCH VERIO) w/Device KIT 1 Device by Does not apply route daily. Use as instructed to test blood sugar daily 04/22/17   Pleas Koch, NP  CALCIUM PO Take 600 mg by mouth every  morning.     [provider]  carvedilol (COREG) 6.25 MG tablet TAKE 1 TABLET BY MOUTH  TWICE DAILY WITH MEALS 12/15/18   Pleas Koch, NP  cholecalciferol (VITAMIN D3) 25 MCG (1000 UT) tablet Take 1,000 Units by mouth daily.    [provider]  epoetin alfa (EPOGEN,PROCRIT) 93903 UNIT/ML injection 10,000 Units. Every 2 to 4 weeks    [provider]  furosemide (LASIX) 40 MG tablet TAKE 1 TABLET BY MOUTH  DAILY 01/21/19   Pleas Koch, NP  glimepiride (AMARYL) 4 MG tablet TAKE 1 TABLET BY MOUTH  DAILY WITH BREAKFAST 02/24/19   Pleas Koch, NP  Hydrocortisone (PROCTOSOL HC RE) Place rectally as needed.    [provider]  Insulin Pen Needle (B-D ULTRAFINE III SHORT PEN) 31G X 8 MM MISC USE AS INSTRUCTED TO INJECT INSULIN DAILY. 01/13/19   Pleas Koch, NP  Insulin Pen Needle 29G X 12.7MM MISC BD Ultrafine Pen Needle Use as instructed to inject insulin daily 02/18/17   Pleas Koch, NP  IRON PO Take by mouth 4 (four) times daily.     [provider]  levothyroxine (SYNTHROID) 175 MCG tablet TAKE 1 TABLET BY MOUTH IN  THE MORNING ON AN EMPTY  STOMACH WITH A FULL GLASS  OF WATER 02/24/19   Pleas Koch, NP  losartan (COZAAR) 50 MG tablet Take 1 tablet (50 mg total) by mouth daily. 02/08/19 05/09/19  End, Harrell Gave, MD  Multiple Vitamin (MULTIVITAMIN) capsule Take 1 capsule by mouth daily.    [provider]  nitroGLYCERIN (NITROSTAT) 0.4 MG SL tablet Place 1 tablet (0.4 mg total) under the tongue every 5 (five) minutes as needed for chest pain. 02/19/19 05/20/19  Theora Gianotti, NP  Omega-3 Fatty Acids (FISH OIL PO) Take 1,200 mg by mouth 4 (four) times daily.    [provider]  [facility] DELICA LANCETS 00P MISC USE AS INSTRUCT TO TEST BLOOD SUGAR ONCE DAILY 04/01/18   Pleas Koch, NP  [facility] VERIO test strip USE AS INSTRUCTED TO TEST BLOOD SUGAR ONCE DAILY 06/09/18   Pleas Koch, NP    pantoprazole (PROTONIX) 20 MG tablet TAKE 1 TABLET BY MOUTH  DAILY 10/27/18   Pleas Koch, NP  ranolazine (RANEXA) 1000 MG SR tablet Take 1 tablet (1,000 mg total) by mouth 2 (two) times daily. 02/19/19   Theora Gianotti, NP  timolol (TIMOPTIC) 0.5 % ophthalmic solution APPLY 1 DROP(S) IN BOTH EYES ONCE A DAY 03/17/17   [provider]    Review of Systems    Two episodes of nocturnal chest pain in the past month.  On December 31, he had some lightheadedness.  Otherwise, he has done well.  He denies dyspnea, palpitations, PND, orthopnea, syncope, edema, or early satiety.  All other systems reviewed and are otherwise negative except as noted above.  Physical Exam    VS:  BP (!) 138/52 (BP Location: Left Arm, Patient Position: Sitting, Cuff Size: Normal)  Pulse (!) 57   Ht '5\' 10"'$  (1.778 m)   Wt 160 lb 12 oz (72.9 kg)   BMI 23.07 kg/m  , BMI Body mass index is 23.07 kg/m. GEN: Well nourished, well developed, in no acute distress. HEENT: normal. Neck: Supple, no JVD, carotid bruits, or masses. Cardiac: RRR, 2/6 systolic murmur heard throughout, no rubs, or gallops. No clubbing, cyanosis, edema.  Radials/PT 2+ and equal bilaterally.  Respiratory:  Respirations regular and unlabored, clear to auscultation bilaterally. GI: Soft, nontender, nondistended, BS + x 4. MS: no deformity or atrophy. Skin: warm and dry, no rash. Neuro:  Strength and sensation are intact. Psych: Normal affect.  Accessory Clinical Findings    ECG personally reviewed by me today -sinus bradycardia with sinus arrhythmia, first-degree AV block, left axis deviation, left anterior fascicular block right bundle branch block - no acute changes.  Lab Results  Component Value Date   WBC 5.8 10/26/2018   HGB 9.3 (L) 03/11/2019   HCT 29.7 (L) 03/11/2019   MCV 101.8 (H) 10/26/2018   PLT 128 (L) 10/26/2018   Lab Results  Component Value Date   CREATININE 1.53 (H) 04/06/2018   BUN 35 (H)  04/06/2018   NA 137 04/06/2018   K 4.8 04/06/2018   CL 103 04/06/2018   CO2 28 04/06/2018   Lab Results  Component Value Date   ALT 18 04/06/2018   AST 19 04/06/2018   ALKPHOS 71 04/06/2018   BILITOT 0.4 04/06/2018   Lab Results  Component Value Date   CHOL 125 04/06/2018   HDL 35.60 (L) 04/06/2018   LDLCALC 61 04/06/2018   TRIG 143.0 04/06/2018   CHOLHDL 4 04/06/2018    Lab Results  Component Value Date   HGBA1C 6.1 (A) 10/13/2018    Assessment & Plan    1.  Coronary artery disease/chronic nocturnal angina: Patient with a history of CAD and remote bypass times 05/20/92 with known two of three patent grafts by catheterization 2017, and otherwise severe native vessel disease.  He has experienced intermittent nocturnal angina over the years which is nitrate responsive.  He had multiple episodes of nocturnal angina prior to his last visit in December and at that time, I increased his Ranexa to 1000 mg twice daily.  He does not tolerate long-acting nitrates.  Since his last visit, he has only had two episodes of nitrate responsive nocturnal angina and none in the past 2 weeks.  Overall, this is an improvement for him.  ECG is unchanged today with stable first-degree AV block and QTC of 437 ms.  Continue aspirin, statin, beta-blocker, ARB, and ranolazine therapy.    2.  Essential hypertension: Blood pressure mildly elevated today though he brought records with him and he typically runs in the 120s.  No changes to medications today.  3. Lightheadedness/bradycardia: On December 31st, patient had an episode of lightheadedness.  He checked his blood pressure and heart rate at the time and noted a systolic blood pressure of 107 with a heart rate of 40.  I questioned if it was possible that he took more carvedilol than he was supposed to and he says it is always possible, though he does not typically make mistakes with his medications.  Heart rate 58 today.  We discussed possibly reducing his  carvedilol dose to 3.125 mg twice daily however he is concerned that his blood pressure will elevate if we did that, and as he has not had any recurrent symptomatic bradycardia, he prefers to just  keep his carvedilol where it is at, and he will notify us of any recurrent lightheadedness or more profound bradycardia.  If that were to occur, he will need event monitoring to assess for higher grades of heart block.  4.  Hyperlipidemia: LDL 61 in February 2020 with normal LFTs.  Continue statin therapy.  5.  First-degree AV block: Relatively stable @ 248.    6.  Carotid arterial dzs: s/p L CEA. Stable dzs on u/s 02/2019.  Cont asa/statin.    7.  CKD III:  F/u creat today in setting of ARB, diuretic, and ranolazine rx.  8.  DMII:  A1c 6.1 in 10/2018.  On insulin and amaryl.  9.  Disposition:  F/u bmet today.  F/u in 3 mos or sooner if necessary.  Murray Hodgkins, NP 03/24/2019, 1:57 PM

## 2019-03-25 ENCOUNTER — Telehealth: Payer: Self-pay

## 2019-03-25 DIAGNOSIS — I25118 Atherosclerotic heart disease of native coronary artery with other forms of angina pectoris: Secondary | ICD-10-CM

## 2019-03-25 LAB — BASIC METABOLIC PANEL
BUN/Creatinine Ratio: 20 (ref 10–24)
BUN: 37 mg/dL — ABNORMAL HIGH (ref 10–36)
CO2: 23 mmol/L (ref 20–29)
Calcium: 9.7 mg/dL (ref 8.6–10.2)
Chloride: 99 mmol/L (ref 96–106)
Creatinine, Ser: 1.87 mg/dL — ABNORMAL HIGH (ref 0.76–1.27)
GFR calc Af Amer: 36 mL/min/{1.73_m2} — ABNORMAL LOW (ref >59)
GFR calc non Af Amer: 31 mL/min/{1.73_m2} — ABNORMAL LOW (ref >59)
Glucose: 263 mg/dL — ABNORMAL HIGH (ref 65–99)
Potassium: 5.7 mmol/L — ABNORMAL HIGH (ref 3.5–5.2)
Sodium: 134 mmol/L (ref 134–144)

## 2019-03-25 NOTE — Telephone Encounter (Signed)
Call to patient to review labs.   Pt verbalized understanding and will push oral fluids today. Repeat bmet tomorrow.   Pt also reported that he will be going to [place] tomorrow to get his covid shot.   Advised pt to call for any further questions or concerns.

## 2019-03-25 NOTE — Telephone Encounter (Signed)
-----   Message from Theora Gianotti, NP sent at 03/25/2019 10:28 AM EST ----- Kidney fxn slightly more abnl than prior baseline - creat 1.87.  Potassium is also mildly elevated.  Encourage oral fluids and I'd like for him to have a f/u bmet @ the [facility] lab on Friday.  If K remains elevated at that time, we will have to hold losartan.

## 2019-03-26 ENCOUNTER — Telehealth: Payer: Self-pay

## 2019-03-26 ENCOUNTER — Other Ambulatory Visit
Admission: RE | Admit: 2019-03-26 | Discharge: 2019-03-26 | Disposition: A | Discharge status: Home / Self Care | Payer: Medicare Other | Source: Ambulatory Visit | Attending: Nurse Practitioner | Admitting: Nurse Practitioner

## 2019-03-26 DIAGNOSIS — I25118 Atherosclerotic heart disease of native coronary artery with other forms of angina pectoris: Secondary | ICD-10-CM | POA: Diagnosis not present

## 2019-03-26 DIAGNOSIS — E875 Hyperkalemia: Secondary | ICD-10-CM

## 2019-03-26 LAB — BASIC METABOLIC PANEL
Anion gap: 8 (ref 5–15)
BUN: 47 mg/dL — ABNORMAL HIGH (ref 8–23)
CO2: 26 mmol/L (ref 22–32)
Calcium: 9.7 mg/dL (ref 8.9–10.3)
Chloride: 103 mmol/L (ref 98–111)
Creatinine, Ser: 1.92 mg/dL — ABNORMAL HIGH (ref 0.61–1.24)
GFR calc Af Amer: 35 mL/min — ABNORMAL LOW (ref >60)
GFR calc non Af Amer: 30 mL/min — ABNORMAL LOW (ref >60)
Glucose, Bld: 207 mg/dL — ABNORMAL HIGH (ref 70–99)
Potassium: 5.9 mmol/L — ABNORMAL HIGH (ref 3.5–5.1)
Sodium: 137 mmol/L (ref 135–145)

## 2019-03-26 MED ORDER — SODIUM POLYSTYRENE SULFONATE 15 GM/60ML PO SUSP: 30.0000 g | Freq: Once | ORAL | 0 refills | Status: AC

## 2019-03-26 MED ORDER — SODIUM POLYSTYRENE SULFONATE PO POWD: Freq: Once | ORAL | 0 refills | Status: DC

## 2019-03-26 MED ORDER — VELTASSA 8.4 G PO PACK: 8.4000 g | PACK | Freq: Every day | ORAL | 0 refills | Status: AC

## 2019-03-26 NOTE — Telephone Encounter (Signed)
Call to patient to discuss ax to [place]. Per Ignacia Bayley, NP, okay to write for kayexalate in place of valtessa.   I did advise that he will be having lose stools as a normal side effect of this medication. Pt verbalized understanding.   [facility].   Advised pt to call for any further questions or concerns.

## 2019-03-26 NOTE — Telephone Encounter (Signed)
Spoke with [facility] on [address] Confirmed they have Kayexelate suspension in bulk and can pull a dose to be picked up this evening with pharmacist.   Patient was called. Spoke with him and daughter-in-law to relay instructions.   Instructed to pick up Kayelexate this evening, as soon as available, prior to the pharmacy closing at [place] [facility] address of [facility] and confirmed they had address correct. Instructed that if they did not pick up the Kayexelate this evening Mr. Talcott would have to go to ED to get it there.   Educated that [place] would allow for lowered potassium through waste in stool. Must be taken this evening. Recommended he stay close to the restroom.   Will call pharmacy to confirm that electronic Rx has been received.   Loel Dubonnet, NP

## 2019-03-26 NOTE — Telephone Encounter (Signed)
Call to patient to review results and POC from Ignacia Bayley, NP.   Pt verbalized undersing and all questions were answered. He will hold losartan and lasix until labs are resulted and he hears back from our office about plan going forward.  All orders were placed. [facility] Monday for repeat labs.   Advised pt to call for any further questions or concerns.

## 2019-03-26 NOTE — Telephone Encounter (Signed)
[facility] to [facility]. They do not have kayexalate and in the system, no other [facility] has it at this time. They advised that we call [facility] or [facility]. Made provider, Laurann Montana, NP aware. She reported that she would call them when she gets to a place where she can use computer as she is on the road at this time.   I made call to patient to let him know that we are still working on it and Urban Gibson will call when she has definite answer.

## 2019-03-26 NOTE — Telephone Encounter (Signed)
-----   Message from Theora Gianotti, NP sent at 03/26/2019  2:16 PM EST ----- K and creat so higher. Hold Lasix and losartan. Needs valtessa 8.4g today and tomorrow. Fu bmet monday.

## 2019-03-29 ENCOUNTER — Other Ambulatory Visit
Admission: RE | Admit: 2019-03-29 | Discharge: 2019-03-29 | Disposition: A | Discharge status: Home / Self Care | Payer: Medicare Other | Attending: Nurse Practitioner | Admitting: Nurse Practitioner

## 2019-03-29 ENCOUNTER — Other Ambulatory Visit: Payer: Self-pay

## 2019-03-29 DIAGNOSIS — I25118 Atherosclerotic heart disease of native coronary artery with other forms of angina pectoris: Secondary | ICD-10-CM | POA: Diagnosis not present

## 2019-03-29 LAB — BASIC METABOLIC PANEL
Anion gap: 11 (ref 5–15)
BUN: 42 mg/dL — ABNORMAL HIGH (ref 8–23)
CO2: 24 mmol/L (ref 22–32)
Calcium: 8.9 mg/dL (ref 8.9–10.3)
Chloride: 101 mmol/L (ref 98–111)
Creatinine, Ser: 1.67 mg/dL — ABNORMAL HIGH (ref 0.61–1.24)
GFR calc Af Amer: 41 mL/min — ABNORMAL LOW (ref >60)
GFR calc non Af Amer: 35 mL/min — ABNORMAL LOW (ref >60)
Glucose, Bld: 237 mg/dL — ABNORMAL HIGH (ref 70–99)
Potassium: 5 mmol/L (ref 3.5–5.1)
Sodium: 136 mmol/L (ref 135–145)

## 2019-03-31 ENCOUNTER — Telehealth: Payer: Self-pay

## 2019-03-31 NOTE — Telephone Encounter (Signed)
Attempted to call patient. [facility] 03/31/2019

## 2019-03-31 NOTE — Telephone Encounter (Signed)
-----   Message from Theora Gianotti, NP sent at 03/29/2019 12:54 PM EST ----- Potassium is improved @ 5.0.  Kidney function also improved.  He will need to remain off of losartan.  He may resume lasix 40mg  daily (prior dose) but will need a f/u bmet in a week to assess stability of potassium and kidney function. Continue to avoid high potassium foods.

## 2019-04-01 DIAGNOSIS — E119 Type 2 diabetes mellitus without complications: Secondary | ICD-10-CM | POA: Diagnosis not present

## 2019-04-01 LAB — HM DIABETES EYE EXAM

## 2019-04-02 ENCOUNTER — Other Ambulatory Visit: Payer: Self-pay | Admitting: Primary Care

## 2019-04-02 ENCOUNTER — Other Ambulatory Visit: Payer: Self-pay

## 2019-04-02 DIAGNOSIS — I25118 Atherosclerotic heart disease of native coronary artery with other forms of angina pectoris: Secondary | ICD-10-CM

## 2019-04-02 DIAGNOSIS — I8393 Asymptomatic varicose veins of bilateral lower extremities: Secondary | ICD-10-CM

## 2019-04-02 NOTE — Progress Notes (Signed)
See pt note for further detail.

## 2019-04-06 ENCOUNTER — Encounter: Payer: Self-pay | Admitting: Primary Care

## 2019-04-08 ENCOUNTER — Other Ambulatory Visit: Payer: Self-pay

## 2019-04-08 ENCOUNTER — Telehealth: Payer: Self-pay | Admitting: Nurse Practitioner

## 2019-04-08 ENCOUNTER — Inpatient Hospital Stay: Payer: Medicare Other | Attending: Oncology

## 2019-04-08 ENCOUNTER — Inpatient Hospital Stay: Payer: Medicare Other

## 2019-04-08 DIAGNOSIS — N183 Chronic kidney disease, stage 3 unspecified: Secondary | ICD-10-CM

## 2019-04-08 DIAGNOSIS — I129 Hypertensive chronic kidney disease with stage 1 through stage 4 chronic kidney disease, or unspecified chronic kidney disease: Secondary | ICD-10-CM | POA: Insufficient documentation

## 2019-04-08 DIAGNOSIS — D631 Anemia in chronic kidney disease: Secondary | ICD-10-CM | POA: Insufficient documentation

## 2019-04-08 DIAGNOSIS — Z79899 Other long term (current) drug therapy: Secondary | ICD-10-CM | POA: Diagnosis not present

## 2019-04-08 DIAGNOSIS — N189 Chronic kidney disease, unspecified: Secondary | ICD-10-CM | POA: Diagnosis not present

## 2019-04-08 DIAGNOSIS — N1832 Chronic kidney disease, stage 3b: Secondary | ICD-10-CM

## 2019-04-08 LAB — BASIC METABOLIC PANEL
Anion gap: 8 (ref 5–15)
BUN: 48 mg/dL — ABNORMAL HIGH (ref 8–23)
CO2: 23 mmol/L (ref 22–32)
Calcium: 9.4 mg/dL (ref 8.9–10.3)
Chloride: 102 mmol/L (ref 98–111)
Creatinine, Ser: 1.86 mg/dL — ABNORMAL HIGH (ref 0.61–1.24)
GFR calc Af Amer: 36 mL/min — ABNORMAL LOW (ref >60)
GFR calc non Af Amer: 31 mL/min — ABNORMAL LOW (ref >60)
Glucose, Bld: 240 mg/dL — ABNORMAL HIGH (ref 70–99)
Potassium: 5.2 mmol/L — ABNORMAL HIGH (ref 3.5–5.1)
Sodium: 133 mmol/L — ABNORMAL LOW (ref 135–145)

## 2019-04-08 LAB — HEMOGLOBIN: Hemoglobin: 10.1 g/dL — ABNORMAL LOW (ref 13.0–17.0)

## 2019-04-08 LAB — HEMATOCRIT: HCT: 31.8 % — ABNORMAL LOW (ref 39.0–52.0)

## 2019-04-08 NOTE — Telephone Encounter (Signed)
Call to patient to review BMET results and POC> pt verbalized understanding and no changes have been made at this time.   Confirmed upcoming appt in apr.   Advised pt to call for any further questions or concerns.

## 2019-04-08 NOTE — Telephone Encounter (Signed)
   Lab Results  Component Value Date   CREATININE 1.86 (H) 04/08/2019   BUN 48 (H) 04/08/2019   NA 133 (L) 04/08/2019   K 5.2 (H) 04/08/2019   CL 102 04/08/2019   CO2 23 04/08/2019    Potassium stable.  Creat within prior range.  Remain off of losartan.  Cont current dose of lasix.  Murray Hodgkins, NP 04/08/2019, 4:38 PM

## 2019-04-13 ENCOUNTER — Ambulatory Visit (INDEPENDENT_AMBULATORY_CARE_PROVIDER_SITE_OTHER): Payer: Medicare Other

## 2019-04-13 ENCOUNTER — Other Ambulatory Visit: Payer: Self-pay

## 2019-04-13 ENCOUNTER — Ambulatory Visit: Payer: Medicare Other

## 2019-04-13 VITALS — BP 136/70 | Wt 162.0 lb

## 2019-04-13 DIAGNOSIS — Z0000 Encounter for general adult medical examination without abnormal findings: Secondary | ICD-10-CM

## 2019-04-13 NOTE — Progress Notes (Signed)
PCP notes:  Health Maintenance: No gaps noted   Abnormal Screenings: none   Patient concerns: none   Nurse concerns: none   Next PCP appt.: 04/20/2019 @ 2:20 pm

## 2019-04-13 NOTE — Progress Notes (Signed)
Subjective:   Tommy [name] is a 86 y.o. male who presents for Medicare Annual/Subsequent preventive examination.  Review of Systems: N/A   This visit is being conducted through telemedicine via telephone at the nurse health advisor's home address due to the COVID-19 pandemic. This patient has given me verbal consent via doximity to conduct this visit, patient states they are participating from their home address. Patient and myself are on the telephone call. There is no referral for this visit. Some vital signs may be absent or patient reported.    Patient identification: identified by name, DOB, and current address   Cardiac Risk Factors include: advanced age (>55mn, >>56women);male gender;diabetes mellitus;hypertension;dyslipidemia     Objective:    Vitals: BP 136/70   Wt 162 lb (73.5 kg)   BMI 23.24 kg/m   Body mass index is 23.24 kg/m.  Advanced Directives 04/13/2019 08/10/2018 04/06/2018 03/17/2018 01/19/2018 09/15/2017 07/18/2017  Does Patient Have a Medical Advance Directive? Yes Yes Yes Yes Yes Yes Yes  Type of AParamedicof AAk-[place] VillageLiving will [place] will [place]Point PleasantLiving will Living will;Healthcare Power of [place]Living will  Does patient want to make changes to medical advance directive? - - - - - - -  Copy of [place]in Chart? No - copy requested No - copy requested No - copy requested - - - -    Tobacco Social History   Tobacco Use  Smoking Status Former Smoker  . Packs/day: 1.50  . Years: 25.00  . Pack years: 37.50  . Types: Cigarettes  . Quit date: 111 . Years since quitting: 43.1  Smokeless Tobacco Never Used     Counseling given: Not Answered   Clinical Intake:  Pre-visit preparation completed: Yes  Pain : No/denies pain     Nutritional Risks: None Diabetes: Yes CBG done?: No Did pt. bring in CBG monitor from  home?: No  How often do you need to have someone help you when you read instructions, pamphlets, or other written materials from your doctor or pharmacy?: 1 - Never What is the last grade level you completed in school?: college grad  Interpreter Needed?: No  Information entered by :: CJohnson, LPN  Past Medical History:  Diagnosis Date  . Anemia   . Carotid artery occlusion    a. 2004 s/p L carotid endarterectomy; b. 02/2019 U/S: 1-39% bilat ICA stenosis. <50% bilat CCA stenosis.  . CKD (chronic kidney disease) stage 3, GFR 30-59 ml/min   . Coronary artery disease    a. 1994 s/p CABG x 3, [place], [place] (LIMA->LAD, VG->dRCA, VG->unknown vessel); b. 02/2016 Cath: LM 99d, LAD 100ost, LCX 100ost, RCA 80ost, diffuse 85-97mLIMA->LAD ok, VG->RCA ok. VG->unknown vessel 100.  . Diabetes mellitus without complication ([facility]  . Dyslipidemia   . GERD (gastroesophageal reflux disease)   . Hemorrhoids   . 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 History of echocardiogram    a. 02/2017 Echo: EF 55-65%, no rwma. Nl DD. Ao sclerosis w/o stenosis. Mod TR. Mild MR.  . Marland Kitchenypertension   . Pulmonary embolism ([place]  . Thyroid disease    Past Surgical History:  Procedure Laterality Date  . CAROTID ENDARTERECTOMY Left 2004  . CORONARY ARTERY BYPASS GRAFT  1994   [name]  . TONSILLECTOMY AND ADENOIDECTOMY     Family History  Problem Relation Age of Onset  . Heart attack Mother   .  Heart disease Mother   . Stroke Mother   . Diabetes Father   . Coronary artery disease Sister    Social History   Socioeconomic History  . Marital status: Widowed    Spouse name: Not on file  . Number of children: 4  . Years of education: 16  . Highest education level: Bachelor's degree (e.g., BA, AB, BS)  Occupational History  . Occupation: retired  Tobacco Use  . Smoking status: Former Smoker    Packs/day: 1.50    Years: 25.00    Pack years: 37.50    Types: Cigarettes    Quit date: 1978     Years since quitting: 43.1  . Smokeless tobacco: Never Used  Substance and Sexual Activity  . Alcohol use: Yes    Alcohol/week: 1.0 standard drinks    Types: 1 Glasses of wine per week    Comment: socially   . Drug use: No  . Sexual activity: Not Currently  Other Topics Concern  . Not on file  Social History Narrative   Widower.   4 children, 7 grandchildren, 1 great grandchild.   Retired. Once worked as an account.    Enjoys exercising, spending time with family.    Social Determinants of Health   Financial Resource Strain:   . Difficulty of Paying Living Expenses: Not on file  Food Insecurity:   . Worried About Charity fundraiser in the Last Year: Not on file  . Ran Out of Food in the Last Year: Not on file  Transportation Needs:   . Lack of Transportation (Medical): Not on file  . Lack of Transportation (Non-Medical): Not on file  Physical Activity:   . Days of Exercise per Week: Not on file  . Minutes of Exercise per Session: Not on file  Stress:   . Feeling of Stress : Not on file  Social Connections:   . Frequency of Communication with Friends and Family: Not on file  . Frequency of Social Gatherings with Friends and Family: Not on file  . Attends Religious Services: Not on file  . Active Member of Clubs or Organizations: Not on file  . Attends Archivist Meetings: Not on file  . Marital Status: Not on file    Outpatient Encounter Medications as of 04/13/2019  Medication Sig  . aspirin 81 MG tablet Take 81 mg by mouth every evening.   Marland Kitchen atorvastatin (LIPITOR) 40 MG tablet TAKE 1 TABLET BY MOUTH  DAILY  . betamethasone valerate (VALISONE) 0.1 % cream APPLY TO AFFECTED AREA TWICE A DAY  . Blood Glucose Monitoring Suppl (ONETOUCH VERIO) w/Device KIT 1 Device by Does not apply route daily. Use as instructed to test blood sugar daily  . CALCIUM PO Take 600 mg by mouth every morning.   . carvedilol (COREG) 6.25 MG tablet TAKE 1 TABLET BY MOUTH  TWICE DAILY  WITH MEALS  . cholecalciferol (VITAMIN D3) 25 MCG (1000 UT) tablet Take 1,000 Units by mouth daily.  Marland Kitchen epoetin alfa (EPOGEN,PROCRIT) 91638 UNIT/ML injection 10,000 Units. Every 2 to 4 weeks  . furosemide (LASIX) 40 MG tablet TAKE 1 TABLET BY MOUTH  DAILY  . glimepiride (AMARYL) 4 MG tablet TAKE 1 TABLET BY MOUTH  DAILY WITH BREAKFAST  . Hydrocortisone (PROCTOSOL HC RE) Place rectally as needed.  . Insulin Pen Needle (B-D ULTRAFINE III SHORT PEN) 31G X 8 MM MISC USE AS INSTRUCTED TO INJECT INSULIN DAILY.  Marland Kitchen Insulin Pen Needle 29G X 12.7MM MISC BD  Ultrafine Pen Needle Use as instructed to inject insulin daily  . IRON PO Take by mouth 4 (four) times daily.   Marland Kitchen levothyroxine (SYNTHROID) 175 MCG tablet TAKE 1 TABLET BY MOUTH IN  THE MORNING ON AN EMPTY  STOMACH WITH A FULL GLASS  OF WATER  . losartan (COZAAR) 50 MG tablet Take 1 tablet (50 mg total) by mouth daily.  . Multiple Vitamin (MULTIVITAMIN) capsule Take 1 capsule by mouth daily.  . nitroGLYCERIN (NITROSTAT) 0.4 MG SL tablet Place 1 tablet (0.4 mg total) under the tongue every 5 (five) minutes as needed for chest pain.  . Omega-3 Fatty Acids (FISH OIL PO) Take 1,200 mg by mouth 4 (four) times daily.  Glory Rosebush DELICA LANCETS 73Z MISC USE AS INSTRUCT TO TEST BLOOD SUGAR ONCE DAILY  . ONETOUCH VERIO test strip USE AS INSTRUCTED TO TEST BLOOD SUGAR ONCE DAILY  . pantoprazole (PROTONIX) 20 MG tablet TAKE 1 TABLET BY MOUTH  DAILY  . ranolazine (RANEXA) 1000 MG SR tablet Take 1 tablet (1,000 mg total) by mouth 2 (two) times daily.  . timolol (TIMOPTIC) 0.5 % ophthalmic solution APPLY 1 DROP(S) IN BOTH EYES ONCE A DAY   No facility-administered encounter medications on file as of 04/13/2019.    Activities of Daily Living In your present state of health, do you have any difficulty performing the following activities: 04/13/2019  Hearing? Y  Comment wears hearing aids  Vision? N  Difficulty concentrating or making decisions? N  Walking or  climbing stairs? N  Dressing or bathing? N  Doing errands, shopping? N  Preparing Food and eating ? N  Using the Toilet? N  In the past six months, have you accidently leaked urine? N  Do you have problems with loss of bowel control? N  Managing your Medications? N  Managing your Finances? N  Housekeeping or managing your Housekeeping? N  Some recent data might be hidden    Patient Care Team: Pleas Koch, NP as PCP - General (Internal Medicine) End, Harrell Gave, MD as PCP - Cardiology (Cardiology) Estill Cotta, MD as Referring Physician (Ophthalmology) End, Harrell Gave, MD as Consulting Physician (Cardiology)   Assessment:   This is a routine wellness examination for Pershing.  Exercise Activities and Dietary recommendations Current Exercise Habits: Structured exercise class, Type of exercise: strength training/weights, Time (Minutes): > 60, Frequency (Times/Week): 3, Weekly Exercise (Minutes/Week): 0, Intensity: Moderate, Exercise limited by: None identified  Goals    . Increase physical activity     Starting 04/06/2018, I will continue to exercise for 90 minutes 3 days per week.     . Patient Stated     04/13/2019, I will continue to exercise for 90 minutes 3 days per week.       Fall Risk Fall Risk  04/13/2019 04/06/2018 04/01/2017  Falls in the past year? 1 0 Yes  Comment tripped over speed bump - tripped over curb; injury to forehead and nose; medical treatment including stitches  Number falls in past yr: 0 - 1  Injury with Fall? 0 - Yes  Risk for fall due to : Medication side effect - -  Follow up Falls evaluation completed;Falls prevention discussed - -   Is the patient's home free of loose throw rugs in walkways, pet beds, electrical cords, etc?   yes      Grab bars in the bathroom? yes      Handrails on the stairs?   yes      Adequate lighting?  yes  Timed Get Up and Go Performed: N/A  Depression Screen PHQ 2/9 Scores 04/13/2019 04/06/2018 04/01/2017  PHQ - 2  Score 0 0 0  PHQ- 9 Score 0 0 0    Cognitive Function MMSE - Mini Mental State Exam 04/13/2019 04/06/2018 04/01/2017  Orientation to time '5 5 5  '$ Orientation to Place '5 5 5  '$ Registration '3 3 3  '$ Attention/ Calculation 5 0 0  Recall '3 3 3  '$ Language- name 2 objects - 0 0  Language- repeat '1 1 1  '$ Language- follow 3 step command - 3 3  Language- read & follow direction - 0 0  Write a sentence - 0 0  Copy design - 0 0  Total score - 20 20  Mini Cog  Mini-Cog screen was completed. Maximum score is 22. A value of 0 denotes this part of the MMSE was not completed or the patient failed this part of the Mini-Cog screening.       Immunization History  Administered Date(s) Administered  . Fluad Quad(high Dose 65+) 11/17/2018  . Influenza,inj,Quad PF,6+ Mos 12/30/2017  . Influenza-Unspecified 11/02/2016  . PFIZER SARS-COV-2 Vaccination 03/26/2019  . Pneumococcal Conjugate-13 07/03/2015  . Pneumococcal Polysaccharide-23 04/01/2017  . Td 02/20/2017  . Zoster Recombinat (Shingrix) 11/03/2018, 02/11/2019    Qualifies for Shingles Vaccine? Completed series  Screening Tests Health Maintenance  Topic Date Due  . HEMOGLOBIN A1C  04/15/2019  . FOOT EXAM  10/13/2019  . OPHTHALMOLOGY EXAM  03/31/2020  . TETANUS/TDAP  02/21/2027  . INFLUENZA VACCINE  Completed  . PNA vac Low Risk Adult  Completed   Cancer Screenings: Lung: Low Dose CT Chest recommended if Age 23-80 years, 30 pack-year currently smoking OR have quit w/in 15years. Patient does not qualify. Colorectal: no longer required  Additional Screenings:  Hepatitis C Screening: N/A      Plan:   Patient will continue to exercise for 90 minutes 3 days per week.  I have personally reviewed and noted the following in the patient's chart:   . Medical and social history . Use of alcohol, tobacco or illicit drugs  . Current medications and supplements . Functional ability and status . Nutritional status . Physical activity . Advanced  directives . List of other physicians . Hospitalizations, surgeries, and ER visits in previous 12 months . Vitals . Screenings to include cognitive, depression, and falls . Referrals and appointments  In addition, I have reviewed and discussed with patient certain preventive protocols, quality metrics, and best practice recommendations. A written personalized care plan for preventive services as well as general preventive health recommendations were provided to patient.     Manus, Weedman, LPN  [id number]

## 2019-04-13 NOTE — Patient Instructions (Signed)
Tommy [name] , Thank you for taking time to come for your Medicare Wellness Visit. I appreciate your ongoing commitment to your health goals. Please review the following plan we discussed and let me know if I can assist you in the future.   Screening recommendations/referrals: Colonoscopy: no longer required Recommended yearly ophthalmology/optometry visit for glaucoma screening and checkup Recommended yearly dental visit for hygiene and checkup  Vaccinations: Influenza vaccine: Up to date, completed 11/17/2018 Pneumococcal vaccine: Completed series Tdap vaccine: Up to date, completed 02/20/2017 Shingles vaccine: Completed series    Advanced directives: Please bring a copy of your POA (Power of Attorney) and/or Living Will to your next appointment.   Conditions/risks identified: diabetes, hypertension, hyperlipidemia  Next appointment: 04/20/2019 @ 2:20 pm   Preventive Care 65 Years and Older, Male Preventive care refers to lifestyle choices and visits with your health care provider that can promote health and wellness. What does preventive care include?  A yearly physical exam. This is also called an annual well check.  Dental exams once or twice a year.  Routine eye exams. Ask your health care provider how often you should have your eyes checked.  Personal lifestyle choices, including:  Daily care of your teeth and gums.  Regular physical activity.  Eating a healthy diet.  Avoiding tobacco and drug use.  Limiting alcohol use.  Practicing safe sex.  Taking low doses of aspirin every day.  Taking vitamin and mineral supplements as recommended by your health care provider. What happens during an annual well check? The services and screenings done by your health care provider during your annual well check will depend on your age, overall health, lifestyle risk factors, and family history of disease. Counseling  Your health care provider may ask you questions about  your:  Alcohol use.  Tobacco use.  Drug use.  Emotional well-being.  Home and relationship well-being.  Sexual activity.  Eating habits.  History of falls.  Memory and ability to understand (cognition).  Work and work Statistician. Screening  You may have the following tests or measurements:  Height, weight, and BMI.  Blood pressure.  Lipid and cholesterol levels. These may be checked every 5 years, or more frequently if you are over 61 years old.  Skin check.  Lung cancer screening. You may have this screening every year starting at age 7 if you have a 30-pack-year history of smoking and currently smoke or have quit within the past 15 years.  Fecal occult blood test (FOBT) of the stool. You may have this test every year starting at age 33.  Flexible sigmoidoscopy or colonoscopy. You may have a sigmoidoscopy every 5 years or a colonoscopy every 10 years starting at age 28.  Prostate cancer screening. Recommendations will vary depending on your family history and other risks.  Hepatitis C blood test.  Hepatitis B blood test.  Sexually transmitted disease (STD) testing.  Diabetes screening. This is done by checking your blood sugar (glucose) after you have not eaten for a while (fasting). You may have this done every 1-3 years.  Abdominal aortic aneurysm (AAA) screening. You may need this if you are a current or former smoker.  Osteoporosis. You may be screened starting at age 11 if you are at high risk. Talk with your health care provider about your test results, treatment options, and if necessary, the need for more tests. Vaccines  Your health care provider may recommend certain vaccines, such as:  Influenza vaccine. This is recommended every year.  Tetanus, diphtheria, and acellular pertussis (Tdap, Td) vaccine. You may need a Td booster every 10 years.  Zoster vaccine. You may need this after age 3.  Pneumococcal 13-valent conjugate (PCV13) vaccine.  One dose is recommended after age 82.  Pneumococcal polysaccharide (PPSV23) vaccine. One dose is recommended after age 60. Talk to your health care provider about which screenings and vaccines you need and how often you need them. This information is not intended to replace advice given to you by your health care provider. Make sure you discuss any questions you have with your health care provider. Document Released: 03/17/2015 Document Revised: 11/08/2015 Document Reviewed: 12/20/2014 Elsevier Interactive Patient Education  2017 [place] Prevention in the Home Falls can cause injuries. They can happen to people of all ages. There are many things you can do to make your home safe and to help prevent falls. What can I do on the outside of my home?  Regularly fix the edges of walkways and driveways and fix any cracks.  Remove anything that might make you trip as you walk through a door, such as a raised step or threshold.  Trim any bushes or trees on the path to your home.  Use bright outdoor lighting.  Clear any walking paths of anything that might make someone trip, such as rocks or tools.  Regularly check to see if handrails are loose or broken. Make sure that both sides of any steps have handrails.  Any raised decks and porches should have guardrails on the edges.  Have any leaves, snow, or ice cleared regularly.  Use sand or salt on walking paths during winter.  Clean up any spills in your garage right away. This includes oil or grease spills. What can I do in the bathroom?  Use night lights.  Install grab bars by the toilet and in the tub and shower. Do not use towel bars as grab bars.  Use non-skid mats or decals in the tub or shower.  If you need to sit down in the shower, use a plastic, non-slip stool.  Keep the floor dry. Clean up any water that spills on the floor as soon as it happens.  Remove soap buildup in the tub or shower regularly.  Attach bath  mats securely with double-sided non-slip rug tape.  Do not have throw rugs and other things on the floor that can make you trip. What can I do in the bedroom?  Use night lights.  Make sure that you have a light by your bed that is easy to reach.  Do not use any sheets or blankets that are too big for your bed. They should not hang down onto the floor.  Have a firm chair that has side arms. You can use this for support while you get dressed.  Do not have throw rugs and other things on the floor that can make you trip. What can I do in the kitchen?  Clean up any spills right away.  Avoid walking on wet floors.  Keep items that you use a lot in easy-to-reach places.  If you need to reach something above you, use a strong step stool that has a grab bar.  Keep electrical cords out of the way.  Do not use floor polish or wax that makes floors slippery. If you must use wax, use non-skid floor wax.  Do not have throw rugs and other things on the floor that can make you trip. What can I do  with my stairs?  Do not leave any items on the stairs.  Make sure that there are handrails on both sides of the stairs and use them. Fix handrails that are broken or loose. Make sure that handrails are as long as the stairways.  Check any carpeting to make sure that it is firmly attached to the stairs. Fix any carpet that is loose or worn.  Avoid having throw rugs at the top or bottom of the stairs. If you do have throw rugs, attach them to the floor with carpet tape.  Make sure that you have a light switch at the top of the stairs and the bottom of the stairs. If you do not have them, ask someone to add them for you. What else can I do to help prevent falls?  Wear shoes that:  Do not have high heels.  Have rubber bottoms.  Are comfortable and fit you well.  Are closed at the toe. Do not wear sandals.  If you use a stepladder:  Make sure that it is fully opened. Do not climb a closed  stepladder.  Make sure that both sides of the stepladder are locked into place.  Ask someone to hold it for you, if possible.  Clearly mark and make sure that you can see:  Any grab bars or handrails.  First and last steps.  Where the edge of each step is.  Use tools that help you move around (mobility aids) if they are needed. These include:  Canes.  Walkers.  Scooters.  Crutches.  Turn on the lights when you go into a dark area. Replace any light bulbs as soon as they burn out.  Set up your furniture so you have a clear path. Avoid moving your furniture around.  If any of your floors are uneven, fix them.  If there are any pets around you, be aware of where they are.  Review your medicines with your doctor. Some medicines can make you feel dizzy. This can increase your chance of falling. Ask your doctor what other things that you can do to help prevent falls. This information is not intended to replace advice given to you by your health care provider. Make sure you discuss any questions you have with your health care provider. Document Released: 12/15/2008 Document Revised: 07/27/2015 Document Reviewed: 03/25/2014 Elsevier Interactive Patient Education  2017 [employer].

## 2019-04-14 ENCOUNTER — Other Ambulatory Visit: Payer: Self-pay | Admitting: Primary Care

## 2019-04-14 DIAGNOSIS — I2510 Atherosclerotic heart disease of native coronary artery without angina pectoris: Secondary | ICD-10-CM

## 2019-04-20 ENCOUNTER — Ambulatory Visit (INDEPENDENT_AMBULATORY_CARE_PROVIDER_SITE_OTHER): Payer: Medicare Other | Admitting: Primary Care

## 2019-04-20 ENCOUNTER — Other Ambulatory Visit: Payer: Self-pay

## 2019-04-20 ENCOUNTER — Encounter: Payer: Self-pay | Admitting: Primary Care

## 2019-04-20 VITALS — BP 134/70 | HR 63 | Temp 97.1°F | Ht 70.0 in | Wt 162.5 lb

## 2019-04-20 DIAGNOSIS — D631 Anemia in chronic kidney disease: Secondary | ICD-10-CM

## 2019-04-20 DIAGNOSIS — N1832 Chronic kidney disease, stage 3b: Secondary | ICD-10-CM

## 2019-04-20 DIAGNOSIS — I10 Essential (primary) hypertension: Secondary | ICD-10-CM | POA: Diagnosis not present

## 2019-04-20 DIAGNOSIS — K219 Gastro-esophageal reflux disease without esophagitis: Secondary | ICD-10-CM

## 2019-04-20 DIAGNOSIS — I25118 Atherosclerotic heart disease of native coronary artery with other forms of angina pectoris: Secondary | ICD-10-CM | POA: Diagnosis not present

## 2019-04-20 DIAGNOSIS — E119 Type 2 diabetes mellitus without complications: Secondary | ICD-10-CM

## 2019-04-20 DIAGNOSIS — E039 Hypothyroidism, unspecified: Secondary | ICD-10-CM | POA: Diagnosis not present

## 2019-04-20 DIAGNOSIS — D638 Anemia in other chronic diseases classified elsewhere: Secondary | ICD-10-CM | POA: Diagnosis not present

## 2019-04-20 DIAGNOSIS — N183 Chronic kidney disease, stage 3 unspecified: Secondary | ICD-10-CM | POA: Diagnosis not present

## 2019-04-20 DIAGNOSIS — E785 Hyperlipidemia, unspecified: Secondary | ICD-10-CM

## 2019-04-20 DIAGNOSIS — Z794 Long term (current) use of insulin: Secondary | ICD-10-CM | POA: Diagnosis not present

## 2019-04-20 NOTE — Assessment & Plan Note (Signed)
Following with nephrology and managed on Epogen. Repeat CBC pending.

## 2019-04-20 NOTE — Assessment & Plan Note (Signed)
Repeat A1C pending. Based off of glucose readings I suspect he's well within a controlled range.  Again we discussed that he could come down on the dose of Lantus given risks for hypoglycemia given his age. He kindly declines and would like to continue Lantus at 10 units and glimepiride.   Foot and eye exam UTD. Pneumonia vaccination UTD. No longer on ARB due to hyperkalemia. Managed on statin therapy.  Follow up in 6 months.

## 2019-04-20 NOTE — Assessment & Plan Note (Signed)
Doing well on PPI. Continue same.

## 2019-04-20 NOTE — Assessment & Plan Note (Signed)
Stable in the office today off of losartan. Continue carvedilol, furosemide. CMP pending. Repeat potassium pending.

## 2019-04-20 NOTE — Assessment & Plan Note (Signed)
Compliant to statin therapy. Repeat lipids pending.

## 2019-04-20 NOTE — Progress Notes (Signed)
Subjective:    Patient ID: Tommy [name], male    DOB: [birth year], 86 y.o.   MRN: [id number]  HPI  This visit occurred during the SARS-CoV-2 public health emergency.  Safety protocols were in place, including screening questions prior to the visit, additional usage of staff PPE, and extensive cleaning of exam room while observing appropriate contact time as indicated for disinfecting solutions.   Tommy [name] is a 86 year old male who presents today for [place] Part 2. He spoke with our health advisor last week.  1) CAD/Aortic Valve Stenosis/Hypertension/Carotid artery stenosis: Currently managed on carvedilol 6.25 mg BID, furosemide 40 mg, nitroglycerin, Ranexa BID. Following with cardiology with last visit being January 2021.   Evaluated in December 2020 after four episodes of anginal chest pain, Ranexa added, losartan discontinued due to hyperkalemia. Since January 2021 he's had one anginal chest pain attack.  BP Readings from Last 3 Encounters:  04/20/19 134/70  04/13/19 136/70  03/24/19 (!) 138/52     2) Type 2 Diabetes:  Current medications include: Glimepiride 4 mg, Lantus 8 units daily. He adjusted his Lantus does to 10 units several months ago on his own as he was seeing readings in the 110's-120's.  He is checking his blood glucose 1 times daily and is getting readings of:  AM fasting: 90's-low 100's  Lowest recent reading: 85  Last A1C: 6.1 in August 2020 Last Eye Exam: Due in January 2022 Last Foot Exam: Due in August 2021 Pneumonia Vaccination: UTD ACE/ARB: Losartan  Statin: atorvastatin   3) Hypothyroidism: Currently managed on levothyroxine 175 mcg. TSH from February 2020 with level of 1.8.  Due for repeat labs today. He is taking his levothyroxine every morning with juice. He takes his levothyroxine with his other medications, has done this for years.   4) CKD: Currently managed on Epogen and is following with nephrology. Last visit was several months. He is no  longer taking losartan.   Review of Systems  Eyes: Negative for visual disturbance.  Respiratory: Negative for shortness of breath.   Cardiovascular:       See HPI  Neurological: Negative for dizziness and headaches.       Past Medical History:  Diagnosis Date  . Anemia   . Carotid artery occlusion    a. 2004 s/p L carotid endarterectomy; b. 02/2019 U/S: 1-39% bilat ICA stenosis. <50% bilat CCA stenosis.  . CKD (chronic kidney disease) stage 3, GFR 30-59 ml/min   . Coronary artery disease    a. 1994 s/p CABG x 3, [place], [place] (LIMA->LAD, VG->dRCA, VG->unknown vessel); b. 02/2016 Cath: LM 99d, LAD 100ost, LCX 100ost, RCA 80ost, diffuse 85-25m LIMA->LAD ok, VG->RCA ok. VG->unknown vessel 100.  . Diabetes mellitus without complication ([facility]   . Dyslipidemia   . GERD (gastroesophageal reflux disease)   . Hemorrhoids   . 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 History of echocardiogram    a. 02/2017 Echo: EF 55-65%, no rwma. Nl DD. Ao sclerosis w/o stenosis. Mod TR. Mild MR.  .Marland KitchenHypertension   . Pulmonary embolism ([facility]   . Thyroid disease      Social History   Socioeconomic History  . Marital status: Widowed    Spouse name: Not on file  . Number of children: 4  . Years of education: 16  . Highest education level: Bachelor's degree (e.g., BA, AB, BS)  Occupational History  . Occupation: retired  Tobacco Use  . Smoking status: Former Smoker    Packs/day:  1.50    Years: 25.00    Pack years: 37.50    Types: Cigarettes    Quit date: 32    Years since quitting: 43.1  . Smokeless tobacco: Never Used  Substance and Sexual Activity  . Alcohol use: Yes    Alcohol/week: 1.0 standard drinks    Types: 1 Glasses of wine per week    Comment: socially   . Drug use: No  . Sexual activity: Not Currently  Other Topics Concern  . Not on file  Social History Narrative   Widower.   4 children, 7 grandchildren, 1 great grandchild.   Retired. Once worked as an account.     Enjoys exercising, spending time with family.    Social Determinants of Health   Financial Resource Strain:   . Difficulty of Paying Living Expenses: Not on file  Food Insecurity:   . Worried About Charity fundraiser in the Last Year: Not on file  . Ran Out of Food in the Last Year: Not on file  Transportation Needs:   . Lack of Transportation (Medical): Not on file  . Lack of Transportation (Non-Medical): Not on file  Physical Activity:   . Days of Exercise per Week: Not on file  . Minutes of Exercise per Session: Not on file  Stress:   . Feeling of Stress : Not on file  Social Connections:   . Frequency of Communication with Friends and Family: Not on file  . Frequency of Social Gatherings with Friends and Family: Not on file  . Attends Religious Services: Not on file  . Active Member of Clubs or Organizations: Not on file  . Attends Archivist Meetings: Not on file  . Marital Status: Not on file  Intimate Partner Violence:   . Fear of Current or Ex-Partner: Not on file  . Emotionally Abused: Not on file  . Physically Abused: Not on file  . Sexually Abused: Not on file    Past Surgical History:  Procedure Laterality Date  . CAROTID ENDARTERECTOMY Left 2004  . CORONARY ARTERY BYPASS GRAFT  1994   [name]  . TONSILLECTOMY AND ADENOIDECTOMY      Family History  Problem Relation Age of Onset  . Heart attack Mother   . Heart disease Mother   . Stroke Mother   . Diabetes Father   . Coronary artery disease Sister     Allergies  Allergen Reactions  . Heparin Other (See Comments)  . Patiromer Other (See Comments)  . Sodium Zirconium Cyclosilicate Other (See Comments)    Current Outpatient Medications on File Prior to Visit  Medication Sig Dispense Refill  . aspirin 81 MG tablet Take 81 mg by mouth every evening.     Marland Kitchen atorvastatin (LIPITOR) 40 MG tablet TAKE 1 TABLET BY MOUTH  DAILY 90 tablet 1  . betamethasone valerate (VALISONE) 0.1 % cream  APPLY TO AFFECTED AREA TWICE A DAY 30 g 0  . Blood Glucose Monitoring Suppl (ONETOUCH VERIO) w/Device KIT 1 Device by Does not apply route daily. Use as instructed to test blood sugar daily 1 kit 0  . CALCIUM PO Take 600 mg by mouth every morning.     . carvedilol (COREG) 6.25 MG tablet TAKE 1 TABLET BY MOUTH  TWICE DAILY WITH MEALS 180 tablet 1  . cholecalciferol (VITAMIN D3) 25 MCG (1000 UT) tablet Take 1,000 Units by mouth daily.    Marland Kitchen epoetin alfa (EPOGEN,PROCRIT) 37482 UNIT/ML injection 10,000 Units. Every 2  to 4 weeks    . furosemide (LASIX) 40 MG tablet TAKE 1 TABLET BY MOUTH  DAILY 90 tablet 1  . glimepiride (AMARYL) 4 MG tablet TAKE 1 TABLET BY MOUTH  DAILY WITH BREAKFAST 90 tablet 1  . Hydrocortisone (PROCTOSOL HC RE) Place rectally as needed.    . Insulin Glargine (LANTUS) 100 UNIT/ML Solostar Pen Inject 10 Units into the skin daily.    . Insulin Pen Needle 29G X 12.7MM MISC BD Ultrafine Pen Needle Use as instructed to inject insulin daily 300 each 1  . IRON PO Take by mouth 4 (four) times daily.     Marland Kitchen levothyroxine (SYNTHROID) 175 MCG tablet TAKE 1 TABLET BY MOUTH IN  THE MORNING ON AN EMPTY  STOMACH WITH A FULL GLASS  OF WATER 90 tablet 1  . Multiple Vitamin (MULTIVITAMIN) capsule Take 1 capsule by mouth daily.    . nitroGLYCERIN (NITROSTAT) 0.4 MG SL tablet Place 1 tablet (0.4 mg total) under the tongue every 5 (five) minutes as needed for chest pain. 25 tablet 3  . Omega-3 Fatty Acids (FISH OIL PO) Take 1,200 mg by mouth 4 (four) times daily.    Glory Rosebush DELICA LANCETS 18H MISC USE AS INSTRUCT TO TEST BLOOD SUGAR ONCE DAILY 100 each 4  . ONETOUCH VERIO test strip USE AS INSTRUCTED TO TEST BLOOD SUGAR ONCE DAILY 100 each 5  . pantoprazole (PROTONIX) 20 MG tablet TAKE 1 TABLET BY MOUTH  DAILY 90 tablet 1  . ranolazine (RANEXA) 1000 MG SR tablet Take 1 tablet (1,000 mg total) by mouth 2 (two) times daily. 180 tablet 3  . timolol (TIMOPTIC) 0.5 % ophthalmic solution APPLY 1 DROP(S) IN  BOTH EYES ONCE A DAY  11   No current facility-administered medications on file prior to visit.    BP 134/70   Pulse 63   Temp (!) 97.1 F (36.2 C) (Temporal)   Ht '5\' 10"'$  (1.778 m)   Wt 162 lb 8 oz (73.7 kg)   SpO2 98%   BMI 23.32 kg/m    Objective:   Physical Exam  Constitutional: He appears well-nourished.  Cardiovascular: Normal rate and regular rhythm.  Respiratory: Effort normal and breath sounds normal.  Musculoskeletal:     Cervical back: Neck supple.  Skin: Skin is warm and dry.  Psychiatric: He has a normal mood and affect.           Assessment & Plan:

## 2019-04-20 NOTE — Patient Instructions (Signed)
Stop by the lab prior to leaving today. I will notify you of your results once received.   Be sure to take your levothyroxine (thyroid medication) every morning on an empty stomach with water only. No food or other medications for 30 minutes. No heartburn medication, iron pills, calcium, vitamin D, or magnesium pills within four hours of taking levothyroxine.   Please notify me if you consistently see blood sugar readings in the 80's or below 80.   Please schedule a follow up appointment in 6 months for diabetes check.   It was a pleasure to see you today!

## 2019-04-20 NOTE — Assessment & Plan Note (Signed)
He is taking levothyroxine incorrectly, we discussed proper directions for administration.   Repeat TSH pending.

## 2019-04-20 NOTE — Assessment & Plan Note (Signed)
Following with nephrology. Continue Epogen. Repeat CMP and CBC pending.

## 2019-04-20 NOTE — Assessment & Plan Note (Signed)
Following with cardiology. Angina improved with addition of Ranexa. Continue carvedilol and statin therapy.

## 2019-04-21 DIAGNOSIS — L570 Actinic keratosis: Secondary | ICD-10-CM | POA: Diagnosis not present

## 2019-04-21 DIAGNOSIS — D225 Melanocytic nevi of trunk: Secondary | ICD-10-CM | POA: Diagnosis not present

## 2019-04-21 DIAGNOSIS — D1801 Hemangioma of skin and subcutaneous tissue: Secondary | ICD-10-CM | POA: Diagnosis not present

## 2019-04-21 DIAGNOSIS — L820 Inflamed seborrheic keratosis: Secondary | ICD-10-CM | POA: Diagnosis not present

## 2019-04-21 DIAGNOSIS — D699 Hemorrhagic condition, unspecified: Secondary | ICD-10-CM | POA: Diagnosis not present

## 2019-04-21 DIAGNOSIS — Z1283 Encounter for screening for malignant neoplasm of skin: Secondary | ICD-10-CM | POA: Diagnosis not present

## 2019-04-21 DIAGNOSIS — D229 Melanocytic nevi, unspecified: Secondary | ICD-10-CM | POA: Diagnosis not present

## 2019-04-21 DIAGNOSIS — Z85828 Personal history of other malignant neoplasm of skin: Secondary | ICD-10-CM | POA: Diagnosis not present

## 2019-04-21 DIAGNOSIS — L578 Other skin changes due to chronic exposure to nonionizing radiation: Secondary | ICD-10-CM | POA: Diagnosis not present

## 2019-04-21 DIAGNOSIS — L821 Other seborrheic keratosis: Secondary | ICD-10-CM | POA: Diagnosis not present

## 2019-04-21 DIAGNOSIS — D2230 Melanocytic nevi of unspecified part of face: Secondary | ICD-10-CM | POA: Diagnosis not present

## 2019-04-21 LAB — COMPREHENSIVE METABOLIC PANEL
ALT: 22 U/L (ref 0–53)
AST: 22 U/L (ref 0–37)
Albumin: 4 g/dL (ref 3.5–5.2)
Alkaline Phosphatase: 80 U/L (ref 39–117)
BUN: 34 mg/dL — ABNORMAL HIGH (ref 6–23)
CO2: 29 mEq/L (ref 19–32)
Calcium: 9.5 mg/dL (ref 8.4–10.5)
Chloride: 100 mEq/L (ref 96–112)
Creatinine, Ser: 1.6 mg/dL — ABNORMAL HIGH (ref 0.40–1.50)
GFR: 40.78 mL/min — ABNORMAL LOW (ref >60.00)
Glucose, Bld: 161 mg/dL — ABNORMAL HIGH (ref 70–99)
Potassium: 5.2 mEq/L — ABNORMAL HIGH (ref 3.5–5.1)
Sodium: 135 mEq/L (ref 135–145)
Total Bilirubin: 0.4 mg/dL (ref 0.2–1.2)
Total Protein: 6.7 g/dL (ref 6.0–8.3)

## 2019-04-21 LAB — CBC
HCT: 29.5 % — ABNORMAL LOW (ref 39.0–52.0)
Hemoglobin: 10.1 g/dL — ABNORMAL LOW (ref 13.0–17.0)
MCHC: 34.1 g/dL (ref 30.0–36.0)
MCV: 101 fl — ABNORMAL HIGH (ref 78.0–100.0)
Platelets: 136 10*3/uL — ABNORMAL LOW (ref 150.0–400.0)
RBC: 2.92 Mil/uL — ABNORMAL LOW (ref 4.22–5.81)
RDW: 13 % (ref 11.5–15.5)
WBC: 6 10*3/uL (ref 4.0–10.5)

## 2019-04-21 LAB — LIPID PANEL
Cholesterol: 129 mg/dL (ref 0–200)
HDL: 44.5 mg/dL (ref >39.00)
LDL Cholesterol: 50 mg/dL (ref 0–99)
NonHDL: 84.89
Total CHOL/HDL Ratio: 3
Triglycerides: 176 mg/dL — ABNORMAL HIGH (ref 0.0–149.0)
VLDL: 35.2 mg/dL (ref 0.0–40.0)

## 2019-04-21 LAB — TSH: TSH: 1.28 u[IU]/mL (ref 0.35–4.50)

## 2019-04-21 LAB — HEMOGLOBIN A1C: Hgb A1c MFr Bld: 7.6 % — ABNORMAL HIGH (ref 4.6–6.5)

## 2019-04-25 ENCOUNTER — Other Ambulatory Visit: Payer: Self-pay | Admitting: Primary Care

## 2019-04-25 DIAGNOSIS — K219 Gastro-esophageal reflux disease without esophagitis: Secondary | ICD-10-CM

## 2019-04-25 DIAGNOSIS — I2510 Atherosclerotic heart disease of native coronary artery without angina pectoris: Secondary | ICD-10-CM

## 2019-05-08 ENCOUNTER — Other Ambulatory Visit: Payer: Self-pay | Admitting: Nurse Practitioner

## 2019-05-11 ENCOUNTER — Inpatient Hospital Stay: Payer: Medicare Other

## 2019-05-11 ENCOUNTER — Inpatient Hospital Stay (HOSPITAL_BASED_OUTPATIENT_CLINIC_OR_DEPARTMENT_OTHER): Payer: Medicare Other | Admitting: Oncology

## 2019-05-11 ENCOUNTER — Other Ambulatory Visit: Payer: Self-pay

## 2019-05-11 ENCOUNTER — Encounter: Payer: Self-pay | Admitting: Oncology

## 2019-05-11 ENCOUNTER — Inpatient Hospital Stay: Payer: Medicare Other | Attending: Oncology

## 2019-05-11 VITALS — BP 158/73 | HR 54 | Temp 96.3°F | Resp 16 | Wt 160.1 lb

## 2019-05-11 DIAGNOSIS — Z86711 Personal history of pulmonary embolism: Secondary | ICD-10-CM | POA: Insufficient documentation

## 2019-05-11 DIAGNOSIS — E119 Type 2 diabetes mellitus without complications: Secondary | ICD-10-CM | POA: Insufficient documentation

## 2019-05-11 DIAGNOSIS — D631 Anemia in chronic kidney disease: Secondary | ICD-10-CM

## 2019-05-11 DIAGNOSIS — K649 Unspecified hemorrhoids: Secondary | ICD-10-CM | POA: Diagnosis not present

## 2019-05-11 DIAGNOSIS — N1832 Chronic kidney disease, stage 3b: Secondary | ICD-10-CM

## 2019-05-11 DIAGNOSIS — K219 Gastro-esophageal reflux disease without esophagitis: Secondary | ICD-10-CM | POA: Diagnosis not present

## 2019-05-11 DIAGNOSIS — N184 Chronic kidney disease, stage 4 (severe): Secondary | ICD-10-CM | POA: Diagnosis not present

## 2019-05-11 DIAGNOSIS — E785 Hyperlipidemia, unspecified: Secondary | ICD-10-CM | POA: Insufficient documentation

## 2019-05-11 DIAGNOSIS — I129 Hypertensive chronic kidney disease with stage 1 through stage 4 chronic kidney disease, or unspecified chronic kidney disease: Secondary | ICD-10-CM | POA: Diagnosis not present

## 2019-05-11 DIAGNOSIS — N183 Chronic kidney disease, stage 3 unspecified: Secondary | ICD-10-CM

## 2019-05-11 DIAGNOSIS — Z87891 Personal history of nicotine dependence: Secondary | ICD-10-CM | POA: Diagnosis not present

## 2019-05-11 DIAGNOSIS — I2510 Atherosclerotic heart disease of native coronary artery without angina pectoris: Secondary | ICD-10-CM | POA: Diagnosis not present

## 2019-05-11 DIAGNOSIS — Z7982 Long term (current) use of aspirin: Secondary | ICD-10-CM | POA: Diagnosis not present

## 2019-05-11 DIAGNOSIS — Z79899 Other long term (current) drug therapy: Secondary | ICD-10-CM | POA: Diagnosis not present

## 2019-05-11 LAB — CBC WITH DIFFERENTIAL/PLATELET
Abs Immature Granulocytes: 0.02 10*3/uL (ref 0.00–0.07)
Basophils Absolute: 0 10*3/uL (ref 0.0–0.1)
Basophils Relative: 1 %
Eosinophils Absolute: 0.2 10*3/uL (ref 0.0–0.5)
Eosinophils Relative: 3 %
HCT: 30.2 % — ABNORMAL LOW (ref 39.0–52.0)
Hemoglobin: 9.7 g/dL — ABNORMAL LOW (ref 13.0–17.0)
Immature Granulocytes: 0 %
Lymphocytes Relative: 18 %
Lymphs Abs: 1.1 10*3/uL (ref 0.7–4.0)
MCH: 33.3 pg (ref 26.0–34.0)
MCHC: 32.1 g/dL (ref 30.0–36.0)
MCV: 103.8 fL — ABNORMAL HIGH (ref 80.0–100.0)
Monocytes Absolute: 0.7 10*3/uL (ref 0.1–1.0)
Monocytes Relative: 12 %
Neutro Abs: 3.8 10*3/uL (ref 1.7–7.7)
Neutrophils Relative %: 66 %
Platelets: 154 10*3/uL (ref 150–400)
RBC: 2.91 MIL/uL — ABNORMAL LOW (ref 4.22–5.81)
RDW: 12.7 % (ref 11.5–15.5)
WBC: 5.8 10*3/uL (ref 4.0–10.5)
nRBC: 0 % (ref 0.0–0.2)

## 2019-05-11 MED ORDER — EPOETIN ALFA-EPBX 10000 UNIT/ML IJ SOLN
20000.0000 [IU] | Freq: Once | INTRAMUSCULAR | Status: AC
  Administered 2019-05-11: 20000 [IU] via SUBCUTANEOUS
  Filled 2019-05-11: qty 2

## 2019-05-11 NOTE — Progress Notes (Signed)
Hematology/Oncology follow up note [facility] [phone] [phone]   Patient Care Team: Pleas Koch, NP as PCP - General (Internal Medicine) End, Harrell Gave, MD as PCP - Cardiology (Cardiology) Dingeldein, Remo Lipps, MD as Referring Physician (Ophthalmology) End, Harrell Gave, MD as Consulting Physician (Cardiology)  REFERRING PROVIDER: Dr.Lateef REASON FOR VISIT Follow up for treatment of anemia of CKD, on erythropoietin replacement therapy.  HISTORY OF PRESENTING ILLNESS:  Tommy [name] is a  86 y.o.  male with PMH listed below who was referred to me for evaluation of anemia.  Patient recently removed from [place] to [place] to live with his son.  He has chronic kidney disease and anemia associated with CKD.  He reports that he has been getting Procrit 10,000 units every 2 weeks for many years.  Denies any recent thrombosis events.  Patient reports that at the last Procrit shot he received was back in September 2018 before he moved here. He establish care with Dr. Holley Raring for his CKD and he was referred to me for evaluation of anemia and possible Procrit shots.  Patient had lab work done at [facility] on 05/15/2017 hemoglobin 9.6, MCV 98, WBC 4.5, platelet count 1 70,000, normal differential use.   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 He has IVC filter in place.   INTERVAL HISTORY Tommy [name] is a 86 y.o. male who has above history reviewed by me today presents for follow-up for anemia due to CKD. Patient has been on erythropoietin therapy monthly if hemoglobin is less than 10. Patient reports feeling well.  Patient has no new complaints today.  Denies any shortness of breath, chest pain, lower extremity swelling or abdominal pain. Chronic fatigue is at baseline.  No change .   Review of Systems  Constitutional: Positive for  malaise/fatigue. Negative for chills, fever and weight loss.  HENT: Negative for sore throat.   Eyes: Negative for redness.  Respiratory: Negative for cough, shortness of breath and wheezing.   Cardiovascular: Negative for chest pain, palpitations and leg swelling.  Gastrointestinal: Negative for abdominal pain, blood in stool, nausea and vomiting.  Genitourinary: Negative for dysuria.  Musculoskeletal: Negative for myalgias.  Skin: Negative for rash.  Neurological: Negative for dizziness, tingling and tremors.  Endo/Heme/Allergies: Does not bruise/bleed easily.  Psychiatric/Behavioral: Negative for hallucinations.    MEDICAL HISTORY:  Past Medical History:  Diagnosis Date  . Anemia   . Carotid artery occlusion    a. 2004 s/p L carotid endarterectomy; b. 02/2019 U/S: 1-39% bilat ICA stenosis. <50% bilat CCA stenosis.  . CKD (chronic kidney disease) stage 3, GFR 30-59 ml/min   . Coronary artery disease    a. 1994 s/p CABG x 3, [place], [place] (LIMA->LAD, VG->dRCA, VG->unknown vessel); b. 02/2016 Cath: LM 99d, LAD 100ost, LCX 100ost, RCA 80ost, diffuse 85-36m LIMA->LAD ok, VG->RCA ok. VG->unknown vessel 100.  . Diabetes mellitus without complication ([facility]   . Dyslipidemia   . GERD (gastroesophageal reflux disease)   . Hemorrhoids   . 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 History of echocardiogram    a. 02/2017 Echo: EF 55-65%, no rwma. Nl DD. Ao sclerosis w/o stenosis. Mod TR. Mild MR.  .Marland KitchenHypertension   . [place]   . Thyroid disease     SURGICAL HISTORY: Past Surgical History:  Procedure Laterality Date  . CAROTID ENDARTERECTOMY Left 2004  . CORONARY ARTERY BYPASS GRAFT  1994   [name]  . TONSILLECTOMY AND ADENOIDECTOMY      SOCIAL HISTORY: Social History   Socioeconomic History  . Marital status: Widowed    Spouse name: Not on file  . Number of children: 4  . Years of education: 16  . Highest education level: Bachelor's degree (e.g.,  BA, AB, BS)  Occupational History  . Occupation: retired  Tobacco Use  . Smoking status: Former Smoker    Packs/day: 1.50    Years: 25.00    Pack years: 37.50    Types: Cigarettes    Quit date: 1978    Years since quitting: 43.2  . Smokeless tobacco: Never Used  Substance and Sexual Activity  . Alcohol use: Yes    Alcohol/week: 1.0 standard drinks    Types: 1 Glasses of wine per week    Comment: socially   . Drug use: No  . Sexual activity: Not Currently  Other Topics Concern  . Not on file  Social History Narrative   Widower.   4 children, 7 grandchildren, 1 great grandchild.   Retired. Once worked as an account.    Enjoys exercising, spending time with family.    Social Determinants of Health   Financial Resource Strain:   . Difficulty of Paying Living Expenses: Not on file  Food Insecurity:   . Worried About Charity fundraiser in the Last Year: Not on file  . Ran Out of Food in the Last Year: Not on file  Transportation Needs:   . Lack of Transportation (Medical): Not on file  . Lack of Transportation (Non-Medical): Not on file  Physical Activity:   . Days of Exercise per Week: Not on file  . Minutes of Exercise per Session: Not on file  Stress:   . Feeling of Stress : Not on file  Social Connections:   . Frequency of Communication with Friends and Family: Not on file  . Frequency of Social Gatherings with Friends and Family: Not on file  . Attends Religious Services: Not on file  . Active Member of Clubs or Organizations: Not on file  . Attends Archivist Meetings: Not on file  . Marital Status: Not on file  Intimate Partner Violence:   . Fear of Current or Ex-Partner: Not on file  . Emotionally Abused: Not on file  . Physically Abused: Not on file  . Sexually Abused: Not on file    FAMILY HISTORY: Family History  Problem Relation Age of Onset  . Heart attack Mother   . Heart disease Mother   . Stroke Mother   . Diabetes Father   .  Coronary artery disease Sister     ALLERGIES:  is allergic to heparin; patiromer; and sodium zirconium cyclosilicate.  MEDICATIONS:  Current Outpatient Medications  Medication Sig Dispense Refill  . aspirin 81 MG tablet Take 81 mg by mouth every evening.     Marland Kitchen atorvastatin (LIPITOR) 40 MG tablet TAKE 1 TABLET BY MOUTH  DAILY 90 tablet 1  . betamethasone valerate (VALISONE) 0.1 % cream APPLY TO AFFECTED AREA TWICE A DAY 30 g 0  . Blood Glucose Monitoring Suppl (ONETOUCH VERIO) w/Device KIT 1 Device by Does not apply route daily. Use as instructed to test blood sugar daily 1 kit 0  . CALCIUM PO Take 600 mg by mouth every morning.     . carvedilol (COREG) 6.25 MG tablet TAKE 1 TABLET BY MOUTH  TWICE DAILY WITH MEALS 180 tablet 1  .  cholecalciferol (VITAMIN D3) 25 MCG (1000 UT) tablet Take 1,000 Units by mouth daily.    Marland Kitchen epoetin alfa (EPOGEN,PROCRIT) 01779 UNIT/ML injection 10,000 Units. Every 2 to 4 weeks    . furosemide (LASIX) 40 MG tablet TAKE 1 TABLET BY MOUTH  DAILY 90 tablet 1  . glimepiride (AMARYL) 4 MG tablet TAKE 1 TABLET BY MOUTH  DAILY WITH BREAKFAST 90 tablet 1  . Hydrocortisone (PROCTOSOL HC RE) Place rectally as needed.    . Insulin Glargine (LANTUS) 100 UNIT/ML Solostar Pen Inject 10 Units into the skin daily.    . Insulin Pen Needle 29G X 12.7MM MISC BD Ultrafine Pen Needle Use as instructed to inject insulin daily 300 each 1  . IRON PO Take by mouth. 2 QAM 1 QPM    . levothyroxine (SYNTHROID) 175 MCG tablet TAKE 1 TABLET BY MOUTH IN  THE MORNING ON AN EMPTY  STOMACH WITH A FULL GLASS  OF WATER 90 tablet 1  . Multiple Vitamin (MULTIVITAMIN) capsule Take 1 capsule by mouth daily.    . nitroGLYCERIN (NITROSTAT) 0.4 MG SL tablet Place 1 tablet (0.4 mg total) under the tongue every 5 (five) minutes as needed for chest pain. 25 tablet 3  . Omega-3 Fatty Acids (FISH OIL PO) Take 1,200 mg by mouth 4 (four) times daily.    Glory Rosebush DELICA LANCETS 39Q MISC USE AS INSTRUCT TO TEST  BLOOD SUGAR ONCE DAILY 100 each 4  . ONETOUCH VERIO test strip USE AS INSTRUCTED TO TEST BLOOD SUGAR ONCE DAILY 100 each 5  . pantoprazole (PROTONIX) 20 MG tablet TAKE 1 TABLET BY MOUTH  DAILY 90 tablet 1  . ranolazine (RANEXA) 1000 MG SR tablet Take 1 tablet (1,000 mg total) by mouth 2 (two) times daily. 180 tablet 3  . timolol (TIMOPTIC) 0.5 % ophthalmic solution APPLY 1 DROP(S) IN BOTH EYES ONCE A DAY  11   No current facility-administered medications for this visit.     PHYSICAL EXAMINATION: ECOG PERFORMANCE STATUS: 1 - Symptomatic but completely ambulatory Vitals:   05/11/19 1043  BP: (!) 158/73  Pulse: (!) 54  Resp: 16  Temp: (!) 96.3 F (35.7 C)   Filed Weights   05/11/19 1043  Weight: 160 lb 1.6 oz (72.6 kg)    Physical Exam  Constitutional: He is oriented to person, place, and time. No distress.  Patient walks independently  HENT:  Head: Normocephalic and atraumatic.  Mouth/Throat: No oropharyngeal exudate.  Eyes: Pupils are equal, round, and reactive to light. EOM are normal. No scleral icterus.  Cardiovascular: Normal rate and regular rhythm.  No murmur heard. Pulmonary/Chest: Effort normal. No respiratory distress.  Abdominal: Soft. He exhibits no distension.  Musculoskeletal:        General: No edema. Normal range of motion.     Cervical back: Normal range of motion and neck supple.  Neurological: He is alert and oriented to person, place, and time.  Skin: Skin is warm and dry. He is not diaphoretic. No erythema.  Psychiatric: Affect normal.     LABORATORY DATA:  I have reviewed the data as listed Lab Results  Component Value Date   WBC 5.8 05/11/2019   HGB 9.7 (L) 05/11/2019   HCT 30.2 (L) 05/11/2019   MCV 103.8 (H) 05/11/2019   PLT 154 05/11/2019   Recent Labs    03/26/19 1315 03/26/19 1315 03/29/19 1222 04/08/19 1111 04/20/19 1449  NA 137   < > 136 133* 135  K 5.9*   < >  5.0 5.2* 5.2*  CL 103   < > 101 102 100  CO2 26   < > _0 GLUCOSE 207*   < > 237* 240* 161*  BUN 47*   < > 42* 48* 34*  CREATININE 1.92*   < > 1.67* 1.86* 1.60*  CALCIUM 9.7   < > 8.9 9.4 9.5  GFRNONAA 30*  --  35* 31*  --   GFRAA 35*  --  41* 36*  --   PROT  --   --   --   --  6.7  ALBUMIN  --   --   --   --  4.0  AST  --   --   --   --  22  ALT  --   --   --   --  22  ALKPHOS  --   --   --   --  80  BILITOT  --   --   --   --  0.4   < > = values in this interval not displayed.     Iron/TIBC/Ferritin/ %Sat    Component Value Date/Time   IRON 109 05/23/2017 1153   TIBC 311 05/23/2017 1153   FERRITIN 670 (H) 05/23/2017 1153   IRONPCTSAT 35 05/23/2017 1153   SPEP negative for M spike.   RADIOGRAPHIC STUDIES: I have personally reviewed the radiological images as listed and agreed with the findings in the report. 01/19/2018 CXR FINDINGS: The lungs are clear and negative for focal airspace consolidation, pulmonary edema or suspicious pulmonary nodule. No pleural effusion or pneumothorax. Cardiac and mediastinal contours are within normal limits. Patient is status post median sternotomy with evidence of prior CABG including LIMA bypass. Trace linear scarring versus atelectasis in the lingula. No acute fracture or lytic or blastic osseous lesions. The visualized upper abdominal bowel gas pattern is unremarkable.  IMPRESSION: No active cardiopulmonary disease. ASSESSMENT & PLAN:  1. Anemia of chronic renal failure, stage 3b   2. CKD (chronic kidney disease) stage 4, GFR 15-29 ml/min (HCC)    #Labs reviewed and discussed with patient. Hemoglobin is below 10 today.  Proceed with Retacrit 20,000 unit today. Continue H&H every 4 weeks and proceed with Retacrit 20,000 if hemoglobin is less than 10. His hemoglobin has been around 10, I will continue current regimen.  #Chronic thrombocytopenia,  Previous work-up showed normal B12, folate, TSH.  Onset of thrombocytopenia in June 2019.  Questionable MDS.  Today's thrombocytopenia has  resolved.  Normal platelet counts.  Continue to monitor.  CKD, avoid nephrotoxins.  Continue follow-up with nephrology.  All questions were answered. The patient knows to call the clinic with any problems questions or concerns. Orders Placed This Encounter  Procedures  . Hemoglobin and Hematocrit, Blood    Standing Status:   Standing    Number of Occurrences:   2    Standing Expiration Date:   05/10/2020  . CBC with Differential/Platelet    Standing Status:   Future    Standing Expiration Date:   05/10/2020    Return of visit:  Monthly H&H +/- retacrit. Follow up in clinic in 3 months.     Earlie Server, MD, PhD  05/11/2019

## 2019-05-11 NOTE — Progress Notes (Signed)
Patient does not offer any problems today.  

## 2019-05-26 ENCOUNTER — Encounter: Payer: Self-pay | Admitting: Dermatology

## 2019-05-26 ENCOUNTER — Other Ambulatory Visit: Payer: Self-pay | Admitting: Internal Medicine

## 2019-05-26 DIAGNOSIS — I714 Abdominal aortic aneurysm, without rupture, unspecified: Secondary | ICD-10-CM

## 2019-05-26 DIAGNOSIS — I771 Stricture of artery: Secondary | ICD-10-CM

## 2019-05-26 NOTE — Telephone Encounter (Signed)
Can you please schedule this patient then contact him and let him know the date and time? Thank you!

## 2019-06-03 ENCOUNTER — Other Ambulatory Visit: Payer: Self-pay

## 2019-06-03 ENCOUNTER — Ambulatory Visit (INDEPENDENT_AMBULATORY_CARE_PROVIDER_SITE_OTHER): Payer: Medicare Other

## 2019-06-03 DIAGNOSIS — I771 Stricture of artery: Secondary | ICD-10-CM

## 2019-06-03 DIAGNOSIS — I714 Abdominal aortic aneurysm, without rupture, unspecified: Secondary | ICD-10-CM

## 2019-06-04 ENCOUNTER — Other Ambulatory Visit: Payer: Self-pay | Admitting: *Deleted

## 2019-06-04 ENCOUNTER — Encounter: Payer: Self-pay | Admitting: Dermatology

## 2019-06-04 DIAGNOSIS — I714 Abdominal aortic aneurysm, without rupture, unspecified: Secondary | ICD-10-CM

## 2019-06-08 ENCOUNTER — Ambulatory Visit
Admission: EM | Admit: 2019-06-08 | Discharge: 2019-06-08 | Disposition: A | Discharge status: Home / Self Care | Payer: Medicare Other | Attending: Emergency Medicine | Admitting: Emergency Medicine

## 2019-06-08 ENCOUNTER — Other Ambulatory Visit: Payer: Self-pay

## 2019-06-08 DIAGNOSIS — R531 Weakness: Secondary | ICD-10-CM | POA: Diagnosis not present

## 2019-06-08 DIAGNOSIS — R42 Dizziness and giddiness: Secondary | ICD-10-CM

## 2019-06-08 LAB — POCT URINALYSIS DIP (MANUAL ENTRY)
Bilirubin, UA: NEGATIVE
Blood, UA: NEGATIVE
Glucose, UA: NEGATIVE mg/dL
Ketones, POC UA: NEGATIVE mg/dL
Leukocytes, UA: NEGATIVE
Nitrite, UA: NEGATIVE
Protein Ur, POC: NEGATIVE mg/dL
Spec Grav, UA: 1.015 (ref 1.010–1.025)
Urobilinogen, UA: 0.2 E.U./dL
pH, UA: 5 (ref 5.0–8.0)

## 2019-06-08 LAB — POCT FASTING CBG KUC MANUAL ENTRY: POCT Glucose (KUC): 106 mg/dL — AB (ref 70–99)

## 2019-06-08 NOTE — Discharge Instructions (Signed)
Your urine does not show signs of infection.   Your blood work will be back tomorrow morning.  I will call you with the results.   Your EKG looks similar to the one you had done in January.  Your rapid COVID is negative.  The send-out COVID will be back in 2-4 days.    Call your primary care provider tomorrow morning to schedule an appointment for tomorrow.    Go to the emergency department if you have dizziness, focal weakness, chest pain, shortness of breath, or other concerning symptoms.

## 2019-06-08 NOTE — Telephone Encounter (Signed)
Hey can you try and contact the patient one more time. If he doesn't answer we'll ask Lattie Haw to send a letter.

## 2019-06-08 NOTE — ED Provider Notes (Signed)
Tommy [name]    CSN: [id number] Arrival date & time: 06/08/19  1653      History   Chief Complaint Chief Complaint  Patient presents with  . Dizziness    HPI Tommy [name] is a 86 y.o. male.   Accompanied by his son, patient presents with an episode of dizziness, blurred vision, weakness which occurred this morning.  He states he took his blood sugar at that time and it was 73; this was a fasting blood sugar; he drank juice and began to feel better.  Last A1c 7.6 on 04/20/2019.  He reports ongoing generalized weakness but states the other symptoms have resolved.  He had 2 episodes of diarrhea this morning but states this is not unusual for him.  He also reports mild shortness of breath with exertion but states this is his baseline.  He denies focal weakness, facial asymmetry, speech difficulty, headache, fever, chills, sore throat, chest pain, cough, vomiting, rash, or other symptoms.  The history is provided by the patient and a relative.    Past Medical History:  Diagnosis Date  . Anemia   . Carotid artery occlusion    a. 2004 s/p L carotid endarterectomy; b. 02/2019 U/S: 1-39% bilat ICA stenosis. <50% bilat CCA stenosis.  . CKD (chronic kidney disease) stage 3, GFR 30-59 ml/min   . Coronary artery disease    a. 1994 s/p CABG x 3, [place], [place] (LIMA->LAD, VG->dRCA, VG->unknown vessel); b. 02/2016 Cath: LM 99d, LAD 100ost, LCX 100ost, RCA 80ost, diffuse 85-25m LIMA->LAD ok, VG->RCA ok. VG->unknown vessel 100.  . Diabetes mellitus without complication ([place]   . Dyslipidemia   . GERD (gastroesophageal reflux disease)   . Hemorrhoids   . 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 History of echocardiogram    a. 02/2017 Echo: EF 55-65%, no rwma. Nl DD. Ao sclerosis w/o stenosis. Mod TR. Mild MR.  .Marland KitchenHypertension   . Pulmonary embolism ([facility]   . Thyroid disease     Patient Active Problem List   Diagnosis Date Noted  . First degree AV block 12/24/2018  .  Coronary artery disease of native heart with stable angina pectoris ([facility] 03/18/2018  . Bilateral carotid artery stenosis 09/17/2017  . Anemia of chronic disease 09/17/2017  . Anemia of chronic renal failure, stage 3 (moderate) 05/23/2017  . Aortic valve sclerosis 02/04/2017  . Carotid artery disease ([place] 02/04/2017  . H/O endarterectomy 02/04/2017  . Hyperlipidemia LDL goal <70 02/04/2017  . Essential hypertension 02/04/2017  . GERD (gastroesophageal reflux disease) 12/10/2016  . Stage 3 chronic kidney disease 12/09/2016  . Coronary artery disease of native artery of native heart with stable angina pectoris ([facility] 12/09/2016  . Type 2 diabetes mellitus ([facility] 12/09/2016  . Hypothyroidism 12/09/2016    Past Surgical History:  Procedure Laterality Date  . CAROTID ENDARTERECTOMY Left 2004  . CORONARY ARTERY BYPASS GRAFT  1994   [name]  . TONSILLECTOMY AND ADENOIDECTOMY         Home Medications    Prior to Admission medications   Medication Sig Start Date End Date Taking? Authorizing Provider  aspirin 81 MG tablet Take 81 mg by mouth every evening.     [provider]  atorvastatin (LIPITOR) 40 MG tablet TAKE 1 TABLET BY MOUTH  DAILY 04/26/19   CPleas Koch NP  betamethasone valerate (VALISONE) 0.1 % cream APPLY TO AFFECTED AREA TWICE A DAY 04/02/19   CPleas Koch NP  Blood Glucose Monitoring Suppl ([facility]  VERIO) w/Device KIT 1 Device by Does not apply route daily. Use as instructed to test blood sugar daily 04/22/17   Pleas Koch, NP  CALCIUM PO Take 600 mg by mouth every morning.     [provider]  carvedilol (COREG) 6.25 MG tablet TAKE 1 TABLET BY MOUTH  TWICE DAILY WITH MEALS 04/26/19   Pleas Koch, NP  cholecalciferol (VITAMIN D3) 25 MCG (1000 UT) tablet Take 1,000 Units by mouth daily.    [provider]  epoetin alfa (EPOGEN,PROCRIT) 36629 UNIT/ML injection 10,000 Units. Every 2 to 4 weeks    [provider]  furosemide (LASIX) 40 MG tablet TAKE 1 TABLET BY MOUTH  DAILY 04/15/19   Pleas Koch, NP  glimepiride (AMARYL) 4 MG tablet TAKE 1 TABLET BY MOUTH  DAILY WITH BREAKFAST 02/24/19   Pleas Koch, NP  Hydrocortisone (PROCTOSOL HC RE) Place rectally as needed.    [provider]  Insulin Glargine (LANTUS) 100 UNIT/ML Solostar Pen Inject 10 Units into the skin daily.    [provider]  Insulin Pen Needle 29G X 12.7MM MISC BD Ultrafine Pen Needle Use as instructed to inject insulin daily 02/18/17   Pleas Koch, NP  IRON PO Take by mouth. 2 QAM 1 QPM    [provider]  levothyroxine (SYNTHROID) 175 MCG tablet TAKE 1 TABLET BY MOUTH IN  THE MORNING ON AN EMPTY  STOMACH WITH A FULL GLASS  OF WATER 02/24/19   Pleas Koch, NP  Multiple Vitamin (MULTIVITAMIN) capsule Take 1 capsule by mouth daily.    [provider]  nitroGLYCERIN (NITROSTAT) 0.4 MG SL tablet Place 1 tablet (0.4 mg total) under the tongue every 5 (five) minutes as needed for chest pain. 02/19/19 05/20/19  Theora Gianotti, NP  Omega-3 Fatty Acids (FISH OIL PO) Take 1,200 mg by mouth 4 (four) times daily.    [provider]  [facility] DELICA LANCETS 47M MISC USE AS INSTRUCT TO TEST BLOOD SUGAR ONCE DAILY 04/01/18   Pleas Koch, NP  [facility] VERIO test strip USE AS INSTRUCTED TO TEST BLOOD SUGAR ONCE DAILY 06/09/18   Pleas Koch, NP  pantoprazole (PROTONIX) 20 MG tablet TAKE 1 TABLET BY MOUTH  DAILY 04/26/19   Pleas Koch, NP  ranolazine (RANEXA) 1000 MG SR tablet Take 1 tablet (1,000 mg total) by mouth 2 (two) times daily. 02/19/19   Theora Gianotti, NP  timolol (TIMOPTIC) 0.5 % ophthalmic solution APPLY 1 DROP(S) IN BOTH EYES ONCE A DAY 03/17/17   [provider]    Family History Family History  Problem Relation Age of Onset  . Heart attack Mother   . Heart disease Mother   . Stroke Mother   . Diabetes Father    . Coronary artery disease Sister     Social History Social History   Tobacco Use  . Smoking status: Former Smoker    Packs/day: 1.50    Years: 25.00    Pack years: 37.50    Types: Cigarettes    Quit date: 1978    Years since quitting: 43.2  . Smokeless tobacco: Never Used  Substance Use Topics  . Alcohol use: Yes    Alcohol/week: 1.0 standard drinks    Types: 1 Glasses of wine per week    Comment: socially   . Drug use: No     Allergies   Heparin, Patiromer, and Sodium zirconium cyclosilicate   Review of Systems Review  of Systems  Constitutional: Negative for chills and fever.  HENT: Negative for ear pain and sore throat.   Eyes: Positive for visual disturbance. Negative for pain.  Respiratory: Positive for shortness of breath. Negative for cough.   Cardiovascular: Negative for chest pain and palpitations.  Gastrointestinal: Positive for diarrhea. Negative for abdominal pain, nausea and vomiting.  Genitourinary: Negative for dysuria and hematuria.  Musculoskeletal: Negative for arthralgias and back pain.  Skin: Negative for color change and rash.  Neurological: Positive for dizziness and weakness. Negative for seizures, syncope, facial asymmetry, speech difficulty, numbness and headaches.  All other systems reviewed and are negative.    Physical Exam Triage Vital Signs ED Triage Vitals  Enc Vitals Group     BP 06/08/19 1656 (!) 168/77     Pulse Rate 06/08/19 1656 60     Resp 06/08/19 1656 18     Temp --      Temp Source 06/08/19 1656 Oral     SpO2 06/08/19 1656 97 %     Weight 06/08/19 1654 165 lb (74.8 kg)     Height --      Head Circumference --      Peak Flow --      Pain Score 06/08/19 1654 0     Pain Loc --      Pain Edu? --      Excl. in [place]? --    Orthostatic VS for the past 24 hrs:  BP- Lying Pulse- Lying BP- Sitting Pulse- Sitting BP- Standing at 0 minutes Pulse- Standing at 0 minutes  06/08/19 1711 144/71 60 173/71 65 151/79 65  06/08/19  1710 144/71 -- -- -- -- --    Updated Vital Signs BP (!) 168/77 (BP Location: Left Arm)   Pulse 60   Temp 97.8 F (36.6 C) (Oral)   Resp 18   Wt 165 lb (74.8 kg)   SpO2 97%   BMI 23.68 kg/m   Visual Acuity Right Eye Distance:   Left Eye Distance:   Bilateral Distance:    Right Eye Near:   Left Eye Near:    Bilateral Near:     Physical Exam Vitals and nursing note reviewed.  Constitutional:      General: He is not in acute distress.    Appearance: He is well-developed.     Comments: Frail elderly.   HENT:     Head: Normocephalic and atraumatic.     Right Ear: Tympanic membrane normal.     Left Ear: Tympanic membrane normal.     Nose: Nose normal.     Mouth/Throat:     Mouth: Mucous membranes are moist.     Pharynx: Oropharynx is clear.  Eyes:     Conjunctiva/sclera: Conjunctivae normal.  Cardiovascular:     Rate and Rhythm: Normal rate and regular rhythm.     Heart sounds: No murmur.  Pulmonary:     Effort: Pulmonary effort is normal. No respiratory distress.     Breath sounds: Normal breath sounds. No wheezing or rhonchi.  Abdominal:     General: Bowel sounds are normal.     Palpations: Abdomen is soft.     Tenderness: There is no abdominal tenderness. There is no guarding or rebound.  Musculoskeletal:     Cervical back: Neck supple.     Right lower leg: No edema.     Left lower leg: No edema.     Comments: Ambulatory without assistance.   Skin:    General: Skin  is warm and dry.     Findings: No rash.  Neurological:     General: No focal deficit present.     Mental Status: He is alert. Mental status is at baseline.     Sensory: No sensory deficit.     Motor: No weakness.     Coordination: Coordination normal.     Gait: Gait normal.  Psychiatric:        Mood and Affect: Mood normal.        Behavior: Behavior normal.      UC Treatments / Results  Labs (all labs ordered are listed, but only abnormal results are displayed) Labs Reviewed  CBC -  Abnormal; Notable for the following components:      Result Value   RBC 3.00 (*)    Hemoglobin 10.4 (*)    Hematocrit 30.0 (*)    MCV 100 (*)    MCH 34.7 (*)    All other components within normal limits   Narrative:    Performed at:  [facility] [address] [place], [place]  [id number] Lab Director: Rush Farmer MD, Phone:  [phone]  BASIC METABOLIC PANEL - Abnormal; Notable for the following components:   BUN 41 (*)    Creatinine, Ser 2.02 (*)    GFR calc non Af Amer 28 (*)    GFR calc Af Amer 33 (*)    Potassium 5.4 (*)    All other components within normal limits   Narrative:    Performed at:  [facility] [address] [place], [place]  [id number] Lab Director: Rush Farmer MD, Phone:  [phone]  POCT FASTING [facility] [place] - Abnormal; Notable for the following components:   POCT Glucose ([facility]) 106 (*)    All other components within normal limits  NOVEL CORONAVIRUS, NAA  POCT URINALYSIS DIP (MANUAL ENTRY)  POC SARS CORONAVIRUS 2 AG -  ED    EKG   Radiology No results found.  Procedures Procedures (including critical care time)  Medications Ordered in UC Medications - No data to display  Initial Impression / Assessment and Plan / UC Course  I have reviewed the triage vital signs and the nursing notes.  Pertinent labs & imaging results that were available during my care of the patient were reviewed by me and considered in my medical decision making (see chart for details).   Dizziness, Weakness.  EKG: bradycardia with right BBB, rate 59, no ST elevation, compared to previous done on 03/24/2019.  CBC, BMP results discussed with patient.  Urine normal.  Rapid COVID negative; PCR pending.  Instructed patient and his son to call his PCP in the morning to schedule an appointment for tomorrow.  Instructed them to go to the ED if the patient has dizziness, focal weakness, chest pain, shortness of breath, or other concerning symptoms.   They agree to plan of care.      Final Clinical Impressions(s) / UC Diagnoses   Final diagnoses:  Dizziness  Generalized weakness     Discharge Instructions     Your urine does not show signs of infection.   Your blood work will be back tomorrow morning.  I will call you with the results.   Your EKG looks similar to the one you had done in January.  Your rapid COVID is negative.  The send-out COVID will be back in 2-4 days.    Call your primary care provider tomorrow morning to schedule an appointment for tomorrow.  Go to the emergency department if you have dizziness, focal weakness, chest pain, shortness of breath, or other concerning symptoms.        ED Prescriptions    None     PDMP not reviewed this encounter.   Sharion Balloon, NP 06/09/19 0800

## 2019-06-08 NOTE — Telephone Encounter (Signed)
Called patient again today and left another VM for him to call the office if he would like to schedule an apt.

## 2019-06-08 NOTE — ED Triage Notes (Addendum)
Pt is here with dizziness that started this morning, pt states he is a diabetic & his sugar was 73 @ 10:15am, he had only eaten the night before. Pt complained of blurred vision when viewing his tablet.

## 2019-06-08 NOTE — Telephone Encounter (Signed)
Can you send this patient a letter? Thank you.

## 2019-06-09 ENCOUNTER — Ambulatory Visit (INDEPENDENT_AMBULATORY_CARE_PROVIDER_SITE_OTHER): Payer: Medicare Other | Admitting: Primary Care

## 2019-06-09 ENCOUNTER — Telehealth: Payer: Self-pay

## 2019-06-09 DIAGNOSIS — R531 Weakness: Secondary | ICD-10-CM | POA: Diagnosis not present

## 2019-06-09 DIAGNOSIS — I25118 Atherosclerotic heart disease of native coronary artery with other forms of angina pectoris: Secondary | ICD-10-CM | POA: Diagnosis not present

## 2019-06-09 DIAGNOSIS — E119 Type 2 diabetes mellitus without complications: Secondary | ICD-10-CM

## 2019-06-09 DIAGNOSIS — Z794 Long term (current) use of insulin: Secondary | ICD-10-CM | POA: Diagnosis not present

## 2019-06-09 LAB — BASIC METABOLIC PANEL
BUN/Creatinine Ratio: 20 (ref 10–24)
BUN: 41 mg/dL — ABNORMAL HIGH (ref 10–36)
CO2: 22 mmol/L (ref 20–29)
Calcium: 9.8 mg/dL (ref 8.6–10.2)
Chloride: 100 mmol/L (ref 96–106)
Creatinine, Ser: 2.02 mg/dL — ABNORMAL HIGH (ref 0.76–1.27)
GFR calc Af Amer: 33 mL/min/{1.73_m2} — ABNORMAL LOW (ref >59)
GFR calc non Af Amer: 28 mL/min/{1.73_m2} — ABNORMAL LOW (ref >59)
Glucose: 95 mg/dL (ref 65–99)
Potassium: 5.4 mmol/L — ABNORMAL HIGH (ref 3.5–5.2)
Sodium: 135 mmol/L (ref 134–144)

## 2019-06-09 LAB — CBC
Hematocrit: 30 % — ABNORMAL LOW (ref 37.5–51.0)
Hemoglobin: 10.4 g/dL — ABNORMAL LOW (ref 13.0–17.7)
MCH: 34.7 pg — ABNORMAL HIGH (ref 26.6–33.0)
MCHC: 34.7 g/dL (ref 31.5–35.7)
MCV: 100 fL — ABNORMAL HIGH (ref 79–97)
Platelets: 171 10*3/uL (ref 150–450)
RBC: 3 x10E6/uL — ABNORMAL LOW (ref 4.14–5.80)
RDW: 12.7 % (ref 11.6–15.4)
WBC: 6.7 10*3/uL (ref 3.4–10.8)

## 2019-06-09 NOTE — Telephone Encounter (Signed)
Patient evaluated.  

## 2019-06-09 NOTE — Assessment & Plan Note (Signed)
Acute episode yesterday, blood glucose level of 73. Symptoms improved after juice.  Work up in urgent care without obvious cause for symptoms. Labs reviewed.  Discussed today that he is over treating his diabetes with both insulin and glimepiride. He will reduce Lantus back down to 10 units as prescribed. Discussed today the dangers of hypoglycemia, he verablized understanding, doesn't want to change his diabetes regimen.  Discussed that if he continues to notice drops in glucose below 100 then we will need to discontinue part of his regimen. Repeat A1C in mid May.

## 2019-06-09 NOTE — Telephone Encounter (Signed)
Raquel Sarna scheduled appt for pt this morning with Gentry Fitz NP at 11:20. FYI to Gentry Fitz NP.

## 2019-06-09 NOTE — Assessment & Plan Note (Signed)
Recent drop in glucose to 73 with symptoms of weakness, blurred vision, dizziness.   Discussed today that he is over treating his diabetes with both insulin and glimepiride. He will reduce Lantus back down to 10 units as prescribed. Discussed today the dangers of hypoglycemia, he verablized understanding, doesn't want to change his diabetes regimen.  Discussed that if he continues to notice drops in glucose below 100 then we will need to discontinue part of his regimen. Repeat A1C in mid May.

## 2019-06-09 NOTE — Patient Instructions (Addendum)
Continue to monitor your blood sugars, notify me if you continue to experience blood sugar drops below 100 with those symptoms.  Schedule a lab only appointment for on or after May 16th for repeat A1C check.  We will see you in August as scheduled.  It was a pleasure to see you today!   Hypoglycemia Hypoglycemia occurs when the level of sugar (glucose) in the blood is too low. Hypoglycemia can happen in people who do or do not have diabetes. It can develop quickly, and it can be a medical emergency. For most people with diabetes, a blood glucose level below 70 mg/dL (3.9 mmol/L) is considered hypoglycemia. Glucose is a type of sugar that provides the body's main source of energy. Certain hormones (insulin and glucagon) control the level of glucose in the blood. Insulin lowers blood glucose, and glucagon raises blood glucose. Hypoglycemia can result from having too much insulin in the bloodstream, or from not eating enough food that contains glucose. You may also have reactive hypoglycemia, which happens within 4 hours after eating a meal. What are the causes? Hypoglycemia occurs most often in people who have diabetes and may be caused by:  Diabetes medicine.  Not eating enough, or not eating often enough.  Increased physical activity.  Drinking alcohol on an empty stomach. If you do not have diabetes, hypoglycemia may be caused by:  A tumor in the pancreas.  Not eating enough, or not eating for long periods at a time (fasting).  A severe infection or illness.  Certain medicines. What increases the risk? Hypoglycemia is more likely to develop in:  People who have diabetes and take medicines to lower blood glucose.  People who abuse alcohol.  People who have a severe illness. What are the signs or symptoms? Mild symptoms Mild hypoglycemia may not cause any symptoms. If you do have symptoms, they may include:  Hunger.  Anxiety.  Sweating and feeling clammy.  Dizziness or  feeling light-headed.  Sleepiness.  Nausea.  Increased heart rate.  Headache.  Blurry vision.  Irritability.  Tingling or numbness around the mouth, lips, or tongue.  A change in coordination.  Restless sleep. Moderate symptoms Moderate hypoglycemia can cause:  Mental confusion and poor judgment.  Behavior changes.  Weakness.  Irregular heartbeat. Severe symptoms Severe hypoglycemia is a medical emergency. It can cause:  Fainting.  Seizures.  Loss of consciousness (coma).  Death. How is this diagnosed? Hypoglycemia is diagnosed with a blood test to measure your blood glucose level. This blood test is done while you are having symptoms. Your health care provider may also do a physical exam and review your medical history. How is this treated? This condition can often be treated by immediately eating or drinking something that contains sugar, such as:  Fruit juice, 4-6 oz (120-150 mL).  Regular soda (not diet soda), 4-6 oz (120-150 mL).  Low-fat milk, 4 oz (120 mL).  Several pieces of hard candy.  Sugar or honey, 1 Tbsp (15 mL). Treating hypoglycemia if you have diabetes If you are alert and able to swallow safely, follow the 15:15 rule:  Take 15 grams of a rapid-acting carbohydrate. Talk with your health care provider about how much you should take.  Rapid-acting options include: ? Glucose pills (take 15 grams). ? 6-8 pieces of hard candy. ? 4-6 oz (120-150 mL) of fruit juice. ? 4-6 oz (120-150 mL) of regular (not diet) soda. ? 1 Tbsp (15 mL) honey or sugar.  Check your blood glucose 15  minutes after you take the carbohydrate.  If the repeat blood glucose level is still at or below 70 mg/dL (3.9 mmol/L), take 15 grams of a carbohydrate again.  If your blood glucose level does not increase above 70 mg/dL (3.9 mmol/L) after 3 tries, seek emergency medical care.  After your blood glucose level returns to normal, eat a meal or a snack within 1  hour.  Treating severe hypoglycemia Severe hypoglycemia is when your blood glucose level is at or below 54 mg/dL (3 mmol/L). Severe hypoglycemia is a medical emergency. Get medical help right away. If you have severe hypoglycemia and you cannot eat or drink, you may need an injection of glucagon. A family member or close friend should learn how to check your blood glucose and how to give you a glucagon injection. Ask your health care provider if you need to have an emergency glucagon injection kit available. Severe hypoglycemia may need to be treated in a hospital. The treatment may include getting glucose through an IV. You may also need treatment for the cause of your hypoglycemia. Follow these instructions at home:  General instructions  Take over-the-counter and prescription medicines only as told by your health care provider.  Monitor your blood glucose as told by your health care provider.  Limit alcohol intake to no more than 1 drink a day for nonpregnant women and 2 drinks a day for men. One drink equals 12 oz of beer (355 mL), 5 oz of wine (148 mL), or 1 oz of hard liquor (44 mL).  Keep all follow-up visits as told by your health care provider. This is important. If you have diabetes:  Always have a rapid-acting carbohydrate snack with you to treat low blood glucose.  Follow your diabetes management plan as directed. Make sure you: ? Know the symptoms of hypoglycemia. It is important to treat it right away to prevent it from becoming severe. ? Take your medicines as directed. ? Follow your exercise plan. ? Follow your meal plan. Eat on time, and do not skip meals. ? Check your blood glucose as often as directed. Always check before and after exercise. ? Follow your sick day plan whenever you cannot eat or drink normally. Make this plan in advance with your health care provider.  Share your diabetes management plan with people in your workplace, school, and household.  Check  your urine for ketones when you are ill and as told by your health care provider.  Carry a medical alert card or wear medical alert jewelry. Contact a health care provider if:  You have problems keeping your blood glucose in your target range.  You have frequent episodes of hypoglycemia. Get help right away if:  You continue to have hypoglycemia symptoms after eating or drinking something containing glucose.  Your blood glucose is at or below 54 mg/dL (3 mmol/L).  You have a seizure.  You faint. These symptoms may represent a serious problem that is an emergency. Do not wait to see if the symptoms will go away. Get medical help right away. Call your local emergency services (911 in the [place] Summary  Hypoglycemia occurs when the level of sugar (glucose) in the blood is too low.  Hypoglycemia can happen in people who do or do not have diabetes. It can develop quickly, and it can be a medical emergency.  Make sure you know the symptoms of hypoglycemia and how to treat it.  Always have a rapid-acting carbohydrate snack with you to treat  low blood sugar. This information is not intended to replace advice given to you by your health care provider. Make sure you discuss any questions you have with your health care provider. Document Revised: 08/11/2017 Document Reviewed: 03/24/2015 Elsevier Patient Education  2020 [employer].

## 2019-06-09 NOTE — Telephone Encounter (Signed)
Patient has appointment today at 11:20

## 2019-06-09 NOTE — Progress Notes (Signed)
Subjective:    Patient ID: Tommy [name], male    DOB: 08/17/1926, 86 y.o.   MRN: [id number]  HPI  This visit occurred during the SARS-CoV-2 public health emergency.  Safety protocols were in place, including screening questions prior to the visit, additional usage of staff PPE, and extensive cleaning of exam room while observing appropriate contact time as indicated for disinfecting solutions.   Tommy [name] is a 86 year old male with a history of CAD, Carotid artery disease, hypertension, type 2 diabetes, hypothyroidism, CKD stage II, anemia who presents today for UC follow up.  He presented to [facility] yesterday with acute dizziness, blurred vision, weakness that occurred earlier that day. He checked his blood glucose level which was 73, felt better after drinking apple juice.   During his urgent care stay he underwent ECG which did not show acute findings, HR of 59. Labs including CBC, BMP, Urinalysis without acute changes. Negative Covid-19 test.   Today he's feeling better. Symptoms yesterday gradually improved after drinking apple juice. He is checking his glucose at home which range's 80-160, he does see readings below 100 often. He's recently reduced his intake of potatoes and fruit in an attempt to lower potassium.   He is managed on Lantus 10 units and glimepiride 4 mg. We've discussed reducing his dose of Lantus and stopping his glimepiride in the past but he kindly declined. He's actually increased his Lantus recently to 11 units. A1C of 7.6 in February 2021.   Review of Systems  Eyes: Negative for visual disturbance.  Respiratory: Negative for shortness of breath.   Cardiovascular: Negative for chest pain.  Neurological: Negative for dizziness and weakness.       Past Medical History:  Diagnosis Date  . Anemia   . Carotid artery occlusion    a. 2004 s/p L carotid endarterectomy; b. 02/2019 U/S: 1-39% bilat ICA stenosis. <50% bilat CCA stenosis.  . CKD  (chronic kidney disease) stage 3, GFR 30-59 ml/min   . Coronary artery disease    a. 1994 s/p CABG x 3, [place], [place] (LIMA->LAD, VG->dRCA, VG->unknown vessel); b. 02/2016 Cath: LM 99d, LAD 100ost, LCX 100ost, RCA 80ost, diffuse 85-55m LIMA->LAD ok, VG->RCA ok. VG->unknown vessel 100.  . Diabetes mellitus [place] complication [place]   . Dyslipidemia   . GERD (gastroesophageal reflux disease)   . Hemorrhoids   . 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 History of echocardiogram    a. 02/2017 Echo: EF 55-65%, no rwma. Nl DD. Ao sclerosis w/o stenosis. Mod TR. Mild MR.  .Marland KitchenHypertension   . Pulmonary embolism ([place]   . Thyroid disease      Social History   Socioeconomic History  . Marital status: Widowed    Spouse name: Not on file  . Number of children: 4  . Years of education: 16  . Highest education level: Bachelor's degree (e.g., BA, AB, BS)  Occupational History  . Occupation: retired  Tobacco Use  . Smoking status: Former Smoker    Packs/day: 1.50    Years: 25.00    Pack years: 37.50    Types: Cigarettes    Quit date: 1978    Years since quitting: 43.2  . Smokeless tobacco: Never Used  Substance and Sexual Activity  . Alcohol use: Yes    Alcohol/week: 1.0 standard drinks    Types: 1 Glasses of wine per week    Comment: socially   . Drug use: No  . Sexual activity: Not Currently  Other Topics Concern  . Not on file  Social History Narrative   Widower.   4 children, 7 grandchildren, 1 great grandchild.   Retired. Once worked as an account.    Enjoys exercising, spending time with family.    Social Determinants of Health   Financial Resource Strain:   . Difficulty of Paying Living Expenses:   Food Insecurity:   . Worried About Charity fundraiser in the Last Year:   . Arboriculturist in the Last Year:   Transportation Needs:   . Film/video editor (Medical):   Marland Kitchen Lack of Transportation (Non-Medical):   Physical Activity:   . Days of Exercise per Week:    . Minutes of Exercise per Session:   Stress:   . Feeling of Stress :   Social Connections:   . Frequency of Communication with Friends and Family:   . Frequency of Social Gatherings with Friends and Family:   . Attends Religious Services:   . Active Member of Clubs or Organizations:   . Attends Archivist Meetings:   Marland Kitchen Marital Status:   Intimate Partner Violence:   . Fear of Current or Ex-Partner:   . Emotionally Abused:   Marland Kitchen Physically Abused:   . Sexually Abused:     Past Surgical History:  Procedure Laterality Date  . CAROTID ENDARTERECTOMY Left 2004  . CORONARY ARTERY BYPASS GRAFT  1994   [name]  . TONSILLECTOMY AND ADENOIDECTOMY      Family History  Problem Relation Age of Onset  . Heart attack Mother   . Heart disease Mother   . Stroke Mother   . Diabetes Father   . Coronary artery disease Sister     Allergies  Allergen Reactions  . Heparin Other (See Comments)  . Patiromer Other (See Comments)  . Sodium Zirconium Cyclosilicate Other (See Comments)    Current Outpatient Medications on File Prior to Visit  Medication Sig Dispense Refill  . aspirin 81 MG tablet Take 81 mg by mouth every evening.     Marland Kitchen atorvastatin (LIPITOR) 40 MG tablet TAKE 1 TABLET BY MOUTH  DAILY 90 tablet 1  . betamethasone valerate (VALISONE) 0.1 % cream APPLY TO AFFECTED AREA TWICE A DAY 30 g 0  . Blood Glucose Monitoring Suppl (ONETOUCH VERIO) w/Device KIT 1 Device by Does not apply route daily. Use as instructed to test blood sugar daily 1 kit 0  . CALCIUM PO Take 600 mg by mouth every morning.     . carvedilol (COREG) 6.25 MG tablet TAKE 1 TABLET BY MOUTH  TWICE DAILY WITH MEALS 180 tablet 1  . cholecalciferol (VITAMIN D3) 25 MCG (1000 UT) tablet Take 1,000 Units by mouth daily.    Marland Kitchen epoetin alfa (EPOGEN,PROCRIT) 46962 UNIT/ML injection 10,000 Units. Every 2 to 4 weeks    . furosemide (LASIX) 40 MG tablet TAKE 1 TABLET BY MOUTH  DAILY 90 tablet 1  . glimepiride  (AMARYL) 4 MG tablet TAKE 1 TABLET BY MOUTH  DAILY WITH BREAKFAST 90 tablet 1  . Hydrocortisone (PROCTOSOL HC RE) Place rectally as needed.    . Insulin Glargine (LANTUS) 100 UNIT/ML Solostar Pen Inject 10 Units into the skin daily.    . Insulin Pen Needle 29G X 12.7MM MISC BD Ultrafine Pen Needle Use as instructed to inject insulin daily 300 each 1  . IRON PO Take by mouth. 2 QAM 1 QPM    . levothyroxine (SYNTHROID) 175 MCG tablet TAKE 1 TABLET  BY MOUTH IN  THE MORNING ON AN EMPTY  STOMACH WITH A FULL GLASS  OF WATER 90 tablet 1  . Multiple Vitamin (MULTIVITAMIN) capsule Take 1 capsule by mouth daily.    . Omega-3 Fatty Acids (FISH OIL PO) Take 1,200 mg by mouth 4 (four) times daily.    Glory Rosebush DELICA LANCETS 15B MISC USE AS INSTRUCT TO TEST BLOOD SUGAR ONCE DAILY 100 each 4  . ONETOUCH VERIO test strip USE AS INSTRUCTED TO TEST BLOOD SUGAR ONCE DAILY 100 each 5  . pantoprazole (PROTONIX) 20 MG tablet TAKE 1 TABLET BY MOUTH  DAILY 90 tablet 1  . ranolazine (RANEXA) 1000 MG SR tablet Take 1 tablet (1,000 mg total) by mouth 2 (two) times daily. 180 tablet 3  . timolol (TIMOPTIC) 0.5 % ophthalmic solution APPLY 1 DROP(S) IN BOTH EYES ONCE A DAY  11  . nitroGLYCERIN (NITROSTAT) 0.4 MG SL tablet Place 1 tablet (0.4 mg total) under the tongue every 5 (five) minutes as needed for chest pain. 25 tablet 3   No current facility-administered medications on file prior to visit.    BP 136/86   Pulse 62   Temp (!) 96.1 F (35.6 C) (Temporal)   Ht '5\' 10"'$  (1.778 m)   Wt 157 lb 12 oz (71.6 kg)   SpO2 98%   BMI 22.63 kg/m    Objective:   Physical Exam  Constitutional: He is oriented to person, place, and time. He appears well-nourished.  Cardiovascular: Normal rate and regular rhythm.  Respiratory: Effort normal and breath sounds normal. He has no wheezes.  Neurological: He is alert and oriented to person, place, and time.  Skin: Skin is warm and dry.           Assessment & Plan:

## 2019-06-09 NOTE — Telephone Encounter (Signed)
[place] Day - Client TELEPHONE ADVICE RECORD [facility] Patient Name: Tommy [name] Gender: Male DOB: 1926/12/11 Age: 86 Y 74 M 5 D Return Phone Number: [phone] (Primary) Address: City/State/Zip: [place] [address] Client [place] [facility] - Client Client Site [place] - Day Physician Alma Friendly - NP Contact Type Call Who Is Calling Patient / Member / Family / Caregiver Call Type Triage / Clinical Relationship To Patient Self Return Phone Number [phone] (Primary) Chief Complaint Dizziness Reason for Call Symptomatic / Request for [place] stated he is dizzy and weak Translation No Nurse Assessment Nurse: Marcelline Deist, RN, Mickel Baas Date/Time Eilene Ghazi Time): 06/08/2019 4:18:19 PM Confirm and document reason for call. If symptomatic, describe symptoms. ---Caller stated he is dizzy and weak. BG 73 this morning and he ate breakfast and felt better. States he is still weak. Dizziness is better. Has the patient had close contact with a person known or suspected to have the novel coronavirus illness OR traveled / lives in area with major community spread (including international travel) in the last 14 days from the onset of symptoms? * If Asymptomatic, screen for exposure and travel within the last 14 days. ---No Does the patient have any new or worsening symptoms? ---Yes Will a triage be completed? ---Yes Related visit to physician within the last 2 weeks? ---No Does the PT have any chronic conditions? (i.e. diabetes, asthma, this includes High risk factors for pregnancy, etc.) ---Yes List chronic conditions. ---Diabetes, HTN, Stage 3 kidney disease Is this a behavioral health or substance abuse call? ---No Guidelines Guideline Title Affirmed Question Affirmed Notes Nurse Date/Time (Eastern Time) Weakness (Generalized) and Fatigue [1] MODERATE weakness (i.e.,  interferes with work, school, normal activities) AND [2] cause unknown (Exceptions: weakness with acute minor illness, Tommy [name] 06/08/2019 4:21:05 PM PLEASE NOTE: All timestamps contained within this report are represented as [place] Standard Time. CONFIDENTIALTY NOTICE: This fax transmission is intended only for the addressee. It contains information that is legally privileged, confidential or otherwise protected from use or disclosure. If you are not the intended recipient, you are strictly prohibited from reviewing, disclosing, copying using or disseminating any of this information or taking any action in reliance on or regarding this information. If you have received this fax in error, please notify us immediately by telephone so that we can arrange for its return to [place]. Phone: [phone], Toll-Free: [phone], Fax: [phone] Page: 2 of 2 Call Id: [id number] Guidelines Guideline Title Affirmed Question Affirmed Notes Nurse Date/Time Eilene Ghazi Time) or weakness from poor fluid intake) Disp. Time Eilene Ghazi Time) Disposition Final User 06/08/2019 4:25:09 PM See HCP within 4 Hours (or PCP triage) Yes Marcelline Deist, RN, Elige Radon Disagree/Comply Comply Caller Understands Yes PreDisposition [place] Advice Given Per Guideline SEE HCP WITHIN 4 HOURS (OR PCP TRIAGE): CALL BACK IF: * You become worse. CARE ADVICE given per Weakness and Fatigue (Adult) guideline. Referrals REFERRED TO PCP OFFICE

## 2019-06-10 LAB — SARS-COV-2, NAA 2 DAY TAT

## 2019-06-10 LAB — NOVEL CORONAVIRUS, NAA: SARS-CoV-2, NAA: NOT DETECTED

## 2019-06-10 NOTE — Progress Notes (Signed)
I left a message for this patient to call me back to discuss his lab results.  He did not call me.  He was supposed to f/u with his PCP the next day.

## 2019-06-10 NOTE — Progress Notes (Signed)
His record indicates that he saw his PCP that next day and the PCP reviewed the labs with him.

## 2019-06-11 ENCOUNTER — Ambulatory Visit: Payer: Medicare Other | Admitting: Primary Care

## 2019-06-11 ENCOUNTER — Inpatient Hospital Stay: Payer: Medicare Other | Attending: Oncology

## 2019-06-11 ENCOUNTER — Inpatient Hospital Stay: Payer: Medicare Other

## 2019-06-11 ENCOUNTER — Other Ambulatory Visit: Payer: Self-pay

## 2019-06-11 VITALS — BP 147/66 | HR 53

## 2019-06-11 DIAGNOSIS — Z79899 Other long term (current) drug therapy: Secondary | ICD-10-CM | POA: Diagnosis not present

## 2019-06-11 DIAGNOSIS — D631 Anemia in chronic kidney disease: Secondary | ICD-10-CM | POA: Diagnosis not present

## 2019-06-11 DIAGNOSIS — N183 Chronic kidney disease, stage 3 unspecified: Secondary | ICD-10-CM | POA: Insufficient documentation

## 2019-06-11 DIAGNOSIS — N184 Chronic kidney disease, stage 4 (severe): Secondary | ICD-10-CM

## 2019-06-11 DIAGNOSIS — I129 Hypertensive chronic kidney disease with stage 1 through stage 4 chronic kidney disease, or unspecified chronic kidney disease: Secondary | ICD-10-CM | POA: Insufficient documentation

## 2019-06-11 LAB — HEMOGLOBIN AND HEMATOCRIT, BLOOD
HCT: 30 % — ABNORMAL LOW (ref 39.0–52.0)
Hemoglobin: 9.9 g/dL — ABNORMAL LOW (ref 13.0–17.0)

## 2019-06-11 MED ORDER — EPOETIN ALFA-EPBX 10000 UNIT/ML IJ SOLN
20000.0000 [IU] | Freq: Once | INTRAMUSCULAR | Status: AC
  Administered 2019-06-11: 20000 [IU] via SUBCUTANEOUS
  Filled 2019-06-11: qty 2

## 2019-06-16 ENCOUNTER — Other Ambulatory Visit: Payer: Self-pay | Admitting: Primary Care

## 2019-06-16 DIAGNOSIS — E039 Hypothyroidism, unspecified: Secondary | ICD-10-CM

## 2019-06-23 ENCOUNTER — Other Ambulatory Visit: Payer: Self-pay

## 2019-06-23 ENCOUNTER — Encounter: Payer: Self-pay | Admitting: Internal Medicine

## 2019-06-23 ENCOUNTER — Ambulatory Visit (INDEPENDENT_AMBULATORY_CARE_PROVIDER_SITE_OTHER): Payer: Medicare Other | Admitting: Internal Medicine

## 2019-06-23 VITALS — BP 128/62 | HR 61 | Ht 70.0 in | Wt 160.2 lb

## 2019-06-23 DIAGNOSIS — I4510 Unspecified right bundle-branch block: Secondary | ICD-10-CM | POA: Diagnosis not present

## 2019-06-23 DIAGNOSIS — I739 Peripheral vascular disease, unspecified: Secondary | ICD-10-CM

## 2019-06-23 DIAGNOSIS — I10 Essential (primary) hypertension: Secondary | ICD-10-CM | POA: Diagnosis not present

## 2019-06-23 DIAGNOSIS — I25118 Atherosclerotic heart disease of native coronary artery with other forms of angina pectoris: Secondary | ICD-10-CM

## 2019-06-23 DIAGNOSIS — I440 Atrioventricular block, first degree: Secondary | ICD-10-CM | POA: Diagnosis not present

## 2019-06-23 NOTE — Progress Notes (Signed)
Follow-up Outpatient Visit Date: 06/23/2019  Primary Care Provider: Pleas Koch, NP [address] [place] [address]  Chief Complaint: Follow-up coronary artery disease  HPI:  Tommy [name] is a 86 y.o. male with history of  coronary artery disease status post remote CABG with stable angina, PAD, carotid artery stenosis status post left CEA, hypertension, hyperlipidemia, type 2 diabetes mellitus,CKD, and chronic anemia, who presents for follow-up of coronary peripheral vascular disease.  He was last seen in our office in January by Ignacia Bayley, NP.  He had noted nocturnal angina over the preceding few months.  He has been intolerant of long-acting nitrates and amlodipine in the past.  Ranolazine has been increased to 1000 mg twice daily with resultant improvement in his nocturnal angina.  He was seen at urgent care earlier this month with dizziness and generalized weakness.  Blood sugar was noted to be borderline low and this setting, with symptoms improving with orange juice.  Today, Tommy [name] reports that he is feeling better.  He has not had any further episodes of chest pain for several months.  He has not needed any sublingual nitroglycerin.  He has been trying to limit potassium containing foods, given history of hyperkalemia.  He is still exercising regularly without limitations.  Interestingly, he notes occasional transient shortness of breath when getting up to use the bathroom at night.  He has not had any palpitations, lightheadedness, edema, or orthopnea.  --------------------------------------------------------------------------------------------------  Cardiovascular History & Procedures: Cardiovascular Problems:  Coronary artery disease status post CABG (1994)  Carotid artery stenosis status post left carotid endarterectomy (2004)  Abdominal aortic aneurysm  Risk Factors:  Known coronary artery disease, hypertension, hyperlipidemia, diabetes mellitus, male  gender, and age  Cath/PCI:  LHC (02/12/16): Severe native disease with chronic total occlusions of the ostial LAD and LCx as well as 99% stenosis of distal LMCA. 80% ostial RCA stenosis as well as diffuse mid RCA with tandem lesions of 85-99%. Patent LIMA to LAD and SVG to distal RCA. Additional SVG to unknown vessel is chronically occluded.  CV Surgery:  CABG (1994, [place], [place]): LIMA to LAD, SVG to distal RCA, and SVG to other unknown vessel.  Left carotid endarterectomy (2004)  EP Procedures and Devices:  None  Non-Invasive Evaluation(s):  Aortoiliac duplex (06/03/2019): Mild abdominal aortic dilation, measuring up to 3.4 cm.  Carotid Doppler (02/08/2019): 1-39% stenosis in both internal carotid arteries.  Left carotid endarterectomy site patent without significant narrowing.  Antegrade vertebral artery flow bilaterally.  Right subclavian artery flow suggested stenosis.  TTE (02/06/17): Normal LV size. LVEF 55-60% with normal wall motion. Normal diastolic function. Aortic sclerosis without stenosis. Mild MR. Mild left atrial enlargement. Moderate TR. Upper normal to mildly elevated pulmonary artery pressure.  Carotid Doppler (02/06/17): Mild narrowing of bilateral internal carotid arteries (less than 40%. Less than 50% stenosis involving the right common carotid artery. Antegrade vertebral artery flow bilaterally. Normal flow in the subclavian arteries bilaterally.  Carotid artery Doppler (09/14/15): Moderate plaque in the right ICA with less than 50% stenosis.  Pharmacologic MPI (12/16/13): Mild inferior ischemia with LVEF of 58%.  TTE (12/02/13): Mild LVH with LVEF of 65% and grade 1 diastolic dysfunction. Aortic sclerosis, mild TR, and moderate pulmonary hypertension. Question PFO.  Aortoiliac ultrasound (08/09/13): No evidence of AAA. Moderately elevated right common iliac artery velocity with 50-75% stenosis.  Recent CV Pertinent Labs: Lab Results    Component Value Date   CHOL 129 04/20/2019   HDL 44.50  04/20/2019   LDLCALC 50 04/20/2019   TRIG 176.0 (H) 04/20/2019   CHOLHDL 3 04/20/2019   K 5.4 (H) 06/08/2019   BUN 41 (H) 06/08/2019   CREATININE 2.02 (H) 06/08/2019    Past medical and surgical history were reviewed and updated in EPIC.  Current Meds  Medication Sig  . aspirin 81 MG tablet Take 81 mg by mouth every evening.   Marland Kitchen atorvastatin (LIPITOR) 40 MG tablet TAKE 1 TABLET BY MOUTH  DAILY  . betamethasone valerate (VALISONE) 0.1 % cream APPLY TO AFFECTED AREA TWICE A DAY  . Blood Glucose Monitoring Suppl (ONETOUCH VERIO) w/Device KIT 1 Device by Does not apply route daily. Use as instructed to test blood sugar daily  . CALCIUM PO Take 600 mg by mouth every morning.   . carvedilol (COREG) 6.25 MG tablet TAKE 1 TABLET BY MOUTH  TWICE DAILY WITH MEALS  . cholecalciferol (VITAMIN D3) 25 MCG (1000 UT) tablet Take 1,000 Units by mouth daily.  Marland Kitchen epoetin alfa (EPOGEN,PROCRIT) 59163 UNIT/ML injection 10,000 Units. Every 2 to 4 weeks  . furosemide (LASIX) 40 MG tablet TAKE 1 TABLET BY MOUTH  DAILY  . glimepiride (AMARYL) 4 MG tablet TAKE 1 TABLET BY MOUTH  DAILY WITH BREAKFAST  . Hydrocortisone (PROCTOSOL HC RE) Place rectally as needed.  . Insulin Glargine (LANTUS) 100 UNIT/ML Solostar Pen Inject 10 Units into the skin daily.  . Insulin Pen Needle 29G X 12.7MM MISC BD Ultrafine Pen Needle Use as instructed to inject insulin daily  . IRON PO Take by mouth. 2 QAM 1 QPM  . levothyroxine (SYNTHROID) 175 MCG tablet TAKE 1 TABLET BY MOUTH IN  THE MORNING ON AN EMPTY  STOMACH WITH A FULL GLASS  OF WATER  . Multiple Vitamin (MULTIVITAMIN) capsule Take 1 capsule by mouth daily.  . Omega-3 Fatty Acids (FISH OIL PO) Take 1,200 mg by mouth 4 (four) times daily.  Glory Rosebush DELICA LANCETS 84Y MISC USE AS INSTRUCT TO TEST BLOOD SUGAR ONCE DAILY  . ONETOUCH VERIO test strip USE AS INSTRUCTED TO TEST BLOOD SUGAR ONCE DAILY  . pantoprazole  (PROTONIX) 20 MG tablet TAKE 1 TABLET BY MOUTH  DAILY  . ranolazine (RANEXA) 1000 MG SR tablet Take 1 tablet (1,000 mg total) by mouth 2 (two) times daily.  . timolol (TIMOPTIC) 0.5 % ophthalmic solution APPLY 1 DROP(S) IN BOTH EYES ONCE A DAY    Allergies: Heparin, Patiromer, and Sodium zirconium cyclosilicate  Social History   Tobacco Use  . Smoking status: Former Smoker    Packs/day: 1.50    Years: 25.00    Pack years: 37.50    Types: Cigarettes    Quit date: 1978    Years since quitting: 43.3  . Smokeless tobacco: Never Used  Substance Use Topics  . Alcohol use: Yes    Alcohol/week: 1.0 standard drinks    Types: 1 Glasses of wine per week    Comment: socially   . Drug use: No    Family History  Problem Relation Age of Onset  . Heart attack Mother   . Heart disease Mother   . Stroke Mother   . Diabetes Father   . Coronary artery disease Sister     Review of Systems: A 12-system review of systems was performed and was negative except as noted in the HPI.  --------------------------------------------------------------------------------------------------  Physical Exam: BP 128/62 (BP Location: Left Arm, Patient Position: Sitting, Cuff Size: Normal)   Pulse 61   Ht _0  (1.778  m)   Wt 160 lb 4 oz (72.7 kg)   SpO2 98%   BMI 22.99 kg/m   General: NAD. Neck: No JVD or HJR. Lungs: Clear to auscultation bilaterally without wheezes or crackles. Heart: Regular rate and rhythm without murmurs, rubs, or gallops. Abdomen: Soft, nontender, nondistended. Extremities: No lower extremity edema.  EKG: Normal sinus rhythm with first-degree AV block, left axis deviation and right bundle branch block.  PR interval 266 ms.  QTc 432 ms.  PR interval is slightly longer compared to prior tracing from 03/24/2019.  Otherwise, no significant interval change.  Lab Results  Component Value Date   WBC 6.7 06/08/2019   HGB 9.9 (L) 06/11/2019   HCT 30.0 (L) 06/11/2019   MCV 100 (H)  06/08/2019   PLT 171 06/08/2019    Lab Results  Component Value Date   NA 135 06/08/2019   K 5.4 (H) 06/08/2019   CL 100 06/08/2019   CO2 22 06/08/2019   BUN 41 (H) 06/08/2019   CREATININE 2.02 (H) 06/08/2019   GLUCOSE 95 06/08/2019   ALT 22 04/20/2019    Lab Results  Component Value Date   CHOL 129 04/20/2019   HDL 44.50 04/20/2019   LDLCALC 50 04/20/2019   TRIG 176.0 (H) 04/20/2019   CHOLHDL 3 04/20/2019    --------------------------------------------------------------------------------------------------  ASSESSMENT AND PLAN: Coronary artery disease with stable angina: Overall, Tommy [name] is doing well without further angina on his current regimen of carvedilol and ranolazine.  Unfortunately, he has been intolerant of other antianginal therapy in the past including amlodipine and long-acting nitrates.  Carvedilol and ranolazine are less than ideal in the setting of Tommy [name] is conduction disease and chronic renal insufficiency, but given limited treatment options and good symptomatic relief at this time, we will defer medication changes.  We would like to avoid catheterization/PCI if at all possible due to other comorbidities, including age and chronic kidney disease.  Right bundle branch block, first-degree AV block, and weakness: Tommy [name] has a long history of right bundle branch block and first-degree AV block, though his PR interval appears slightly longer today compared to prior tracings.  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  Tommy [name] is at increased risk for development of complete heart block.  In light of his recent episode of generalized weakness, I think it might be worthwhile to place a 14-day event monitor to exclude transient high-grade AV block.  I would like to de-escalate his beta-blocker but I am reluctant to do so due to recurrent angina in the past that has  been difficult to manage with other agents.  PAD: Patient with history of aortoiliac and carotid disease.  He is asymptomatic.  Mild dilation of the abdominal aorta noted on recent ultrasound.  We will plan for follow-up scan in about a year.  Hypertension: Blood pressure well controlled today.  No medication changes at this time.  Hyperlipidemia: LDL is at goal.  We will continue atorvastatin 40 mg daily.  Follow-up: Return to clinic in 3 months.  Nelva Bush, MD 06/23/2019 3:02 PM

## 2019-06-23 NOTE — Patient Instructions (Addendum)
Medication Instructions:  Your physician recommends that you continue on your current medications as directed. Please refer to the Current Medication list given to you today.  *If you need a refill on your cardiac medications before your next appointment, please call your pharmacy*   Follow-Up: At [facility], you and your health needs are our priority.  As part of our continuing mission to provide you with exceptional heart care, we have created designated Provider Care Teams.  These Care Teams include your primary Cardiologist (physician) and Advanced Practice Providers (APPs -  Physician Assistants and Nurse Practitioners) who all work together to provide you with the care you need, when you need it.  We recommend signing up for the patient portal called "MyChart".  Sign up information is provided on this After Visit Summary.  MyChart is used to connect with patients for Virtual Visits (Telemedicine).  Patients are able to view lab/test results, encounter notes, upcoming appointments, etc.  Non-urgent messages can be sent to your provider as well.   To learn more about what you can do with MyChart, go to [email or web address].    Your next appointment:   3 month(s)  The format for your next appointment:   In Person  Provider:    You may see Nelva Bush, MD or one of the following Advanced Practice Providers on your designated Care Team:    Murray Hodgkins, NP  Christell Faith, PA-C  Marrianne Mood, PA-C

## 2019-06-24 ENCOUNTER — Telehealth: Payer: Self-pay | Admitting: Internal Medicine

## 2019-06-24 DIAGNOSIS — I444 Left anterior fascicular block: Secondary | ICD-10-CM

## 2019-06-24 DIAGNOSIS — R531 Weakness: Secondary | ICD-10-CM

## 2019-06-24 DIAGNOSIS — I453 Trifascicular block: Secondary | ICD-10-CM

## 2019-06-24 DIAGNOSIS — I4589 Other specified conduction disorders: Secondary | ICD-10-CM

## 2019-06-24 NOTE — Telephone Encounter (Signed)
Left voicemail for patient to call the [place] office back to discuss additional recommendations from yesterday's visit, specifically consideration of ambulatory cardiac monitoring given recent episode of weakness and conduction disease noted on yesterday's EKG.  Nelva Bush, MD [facility]

## 2019-06-29 NOTE — Telephone Encounter (Signed)
Patient's number went straight to VM. Left message to call back.  Attempted to reach son on [place]. No answer or VM after several rings.

## 2019-07-03 ENCOUNTER — Other Ambulatory Visit: Payer: Self-pay | Admitting: Primary Care

## 2019-07-06 NOTE — Telephone Encounter (Signed)
Spoke with patient and he is agreeable to the ZIO XT for 14 days. Patient scheduled to come in for placement on this Friday, 07/09/19 at 2 pm.

## 2019-07-12 ENCOUNTER — Other Ambulatory Visit: Payer: Self-pay

## 2019-07-12 ENCOUNTER — Inpatient Hospital Stay: Payer: Medicare Other | Attending: Oncology

## 2019-07-12 ENCOUNTER — Inpatient Hospital Stay: Payer: Medicare Other

## 2019-07-12 DIAGNOSIS — Z79899 Other long term (current) drug therapy: Secondary | ICD-10-CM | POA: Insufficient documentation

## 2019-07-12 DIAGNOSIS — D631 Anemia in chronic kidney disease: Secondary | ICD-10-CM | POA: Diagnosis not present

## 2019-07-12 DIAGNOSIS — N183 Chronic kidney disease, stage 3 unspecified: Secondary | ICD-10-CM | POA: Insufficient documentation

## 2019-07-12 DIAGNOSIS — N184 Chronic kidney disease, stage 4 (severe): Secondary | ICD-10-CM

## 2019-07-12 DIAGNOSIS — I129 Hypertensive chronic kidney disease with stage 1 through stage 4 chronic kidney disease, or unspecified chronic kidney disease: Secondary | ICD-10-CM | POA: Insufficient documentation

## 2019-07-12 LAB — HEMOGLOBIN AND HEMATOCRIT, BLOOD
HCT: 29.8 % — ABNORMAL LOW (ref 39.0–52.0)
Hemoglobin: 10 g/dL — ABNORMAL LOW (ref 13.0–17.0)

## 2019-07-14 ENCOUNTER — Other Ambulatory Visit: Payer: Self-pay | Admitting: Primary Care

## 2019-07-14 DIAGNOSIS — Z794 Long term (current) use of insulin: Secondary | ICD-10-CM

## 2019-07-14 DIAGNOSIS — E119 Type 2 diabetes mellitus without complications: Secondary | ICD-10-CM

## 2019-07-19 ENCOUNTER — Other Ambulatory Visit: Payer: Self-pay

## 2019-07-19 ENCOUNTER — Other Ambulatory Visit (INDEPENDENT_AMBULATORY_CARE_PROVIDER_SITE_OTHER): Payer: Medicare Other

## 2019-07-19 DIAGNOSIS — N2581 Secondary hyperparathyroidism of renal origin: Secondary | ICD-10-CM | POA: Diagnosis not present

## 2019-07-19 DIAGNOSIS — E119 Type 2 diabetes mellitus without complications: Secondary | ICD-10-CM | POA: Diagnosis not present

## 2019-07-19 DIAGNOSIS — E875 Hyperkalemia: Secondary | ICD-10-CM | POA: Diagnosis not present

## 2019-07-19 DIAGNOSIS — Z794 Long term (current) use of insulin: Secondary | ICD-10-CM

## 2019-07-19 DIAGNOSIS — I10 Essential (primary) hypertension: Secondary | ICD-10-CM | POA: Diagnosis not present

## 2019-07-19 DIAGNOSIS — D631 Anemia in chronic kidney disease: Secondary | ICD-10-CM | POA: Diagnosis not present

## 2019-07-19 DIAGNOSIS — N1832 Chronic kidney disease, stage 3b: Secondary | ICD-10-CM | POA: Diagnosis not present

## 2019-07-19 LAB — POCT GLYCOSYLATED HEMOGLOBIN (HGB A1C): Hemoglobin A1C: 6.1 % — AB (ref 4.0–5.6)

## 2019-07-24 ENCOUNTER — Other Ambulatory Visit: Payer: Self-pay | Admitting: Primary Care

## 2019-07-24 DIAGNOSIS — E118 Type 2 diabetes mellitus with unspecified complications: Secondary | ICD-10-CM

## 2019-07-24 DIAGNOSIS — Z794 Long term (current) use of insulin: Secondary | ICD-10-CM

## 2019-07-25 DIAGNOSIS — S0532XA Ocular laceration without prolapse or loss of intraocular tissue, left eye, initial encounter: Secondary | ICD-10-CM | POA: Diagnosis not present

## 2019-07-25 DIAGNOSIS — Z7982 Long term (current) use of aspirin: Secondary | ICD-10-CM | POA: Diagnosis not present

## 2019-07-25 DIAGNOSIS — S0990XA Unspecified injury of head, initial encounter: Secondary | ICD-10-CM | POA: Diagnosis not present

## 2019-07-25 DIAGNOSIS — R001 Bradycardia, unspecified: Secondary | ICD-10-CM | POA: Diagnosis not present

## 2019-07-25 DIAGNOSIS — E119 Type 2 diabetes mellitus without complications: Secondary | ICD-10-CM | POA: Diagnosis not present

## 2019-07-25 DIAGNOSIS — S065X0A Traumatic subdural hemorrhage without loss of consciousness, initial encounter: Secondary | ICD-10-CM | POA: Diagnosis not present

## 2019-07-25 DIAGNOSIS — I6200 Nontraumatic subdural hemorrhage, unspecified: Secondary | ICD-10-CM | POA: Diagnosis not present

## 2019-07-25 DIAGNOSIS — R402412 Glasgow coma scale score 13-15, at arrival to emergency department: Secondary | ICD-10-CM | POA: Diagnosis not present

## 2019-07-25 DIAGNOSIS — R7989 Other specified abnormal findings of blood chemistry: Secondary | ICD-10-CM | POA: Diagnosis not present

## 2019-07-25 DIAGNOSIS — Z87891 Personal history of nicotine dependence: Secondary | ICD-10-CM | POA: Diagnosis not present

## 2019-07-25 DIAGNOSIS — Z794 Long term (current) use of insulin: Secondary | ICD-10-CM | POA: Diagnosis not present

## 2019-07-25 DIAGNOSIS — S065X9A Traumatic subdural hemorrhage with loss of consciousness of unspecified duration, initial encounter: Secondary | ICD-10-CM | POA: Diagnosis not present

## 2019-07-25 DIAGNOSIS — S01112A Laceration without foreign body of left eyelid and periocular area, initial encounter: Secondary | ICD-10-CM | POA: Diagnosis not present

## 2019-07-25 DIAGNOSIS — W010XXA Fall on same level from slipping, tripping and stumbling without subsequent striking against object, initial encounter: Secondary | ICD-10-CM | POA: Diagnosis not present

## 2019-07-25 DIAGNOSIS — I2510 Atherosclerotic heart disease of native coronary artery without angina pectoris: Secondary | ICD-10-CM | POA: Diagnosis not present

## 2019-07-25 DIAGNOSIS — Y92520 Airport as the place of occurrence of the external cause: Secondary | ICD-10-CM | POA: Diagnosis not present

## 2019-07-25 DIAGNOSIS — Z951 Presence of aortocoronary bypass graft: Secondary | ICD-10-CM | POA: Diagnosis not present

## 2019-07-25 DIAGNOSIS — W19XXXA Unspecified fall, initial encounter: Secondary | ICD-10-CM | POA: Diagnosis not present

## 2019-07-25 DIAGNOSIS — D539 Nutritional anemia, unspecified: Secondary | ICD-10-CM | POA: Diagnosis not present

## 2019-07-26 DIAGNOSIS — W19XXXA Unspecified fall, initial encounter: Secondary | ICD-10-CM | POA: Diagnosis not present

## 2019-07-26 DIAGNOSIS — S0990XA Unspecified injury of head, initial encounter: Secondary | ICD-10-CM | POA: Diagnosis not present

## 2019-07-26 DIAGNOSIS — R402412 Glasgow coma scale score 13-15, at arrival to emergency department: Secondary | ICD-10-CM | POA: Diagnosis not present

## 2019-07-26 DIAGNOSIS — R7989 Other specified abnormal findings of blood chemistry: Secondary | ICD-10-CM | POA: Diagnosis not present

## 2019-07-26 DIAGNOSIS — S01112A Laceration without foreign body of left eyelid and periocular area, initial encounter: Secondary | ICD-10-CM | POA: Diagnosis not present

## 2019-07-26 DIAGNOSIS — D649 Anemia, unspecified: Secondary | ICD-10-CM | POA: Diagnosis not present

## 2019-07-29 ENCOUNTER — Telehealth: Payer: Self-pay | Admitting: Primary Care

## 2019-07-29 ENCOUNTER — Ambulatory Visit: Payer: Medicare Other | Admitting: Dermatology

## 2019-07-29 NOTE — Telephone Encounter (Signed)
Yes I can place and remove stitches. Please schedule.

## 2019-07-29 NOTE — Telephone Encounter (Signed)
Left message for patient to return call.

## 2019-07-29 NOTE — Telephone Encounter (Signed)
Patient called today to schedule an appointment  He would like to know if you are able to remove his stitches for him   Patient would like to come next week if possible

## 2019-08-03 ENCOUNTER — Ambulatory Visit (INDEPENDENT_AMBULATORY_CARE_PROVIDER_SITE_OTHER): Payer: Medicare Other

## 2019-08-03 ENCOUNTER — Other Ambulatory Visit: Payer: Self-pay

## 2019-08-03 DIAGNOSIS — I4589 Other specified conduction disorders: Secondary | ICD-10-CM

## 2019-08-03 DIAGNOSIS — R531 Weakness: Secondary | ICD-10-CM

## 2019-08-03 DIAGNOSIS — I444 Left anterior fascicular block: Secondary | ICD-10-CM | POA: Diagnosis not present

## 2019-08-03 DIAGNOSIS — I453 Trifascicular block: Secondary | ICD-10-CM

## 2019-08-04 ENCOUNTER — Ambulatory Visit: Payer: Medicare Other | Admitting: Primary Care

## 2019-08-04 ENCOUNTER — Encounter: Payer: Self-pay | Admitting: Primary Care

## 2019-08-04 ENCOUNTER — Ambulatory Visit (INDEPENDENT_AMBULATORY_CARE_PROVIDER_SITE_OTHER): Payer: Medicare Other | Admitting: Primary Care

## 2019-08-04 VITALS — BP 130/80 | HR 63 | Temp 95.7°F | Ht 70.0 in | Wt 161.2 lb

## 2019-08-04 DIAGNOSIS — W19XXXA Unspecified fall, initial encounter: Secondary | ICD-10-CM | POA: Insufficient documentation

## 2019-08-04 DIAGNOSIS — W19XXXD Unspecified fall, subsequent encounter: Secondary | ICD-10-CM | POA: Diagnosis not present

## 2019-08-04 DIAGNOSIS — Z4802 Encounter for removal of sutures: Secondary | ICD-10-CM | POA: Diagnosis not present

## 2019-08-04 DIAGNOSIS — S01112A Laceration without foreign body of left eyelid and periocular area, initial encounter: Secondary | ICD-10-CM

## 2019-08-04 NOTE — Assessment & Plan Note (Signed)
[place]ustained initially on [place] 23rd at the [facility] [place]. Admitted overnight in [place], discharged once repeat CT head showed no change in hematoma.  Exam today unremarkable. 7 sutures removed from left eye brow, patient tolerated well. Site cleansed before and after removal.   Return precautions provided.

## 2019-08-04 NOTE — Patient Instructions (Signed)
Keep the site clean and dry.   It was a pleasure to see you today!

## 2019-08-04 NOTE — Progress Notes (Signed)
Subjective:    Patient ID: Wendy Mikles, male    DOB: 10-09-1926, 87 y.o.   MRN: [id number]  HPI  This visit occurred during the SARS-CoV-2 public health emergency.  Safety protocols were in place, including screening questions prior to the visit, additional usage of staff PPE, and extensive cleaning of exam room while observing appropriate contact time as indicated for disinfecting solutions.   Mr. Fooks is a 86 year old male with a history of CAD, hypertension, type 2 diabetes, CKD Stage III, hypothyroidism, chronic anemia who presents today for suture removal.  He was walking into the air port in [place], [place] on May 23rd, he pushing two suitcases, and tripped up an incline falling forward hitting his left head. He was admitted to the hospital in [facility] overnight due to a slight subdural hematoma noted on CT scan. The second CT head did not show a change or increased bleeding so he was discharged the following morning.  He also had 7 sutures placed to the eye brow for a laceration he sustained.   He denies headaches, dizziness, blurred vision. His last tetanus vaccine was in 2018. He is here for suture removal.   Review of Systems  Respiratory: Negative for shortness of breath.   Skin: Negative for color change.  Neurological: Negative for dizziness and headaches.       Past Medical History:  Diagnosis Date  . Anemia   . Carotid artery occlusion    a. 2004 s/p L carotid endarterectomy; b. 02/2019 U/S: 1-39% bilat ICA stenosis. <50% bilat CCA stenosis.  . CKD (chronic kidney disease) stage 3, GFR 30-59 ml/min   . Coronary artery disease    a. 1994 s/p CABG x 3, [place], [place] (LIMA->LAD, VG->dRCA, VG->unknown vessel); b. 02/2016 Cath: LM 99d, LAD 100ost, LCX 100ost, RCA 80ost, diffuse 85-75m LIMA->LAD ok, VG->RCA ok. VG->unknown vessel 100.  . Diabetes mellitus without complication ([place]   . Dyslipidemia   . GERD (gastroesophageal reflux disease)   . Hemorrhoids   .  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 History of echocardiogram    a. 02/2017 Echo: EF 55-65%, no rwma. Nl DD. Ao sclerosis w/o stenosis. Mod TR. Mild MR.  .Marland KitchenHypertension   . Pulmonary embolism ([place]   . Thyroid disease      Social History   Socioeconomic History  . Marital status: Widowed    Spouse name: Not on file  . Number of children: 4  . Years of education: 16  . Highest education level: Bachelor's degree (e.g., BA, AB, BS)  Occupational History  . Occupation: retired  Tobacco Use  . Smoking status: Former Smoker    Packs/day: 1.50    Years: 25.00    Pack years: 37.50    Types: Cigarettes    Quit date: 1978    Years since quitting: 43.4  . Smokeless tobacco: Never Used  Substance and Sexual Activity  . Alcohol use: Yes    Alcohol/week: 1.0 standard drinks    Types: 1 Glasses of wine per week    Comment: socially   . Drug use: No  . Sexual activity: Not Currently  Other Topics Concern  . Not on file  Social History Narrative   Widower.   4 children, 7 grandchildren, 1 great grandchild.   Retired. Once worked as an account.    Enjoys exercising, spending time with family.    Social Determinants of Health   Financial Resource Strain:   . Difficulty of Paying Living  Expenses:   Food Insecurity:   . Worried About Charity fundraiser in the Last Year:   . Arboriculturist in the Last Year:   Transportation Needs:   . Film/video editor (Medical):   Marland Kitchen Lack of Transportation (Non-Medical):   Physical Activity:   . Days of Exercise per Week:   . Minutes of Exercise per Session:   Stress:   . Feeling of Stress :   Social Connections:   . Frequency of Communication with Friends and Family:   . Frequency of Social Gatherings with Friends and Family:   . Attends Religious Services:   . Active Member of Clubs or Organizations:   . Attends Archivist Meetings:   Marland Kitchen Marital Status:   Intimate Partner Violence:   . Fear of Current or Ex-Partner:    . Emotionally Abused:   Marland Kitchen Physically Abused:   . Sexually Abused:     Past Surgical History:  Procedure Laterality Date  . CAROTID ENDARTERECTOMY Left 2004  . CORONARY ARTERY BYPASS GRAFT  1994   [name]  . TONSILLECTOMY AND ADENOIDECTOMY      Family History  Problem Relation Age of Onset  . Heart attack Mother   . Heart disease Mother   . Stroke Mother   . Diabetes Father   . Coronary artery disease Sister     Allergies  Allergen Reactions  . Heparin Other (See Comments)  . Patiromer Other (See Comments)  . Sodium Zirconium Cyclosilicate Other (See Comments)    Current Outpatient Medications on File Prior to Visit  Medication Sig Dispense Refill  . aspirin 81 MG tablet Take 81 mg by mouth every evening.     Marland Kitchen atorvastatin (LIPITOR) 40 MG tablet TAKE 1 TABLET BY MOUTH  DAILY 90 tablet 1  . betamethasone valerate (VALISONE) 0.1 % cream APPLY TO AFFECTED AREA TWICE A DAY 30 g 0  . Blood Glucose Monitoring Suppl (ONETOUCH VERIO) w/Device KIT 1 Device by Does not apply route daily. Use as instructed to test blood sugar daily 1 kit 0  . CALCIUM PO Take 600 mg by mouth every morning.     . carvedilol (COREG) 6.25 MG tablet TAKE 1 TABLET BY MOUTH  TWICE DAILY WITH MEALS 180 tablet 1  . cholecalciferol (VITAMIN D3) 25 MCG (1000 UT) tablet Take 1,000 Units by mouth daily.    Marland Kitchen epoetin alfa (EPOGEN,PROCRIT) 03500 UNIT/ML injection 10,000 Units. Every 2 to 4 weeks    . furosemide (LASIX) 40 MG tablet TAKE 1 TABLET BY MOUTH  DAILY 90 tablet 1  . glimepiride (AMARYL) 4 MG tablet TAKE 1 TABLET BY MOUTH  DAILY WITH BREAKFAST 120 tablet 1  . Hydrocortisone (PROCTOSOL HC RE) Place rectally as needed.    . Insulin Glargine (LANTUS) 100 UNIT/ML Solostar Pen Inject 10 Units into the skin daily.    . Insulin Pen Needle 29G X 12.7MM MISC BD Ultrafine Pen Needle Use as instructed to inject insulin daily 300 each 1  . IRON PO Take by mouth. 2 QAM 1 QPM    . Lancets (ONETOUCH DELICA  PLUS [id number]) MISC USE AS INSTRUCT TO TEST BLOOD SUGAR ONCE DAILY 100 each 5  . levothyroxine (SYNTHROID) 175 MCG tablet TAKE 1 TABLET BY MOUTH IN  THE MORNING ON AN EMPTY  STOMACH WITH A FULL GLASS  OF WATER 120 tablet 1  . Multiple Vitamin (MULTIVITAMIN) capsule Take 1 capsule by mouth daily.    . Omega-3 Fatty Acids (  FISH OIL PO) Take 1,200 mg by mouth 4 (four) times daily.    Glory Rosebush VERIO test strip USE AS INSTRUCTED TO TEST BLOOD SUGAR ONCE DAILY 100 strip 5  . pantoprazole (PROTONIX) 20 MG tablet TAKE 1 TABLET BY MOUTH  DAILY 90 tablet 1  . ranolazine (RANEXA) 1000 MG SR tablet Take 1 tablet (1,000 mg total) by mouth 2 (two) times daily. 180 tablet 3  . timolol (TIMOPTIC) 0.5 % ophthalmic solution APPLY 1 DROP(S) IN BOTH EYES ONCE A DAY  11  . nitroGLYCERIN (NITROSTAT) 0.4 MG SL tablet Place 1 tablet (0.4 mg total) under the tongue every 5 (five) minutes as needed for chest pain. 25 tablet 3   No current facility-administered medications on file prior to visit.    BP 130/80   Pulse 63   Temp (!) 95.7 F (35.4 C) (Temporal)   Ht '5\' 10"'$  (1.778 m)   Wt 161 lb 4 oz (73.1 kg)   SpO2 98%   BMI 23.14 kg/m    Objective:   Physical Exam  Constitutional: He appears well-nourished.  Respiratory: Effort normal.  Skin: Skin is warm and dry. No erythema.  7 intact sutures to left eye brown. No signs of infection.            Assessment & Plan:

## 2019-08-11 ENCOUNTER — Other Ambulatory Visit: Payer: Self-pay

## 2019-08-11 ENCOUNTER — Inpatient Hospital Stay (HOSPITAL_BASED_OUTPATIENT_CLINIC_OR_DEPARTMENT_OTHER): Payer: Medicare Other | Admitting: Oncology

## 2019-08-11 ENCOUNTER — Encounter: Payer: Self-pay | Admitting: Oncology

## 2019-08-11 ENCOUNTER — Inpatient Hospital Stay: Payer: Medicare Other | Attending: Oncology

## 2019-08-11 ENCOUNTER — Inpatient Hospital Stay: Payer: Medicare Other

## 2019-08-11 VITALS — BP 134/50 | HR 58 | Temp 97.8°F | Wt 158.9 lb

## 2019-08-11 DIAGNOSIS — K649 Unspecified hemorrhoids: Secondary | ICD-10-CM | POA: Diagnosis not present

## 2019-08-11 DIAGNOSIS — D631 Anemia in chronic kidney disease: Secondary | ICD-10-CM | POA: Insufficient documentation

## 2019-08-11 DIAGNOSIS — E785 Hyperlipidemia, unspecified: Secondary | ICD-10-CM | POA: Diagnosis not present

## 2019-08-11 DIAGNOSIS — Z87891 Personal history of nicotine dependence: Secondary | ICD-10-CM | POA: Diagnosis not present

## 2019-08-11 DIAGNOSIS — I2510 Atherosclerotic heart disease of native coronary artery without angina pectoris: Secondary | ICD-10-CM | POA: Diagnosis not present

## 2019-08-11 DIAGNOSIS — Z79899 Other long term (current) drug therapy: Secondary | ICD-10-CM | POA: Diagnosis not present

## 2019-08-11 DIAGNOSIS — E079 Disorder of thyroid, unspecified: Secondary | ICD-10-CM | POA: Insufficient documentation

## 2019-08-11 DIAGNOSIS — D7582 Heparin induced thrombocytopenia (HIT): Secondary | ICD-10-CM | POA: Diagnosis not present

## 2019-08-11 DIAGNOSIS — E119 Type 2 diabetes mellitus without complications: Secondary | ICD-10-CM | POA: Diagnosis not present

## 2019-08-11 DIAGNOSIS — Z86711 Personal history of pulmonary embolism: Secondary | ICD-10-CM | POA: Insufficient documentation

## 2019-08-11 DIAGNOSIS — N183 Chronic kidney disease, stage 3 unspecified: Secondary | ICD-10-CM | POA: Diagnosis not present

## 2019-08-11 DIAGNOSIS — N184 Chronic kidney disease, stage 4 (severe): Secondary | ICD-10-CM | POA: Insufficient documentation

## 2019-08-11 DIAGNOSIS — Z7982 Long term (current) use of aspirin: Secondary | ICD-10-CM | POA: Insufficient documentation

## 2019-08-11 DIAGNOSIS — D696 Thrombocytopenia, unspecified: Secondary | ICD-10-CM

## 2019-08-11 DIAGNOSIS — K219 Gastro-esophageal reflux disease without esophagitis: Secondary | ICD-10-CM | POA: Insufficient documentation

## 2019-08-11 DIAGNOSIS — I129 Hypertensive chronic kidney disease with stage 1 through stage 4 chronic kidney disease, or unspecified chronic kidney disease: Secondary | ICD-10-CM | POA: Insufficient documentation

## 2019-08-11 LAB — CBC WITH DIFFERENTIAL/PLATELET
Abs Immature Granulocytes: 0.03 10*3/uL (ref 0.00–0.07)
Basophils Absolute: 0 10*3/uL (ref 0.0–0.1)
Basophils Relative: 1 %
Eosinophils Absolute: 0.1 10*3/uL (ref 0.0–0.5)
Eosinophils Relative: 2 %
HCT: 29.9 % — ABNORMAL LOW (ref 39.0–52.0)
Hemoglobin: 10 g/dL — ABNORMAL LOW (ref 13.0–17.0)
Immature Granulocytes: 1 %
Lymphocytes Relative: 22 %
Lymphs Abs: 1.3 10*3/uL (ref 0.7–4.0)
MCH: 34.2 pg — ABNORMAL HIGH (ref 26.0–34.0)
MCHC: 33.4 g/dL (ref 30.0–36.0)
MCV: 102.4 fL — ABNORMAL HIGH (ref 80.0–100.0)
Monocytes Absolute: 0.7 10*3/uL (ref 0.1–1.0)
Monocytes Relative: 12 %
Neutro Abs: 3.7 10*3/uL (ref 1.7–7.7)
Neutrophils Relative %: 62 %
Platelets: 151 10*3/uL (ref 150–400)
RBC: 2.92 MIL/uL — ABNORMAL LOW (ref 4.22–5.81)
RDW: 12.8 % (ref 11.5–15.5)
WBC: 5.9 10*3/uL (ref 4.0–10.5)
nRBC: 0 % (ref 0.0–0.2)

## 2019-08-11 NOTE — Progress Notes (Signed)
Hematology/Oncology follow up note [facility] [phone] [phone]   Patient Care Team: Pleas Koch, NP as PCP - General (Internal Medicine) End, Harrell Gave, MD as PCP - Cardiology (Cardiology) Dingeldein, Remo Lipps, MD as Referring Physician (Ophthalmology) End, Harrell Gave, MD as Consulting Physician (Cardiology) Earlie Server, MD as Consulting Physician (Oncology)  REFERRING PROVIDER: Dr.Lateef REASON FOR VISIT Follow up for treatment of anemia of CKD, on erythropoietin replacement therapy.  HISTORY OF PRESENTING ILLNESS:  Tommy [name] is a  86 y.o.  male with PMH listed below who was referred to me for evaluation of anemia.  Patient recently removed from [place] to [place] to live with his son.  He has chronic kidney disease and anemia associated with CKD.  He reports that he has been getting Procrit 10,000 units every 2 weeks for many years.  Denies any recent thrombosis events.  Patient reports that at the last Procrit shot he received was back in September 2018 before he moved here. He establish care with Dr. Holley Raring for his CKD and he was referred to me for evaluation of anemia and possible Procrit shots.  Patient had lab work done at [facility] on 05/15/2017 hemoglobin 9.6, MCV 98, WBC 4.5, platelet count 1 70,000, normal differential use.   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 He has IVC filter in place.   INTERVAL HISTORY Tommy [name] is a 86 y.o. male who has above history reviewed by me today presents for follow-up for anemia due to CKD. Patient has been on erythropoietin therapy monthly if hemoglobin is less than 10. Patient reports he had a fall and had cut on his eyebrow.  He was seen [place] being his grandson.  There he had a hemoglobin of 9.5. He reports feeling well.  No new complaints.   Denies any shortness of breath,  chest pain, lower extremity swelling or abdominal pain. Chronic fatigue is at baseline.  No change .   Review of Systems  Constitutional: Negative for chills, fever, malaise/fatigue and weight loss.  HENT: Negative for sore throat.   Eyes: Negative for redness.  Respiratory: Negative for cough, shortness of breath and wheezing.   Cardiovascular: Negative for chest pain, palpitations and leg swelling.  Gastrointestinal: Negative for abdominal pain, blood in stool, nausea and vomiting.  Genitourinary: Negative for dysuria.  Musculoskeletal: Negative for myalgias.  Skin: Negative for rash.  Neurological: Negative for dizziness, tingling and tremors.  Endo/Heme/Allergies: Does not bruise/bleed easily.  Psychiatric/Behavioral: Negative for hallucinations.    MEDICAL HISTORY:  Past Medical History:  Diagnosis Date  . Anemia   . Carotid artery occlusion    a. 2004 s/p L carotid endarterectomy; b. 02/2019 U/S: 1-39% bilat ICA stenosis. <50% bilat CCA stenosis.  . CKD (chronic kidney disease) stage 3, GFR 30-59 ml/min   . Coronary artery disease    a. 1994 s/p CABG x 3, [place], [place] (LIMA->LAD, VG->dRCA, VG->unknown vessel); b. 02/2016 Cath: LM 99d, LAD 100ost, LCX 100ost, RCA 80ost, diffuse 85-36m LIMA->LAD ok, VG->RCA ok. VG->unknown vessel 100.  . Diabetes [place] complication ([place]   . Dyslipidemia   . GERD (gastroesophageal reflux disease)   . Hemorrhoids   . 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 History of echocardiogram    a. 02/2017 Echo: EF 55-65%, no rwma. Nl DD. Ao sclerosis w/o stenosis. Mod TR. Mild MR.  .Marland KitchenHypertension   . Pulmonary embolism (  [place])   . Thyroid disease     SURGICAL HISTORY: Past Surgical History:  Procedure Laterality Date  . CAROTID ENDARTERECTOMY Left 2004  . CORONARY ARTERY BYPASS GRAFT  1994   [name]  . TONSILLECTOMY AND ADENOIDECTOMY      SOCIAL HISTORY: Social History   Socioeconomic History  . Marital status:  Widowed    Spouse name: Not on file  . Number of children: 4  . Years of education: 16  . Highest education level: Bachelor's degree (e.g., BA, AB, BS)  Occupational History  . Occupation: retired  Tobacco Use  . Smoking status: Former Smoker    Packs/day: 1.50    Years: 25.00    Pack years: 37.50    Types: Cigarettes    Quit date: 1978    Years since quitting: 43.4  . Smokeless tobacco: Never Used  Substance and Sexual Activity  . Alcohol use: Yes    Alcohol/week: 1.0 standard drinks    Types: 1 Glasses of wine per week    Comment: socially   . Drug use: No  . Sexual activity: Not Currently  Other Topics Concern  . Not on file  Social History Narrative   Widower.   4 children, 7 grandchildren, 1 great grandchild.   Retired. Once worked as an account.    Enjoys exercising, spending time with family.    Social Determinants of Health   Financial Resource Strain:   . Difficulty of Paying Living Expenses:   Food Insecurity:   . Worried About Charity fundraiser in the Last Year:   . Arboriculturist in the Last Year:   Transportation Needs:   . Film/video editor (Medical):   Marland Kitchen Lack of Transportation (Non-Medical):   Physical Activity:   . Days of Exercise per Week:   . Minutes of Exercise per Session:   Stress:   . Feeling of Stress :   Social Connections:   . Frequency of Communication with Friends and Family:   . Frequency of Social Gatherings with Friends and Family:   . Attends Religious Services:   . Active Member of Clubs or Organizations:   . Attends Archivist Meetings:   Marland Kitchen Marital Status:   Intimate Partner Violence:   . Fear of Current or Ex-Partner:   . Emotionally Abused:   Marland Kitchen Physically Abused:   . Sexually Abused:     FAMILY HISTORY: Family History  Problem Relation Age of Onset  . Heart attack Mother   . Heart disease Mother   . Stroke Mother   . Diabetes Father   . Coronary artery disease Sister     ALLERGIES:  is allergic  to heparin; patiromer; and sodium zirconium cyclosilicate.  MEDICATIONS:  Current Outpatient Medications  Medication Sig Dispense Refill  . aspirin 81 MG tablet Take 81 mg by mouth every evening.     Marland Kitchen atorvastatin (LIPITOR) 40 MG tablet TAKE 1 TABLET BY MOUTH  DAILY 90 tablet 1  . betamethasone valerate (VALISONE) 0.1 % cream APPLY TO AFFECTED AREA TWICE A DAY 30 g 0  . Blood Glucose Monitoring Suppl (ONETOUCH VERIO) w/Device KIT 1 Device by Does not apply route daily. Use as instructed to test blood sugar daily 1 kit 0  . CALCIUM PO Take 600 mg by mouth every morning.     . carvedilol (COREG) 6.25 MG tablet TAKE 1 TABLET BY MOUTH  TWICE DAILY WITH MEALS 180 tablet 1  . cholecalciferol (VITAMIN D3) 25  MCG (1000 UT) tablet Take 1,000 Units by mouth daily.    Marland Kitchen epoetin alfa (EPOGEN,PROCRIT) 81856 UNIT/ML injection 10,000 Units. Every 2 to 4 weeks    . furosemide (LASIX) 40 MG tablet TAKE 1 TABLET BY MOUTH  DAILY 90 tablet 1  . glimepiride (AMARYL) 4 MG tablet TAKE 1 TABLET BY MOUTH  DAILY WITH BREAKFAST 120 tablet 1  . Hydrocortisone (PROCTOSOL HC RE) Place rectally as needed.    . Insulin Glargine (LANTUS) 100 UNIT/ML Solostar Pen Inject 10 Units into the skin daily.    . Insulin Pen Needle 29G X 12.7MM MISC BD Ultrafine Pen Needle Use as instructed to inject insulin daily 300 each 1  . IRON PO Take by mouth. 2 QAM 1 QPM    . Lancets (ONETOUCH DELICA PLUS [id number]) MISC USE AS INSTRUCT TO TEST BLOOD SUGAR ONCE DAILY 100 each 5  . levothyroxine (SYNTHROID) 175 MCG tablet TAKE 1 TABLET BY MOUTH IN  THE MORNING ON AN EMPTY  STOMACH WITH A FULL GLASS  OF WATER 120 tablet 1  . Multiple Vitamin (MULTIVITAMIN) capsule Take 1 capsule by mouth daily.    . Omega-3 Fatty Acids (FISH OIL PO) Take 1,200 mg by mouth 4 (four) times daily.    Glory Rosebush VERIO test strip USE AS INSTRUCTED TO TEST BLOOD SUGAR ONCE DAILY 100 strip 5  . pantoprazole (PROTONIX) 20 MG tablet TAKE 1 TABLET BY MOUTH  DAILY 90  tablet 1  . ranolazine (RANEXA) 1000 MG SR tablet Take 1 tablet (1,000 mg total) by mouth 2 (two) times daily. 180 tablet 3  . timolol (TIMOPTIC) 0.5 % ophthalmic solution APPLY 1 DROP(S) IN BOTH EYES ONCE A DAY  11  . nitroGLYCERIN (NITROSTAT) 0.4 MG SL tablet Place 1 tablet (0.4 mg total) under the tongue every 5 (five) minutes as needed for chest pain. 25 tablet 3   No current facility-administered medications for this visit.     PHYSICAL EXAMINATION: ECOG PERFORMANCE STATUS: 1 - Symptomatic but completely ambulatory Vitals:   08/11/19 1003  BP: (!) 134/50  Pulse: (!) 58  Temp: 97.8 F (36.6 C)  SpO2: 99%   Filed Weights   08/11/19 1003  Weight: 158 lb 14.4 oz (72.1 kg)    Physical Exam  Constitutional: He is oriented to person, place, and time. No distress.  Patient walks independently  HENT:  Head: Normocephalic and atraumatic.  Nose: Nose normal.  Mouth/Throat: Oropharynx is clear and moist. No oropharyngeal exudate.  Eyes: Pupils are equal, round, and reactive to light. EOM are normal. No scleral icterus.  Cardiovascular: Normal rate and regular rhythm.  No murmur heard. Pulmonary/Chest: Effort normal. No respiratory distress. He has no rales. He exhibits no tenderness.  Abdominal: Soft. He exhibits no distension. There is no abdominal tenderness.  Musculoskeletal:        General: No edema. Normal range of motion.     Cervical back: Normal range of motion and neck supple.  Neurological: He is alert and oriented to person, place, and time. No cranial nerve deficit. He exhibits normal muscle tone. Coordination normal.  Skin: Skin is warm and dry. He is not diaphoretic. No erythema.  Psychiatric: Affect normal.     LABORATORY DATA:  I have reviewed the data as listed Lab Results  Component Value Date   WBC 5.9 08/11/2019   HGB 10.0 (L) 08/11/2019   HCT 29.9 (L) 08/11/2019   MCV 102.4 (H) 08/11/2019   PLT 151 08/11/2019   Recent Labs  03/29/19 1222  03/29/19 1222 04/08/19 1111 04/20/19 1449 06/08/19 1750  NA 136   < > 133* 135 135  K 5.0   < > 5.2* 5.2* 5.4*  CL 101   < > 102 100 100  CO2 24   < > '23 29 22  '$ GLUCOSE 237*   < > 240* 161* 95  BUN 42*   < > 48* 34* 41*  CREATININE 1.67*   < > 1.86* 1.60* 2.02*  CALCIUM 8.9   < > 9.4 9.5 9.8  GFRNONAA 35*  --  31*  --  28*  GFRAA 41*  --  36*  --  33*  PROT  --   --   --  6.7  --   ALBUMIN  --   --   --  4.0  --   AST  --   --   --  22  --   ALT  --   --   --  22  --   ALKPHOS  --   --   --  80  --   BILITOT  --   --   --  0.4  --    < > = values in this interval not displayed.     Iron/TIBC/Ferritin/ %Sat    Component Value Date/Time   IRON 109 05/23/2017 1153   TIBC 311 05/23/2017 1153   FERRITIN 670 (H) 05/23/2017 1153   IRONPCTSAT 35 05/23/2017 1153   SPEP negative for M spike.   RADIOGRAPHIC STUDIES: I have personally reviewed the radiological images as listed and agreed with the findings in the report. 01/19/2018 CXR FINDINGS: The lungs are clear and negative for focal airspace consolidation, pulmonary edema or suspicious pulmonary nodule. No pleural effusion or pneumothorax. Cardiac and mediastinal contours are within normal limits. Patient is status post median sternotomy with evidence of prior CABG including LIMA bypass. Trace linear scarring versus atelectasis in the lingula. No acute fracture or lytic or blastic osseous lesions. The visualized upper abdominal bowel gas pattern is unremarkable.  IMPRESSION: No active cardiopulmonary disease. ASSESSMENT & PLAN:  1. Anemia of chronic renal failure, stage 3 (moderate), unspecified whether stage 3a or 3b CKD   2. CKD (chronic kidney disease) stage 4, GFR 15-29 ml/min (HCC)   3. Thrombocytopenia ([place])    #Labs reviewed and discussed with patient. Hemoglobin is 10 today.  Hold off Retacrit 20,000 unit today. Continue H&H every 4 weeks and proceed with Retacrit 20,000 if hemoglobin is less than 10. I offered  patient to come every 6 weeks for checking hemoglobin and he prefers to keep the same previous schedule.  #Chronic thrombocytopenia, thrombocytopenia has resolved.  Reticulocyte count at 151.  No need for additional work-up. CKD, avoid nephrotoxins.  Continue follow-up with nephrology.  All questions were answered. The patient knows to call the clinic with any problems questions or concerns. Orders Placed This Encounter  Procedures  . Hemoglobin and hematocrit, blood    Standing Status:   Standing    Number of Occurrences:   2    Standing Expiration Date:   08/10/2020  . CBC with Differential/Platelet    Standing Status:   Future    Standing Expiration Date:   08/10/2020    Return of visit:  Monthly H&H +/- retacrit. Follow up in clinic in 3 months.     Earlie Server, MD, PhD  08/11/2019

## 2019-08-11 NOTE — Progress Notes (Signed)
Patient denies new problems/concerns today.   °

## 2019-08-12 NOTE — Telephone Encounter (Signed)
Done..  Appts has been scheduled in the afternoon on 11/10/19.

## 2019-08-28 ENCOUNTER — Other Ambulatory Visit: Payer: Self-pay | Admitting: Primary Care

## 2019-08-28 DIAGNOSIS — I8393 Asymptomatic varicose veins of bilateral lower extremities: Secondary | ICD-10-CM

## 2019-08-30 ENCOUNTER — Encounter: Payer: Self-pay | Admitting: Dermatology

## 2019-08-30 ENCOUNTER — Other Ambulatory Visit: Payer: Self-pay

## 2019-08-30 ENCOUNTER — Ambulatory Visit (INDEPENDENT_AMBULATORY_CARE_PROVIDER_SITE_OTHER): Payer: Medicare Other | Admitting: Dermatology

## 2019-08-30 DIAGNOSIS — D692 Other nonthrombocytopenic purpura: Secondary | ICD-10-CM

## 2019-08-30 DIAGNOSIS — I25118 Atherosclerotic heart disease of native coronary artery with other forms of angina pectoris: Secondary | ICD-10-CM

## 2019-08-30 DIAGNOSIS — Z1283 Encounter for screening for malignant neoplasm of skin: Secondary | ICD-10-CM | POA: Diagnosis not present

## 2019-08-30 DIAGNOSIS — L570 Actinic keratosis: Secondary | ICD-10-CM | POA: Diagnosis not present

## 2019-08-30 DIAGNOSIS — L578 Other skin changes due to chronic exposure to nonionizing radiation: Secondary | ICD-10-CM

## 2019-08-30 NOTE — Progress Notes (Addendum)
   Follow-Up Visit   Subjective  Tommy [name] is a 86 y.o. male who presents for the following: Follow-up.  Patient presents to follow up on OV 04/21/19, patient received Cryotherapy fo r Ak's scalp and ears x 19 and ISK, R xyphoid, L intraocular and L upper back.  The following portions of the chart were reviewed this encounter and updated as appropriate:  Tobacco  Allergies  Meds  Problems  Med Hx  Surg Hx  Fam Hx      Review of Systems:  No other skin or systemic complaints except as noted in HPI or Assessment and Plan.  Objective  Well appearing patient in no apparent distress; mood and affect are within normal limits.  A full examination was performed including scalp, head, eyes, ears, nose, lips, neck, chest, axillae, abdomen, back, buttocks, bilateral upper extremities, bilateral lower extremities, hands, feet, fingers, toes, fingernails, and toenails. All findings within normal limits unless otherwise noted below.  Objective  Face and Scalp x 6 (6): Erythematous thin papules/macules with gritty scale.    Assessment & Plan     AK (actinic keratosis) (6) Face and Scalp x 6  Cryotherapy today Prior to procedure, discussed risks of blister formation, small wound, skin dyspigmentation, or rare scar following cryotherapy.    Destruction of lesion - Face and Scalp x 6 Complexity: simple   Destruction method: cryotherapy   Informed consent: discussed and consent obtained   Timeout:  patient name, date of birth, surgical site, and procedure verified Lesion destroyed using liquid nitrogen: Yes   Region frozen until ice ball extended beyond lesion: Yes   Outcome: patient tolerated procedure well with no complications   Post-procedure details: wound care instructions given     Purpura - Violaceous macules and patches - Benign - Related to age, sun damage and/or use of blood thinners - Observe - Can use OTC arnica containing moisturizer such as Dermend Bruise  Formula if desired - Call for worsening or other concerns  Actinic Damage - diffuse scaly erythematous macules with underlying dyspigmentation - Recommend daily broad spectrum sunscreen SPF 30+ to sun-exposed areas, reapply every 2 hours as needed.  - Call for new or changing lesions.   Return in about 7 months (around 03/31/2020) for TBSE. I, Donzetta Kohut, CMA, am acting as scribe for Sarina Ser, MD . Documentation: I have reviewed the above documentation for accuracy and completeness, and I agree with the above.  Sarina Ser, MD

## 2019-08-30 NOTE — Patient Instructions (Signed)
Recommend daily broad spectrum sunscreen SPF 30+ to sun-exposed areas, reapply every 2 hours as needed. Call for new or changing lesions.  

## 2019-09-02 DIAGNOSIS — I4589 Other specified conduction disorders: Secondary | ICD-10-CM | POA: Diagnosis not present

## 2019-09-03 ENCOUNTER — Other Ambulatory Visit: Payer: Self-pay | Admitting: Primary Care

## 2019-09-03 DIAGNOSIS — I2510 Atherosclerotic heart disease of native coronary artery without angina pectoris: Secondary | ICD-10-CM

## 2019-09-07 NOTE — Telephone Encounter (Signed)
Last prescribed on 04/15/2019 Last OV (acute) with Allie Bossier on 08/04/2019 Future OV scheduled on 10/18/2019

## 2019-09-07 NOTE — Telephone Encounter (Signed)
Refills sent to pharmacy. 

## 2019-09-09 ENCOUNTER — Inpatient Hospital Stay: Payer: Medicare Other | Attending: Oncology

## 2019-09-09 ENCOUNTER — Inpatient Hospital Stay: Payer: Medicare Other

## 2019-09-09 ENCOUNTER — Other Ambulatory Visit: Payer: Self-pay

## 2019-09-09 VITALS — BP 151/68 | HR 56

## 2019-09-09 DIAGNOSIS — Z79899 Other long term (current) drug therapy: Secondary | ICD-10-CM | POA: Insufficient documentation

## 2019-09-09 DIAGNOSIS — D631 Anemia in chronic kidney disease: Secondary | ICD-10-CM

## 2019-09-09 DIAGNOSIS — I129 Hypertensive chronic kidney disease with stage 1 through stage 4 chronic kidney disease, or unspecified chronic kidney disease: Secondary | ICD-10-CM | POA: Insufficient documentation

## 2019-09-09 DIAGNOSIS — N183 Chronic kidney disease, stage 3 unspecified: Secondary | ICD-10-CM

## 2019-09-09 LAB — HEMOGLOBIN AND HEMATOCRIT, BLOOD
HCT: 29.1 % — ABNORMAL LOW (ref 39.0–52.0)
Hemoglobin: 9.9 g/dL — ABNORMAL LOW (ref 13.0–17.0)

## 2019-09-09 MED ORDER — EPOETIN ALFA-EPBX 10000 UNIT/ML IJ SOLN
20000.0000 [IU] | Freq: Once | INTRAMUSCULAR | Status: AC
  Administered 2019-09-09: 20000 [IU] via SUBCUTANEOUS

## 2019-09-09 MED ORDER — EPOETIN ALFA-EPBX 40000 UNIT/ML IJ SOLN
20000.0000 [IU] | Freq: Once | INTRAMUSCULAR | Status: DC
  Filled 2019-09-09: qty 1

## 2019-09-13 ENCOUNTER — Encounter: Payer: Self-pay | Admitting: Dermatology

## 2019-09-28 DIAGNOSIS — H40053 Ocular hypertension, bilateral: Secondary | ICD-10-CM | POA: Diagnosis not present

## 2019-10-08 ENCOUNTER — Other Ambulatory Visit: Payer: Self-pay

## 2019-10-08 ENCOUNTER — Inpatient Hospital Stay: Payer: Medicare Other | Attending: Oncology

## 2019-10-08 ENCOUNTER — Inpatient Hospital Stay: Payer: Medicare Other

## 2019-10-08 DIAGNOSIS — I129 Hypertensive chronic kidney disease with stage 1 through stage 4 chronic kidney disease, or unspecified chronic kidney disease: Secondary | ICD-10-CM | POA: Insufficient documentation

## 2019-10-08 DIAGNOSIS — D631 Anemia in chronic kidney disease: Secondary | ICD-10-CM | POA: Diagnosis not present

## 2019-10-08 DIAGNOSIS — N183 Chronic kidney disease, stage 3 unspecified: Secondary | ICD-10-CM | POA: Insufficient documentation

## 2019-10-08 LAB — HEMOGLOBIN AND HEMATOCRIT, BLOOD
HCT: 30.2 % — ABNORMAL LOW (ref 39.0–52.0)
Hemoglobin: 10.3 g/dL — ABNORMAL LOW (ref 13.0–17.0)

## 2019-10-18 ENCOUNTER — Ambulatory Visit: Payer: Medicare Other | Admitting: Primary Care

## 2019-10-20 ENCOUNTER — Ambulatory Visit (INDEPENDENT_AMBULATORY_CARE_PROVIDER_SITE_OTHER): Payer: Medicare Other | Admitting: Internal Medicine

## 2019-10-20 ENCOUNTER — Other Ambulatory Visit: Payer: Self-pay

## 2019-10-20 ENCOUNTER — Encounter: Payer: Self-pay | Admitting: Internal Medicine

## 2019-10-20 VITALS — BP 154/56 | HR 61 | Ht 70.0 in | Wt 155.0 lb

## 2019-10-20 DIAGNOSIS — E785 Hyperlipidemia, unspecified: Secondary | ICD-10-CM | POA: Diagnosis not present

## 2019-10-20 DIAGNOSIS — I25118 Atherosclerotic heart disease of native coronary artery with other forms of angina pectoris: Secondary | ICD-10-CM | POA: Diagnosis not present

## 2019-10-20 DIAGNOSIS — I10 Essential (primary) hypertension: Secondary | ICD-10-CM

## 2019-10-20 DIAGNOSIS — I739 Peripheral vascular disease, unspecified: Secondary | ICD-10-CM

## 2019-10-20 DIAGNOSIS — I453 Trifascicular block: Secondary | ICD-10-CM

## 2019-10-20 MED ORDER — CARVEDILOL 3.125 MG PO TABS: 3.1250 mg | ORAL_TABLET | Freq: Two times a day (BID) | ORAL | 2 refills | Status: DC

## 2019-10-20 NOTE — Patient Instructions (Signed)
Medication Instructions:  Your physician has recommended you make the following change in your medication:  1- DECREASE Carvedilol to 3.125 mg by mouth two times a day.  Please let us know if your blood pressure is running consistently greater than 140's/90's.   *If you need a refill on your cardiac medications before your next appointment, please call your pharmacy*  Follow-Up: At [facility], you and your health needs are our priority.  As part of our continuing mission to provide you with exceptional heart care, we have created designated Provider Care Teams.  These Care Teams include your primary Cardiologist (physician) and Advanced Practice Providers (APPs -  Physician Assistants and Nurse Practitioners) who all work together to provide you with the care you need, when you need it.  We recommend signing up for the patient portal called "MyChart".  Sign up information is provided on this After Visit Summary.  MyChart is used to connect with patients for Virtual Visits (Telemedicine).  Patients are able to view lab/test results, encounter notes, upcoming appointments, etc.  Non-urgent messages can be sent to your provider as well.   To learn more about what you can do with MyChart, go to [email or web address].    Your next appointment:   3 month(s)  The format for your next appointment:   In Person  Provider:    You may see Nelva Bush, MD or one of the following Advanced Practice Providers on your designated Care Team:    Murray Hodgkins, NP  Christell Faith, PA-C  Marrianne Mood, PA-C

## 2019-10-20 NOTE — Progress Notes (Signed)
Follow-up Outpatient Visit Date: 10/20/2019  Primary Care Provider: Pleas Koch, NP [place] [address]  Chief Complaint: Follow-up CAD and PAD  HPI:  Tommy [name] is a 86 y.o. male with history of coronary artery disease status post remote CABG with stable angina, PAD, carotid artery stenosis status post left CEA, hypertension, hyperlipidemia, type 2 diabetes mellitus,CKD, and chronic anemia, who presents for follow-up of coronary artery and peripheral vascular disease.  I last saw him in April for reevaluation of accelerating angina.  At that time, [name] reported resolution of his chest pain.  Due to intermittent trifascicular block, we agreed to obtain a 14-day event monitor to exclude episodes of high-grade AV block that could be contributing to [name]'s intermittent fatigue.  No significant arrhythmia was identified.  Today, [name] reports that he continues to do well.  He had a single episode of nocturnal chest pain that required nitroglycerin while wearing the ambulatory event monitor in June.  He otherwise has not had any angina.  He still exercises regularly without limitations.  He has not had any palpitations, lightheadedness, dyspnea, or edema.  --------------------------------------------------------------------------------------------------  Cardiovascular History & Procedures: Cardiovascular Problems:  Coronary artery disease status post CABG (1994)  Carotid artery stenosis status post left carotid endarterectomy (2004)  Abdominal aortic aneurysm  Risk Factors:  Known coronary artery disease, hypertension, hyperlipidemia, diabetes mellitus, male gender, and age  Cath/PCI:  LHC (02/12/16): Severe native disease with chronic total occlusions of the ostial LAD and LCx as well as 99% stenosis of distal LMCA. 80% ostial RCA stenosis as well as diffuse mid RCA with tandem lesions of 85-99%. Patent LIMA to LAD and SVG to distal RCA.  Additional SVG to unknown vessel is chronically occluded.  CV Surgery:  CABG (1994, [place], [place]): LIMA to LAD, SVG to distal RCA, and SVG to other unknown vessel.  Left carotid endarterectomy (2004)  EP Procedures and Devices:  14-day event monitor (08/03/2019): Predominantly sinus rhythm with rare PAC's and PVC's, as well as two brief atrial runs.  Non-Invasive Evaluation(s):  Aortoiliac duplex (06/03/2019): Mild abdominal aortic dilation, measuring up to 3.4 cm.  Carotid Doppler (02/08/2019): 1-39% stenosis in both internal carotid arteries.  Left carotid endarterectomy site patent without significant narrowing.  Antegrade vertebral artery flow bilaterally.  Right subclavian artery flow suggested stenosis.  TTE (02/06/17): Normal LV size. LVEF 55-60% with normal wall motion. Normal diastolic function. Aortic sclerosis without stenosis. Mild MR. Mild left atrial enlargement. Moderate TR. Upper normal to mildly elevated pulmonary artery pressure.  Carotid Doppler (02/06/17): Mild narrowing of bilateral internal carotid arteries (less than 40%. Less than 50% stenosis involving the right common carotid artery. Antegrade vertebral artery flow bilaterally. Normal flow in the subclavian arteries bilaterally.  Carotid artery Doppler (09/14/15): Moderate plaque in the right ICA with less than 50% stenosis.  Pharmacologic MPI (12/16/13): Mild inferior ischemia with LVEF of 58%.  TTE (12/02/13): Mild LVH with LVEF of 65% and grade 1 diastolic dysfunction. Aortic sclerosis, mild TR, and moderate pulmonary hypertension. Question PFO.  Aortoiliac ultrasound (08/09/13): No evidence of AAA. Moderately elevated right common iliac artery velocity with 50-75% stenosis.  Recent CV Pertinent Labs: Lab Results  Component Value Date   CHOL 129 04/20/2019   HDL 44.50 04/20/2019   LDLCALC 50 04/20/2019   TRIG 176.0 (H) 04/20/2019   CHOLHDL 3 04/20/2019   K 5.4 (H) 06/08/2019   BUN 41 (H)  06/08/2019   CREATININE 2.02 (H) 06/08/2019  Past medical and surgical history were reviewed and updated in EPIC.  Current Meds  Medication Sig  . aspirin 81 MG tablet Take 81 mg by mouth every evening.   Marland Kitchen atorvastatin (LIPITOR) 40 MG tablet TAKE 1 TABLET BY MOUTH  DAILY  . betamethasone valerate (VALISONE) 0.1 % cream APPLY TO AFFECTED AREA TWICE A DAY  . Blood Glucose Monitoring Suppl (ONETOUCH VERIO) w/Device KIT 1 Device by Does not apply route daily. Use as instructed to test blood sugar daily  . CALCIUM PO Take 600 mg by mouth every morning.   . cholecalciferol (VITAMIN D3) 25 MCG (1000 UT) tablet Take 1,000 Units by mouth daily.  Marland Kitchen epoetin alfa (EPOGEN,PROCRIT) 11572 UNIT/ML injection 10,000 Units. Every 2 to 4 weeks  . furosemide (LASIX) 40 MG tablet TAKE 1 TABLET BY MOUTH  DAILY  . glimepiride (AMARYL) 4 MG tablet TAKE 1 TABLET BY MOUTH  DAILY WITH BREAKFAST  . Hydrocortisone (PROCTOSOL HC RE) Place rectally as needed.  . Insulin Glargine (LANTUS) 100 UNIT/ML Solostar Pen Inject 10 Units into the skin daily.  . Insulin Pen Needle 29G X 12.7MM MISC BD Ultrafine Pen Needle Use as instructed to inject insulin daily  . IRON PO Take by mouth. 2 QAM 1 QPM  . Lancets (ONETOUCH DELICA PLUS [id number]) MISC USE AS INSTRUCT TO TEST BLOOD SUGAR ONCE DAILY  . levothyroxine (SYNTHROID) 175 MCG tablet TAKE 1 TABLET BY MOUTH IN  THE MORNING ON AN EMPTY  STOMACH WITH A FULL GLASS  OF WATER  . Multiple Vitamin (MULTIVITAMIN) capsule Take 1 capsule by mouth daily.  . Omega-3 Fatty Acids (FISH OIL PO) Take 1,200 mg by mouth 4 (four) times daily.  Glory Rosebush VERIO test strip USE AS INSTRUCTED TO TEST BLOOD SUGAR ONCE DAILY  . pantoprazole (PROTONIX) 20 MG tablet TAKE 1 TABLET BY MOUTH  DAILY  . ranolazine (RANEXA) 1000 MG SR tablet Take 1 tablet (1,000 mg total) by mouth 2 (two) times daily.  . timolol (TIMOPTIC) 0.5 % ophthalmic solution APPLY 1 DROP(S) IN BOTH EYES ONCE A DAY  . [DISCONTINUED]  carvedilol (COREG) 6.25 MG tablet TAKE 1 TABLET BY MOUTH  TWICE DAILY WITH MEALS    Allergies: Heparin, Patiromer, and Sodium zirconium cyclosilicate  Social History   Tobacco Use  . Smoking status: Former Smoker    Packs/day: 1.50    Years: 25.00    Pack years: 37.50    Types: Cigarettes    Quit date: 1978    Years since quitting: 43.6  . Smokeless tobacco: Never Used  Vaping Use  . Vaping Use: Never used  Substance Use Topics  . Alcohol use: Yes    Alcohol/week: 1.0 standard drink    Types: 1 Glasses of wine per week    Comment: socially   . Drug use: No    Family History  Problem Relation Age of Onset  . Heart attack Mother   . Heart disease Mother   . Stroke Mother   . Diabetes Father   . Coronary artery disease Sister     Review of Systems: A 12-system review of systems was performed and was negative except as noted in the HPI.  --------------------------------------------------------------------------------------------------  Physical Exam: BP (!) 154/56 (BP Location: Left Arm, Patient Position: Sitting, Cuff Size: Normal)   Pulse 61   Ht 5' 10" (1.778 m)   Wt 155 lb (70.3 kg)   SpO2 98%   BMI 22.24 kg/m   General: NAD. Lungs: Clear to auscultation without  wheezes or crackles. Heart: Regular rate and rhythm without murmurs, rubs, or gallops. Abdomen: Soft, nontender, nondistended. Extremities: No lower extremity edema.  EKG: Normal sinus rhythm versus ectopic atrial rhythm with trifascicular block (RBBB, LAFB, and first-degree AV block).  Borderline LVH noted as well as poor R wave progression in the lateral precordial leads.  Compared with prior tracing from 06/23/2019, poor R wave progression in the lateral precordial leads more pronounced.  PR interval has also lengthened.  Lab Results  Component Value Date   WBC 5.9 08/11/2019   HGB 10.3 (L) 10/08/2019   HCT 30.2 (L) 10/08/2019   MCV 102.4 (H) 08/11/2019   PLT 151 08/11/2019    Lab Results    Component Value Date   NA 135 06/08/2019   K 5.4 (H) 06/08/2019   CL 100 06/08/2019   CO2 22 06/08/2019   BUN 41 (H) 06/08/2019   CREATININE 2.02 (H) 06/08/2019   GLUCOSE 95 06/08/2019   ALT 22 04/20/2019    Lab Results  Component Value Date   CHOL 129 04/20/2019   HDL 44.50 04/20/2019   LDLCALC 50 04/20/2019   TRIG 176.0 (H) 04/20/2019   CHOLHDL 3 04/20/2019    --------------------------------------------------------------------------------------------------  ASSESSMENT AND PLAN: Coronary artery disease with stable angina: Mr. Sahr continues to have episodes of nocturnal angina responsive to sublingual nitroglycerin.  He is exercising regularly without difficulty.  In light of his CKD and advanced age, we have agreed to defer coronary angiography in the setting of stable symptoms.  We will continue continue ranolazine 1000 mg twice daily.  In the setting of trifascicular block, I have recommended decreasing carvedilol to 3.125 mg twice daily.  If he has worsening angina, this may need to be increased back to 6.25 mg twice daily knowing that risk for further progression of conduction disease exists.  Trifascicular block: EKG shows trifascicular block today.  Recent event monitor did not show any significant bradycardia or high-grade AV block.  As above, we will plan to decrease carvedilol to 3.125 mg twice daily.  PAD: Asymptomatic.  Continue secondary prevention.  Hyperlipidemia: LDL at goal.  Continue atorvastatin 40 mg daily.  Hypertension: Blood blood pressure mildly elevated today but typically better at home.  I have asked Mr. Scally to watch his blood pressure closely with de-escalation of carvedilol.  Follow-up: Return to clinic in 3 months.  Nelva Bush, MD 10/20/2019 9:35 PM

## 2019-10-21 ENCOUNTER — Other Ambulatory Visit: Payer: Self-pay | Admitting: Primary Care

## 2019-10-26 ENCOUNTER — Ambulatory Visit: Payer: Medicare Other | Admitting: Primary Care

## 2019-10-28 ENCOUNTER — Other Ambulatory Visit: Payer: Self-pay

## 2019-10-28 ENCOUNTER — Ambulatory Visit (INDEPENDENT_AMBULATORY_CARE_PROVIDER_SITE_OTHER): Payer: Medicare Other | Admitting: Primary Care

## 2019-10-28 VITALS — BP 122/66 | HR 63 | Temp 96.0°F | Ht 70.0 in | Wt 158.5 lb

## 2019-10-28 DIAGNOSIS — R159 Full incontinence of feces: Secondary | ICD-10-CM

## 2019-10-28 DIAGNOSIS — E119 Type 2 diabetes mellitus without complications: Secondary | ICD-10-CM | POA: Diagnosis not present

## 2019-10-28 DIAGNOSIS — Z794 Long term (current) use of insulin: Secondary | ICD-10-CM

## 2019-10-28 DIAGNOSIS — I25118 Atherosclerotic heart disease of native coronary artery with other forms of angina pectoris: Secondary | ICD-10-CM

## 2019-10-28 HISTORY — DX: Full incontinence of feces: R15.9

## 2019-10-28 LAB — POCT GLYCOSYLATED HEMOGLOBIN (HGB A1C): Hemoglobin A1C: 6.2 % — AB (ref 4.0–5.6)

## 2019-10-28 NOTE — Assessment & Plan Note (Signed)
Two occurences over the last 2-3 months. No other symptoms or recent antibiotic use.  Recommended he try Metamucil or fiber agent to help bulk stools.  Referral placed to GI.

## 2019-10-28 NOTE — Progress Notes (Signed)
Subjective:    Patient ID: Tommy [name], male    DOB: April 08, 1926, 86 y.o.   MRN: [id number]  HPI  This visit occurred during the SARS-CoV-2 public health emergency.  Safety protocols were in place, including screening questions prior to the visit, additional usage of staff PPE, and extensive cleaning of exam room while observing appropriate contact time as indicated for disinfecting solutions.   Tommy [name] is a 86 year old male with a history of CAD, carotid artery disease, PAD, type 2 diabetes, hypothyroidism, CKD stage III, chronic anemia who presents today for follow up of diabetes.   1) Fecal Incontinence: He would also like to discuss fecal incontinence. A few months ago he was walking and felt the urge to defecate, unfortunately he didn't make it to the bathroom in time and had diarrhea in his underwear. Three weeks ago he was sleeping and woke up to the urge to defecate again, could not get up in time and had diarrhea in his bed. He denies changes in food, medications. He denies rectal bleeding, nausea, abdominal pain, recent antibiotic use. He does take Metamucil on occasion for constipation.    2) Type 2 Diabetes: Current medications include: Glimepiride 4 mg daily, Lantus 8 units. He prefers to continue insulin, has mentioned this during numerous visits.   He is checking his blood glucose 1 times daily and is getting readings of:  AM fasting: low 100's, sometimes 70's-90's.   Lowest reading of 66 that occurred once.   Last A1C: 6.1 in May 2021, 6.2 today Last Eye Exam: UTD Last Foot Exam: Due Pneumonia Vaccination: UTD, last completed in 2019 ACE/ARB: None.  Statin: atorvastatin   BP Readings from Last 3 Encounters:  10/28/19 122/66  10/20/19 (!) 154/56  09/09/19 (!) 151/68      Review of Systems  Constitutional: Negative for fever.  Eyes: Negative for visual disturbance.  Respiratory: Negative for shortness of breath.   Cardiovascular: Negative for chest pain.    Gastrointestinal: Positive for diarrhea. Negative for abdominal pain, blood in stool, nausea and rectal pain.  Neurological: Negative for dizziness and headaches.       Past Medical History:  Diagnosis Date  . Anemia   . Carotid artery occlusion    a. 2004 s/p L carotid endarterectomy; b. 02/2019 U/S: 1-39% bilat ICA stenosis. <50% bilat CCA stenosis.  . CKD (chronic kidney disease) stage 3, GFR 30-59 ml/min   . Coronary artery disease    a. 1994 s/p CABG x 3, [place], [place] (LIMA->LAD, VG->dRCA, VG->unknown vessel); b. 02/2016 Cath: LM 99d, LAD 100ost, LCX 100ost, RCA 80ost, diffuse 85-89m LIMA->LAD ok, VG->RCA ok. VG->unknown vessel 100.  . Diabetes mellitus without complication ([place]   . Dyslipidemia   . GERD (gastroesophageal reflux disease)   . Hemorrhoids   . 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 History of echocardiogram    a. 02/2017 Echo: EF 55-65%, no rwma. Nl DD. Ao sclerosis w/o stenosis. Mod TR. Mild MR.  .Marland KitchenHypertension   . Pulmonary embolism ([facility]   . Thyroid disease      Social History   Socioeconomic History  . Marital status: Widowed    Spouse name: Not on file  . Number of children: 4  . Years of education: 16  . Highest education level: Bachelor's degree (e.g., BA, AB, BS)  Occupational History  . Occupation: retired  Tobacco Use  . Smoking status: Former Smoker    Packs/day: 1.50    Years: 25.00  Pack years: 37.50    Types: Cigarettes    Quit date: 1978    Years since quitting: 43.6  . Smokeless tobacco: Never Used  Vaping Use  . Vaping Use: Never used  Substance and Sexual Activity  . Alcohol use: Yes    Alcohol/week: 1.0 standard drink    Types: 1 Glasses of wine per week    Comment: socially   . Drug use: No  . Sexual activity: Not Currently  Other Topics Concern  . Not on file  Social History Narrative   Widower.   4 children, 7 grandchildren, 1 great grandchild.   Retired. Once worked as an account.    Enjoys exercising,  spending time with family.    Social Determinants of Health   Financial Resource Strain:   . Difficulty of Paying Living Expenses: Not on file  Food Insecurity:   . Worried About Charity fundraiser in the Last Year: Not on file  . Ran Out of Food in the Last Year: Not on file  Transportation Needs:   . Lack of Transportation (Medical): Not on file  . Lack of Transportation (Non-Medical): Not on file  Physical Activity:   . Days of Exercise per Week: Not on file  . Minutes of Exercise per Session: Not on file  Stress:   . Feeling of Stress : Not on file  Social Connections:   . Frequency of Communication with Friends and Family: Not on file  . Frequency of Social Gatherings with Friends and Family: Not on file  . Attends Religious Services: Not on file  . Active Member of Clubs or Organizations: Not on file  . Attends Archivist Meetings: Not on file  . Marital Status: Not on file  Intimate Partner Violence:   . Fear of Current or Ex-Partner: Not on file  . Emotionally Abused: Not on file  . Physically Abused: Not on file  . Sexually Abused: Not on file    Past Surgical History:  Procedure Laterality Date  . CAROTID ENDARTERECTOMY Left 2004  . CORONARY ARTERY BYPASS GRAFT  1994   [name]  . TONSILLECTOMY AND ADENOIDECTOMY      Family History  Problem Relation Age of Onset  . Heart attack Mother   . Heart disease Mother   . Stroke Mother   . Diabetes Father   . Coronary artery disease Sister     Allergies  Allergen Reactions  . Heparin Other (See Comments)  . Patiromer Other (See Comments)  . Sodium Zirconium Cyclosilicate Other (See Comments)    Current Outpatient Medications on File Prior to Visit  Medication Sig Dispense Refill  . aspirin 81 MG tablet Take 81 mg by mouth every evening.     Marland Kitchen atorvastatin (LIPITOR) 40 MG tablet TAKE 1 TABLET BY MOUTH  DAILY 90 tablet 1  . Bacillus Coagulans-Inulin (ALIGN PREBIOTIC-PROBIOTIC PO) Take by  mouth.    . betamethasone valerate (VALISONE) 0.1 % cream APPLY TO AFFECTED AREA TWICE A DAY 30 g 0  . Blood Glucose Monitoring Suppl (ONETOUCH VERIO) w/Device KIT 1 Device by Does not apply route daily. Use as instructed to test blood sugar daily 1 kit 0  . CALCIUM PO Take 600 mg by mouth every morning.     . carvedilol (COREG) 3.125 MG tablet Take 1 tablet (3.125 mg total) by mouth 2 (two) times daily. 180 tablet 2  . cholecalciferol (VITAMIN D3) 25 MCG (1000 UT) tablet Take 1,000 Units by mouth daily.    Marland Kitchen  epoetin alfa (EPOGEN,PROCRIT) 86767 UNIT/ML injection 10,000 Units. Every 2 to 4 weeks    . furosemide (LASIX) 40 MG tablet TAKE 1 TABLET BY MOUTH  DAILY 90 tablet 2  . glimepiride (AMARYL) 4 MG tablet TAKE 1 TABLET BY MOUTH  DAILY WITH BREAKFAST 120 tablet 1  . Hydrocortisone (PROCTOSOL HC RE) Place rectally as needed.    . Insulin Glargine (LANTUS) 100 UNIT/ML Solostar Pen Inject 10 Units into the skin daily.    . Insulin Pen Needle 29G X 12.7MM MISC BD Ultrafine Pen Needle Use as instructed to inject insulin daily 300 each 1  . IRON PO Take by mouth. 2 QAM 1 QPM    . Lancets (ONETOUCH DELICA PLUS [id number]) MISC USE AS INSTRUCT TO TEST BLOOD SUGAR ONCE DAILY 100 each 5  . levothyroxine (SYNTHROID) 175 MCG tablet TAKE 1 TABLET BY MOUTH IN  THE MORNING ON AN EMPTY  STOMACH WITH A FULL GLASS  OF WATER 120 tablet 1  . Multiple Vitamin (MULTIVITAMIN) capsule Take 1 capsule by mouth daily.    . Nutritional Supplements (JUICE PLUS FIBRE PO) Take by mouth.    . Omega-3 Fatty Acids (FISH OIL PO) Take 1,200 mg by mouth 4 (four) times daily.    Glory Rosebush VERIO test strip USE AS INSTRUCTED TO TEST BLOOD SUGAR ONCE DAILY 100 strip 5  . pantoprazole (PROTONIX) 20 MG tablet TAKE 1 TABLET BY MOUTH  DAILY 90 tablet 1  . ranolazine (RANEXA) 1000 MG SR tablet Take 1 tablet (1,000 mg total) by mouth 2 (two) times daily. 180 tablet 3  . timolol (TIMOPTIC) 0.5 % ophthalmic solution APPLY 1 DROP(S) IN BOTH  EYES ONCE A DAY  11  . nitroGLYCERIN (NITROSTAT) 0.4 MG SL tablet Place 1 tablet (0.4 mg total) under the tongue every 5 (five) minutes as needed for chest pain. 25 tablet 3   No current facility-administered medications on file prior to visit.    BP 122/66   Pulse 63   Temp (!) 96 F (35.6 C) (Temporal)   Ht _0  (1.778 m)   Wt 158 lb 8 oz (71.9 kg)   SpO2 98%   BMI 22.74 kg/m    Objective:   Physical Exam Cardiovascular:     Rate and Rhythm: Normal rate and regular rhythm.  Pulmonary:     Effort: Pulmonary effort is normal.     Breath sounds: Normal breath sounds.  Musculoskeletal:     Cervical back: Neck supple.  Skin:    General: Skin is warm and dry.            Assessment & Plan:

## 2019-10-28 NOTE — Assessment & Plan Note (Signed)
Well controlled with A1C of 6.2 today.  Again we discussed that he likely does not need to be on insulin, but he prefers to continued despite readings in the 70's-90's.   He did agree to reduce insulin to 6 units.  Continue glimepiride for now.   Foot exam today. Eye exam UTD. Pneumonia vaccination UTD.  Follow up in 6 months.

## 2019-10-28 NOTE — Patient Instructions (Signed)
Reduce your insulin to 6 units as discussed.  You will be contacted regarding your referral to GI.  Please let us know if you have not been contacted within two weeks.   Try taking Metamucil everyday to help bulk up your stool.  Please schedule a follow up appointment in 6 months for your annual follow up visit.  It was a pleasure to see you today!

## 2019-10-29 ENCOUNTER — Telehealth: Payer: Self-pay | Admitting: Internal Medicine

## 2019-10-29 ENCOUNTER — Other Ambulatory Visit: Payer: Self-pay | Admitting: Primary Care

## 2019-10-29 DIAGNOSIS — R159 Full incontinence of feces: Secondary | ICD-10-CM

## 2019-10-29 NOTE — Telephone Encounter (Signed)
   Primary Cardiologist: Nelva Bush, MD  Chart reviewed as part of pre-operative protocol coverage. Given past medical history and time since last visit, based on ACC/AHA guidelines, Mattheo Swindle would be at acceptable risk for the planned procedure without further cardiovascular testing.   He is not on anticoagulation or antiplatelet medication with the exception of aspirin. Dental procedures are low risk from CV standpoint. Does not need SBE prophylaxis.  I will route this recommendation to the requesting party via Epic fax function and remove from pre-op pool. Please call with questions.  Phill Myron. West Pugh, ANP, AACC  10/29/2019, 11:37 AM

## 2019-10-29 NOTE — Telephone Encounter (Signed)
Please contact requesting office for more detail about the surgery/"dental treatment".  We do not provide blanket cardiac clearance.  Thank you.

## 2019-10-29 NOTE — Telephone Encounter (Signed)
Spoke with Miranda at [facility], she stated pt will be having fillings done, not sure many at this point, she also states pt will also need teeth extraction but that will not be done at her office.

## 2019-10-29 NOTE — Telephone Encounter (Signed)
   [facility] Pre-operative Risk Assessment    HEARTCARE STAFF: - Please ensure there is not already an duplicate clearance open for this procedure. - Under Visit Info/Reason for Call, type in Other and utilize the format Clearance MM/DD/YY or Clearance TBD. Do not use dashes or single digits. - If request is for dental extraction, please clarify the # of teeth to be extracted.  Request for surgical clearance:  1. What type of surgery is being performed? Dental treament  2. When is this surgery scheduled? TBD  3. What type of clearance is required (medical clearance vs. Pharmacy clearance to hold med vs. Both)? Not listed  4. Are there any medications that need to be held prior to surgery and how long? Not listed 5. Practice name and name of physician performing surgery? [facility], Dr. Martinique Thomas  6. What is the office phone number? [phone]   7.   What is the office fax number? [phone]  8.   Anesthesia type (None, local, MAC, general) ? Not listed   Elissa Hefty 10/29/2019, 9:56 AM  _________________________________________________________________   (provider comments below)

## 2019-11-01 ENCOUNTER — Other Ambulatory Visit: Payer: Medicare Other

## 2019-11-01 ENCOUNTER — Telehealth: Payer: Self-pay | Admitting: Primary Care

## 2019-11-01 ENCOUNTER — Other Ambulatory Visit: Payer: Self-pay | Admitting: Primary Care

## 2019-11-01 DIAGNOSIS — R197 Diarrhea, unspecified: Secondary | ICD-10-CM | POA: Diagnosis not present

## 2019-11-01 DIAGNOSIS — R159 Full incontinence of feces: Secondary | ICD-10-CM

## 2019-11-01 DIAGNOSIS — I2510 Atherosclerotic heart disease of native coronary artery without angina pectoris: Secondary | ICD-10-CM

## 2019-11-01 NOTE — Telephone Encounter (Signed)
Tommy [name], it appears that this may have been in regards to the GI referral I placed last week. Can you help him?

## 2019-11-01 NOTE — Telephone Encounter (Signed)
Patient came in office stating he received a call about needing an appointment form [place] but he isnt sure what department called him. Please follow up with patient.

## 2019-11-04 LAB — GASTROINTESTINAL PATHOGEN PANEL PCR
C. difficile Tox A/B, PCR: NOT DETECTED
Campylobacter, PCR: NOT DETECTED
Cryptosporidium, PCR: NOT DETECTED
E coli (ETEC) LT/ST PCR: NOT DETECTED
E coli (STEC) stx1/stx2, PCR: NOT DETECTED
E coli 0157, PCR: NOT DETECTED
Giardia lamblia, PCR: NOT DETECTED
Norovirus, PCR: NOT DETECTED
Rotavirus A, PCR: NOT DETECTED
Salmonella, PCR: NOT DETECTED
Shigella, PCR: NOT DETECTED

## 2019-11-10 ENCOUNTER — Inpatient Hospital Stay: Payer: Medicare Other

## 2019-11-10 ENCOUNTER — Other Ambulatory Visit: Payer: Self-pay

## 2019-11-10 ENCOUNTER — Encounter: Payer: Self-pay | Admitting: Oncology

## 2019-11-10 ENCOUNTER — Inpatient Hospital Stay (HOSPITAL_BASED_OUTPATIENT_CLINIC_OR_DEPARTMENT_OTHER): Payer: Medicare Other | Admitting: Oncology

## 2019-11-10 ENCOUNTER — Inpatient Hospital Stay: Payer: Medicare Other | Attending: Oncology

## 2019-11-10 VITALS — BP 161/69 | HR 64 | Temp 96.1°F | Resp 16 | Wt 158.0 lb

## 2019-11-10 DIAGNOSIS — I2510 Atherosclerotic heart disease of native coronary artery without angina pectoris: Secondary | ICD-10-CM | POA: Diagnosis not present

## 2019-11-10 DIAGNOSIS — Z87891 Personal history of nicotine dependence: Secondary | ICD-10-CM | POA: Diagnosis not present

## 2019-11-10 DIAGNOSIS — N183 Chronic kidney disease, stage 3 unspecified: Secondary | ICD-10-CM | POA: Diagnosis not present

## 2019-11-10 DIAGNOSIS — E785 Hyperlipidemia, unspecified: Secondary | ICD-10-CM | POA: Diagnosis not present

## 2019-11-10 DIAGNOSIS — D631 Anemia in chronic kidney disease: Secondary | ICD-10-CM

## 2019-11-10 DIAGNOSIS — E119 Type 2 diabetes mellitus without complications: Secondary | ICD-10-CM | POA: Insufficient documentation

## 2019-11-10 DIAGNOSIS — Z7982 Long term (current) use of aspirin: Secondary | ICD-10-CM | POA: Diagnosis not present

## 2019-11-10 DIAGNOSIS — I129 Hypertensive chronic kidney disease with stage 1 through stage 4 chronic kidney disease, or unspecified chronic kidney disease: Secondary | ICD-10-CM | POA: Insufficient documentation

## 2019-11-10 DIAGNOSIS — K219 Gastro-esophageal reflux disease without esophagitis: Secondary | ICD-10-CM | POA: Insufficient documentation

## 2019-11-10 DIAGNOSIS — E079 Disorder of thyroid, unspecified: Secondary | ICD-10-CM | POA: Insufficient documentation

## 2019-11-10 DIAGNOSIS — Z86711 Personal history of pulmonary embolism: Secondary | ICD-10-CM | POA: Insufficient documentation

## 2019-11-10 DIAGNOSIS — D696 Thrombocytopenia, unspecified: Secondary | ICD-10-CM | POA: Insufficient documentation

## 2019-11-10 LAB — CBC WITH DIFFERENTIAL/PLATELET
Abs Immature Granulocytes: 0.04 10*3/uL (ref 0.00–0.07)
Basophils Absolute: 0 10*3/uL (ref 0.0–0.1)
Basophils Relative: 1 %
Eosinophils Absolute: 0.1 10*3/uL (ref 0.0–0.5)
Eosinophils Relative: 2 %
HCT: 29.6 % — ABNORMAL LOW (ref 39.0–52.0)
Hemoglobin: 10.2 g/dL — ABNORMAL LOW (ref 13.0–17.0)
Immature Granulocytes: 1 %
Lymphocytes Relative: 22 %
Lymphs Abs: 1.3 10*3/uL (ref 0.7–4.0)
MCH: 34.8 pg — ABNORMAL HIGH (ref 26.0–34.0)
MCHC: 34.5 g/dL (ref 30.0–36.0)
MCV: 101 fL — ABNORMAL HIGH (ref 80.0–100.0)
Monocytes Absolute: 0.6 10*3/uL (ref 0.1–1.0)
Monocytes Relative: 10 %
Neutro Abs: 3.9 10*3/uL (ref 1.7–7.7)
Neutrophils Relative %: 64 %
Platelets: 148 10*3/uL — ABNORMAL LOW (ref 150–400)
RBC: 2.93 MIL/uL — ABNORMAL LOW (ref 4.22–5.81)
RDW: 12.3 % (ref 11.5–15.5)
WBC: 6.1 10*3/uL (ref 4.0–10.5)
nRBC: 0 % (ref 0.0–0.2)

## 2019-11-10 NOTE — Progress Notes (Signed)
Hematology/Oncology follow up note [facility] [phone] [phone]   Patient Care Team: Pleas Koch, NP as PCP - General (Internal Medicine) End, Harrell Gave, MD as PCP - Cardiology (Cardiology) Dingeldein, Remo Lipps, MD as Referring Physician (Ophthalmology) End, Harrell Gave, MD as Consulting Physician (Cardiology) Earlie Server, MD as Consulting Physician (Oncology)  REFERRING PROVIDER: Dr.Lateef REASON FOR VISIT Follow up for treatment of anemia of CKD, on erythropoietin replacement therapy.  HISTORY OF PRESENTING ILLNESS:  Tommy [name] is a  86 y.o.  male with PMH listed below who was referred to me for evaluation of anemia.  Patient recently removed from [place] to [place] to live with his son.  He has chronic kidney disease and anemia associated with CKD.  He reports that he has been getting Procrit 10,000 units every 2 weeks for many years.  Denies any recent thrombosis events.  Patient reports that at the last Procrit shot he received was back in September 2018 before he moved here. He establish care with Dr. Holley Raring for his CKD and he was referred to me for evaluation of anemia and possible Procrit shots.  Patient had lab work done at [facility] on 05/15/2017 hemoglobin 9.6, MCV 98, WBC 4.5, platelet count 1 70,000, normal differential use.   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 He has IVC filter in place.   INTERVAL HISTORY Tommy [name] is a 86 y.o. male who has above history reviewed by me today presents for follow-up for anemia due to CKD. Patient has been on erythropoietin therapy monthly if hemoglobin is less than 10. Last Retacrit injection was 2 months ago. Reports feeling well today.  Denies any chest pain, lower extremity swelling or pain. Chronic fatigue is at baseline.  No change He has no new complaints today. .   Review  of Systems  Constitutional: Negative for chills, fever, malaise/fatigue and weight loss.  HENT: Negative for sore throat.   Eyes: Negative for redness.  Respiratory: Negative for cough, shortness of breath and wheezing.   Cardiovascular: Negative for chest pain, palpitations and leg swelling.  Gastrointestinal: Negative for abdominal pain, blood in stool, nausea and vomiting.  Genitourinary: Negative for dysuria.  Musculoskeletal: Negative for myalgias.  Skin: Negative for rash.  Neurological: Negative for dizziness, tingling and tremors.  Endo/Heme/Allergies: Does not bruise/bleed easily.  Psychiatric/Behavioral: Negative for hallucinations.    MEDICAL HISTORY:  Past Medical History:  Diagnosis Date  . Anemia   . Carotid artery occlusion    a. 2004 s/p L carotid endarterectomy; b. 02/2019 U/S: 1-39% bilat ICA stenosis. <50% bilat CCA stenosis.  . CKD (chronic kidney disease) stage 3, GFR 30-59 ml/min   . Coronary artery disease    a. 1994 s/p CABG x 3, [place], [place] (LIMA->LAD, VG->dRCA, VG->unknown vessel); b. 02/2016 Cath: LM 99d, LAD 100ost, LCX 100ost, RCA 80ost, diffuse 85-71m. LIMA->LAD ok, VG->RCA ok. VG->unknown vessel 100.  . Diabetes mellitus without complication ([place])   . Dyslipidemia   . GERD (gastroesophageal reflux disease)   . Hemorrhoids   . 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 History of echocardiogram    a. 02/2017 Echo: EF 55-65%, no rwma. Nl DD. Ao sclerosis w/o stenosis. Mod TR. Mild MR.  Marland Kitchen Hypertension   . Pulmonary embolism ([place])   . Thyroid disease     SURGICAL HISTORY: Past Surgical History:  Procedure Laterality Date  . CAROTID ENDARTERECTOMY Left  2004  . CORONARY ARTERY BYPASS GRAFT  1994   [name]  . TONSILLECTOMY AND ADENOIDECTOMY      SOCIAL HISTORY: Social History   Socioeconomic History  . Marital status: Widowed    Spouse name: Not on file  . Number of children: 4  . Years of education: 16  . Highest education  level: Bachelor's degree (e.g., BA, AB, BS)  Occupational History  . Occupation: retired  Tobacco Use  . Smoking status: Former Smoker    Packs/day: 1.50    Years: 25.00    Pack years: 37.50    Types: Cigarettes    Quit date: 1978    Years since quitting: 43.7  . Smokeless tobacco: Never Used  Vaping Use  . Vaping Use: Never used  Substance and Sexual Activity  . Alcohol use: Yes    Alcohol/week: 1.0 standard drink    Types: 1 Glasses of wine per week    Comment: socially   . Drug use: No  . Sexual activity: Not Currently  Other Topics Concern  . Not on file  Social History Narrative   Widower.   4 children, 7 grandchildren, 1 great grandchild.   Retired. Once worked as an account.    Enjoys exercising, spending time with family.    Social Determinants of Health   Financial Resource Strain:   . Difficulty of Paying Living Expenses: Not on file  Food Insecurity:   . Worried About Charity fundraiser in the Last Year: Not on file  . Ran Out of Food in the Last Year: Not on file  Transportation Needs:   . Lack of Transportation (Medical): Not on file  . Lack of Transportation (Non-Medical): Not on file  Physical Activity:   . Days of Exercise per Week: Not on file  . Minutes of Exercise per Session: Not on file  Stress:   . Feeling of Stress : Not on file  Social Connections:   . Frequency of Communication with Friends and Family: Not on file  . Frequency of Social Gatherings with Friends and Family: Not on file  . Attends Religious Services: Not on file  . Active Member of Clubs or Organizations: Not on file  . Attends Archivist Meetings: Not on file  . Marital Status: Not on file  Intimate Partner Violence:   . Fear of Current or Ex-Partner: Not on file  . Emotionally Abused: Not on file  . Physically Abused: Not on file  . Sexually Abused: Not on file    FAMILY HISTORY: Family History  Problem Relation Age of Onset  . Heart attack Mother   .  Heart disease Mother   . Stroke Mother   . Diabetes Father   . Coronary artery disease Sister     ALLERGIES:  is allergic to heparin, patiromer, and sodium zirconium cyclosilicate.  MEDICATIONS:  Current Outpatient Medications  Medication Sig Dispense Refill  . aspirin 81 MG tablet Take 81 mg by mouth every evening.     Marland Kitchen atorvastatin (LIPITOR) 40 MG tablet TAKE 1 TABLET BY MOUTH  DAILY 90 tablet 3  . Bacillus Coagulans-Inulin (ALIGN PREBIOTIC-PROBIOTIC PO) Take by mouth.    . betamethasone valerate (VALISONE) 0.1 % cream APPLY TO AFFECTED AREA TWICE A DAY 30 g 0  . Blood Glucose Monitoring Suppl (ONETOUCH VERIO) w/Device KIT 1 Device by Does not apply route daily. Use as instructed to test blood sugar daily 1 kit 0  . CALCIUM PO Take 600 mg  by mouth every morning.     . carvedilol (COREG) 3.125 MG tablet Take 1 tablet (3.125 mg total) by mouth 2 (two) times daily. 180 tablet 2  . cholecalciferol (VITAMIN D3) 25 MCG (1000 UT) tablet Take 1,000 Units by mouth daily.    Marland Kitchen epoetin alfa (EPOGEN,PROCRIT) 02585 UNIT/ML injection 10,000 Units. Every 2 to 4 weeks    . furosemide (LASIX) 40 MG tablet TAKE 1 TABLET BY MOUTH  DAILY 90 tablet 2  . glimepiride (AMARYL) 4 MG tablet TAKE 1 TABLET BY MOUTH  DAILY WITH BREAKFAST 120 tablet 1  . Hydrocortisone (PROCTOSOL HC RE) Place rectally as needed.    . Insulin Glargine (LANTUS) 100 UNIT/ML Solostar Pen Inject 6 Units into the skin daily.    . Insulin Pen Needle 29G X 12.7MM MISC BD Ultrafine Pen Needle Use as instructed to inject insulin daily 300 each 1  . IRON PO Take by mouth. 2 QAM 1 QPM    . Lancets (ONETOUCH DELICA PLUS [id number]) MISC USE AS INSTRUCT TO TEST BLOOD SUGAR ONCE DAILY 100 each 5  . levothyroxine (SYNTHROID) 175 MCG tablet TAKE 1 TABLET BY MOUTH IN  THE MORNING ON AN EMPTY  STOMACH WITH A FULL GLASS  OF WATER 120 tablet 1  . Multiple Vitamin (MULTIVITAMIN) capsule Take 1 capsule by mouth daily.    . Nutritional Supplements (JUICE  PLUS FIBRE PO) Take by mouth.    . Omega-3 Fatty Acids (FISH OIL PO) Take 1,200 mg by mouth 4 (four) times daily.    Glory Rosebush VERIO test strip USE AS INSTRUCTED TO TEST BLOOD SUGAR ONCE DAILY 100 strip 5  . pantoprazole (PROTONIX) 20 MG tablet TAKE 1 TABLET BY MOUTH  DAILY 90 tablet 1  . ranolazine (RANEXA) 1000 MG SR tablet Take 1 tablet (1,000 mg total) by mouth 2 (two) times daily. 180 tablet 3  . timolol (TIMOPTIC) 0.5 % ophthalmic solution APPLY 1 DROP(S) IN BOTH EYES ONCE A DAY  11  . nitroGLYCERIN (NITROSTAT) 0.4 MG SL tablet Place 1 tablet (0.4 mg total) under the tongue every 5 (five) minutes as needed for chest pain. 25 tablet 3   No current facility-administered medications for this visit.     PHYSICAL EXAMINATION: ECOG PERFORMANCE STATUS: 1 - Symptomatic but completely ambulatory Vitals:   11/10/19 1336  BP: (!) 161/69  Pulse: 64  Resp: 16  Temp: (!) 96.1 F (35.6 C)   Filed Weights   11/10/19 1336  Weight: 158 lb (71.7 kg)    Physical Exam Constitutional:      General: He is not in acute distress.    Appearance: He is not diaphoretic.     Comments: Patient walks independently  HENT:     Head: Normocephalic and atraumatic.     Nose: Nose normal.     Mouth/Throat:     Pharynx: No oropharyngeal exudate.  Eyes:     General: No scleral icterus.    Pupils: Pupils are equal, round, and reactive to light.  Cardiovascular:     Rate and Rhythm: Normal rate and regular rhythm.     Heart sounds: No murmur heard.   Pulmonary:     Effort: Pulmonary effort is normal. No respiratory distress.     Breath sounds: No rales.  Chest:     Chest wall: No tenderness.  Abdominal:     General: There is no distension.     Palpations: Abdomen is soft.     Tenderness: There is no abdominal  tenderness.  Musculoskeletal:        General: Normal range of motion.     Cervical back: Normal range of motion and neck supple.  Skin:    General: Skin is warm and dry.     Findings: No  erythema.  Neurological:     Mental Status: He is alert and oriented to person, place, and time.     Cranial Nerves: No cranial nerve deficit.     Motor: No abnormal muscle tone.     Coordination: Coordination normal.  Psychiatric:        Mood and Affect: Affect normal.      LABORATORY DATA:  I have reviewed the data as listed Lab Results  Component Value Date   WBC 6.1 11/10/2019   HGB 10.2 (L) 11/10/2019   HCT 29.6 (L) 11/10/2019   MCV 101.0 (H) 11/10/2019   PLT 148 (L) 11/10/2019   Recent Labs    03/29/19 1222 03/29/19 1222 04/08/19 1111 04/20/19 1449 06/08/19 1750  NA 136   < > 133* 135 135  K 5.0   < > 5.2* 5.2* 5.4*  CL 101   < > 102 100 100  CO2 24   < > $R'23 29 22  'MX$ GLUCOSE 237*   < > 240* 161* 95  BUN 42*   < > 48* 34* 41*  CREATININE 1.67*   < > 1.86* 1.60* 2.02*  CALCIUM 8.9   < > 9.4 9.5 9.8  GFRNONAA 35*  --  31*  --  28*  GFRAA 41*  --  36*  --  33*  PROT  --   --   --  6.7  --   ALBUMIN  --   --   --  4.0  --   AST  --   --   --  22  --   ALT  --   --   --  22  --   ALKPHOS  --   --   --  80  --   BILITOT  --   --   --  0.4  --    < > = values in this interval not displayed.     Iron/TIBC/Ferritin/ %Sat    Component Value Date/Time   IRON 109 05/23/2017 1153   TIBC 311 05/23/2017 1153   FERRITIN 670 (H) 05/23/2017 1153   IRONPCTSAT 35 05/23/2017 1153   SPEP negative for M spike.   RADIOGRAPHIC STUDIES: I have personally reviewed the radiological images as listed and agreed with the findings in the report. 01/19/2018 CXR FINDINGS: The lungs are clear and negative for focal airspace consolidation, pulmonary edema or suspicious pulmonary nodule. No pleural effusion or pneumothorax. Cardiac and mediastinal contours are within normal limits. Patient is status post median sternotomy with evidence of prior CABG including LIMA bypass. Trace linear scarring versus atelectasis in the lingula. No acute fracture or lytic or blastic osseous lesions.  The visualized upper abdominal bowel gas pattern is unremarkable.  IMPRESSION: No active cardiopulmonary disease. ASSESSMENT & PLAN:  1. Anemia of chronic renal failure, stage 3 (moderate), unspecified whether stage 3a or 3b CKD   2. Thrombocytopenia ([place])    #Labs reviewed and discussed with patient. CBC showed hemoglobin is 10.2 today, hold off Retacrit. Continue H&H every 4 weeks and proceed with Retacrit 20,000 if hemoglobin is less than 10. I offered patient to come every 6-8 weeks for checking hemoglobin and he prefers to keep the same previous schedule.  #Chronic  thrombocytopenia, Stable platelet counts. CKD, avoid nephrotoxins.  Continue follow-up with nephrology.  All questions were answered. The patient knows to call the clinic with any problems questions or concerns. Orders Placed This Encounter  Procedures  . Hematocrit    Standing Status:   Future    Standing Expiration Date:   11/09/2020  . Hemoglobin    Standing Status:   Future    Standing Expiration Date:   11/09/2020  . Hematocrit    Standing Status:   Future    Standing Expiration Date:   11/09/2020  . Hemoglobin    Standing Status:   Future    Standing Expiration Date:   11/09/2020  . CBC with Differential/Platelet    Standing Status:   Future    Standing Expiration Date:   11/09/2020    Return of visit:  Monthly H&H +/- retacrit. Follow up in clinic in 3 months.     Earlie Server, MD, PhD  11/10/2019

## 2019-11-10 NOTE — Progress Notes (Signed)
Patient denies new problems/concerns today.   °

## 2019-11-11 ENCOUNTER — Other Ambulatory Visit: Payer: Medicare Other

## 2019-11-11 ENCOUNTER — Ambulatory Visit: Payer: Medicare Other

## 2019-11-11 ENCOUNTER — Ambulatory Visit: Payer: Medicare Other | Admitting: Oncology

## 2019-11-12 ENCOUNTER — Other Ambulatory Visit: Payer: Self-pay | Admitting: Primary Care

## 2019-11-12 DIAGNOSIS — K219 Gastro-esophageal reflux disease without esophagitis: Secondary | ICD-10-CM

## 2019-11-21 ENCOUNTER — Other Ambulatory Visit: Payer: Self-pay | Admitting: Primary Care

## 2019-11-21 DIAGNOSIS — I8393 Asymptomatic varicose veins of bilateral lower extremities: Secondary | ICD-10-CM

## 2019-11-22 DIAGNOSIS — N2581 Secondary hyperparathyroidism of renal origin: Secondary | ICD-10-CM | POA: Diagnosis not present

## 2019-11-22 DIAGNOSIS — N1832 Chronic kidney disease, stage 3b: Secondary | ICD-10-CM | POA: Diagnosis not present

## 2019-11-22 DIAGNOSIS — E875 Hyperkalemia: Secondary | ICD-10-CM | POA: Diagnosis not present

## 2019-11-22 DIAGNOSIS — I10 Essential (primary) hypertension: Secondary | ICD-10-CM | POA: Diagnosis not present

## 2019-11-22 DIAGNOSIS — D631 Anemia in chronic kidney disease: Secondary | ICD-10-CM | POA: Diagnosis not present

## 2019-11-22 NOTE — Telephone Encounter (Signed)
I am not familiar with this cream is it something that you refill?

## 2019-11-24 NOTE — Telephone Encounter (Signed)
Left message to return call to our office.  

## 2019-11-24 NOTE — Telephone Encounter (Signed)
Patient is scheduled with [facility] in [place] - they were calling him to try and get him in sooner with their office as requested per Anda Kraft. She wanted him seen within 1-2 weeks of the referral being placed and he was scheduled 01/12/20, they were notified and have been trying to reach the patient to schedule him for a sooner appt. He needs to contact their office. Phone: [phone]   Wed 01/12/20 01:45 PM [facility] BURL VANGA, ROHINI REDDY NEW PATIENT 30 Scheduled

## 2019-11-29 NOTE — Telephone Encounter (Signed)
Left message to return call to our office.  

## 2019-12-01 ENCOUNTER — Other Ambulatory Visit: Payer: Self-pay

## 2019-12-01 ENCOUNTER — Ambulatory Visit (INDEPENDENT_AMBULATORY_CARE_PROVIDER_SITE_OTHER): Payer: Medicare Other

## 2019-12-01 ENCOUNTER — Ambulatory Visit: Payer: Medicare Other

## 2019-12-01 DIAGNOSIS — Z23 Encounter for immunization: Secondary | ICD-10-CM | POA: Diagnosis not present

## 2019-12-03 NOTE — Telephone Encounter (Signed)
Left message to return call to our office.   Ok to send letter to call office?

## 2019-12-08 NOTE — Telephone Encounter (Signed)
Are we allowed to contact his son, Maron Stanzione? [birth year]/03/55

## 2019-12-08 NOTE — Telephone Encounter (Signed)
Ok to send letter have called x 4 with no answer

## 2019-12-10 ENCOUNTER — Other Ambulatory Visit: Payer: Self-pay

## 2019-12-10 ENCOUNTER — Inpatient Hospital Stay: Payer: Medicare Other

## 2019-12-10 ENCOUNTER — Inpatient Hospital Stay: Payer: Medicare Other | Attending: Oncology

## 2019-12-10 VITALS — BP 160/60 | HR 62

## 2019-12-10 DIAGNOSIS — N183 Chronic kidney disease, stage 3 unspecified: Secondary | ICD-10-CM

## 2019-12-10 DIAGNOSIS — I129 Hypertensive chronic kidney disease with stage 1 through stage 4 chronic kidney disease, or unspecified chronic kidney disease: Secondary | ICD-10-CM | POA: Diagnosis not present

## 2019-12-10 DIAGNOSIS — Z79899 Other long term (current) drug therapy: Secondary | ICD-10-CM | POA: Diagnosis not present

## 2019-12-10 DIAGNOSIS — D631 Anemia in chronic kidney disease: Secondary | ICD-10-CM

## 2019-12-10 LAB — HEMOGLOBIN: Hemoglobin: 9.8 g/dL — ABNORMAL LOW (ref 13.0–17.0)

## 2019-12-10 LAB — HEMATOCRIT: HCT: 28.5 % — ABNORMAL LOW (ref 39.0–52.0)

## 2019-12-10 MED ORDER — EPOETIN ALFA-EPBX 10000 UNIT/ML IJ SOLN
20000.0000 [IU] | Freq: Once | INTRAMUSCULAR | Status: AC
  Administered 2019-12-10: 20000 [IU] via SUBCUTANEOUS
  Filled 2019-12-10: qty 2

## 2019-12-16 ENCOUNTER — Ambulatory Visit (INDEPENDENT_AMBULATORY_CARE_PROVIDER_SITE_OTHER): Payer: Medicare Other | Admitting: Gastroenterology

## 2019-12-16 ENCOUNTER — Other Ambulatory Visit: Payer: Self-pay

## 2019-12-16 ENCOUNTER — Encounter: Payer: Self-pay | Admitting: Gastroenterology

## 2019-12-16 VITALS — BP 189/78 | HR 65 | Temp 97.7°F | Ht 70.0 in | Wt 159.1 lb

## 2019-12-16 DIAGNOSIS — R159 Full incontinence of feces: Secondary | ICD-10-CM | POA: Diagnosis not present

## 2019-12-16 DIAGNOSIS — K5900 Constipation, unspecified: Secondary | ICD-10-CM

## 2019-12-16 NOTE — Patient Instructions (Signed)

## 2019-12-16 NOTE — Progress Notes (Signed)
[name], MD [address]  [place]  [place], [place] [address]  Main: [phone]  Fax: [phone]    Gastroenterology Consultation  Referring Provider:     Pleas Koch, NP Primary Care Physician:  Pleas Koch, NP Primary Gastroenterologist:  Dr. [name] Reason for Consultation:     Fecal incontinence        HPI:   Tommy [name] is a 86 y.o. male referred by Dr. Carlis Abbott, Leticia Penna, NP  for consultation & management of fecal incontinence.  Patient reports that approximately 2 months ago he had a leakage of stool, subsequently a week later, he soiled his bed sheets during the night.  He reports that these 2 episodes happened when he was having diarrhea.  He reports bowel movements are variable consistency, anywhere from constipation to soft stool and sometimes diarrhea.  He takes Metamucil once daily which keeps his bowels soft, he goes once a day.  He did not have any episodes since then.  He denies rectal bleeding, abdominal pain, weight loss, bloating. He does have history of stage III CKD, hemoglobin is 9.8, followed by hematology, receives erythropoietin.  NSAIDs: None  Antiplts/Anticoagulants/Anti thrombotics: None  GI Procedures:  Colonoscopy 03/05/2007, reportedly normal  Past Medical History:  Diagnosis Date  . Anemia   . Carotid artery occlusion    a. 2004 s/p L carotid endarterectomy; b. 02/2019 U/S: 1-39% bilat ICA stenosis. <50% bilat CCA stenosis.  . CKD (chronic kidney disease) stage 3, GFR 30-59 ml/min (HCC)   . Coronary artery disease    a. 1994 s/p CABG x 3, [place], [place] (LIMA->LAD, VG->dRCA, VG->unknown vessel); b. 02/2016 Cath: LM 99d, LAD 100ost, LCX 100ost, RCA 80ost, diffuse 85-1m. LIMA->LAD ok, VG->RCA ok. VG->unknown vessel 100.  . Diabetes mellitus without complication ([place])   . Dyslipidemia   . GERD (gastroesophageal reflux disease)   . Hemorrhoids   . 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 History of  echocardiogram    a. 02/2017 Echo: EF 55-65%, no rwma. Nl DD. Ao sclerosis w/o stenosis. Mod TR. Mild MR.  Marland Kitchen Hypertension   . Pulmonary embolism ([place])   . Thyroid disease     Past Surgical History:  Procedure Laterality Date  . CAROTID ENDARTERECTOMY Left 2004  . CORONARY ARTERY BYPASS GRAFT  1994   [name]  . TONSILLECTOMY AND ADENOIDECTOMY      Current Outpatient Medications:  .  aspirin 81 MG tablet, Take 81 mg by mouth every evening. , Disp: , Rfl:  .  atorvastatin (LIPITOR) 40 MG tablet, TAKE 1 TABLET BY MOUTH  DAILY, Disp: 90 tablet, Rfl: 3 .  Bacillus Coagulans-Inulin (ALIGN PREBIOTIC-PROBIOTIC PO), Take by mouth., Disp: , Rfl:  .  betamethasone valerate (VALISONE) 0.1 % cream, APPLY TO AFFECTED AREA TWICE A DAY, Disp: 30 g, Rfl: 0 .  Blood Glucose Monitoring Suppl (ONETOUCH VERIO) w/Device KIT, 1 Device by Does not apply route daily. Use as instructed to test blood sugar daily, Disp: 1 kit, Rfl: 0 .  CALCIUM PO, Take 600 mg by mouth every morning. , Disp: , Rfl:  .  carvedilol (COREG) 3.125 MG tablet, Take 1 tablet (3.125 mg total) by mouth 2 (two) times daily., Disp: 180 tablet, Rfl: 2 .  cholecalciferol (VITAMIN D3) 25 MCG (1000 UT) tablet, Take 1,000 Units by mouth daily., Disp: , Rfl:  .  epoetin alfa (EPOGEN,PROCRIT) 27253 UNIT/ML injection, 10,000 Units. Every 2 to 4 weeks, Disp: , Rfl:  .  furosemide (LASIX) 40 MG tablet, TAKE 1 TABLET BY MOUTH  DAILY, Disp: 90 tablet, Rfl: 2 .  glimepiride (AMARYL) 4 MG tablet, TAKE 1 TABLET BY MOUTH  DAILY WITH BREAKFAST, Disp: 120 tablet, Rfl: 1 .  Hydrocortisone (PROCTOSOL HC RE), Place rectally as needed., Disp: , Rfl:  .  Insulin Glargine (LANTUS) 100 UNIT/ML Solostar Pen, Inject 6 Units into the skin daily., Disp: , Rfl:  .  Insulin Pen Needle 29G X 12.7MM MISC, BD Ultrafine Pen Needle Use as instructed to inject insulin daily, Disp: 300 each, Rfl: 1 .  IRON PO, Take by mouth. 2 QAM 1 QPM, Disp: , Rfl:  .  Lancets  (ONETOUCH DELICA PLUS [id number]) MISC, USE AS INSTRUCT TO TEST BLOOD SUGAR ONCE DAILY, Disp: 100 each, Rfl: 5 .  levothyroxine (SYNTHROID) 175 MCG tablet, TAKE 1 TABLET BY MOUTH IN  THE MORNING ON AN EMPTY  STOMACH WITH A FULL GLASS  OF WATER, Disp: 120 tablet, Rfl: 1 .  Multiple Vitamin (MULTIVITAMIN) capsule, Take 1 capsule by mouth daily., Disp: , Rfl:  .  Nutritional Supplements (JUICE PLUS FIBRE PO), Take by mouth., Disp: , Rfl:  .  Omega-3 Fatty Acids (FISH OIL PO), Take 1,200 mg by mouth 4 (four) times daily., Disp: , Rfl:  .  ONETOUCH VERIO test strip, USE AS INSTRUCTED TO TEST BLOOD SUGAR ONCE DAILY, Disp: 100 strip, Rfl: 5 .  pantoprazole (PROTONIX) 20 MG tablet, TAKE 1 TABLET BY MOUTH  DAILY, Disp: 120 tablet, Rfl: 2 .  ranolazine (RANEXA) 1000 MG SR tablet, Take 1 tablet (1,000 mg total) by mouth 2 (two) times daily., Disp: 180 tablet, Rfl: 3 .  timolol (TIMOPTIC) 0.5 % ophthalmic solution, APPLY 1 DROP(S) IN BOTH EYES ONCE A DAY, Disp: , Rfl: 11 .  nitroGLYCERIN (NITROSTAT) 0.4 MG SL tablet, Place 1 tablet (0.4 mg total) under the tongue every 5 (five) minutes as needed for chest pain., Disp: 25 tablet, Rfl: 3   Family History  Problem Relation Age of Onset  . Heart attack Mother   . Heart disease Mother   . Stroke Mother   . Diabetes Father   . Coronary artery disease Sister      Social History   Tobacco Use  . Smoking status: Former Smoker    Packs/day: 1.50    Years: 25.00    Pack years: 37.50    Types: Cigarettes    Quit date: 1978    Years since quitting: 43.8  . Smokeless tobacco: Never Used  Vaping Use  . Vaping Use: Never used  Substance Use Topics  . Alcohol use: Yes    Alcohol/week: 1.0 standard drink    Types: 1 Glasses of wine per week    Comment: socially   . Drug use: No    Allergies as of 12/16/2019 - Review Complete 12/16/2019  Allergen Reaction Noted  . Heparin Other (See Comments) 12/09/2016  . Patiromer Other (See Comments) 03/17/2019  .  Sodium zirconium cyclosilicate Other (See Comments) 03/17/2019    Review of Systems:    All systems reviewed and negative except where noted in HPI.   Physical Exam:  BP (!) 189/78 (BP Location: Left Arm, Patient Position: Sitting, Cuff Size: Normal)   Pulse 65   Temp 97.7 F (36.5 C) (Oral)   Ht $R'5\' 10"'wv$  (1.778 m)   Wt 159 lb 2 oz (72.2 kg)   BMI 22.83 kg/m  No LMP for male patient.  General:   Alert,  Well-developed, well-nourished,  pleasant and cooperative in NAD Head:  Normocephalic and atraumatic. Eyes:  Sclera clear, no icterus.   Conjunctiva pink. Ears:  Normal auditory acuity. Nose:  No deformity, discharge, or lesions. Mouth:  No deformity or lesions,oropharynx pink & moist. Neck:  Supple; no masses or thyromegaly. Lungs:  Respirations even and unlabored.  Clear throughout to auscultation.   No wheezes, crackles, or rhonchi. No acute distress. Heart:  Regular rate and rhythm; no murmurs, clicks, rubs, or gallops. Abdomen:  Normal bowel sounds. Soft, non-tender and non-distended without masses, hepatosplenomegaly or hernias noted.  No guarding or rebound tenderness.   Rectal: Dried brown stool outside the perianal area.  Decreased anal sphincter tone, formed stool in the rectal vault Msk:  Symmetrical without gross deformities. Good, equal movement & strength bilaterally. Pulses:  Normal pulses noted. Extremities:  No clubbing or edema.  No cyanosis. Neurologic:  Alert and oriented x3;  grossly normal neurologically. Skin:  Intact without significant lesions or rashes. No jaundice. Psych:  Alert and cooperative. Normal mood and affect.  Imaging Studies: None  Assessment and Plan:   Tommy [name] is a 86 y.o. male with history of CKD stage III, diabetes is seen in consultation for fecal incontinence  Fecal incontinence likely in setting of change in stool consistency If this recurs, recommend colonoscopy and patient is agreeable Encouraged him to continue fiber to  keep 1 formed bowel movement daily   Follow up in 2 to 3 months   [name], MD

## 2019-12-28 DIAGNOSIS — Z23 Encounter for immunization: Secondary | ICD-10-CM | POA: Diagnosis not present

## 2020-01-03 ENCOUNTER — Other Ambulatory Visit: Payer: Self-pay | Admitting: Primary Care

## 2020-01-03 DIAGNOSIS — E039 Hypothyroidism, unspecified: Secondary | ICD-10-CM

## 2020-01-10 ENCOUNTER — Other Ambulatory Visit: Payer: Self-pay

## 2020-01-10 ENCOUNTER — Inpatient Hospital Stay: Payer: Medicare Other

## 2020-01-10 ENCOUNTER — Inpatient Hospital Stay: Payer: Medicare Other | Attending: Oncology

## 2020-01-10 DIAGNOSIS — N183 Chronic kidney disease, stage 3 unspecified: Secondary | ICD-10-CM | POA: Diagnosis not present

## 2020-01-10 DIAGNOSIS — D631 Anemia in chronic kidney disease: Secondary | ICD-10-CM | POA: Diagnosis not present

## 2020-01-10 DIAGNOSIS — I129 Hypertensive chronic kidney disease with stage 1 through stage 4 chronic kidney disease, or unspecified chronic kidney disease: Secondary | ICD-10-CM | POA: Insufficient documentation

## 2020-01-10 LAB — HEMATOCRIT: HCT: 29 % — ABNORMAL LOW (ref 39.0–52.0)

## 2020-01-10 LAB — HEMOGLOBIN: Hemoglobin: 10 g/dL — ABNORMAL LOW (ref 13.0–17.0)

## 2020-01-10 NOTE — Telephone Encounter (Signed)
Alice, please schedule. Thanks!

## 2020-01-11 NOTE — Telephone Encounter (Signed)
Pt called back, set up the appointment for 01/13/20 at 11:20

## 2020-01-11 NOTE — Telephone Encounter (Signed)
Called patient to schedule OV. LVM to call back.  °

## 2020-01-12 ENCOUNTER — Ambulatory Visit: Payer: Medicare Other | Admitting: Gastroenterology

## 2020-01-13 ENCOUNTER — Other Ambulatory Visit: Payer: Self-pay

## 2020-01-13 ENCOUNTER — Encounter: Payer: Self-pay | Admitting: Primary Care

## 2020-01-13 ENCOUNTER — Ambulatory Visit (INDEPENDENT_AMBULATORY_CARE_PROVIDER_SITE_OTHER): Payer: Medicare Other | Admitting: Primary Care

## 2020-01-13 DIAGNOSIS — R351 Nocturia: Secondary | ICD-10-CM | POA: Diagnosis not present

## 2020-01-13 DIAGNOSIS — I25118 Atherosclerotic heart disease of native coronary artery with other forms of angina pectoris: Secondary | ICD-10-CM | POA: Diagnosis not present

## 2020-01-13 DIAGNOSIS — R159 Full incontinence of feces: Secondary | ICD-10-CM

## 2020-01-13 NOTE — Assessment & Plan Note (Signed)
Chronic for years, gradually progressing.  Suspect symptoms are secondary to prostate enlargement.  He is not quite convinced.  Discussed a few options for treatment, he would like to start with Unisom at night for sleep disturbance.  He will try this and if no improvement will update within a week or 2.  If no improvement then consider tamsulosin.  I offered a prostate exam today and he currently declined He was also unable to provide [place] with a urine specimen.

## 2020-01-13 NOTE — Patient Instructions (Signed)
Remember to stop caffeine before 12 pm. Limit liquids before bedtime.  Try Unisom for sleep at night as discussed.  Please update me in 1-2 weeks as discussed.  It was a pleasure to see you today!

## 2020-01-13 NOTE — Progress Notes (Signed)
Subjective:    Patient ID: Tommy [name], male    DOB: 15-Mar-1926, 86 y.o.   MRN: [id number]  HPI  This visit occurred during the SARS-CoV-2 public health emergency.  Safety protocols were in place, including screening questions prior to the visit, additional usage of staff PPE, and extensive cleaning of exam room while observing appropriate contact time as indicated for disinfecting solutions.   Tommy [name] is a 86 year old male with a history of CAD, carotid artery disease, hypertension, type 2 diabetes, CKD Stage 3, Anemia, hypothyroidism, fecal incontinence who presents today to discuss nocturia.   He will wake 2-3 times during the night with the urge to urinate, but the sensation/urge will dissipate once he's out of bed. Once he's up he will read or play a game for about one hour, then will urinate, then will return to sleep. He will wake up again within 1-2 hours with the same symptoms. This began about three years ago with gradual progression over time. This has altered his sleep making it harder to get up in the morning.   During the day he denies these symptoms. He also denies difficulty initiating his stream during the day or night, hematuria, foul smell to urine, frequent urination, history of prostate enlargement, excessive caffeine, liquids before bed, anxiety, snoring.   He's tried taking Melatonin but this caused him to sleep more during the day without improvement in sleep during the night. He's checking his blood sugars daily and is getting readings in the low 100's.   BP Readings from Last 3 Encounters:  01/13/20 124/62  01/10/20 (!) 124/56  12/16/19 (!) 189/78     Review of Systems  Constitutional: Negative for fever.  Gastrointestinal: Negative for abdominal pain.  Genitourinary: Negative for dysuria, frequency, hematuria, penile pain and urgency.       Nocturia.   Psychiatric/Behavioral: Positive for sleep disturbance.       Past Medical History:  Diagnosis  Date  . Anemia   . Carotid artery occlusion    a. 2004 s/p L carotid endarterectomy; b. 02/2019 U/S: 1-39% bilat ICA stenosis. <50% bilat CCA stenosis.  . CKD (chronic kidney disease) stage 3, GFR 30-59 ml/min (HCC)   . Coronary artery disease    a. 1994 s/p CABG x 3, [place], [place] (LIMA->LAD, VG->dRCA, VG->unknown vessel); b. 02/2016 Cath: LM 99d, LAD 100ost, LCX 100ost, RCA 80ost, diffuse 85-63m. LIMA->LAD ok, VG->RCA ok. VG->unknown vessel 100.  . Diabetes mellitus without complication ([place])   . Dyslipidemia   . GERD (gastroesophageal reflux disease)   . Hemorrhoids   . 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 History of echocardiogram    a. 02/2017 Echo: EF 55-65%, no rwma. Nl DD. Ao sclerosis w/o stenosis. Mod TR. Mild MR.  Marland Kitchen Hypertension   . Pulmonary embolism ([place])   . Thyroid disease      Social History   Socioeconomic History  . Marital status: Widowed    Spouse name: Not on file  . Number of children: 4  . Years of education: 16  . Highest education level: Bachelor's degree (e.g., BA, AB, BS)  Occupational History  . Occupation: retired  Tobacco Use  . Smoking status: Former Smoker    Packs/day: 1.50    Years: 25.00    Pack years: 37.50    Types: Cigarettes    Quit date: 1978    Years since quitting: 43.8  . Smokeless tobacco: Never Used  Vaping Use  . Vaping Use: Never  used  Substance and Sexual Activity  . Alcohol use: Yes    Alcohol/week: 1.0 standard drink    Types: 1 Glasses of wine per week    Comment: socially   . Drug use: No  . Sexual activity: Not Currently  Other Topics Concern  . Not on file  Social History Narrative   Widower.   4 children, 7 grandchildren, 1 great grandchild.   Retired. Once worked as an account.    Enjoys exercising, spending time with family.    Social Determinants of Health   Financial Resource Strain:   . Difficulty of Paying Living Expenses: Not on file  Food Insecurity:   . Worried About Sales executive in the Last Year: Not on file  . Ran Out of Food in the Last Year: Not on file  Transportation Needs:   . Lack of Transportation (Medical): Not on file  . Lack of Transportation (Non-Medical): Not on file  Physical Activity:   . Days of Exercise per Week: Not on file  . Minutes of Exercise per Session: Not on file  Stress:   . Feeling of Stress : Not on file  Social Connections:   . Frequency of Communication with Friends and Family: Not on file  . Frequency of Social Gatherings with Friends and Family: Not on file  . Attends Religious Services: Not on file  . Active Member of Clubs or Organizations: Not on file  . Attends Archivist Meetings: Not on file  . Marital Status: Not on file  Intimate Partner Violence:   . Fear of Current or Ex-Partner: Not on file  . Emotionally Abused: Not on file  . Physically Abused: Not on file  . Sexually Abused: Not on file    Past Surgical History:  Procedure Laterality Date  . CAROTID ENDARTERECTOMY Left 2004  . CORONARY ARTERY BYPASS GRAFT  1994   [name]  . TONSILLECTOMY AND ADENOIDECTOMY      Family History  Problem Relation Age of Onset  . Heart attack Mother   . Heart disease Mother   . Stroke Mother   . Diabetes Father   . Coronary artery disease Sister     Allergies  Allergen Reactions  . Heparin Other (See Comments)  . Patiromer Other (See Comments)  . Sodium Zirconium Cyclosilicate Other (See Comments)    Current Outpatient Medications on File Prior to Visit  Medication Sig Dispense Refill  . aspirin 81 MG tablet Take 81 mg by mouth every evening.     Marland Kitchen atorvastatin (LIPITOR) 40 MG tablet TAKE 1 TABLET BY MOUTH  DAILY 90 tablet 3  . Bacillus Coagulans-Inulin (ALIGN PREBIOTIC-PROBIOTIC PO) Take by mouth.    . betamethasone valerate (VALISONE) 0.1 % cream APPLY TO AFFECTED AREA TWICE A DAY 30 g 0  . Blood Glucose Monitoring Suppl (ONETOUCH VERIO) w/Device KIT 1 Device by Does not apply route  daily. Use as instructed to test blood sugar daily 1 kit 0  . CALCIUM PO Take 600 mg by mouth every morning.     . carvedilol (COREG) 3.125 MG tablet Take 1 tablet (3.125 mg total) by mouth 2 (two) times daily. 180 tablet 2  . cholecalciferol (VITAMIN D3) 25 MCG (1000 UT) tablet Take 1,000 Units by mouth daily.    Marland Kitchen epoetin alfa (EPOGEN,PROCRIT) 71062 UNIT/ML injection 10,000 Units. Every 2 to 4 weeks    . furosemide (LASIX) 40 MG tablet TAKE 1 TABLET BY MOUTH  DAILY 90 tablet  2  . glimepiride (AMARYL) 4 MG tablet TAKE 1 TABLET BY MOUTH  DAILY WITH BREAKFAST 120 tablet 2  . Hydrocortisone (PROCTOSOL HC RE) Place rectally as needed.    . Insulin Glargine (LANTUS) 100 UNIT/ML Solostar Pen Inject 6 Units into the skin daily.    . Insulin Pen Needle 29G X 12.7MM MISC BD Ultrafine Pen Needle Use as instructed to inject insulin daily 300 each 1  . IRON PO Take by mouth. 2 QAM 1 QPM    . Lancets (ONETOUCH DELICA PLUS [id number]) MISC USE AS INSTRUCT TO TEST BLOOD SUGAR ONCE DAILY 100 each 5  . levothyroxine (SYNTHROID) 175 MCG tablet TAKE 1 TABLET BY MOUTH IN  THE MORNING ON AN EMPTY  STOMACH WITH A FULL GLASS  OF WATER 120 tablet 2  . Multiple Vitamin (MULTIVITAMIN) capsule Take 1 capsule by mouth daily.    . Nutritional Supplements (JUICE PLUS FIBRE PO) Take by mouth.    . Omega-3 Fatty Acids (FISH OIL PO) Take 1,200 mg by mouth 4 (four) times daily.    Glory Rosebush VERIO test strip USE AS INSTRUCTED TO TEST BLOOD SUGAR ONCE DAILY 100 strip 5  . pantoprazole (PROTONIX) 20 MG tablet TAKE 1 TABLET BY MOUTH  DAILY 120 tablet 2  . ranolazine (RANEXA) 1000 MG SR tablet Take 1 tablet (1,000 mg total) by mouth 2 (two) times daily. 180 tablet 3  . timolol (TIMOPTIC) 0.5 % ophthalmic solution APPLY 1 DROP(S) IN BOTH EYES ONCE A DAY  11  . nitroGLYCERIN (NITROSTAT) 0.4 MG SL tablet Place 1 tablet (0.4 mg total) under the tongue every 5 (five) minutes as needed for chest pain. 25 tablet 3   No current  facility-administered medications on file prior to visit.    BP 124/62   Pulse 66   Temp (!) 96.8 F (36 C) (Temporal)   Ht $R'5\' 10"'mc$  (1.778 m)   Wt 155 lb (70.3 kg)   SpO2 98%   BMI 22.24 kg/m    Objective:   Physical Exam Cardiovascular:     Rate and Rhythm: Normal rate and regular rhythm.  Pulmonary:     Effort: Pulmonary effort is normal.     Breath sounds: Normal breath sounds.  Skin:    General: Skin is warm and dry.  Neurological:     Mental Status: He is alert.            Assessment & Plan:

## 2020-01-13 NOTE — Assessment & Plan Note (Signed)
Resolved after taking Metamucil consistently. Continue to monitor.

## 2020-01-19 ENCOUNTER — Ambulatory Visit (INDEPENDENT_AMBULATORY_CARE_PROVIDER_SITE_OTHER): Payer: Medicare Other | Admitting: Internal Medicine

## 2020-01-19 ENCOUNTER — Encounter: Payer: Self-pay | Admitting: Internal Medicine

## 2020-01-19 ENCOUNTER — Other Ambulatory Visit: Payer: Self-pay

## 2020-01-19 VITALS — BP 142/60 | HR 66 | Ht 70.0 in | Wt 157.0 lb

## 2020-01-19 DIAGNOSIS — I25118 Atherosclerotic heart disease of native coronary artery with other forms of angina pectoris: Secondary | ICD-10-CM | POA: Diagnosis not present

## 2020-01-19 DIAGNOSIS — I453 Trifascicular block: Secondary | ICD-10-CM

## 2020-01-19 DIAGNOSIS — I10 Essential (primary) hypertension: Secondary | ICD-10-CM

## 2020-01-19 DIAGNOSIS — E785 Hyperlipidemia, unspecified: Secondary | ICD-10-CM

## 2020-01-19 NOTE — Telephone Encounter (Signed)
Alice, will you get him scheduled?

## 2020-01-19 NOTE — Progress Notes (Signed)
Follow-up Outpatient Visit Date: 01/19/2020  Primary Care Provider: Pleas Koch, NP [place] [address]  Chief Complaint: Dyspnea on exertion  HPI:  Tommy [name] is a 86 y.o. male with history of coronary artery disease status post remote CABG with stable angina, PAD, carotid artery stenosis status post left CEA, hypertension, hyperlipidemia, type 2 diabetes mellitus,CKD, and chronic anemia, who presents for follow-up of CAD and PAD.  I last saw Tommy [name] in August, at which time he was doing well with only a single episode of nocturnal chest pain that resolved with a single dose of sublingual nitroglycerin.  Given his advanced age, CKD, and stable symptoms, we agreed to defer ischemia evaluation.  Carvedilol was decreased to 3.125 mg twice daily in the setting of trifascicular block.  Today, Tommy [name] reports that he feels relatively well.  He initially denies any symptoms but on further questioning expresses exertional dyspnea on a regular basis with mild activity.  Despite this, he says that he exercises regularly and feels like his exercise capacity is unchanged from prior visits.  He has not had any further episodes of chest pain.  He also denies palpitations and edema.  He had a brief episode of lightheadedness today while walking, which resolved after resting for a minute or 2.  This has been an isolated event.  Home blood pressures have been in the 120-140/50-70.  He has not felt any difference since carvedilol was decreased at our last visit.  No CP, SOB, palpitations, or edema.  Got LH today, needed to rest.  Isolated event.  Home BP 120's-140's/50-70.  DOE hard to say if any worse.  --------------------------------------------------------------------------------------------------  Cardiovascular History & Procedures: Cardiovascular Problems:  Coronary artery disease status post CABG (1994)  Carotid artery stenosis status post left carotid endarterectomy  (2004)  Abdominal aortic aneurysm  Trifascicular block  Risk Factors:  Known coronary artery disease, hypertension, hyperlipidemia, diabetes mellitus, male gender, and age  Cath/PCI:  LHC (02/12/16): Severe native disease with chronic total occlusions of the ostial LAD and LCx as well as 99% stenosis of distal LMCA. 80% ostial RCA stenosis as well as diffuse mid RCA with tandem lesions of 85-99%. Patent LIMA to LAD and SVG to distal RCA. Additional SVG to unknown vessel is chronically occluded.  CV Surgery:  CABG (1994, [place], [place]): LIMA to LAD, SVG to distal RCA, and SVG to other unknown vessel.  Left carotid endarterectomy (2004)  EP Procedures and Devices:  14-day event monitor (08/03/2019): Predominantly sinus rhythm with rare PAC's and PVC's, as well as two brief atrial runs.  No evidence of high-grade AV block.  Non-Invasive Evaluation(s):  Aortoiliac duplex (06/03/2019): Mild abdominal aortic dilation, measuring up to 3.4 cm.  Carotid Doppler (02/08/2019): 1-39% stenosis in both internal carotid arteries. Left carotid endarterectomy site patent without significant narrowing. Antegrade vertebral artery flow bilaterally. Right subclavian artery flow suggested stenosis.  TTE (02/06/17): Normal LV size. LVEF 55-60% with normal wall motion. Normal diastolic function. Aortic sclerosis without stenosis. Mild MR. Mild left atrial enlargement. Moderate TR. Upper normal to mildly elevated pulmonary artery pressure.  Carotid Doppler (02/06/17): Mild narrowing of bilateral internal carotid arteries (less than 40%. Less than 50% stenosis involving the right common carotid artery. Antegrade vertebral artery flow bilaterally. Normal flow in the subclavian arteries bilaterally.  Carotid artery Doppler (09/14/15): Moderate plaque in the right ICA with less than 50% stenosis.  Pharmacologic MPI (12/16/13): Mild inferior ischemia with LVEF of 58%.  TTE (  12/02/13): Mild LVH  with LVEF of 65% and grade 1 diastolic dysfunction. Aortic sclerosis, mild TR, and moderate pulmonary hypertension. Question PFO.  Aortoiliac ultrasound (08/09/13): No evidence of AAA. Moderately elevated right common iliac artery velocity with 50-75% stenosis.  Recent CV Pertinent Labs: Lab Results  Component Value Date   CHOL 129 04/20/2019   HDL 44.50 04/20/2019   LDLCALC 50 04/20/2019   TRIG 176.0 (H) 04/20/2019   CHOLHDL 3 04/20/2019   K 5.4 (H) 06/08/2019   BUN 41 (H) 06/08/2019   CREATININE 2.02 (H) 06/08/2019    Past medical and surgical history were reviewed and updated in EPIC.  Current Meds  Medication Sig  . aspirin 81 MG tablet Take 81 mg by mouth every evening.   Marland Kitchen atorvastatin (LIPITOR) 40 MG tablet TAKE 1 TABLET BY MOUTH  DAILY  . Bacillus Coagulans-Inulin (ALIGN PREBIOTIC-PROBIOTIC PO) Take by mouth.  . betamethasone valerate (VALISONE) 0.1 % cream APPLY TO AFFECTED AREA TWICE A DAY  . Blood Glucose Monitoring Suppl (ONETOUCH VERIO) w/Device KIT 1 Device by Does not apply route daily. Use as instructed to test blood sugar daily  . CALCIUM PO Take 600 mg by mouth every morning.   . carvedilol (COREG) 3.125 MG tablet Take 1 tablet (3.125 mg total) by mouth 2 (two) times daily.  . cholecalciferol (VITAMIN D3) 25 MCG (1000 UT) tablet Take 1,000 Units by mouth daily.  Marland Kitchen epoetin alfa (EPOGEN,PROCRIT) 76226 UNIT/ML injection 10,000 Units. Every 2 to 4 weeks  . furosemide (LASIX) 40 MG tablet TAKE 1 TABLET BY MOUTH  DAILY  . glimepiride (AMARYL) 4 MG tablet TAKE 1 TABLET BY MOUTH  DAILY WITH BREAKFAST  . Hydrocortisone (PROCTOSOL HC RE) Place rectally as needed.  . Insulin Glargine (LANTUS) 100 UNIT/ML Solostar Pen Inject 6 Units into the skin daily.  . Insulin Pen Needle 29G X 12.7MM MISC BD Ultrafine Pen Needle Use as instructed to inject insulin daily  . IRON PO Take by mouth. 2 QAM 1 QPM  . Lancets (ONETOUCH DELICA PLUS [id number]) MISC USE AS INSTRUCT TO TEST BLOOD  SUGAR ONCE DAILY  . levothyroxine (SYNTHROID) 175 MCG tablet TAKE 1 TABLET BY MOUTH IN  THE MORNING ON AN EMPTY  STOMACH WITH A FULL GLASS  OF WATER  . Multiple Vitamin (MULTIVITAMIN) capsule Take 1 capsule by mouth daily.  . Nutritional Supplements (JUICE PLUS FIBRE PO) Take by mouth.  . Omega-3 Fatty Acids (FISH OIL PO) Take 1,200 mg by mouth 4 (four) times daily.  Glory Rosebush VERIO test strip USE AS INSTRUCTED TO TEST BLOOD SUGAR ONCE DAILY  . pantoprazole (PROTONIX) 20 MG tablet TAKE 1 TABLET BY MOUTH  DAILY  . ranolazine (RANEXA) 1000 MG SR tablet Take 1 tablet (1,000 mg total) by mouth 2 (two) times daily.  . timolol (TIMOPTIC) 0.5 % ophthalmic solution APPLY 1 DROP(S) IN BOTH EYES ONCE A DAY    Allergies: Heparin, Patiromer, and Sodium zirconium cyclosilicate  Social History   Tobacco Use  . Smoking status: Former Smoker    Packs/day: 1.50    Years: 25.00    Pack years: 37.50    Types: Cigarettes    Quit date: 1978    Years since quitting: 43.9  . Smokeless tobacco: Never Used  Vaping Use  . Vaping Use: Never used  Substance Use Topics  . Alcohol use: Yes    Alcohol/week: 1.0 standard drink    Types: 1 Glasses of wine per week    Comment: socially   .  Drug use: No    Family History  Problem Relation Age of Onset  . Heart attack Mother   . Heart disease Mother   . Stroke Mother   . Diabetes Father   . Coronary artery disease Sister     Review of Systems: A 12-system review of systems was performed and was negative except as noted in the HPI.  --------------------------------------------------------------------------------------------------  Physical Exam: BP (!) 142/60 (BP Location: Left Arm, Patient Position: Sitting, Cuff Size: Normal)   Pulse 66   Ht _0  (1.778 m)   Wt 157 lb (71.2 kg)   SpO2 98%   BMI 22.53 kg/m   General: NAD. Neck: No JVD or HJR. Lungs: Clear to auscultation bilaterally that wheezes or crackles. Heart: Regular rate and rhythm  without murmurs, rubs, or gallops. Abdomen: Soft, nontender, nondistended. Extremities: Trace pretibial edema.  EKG: Normal sinus rhythm with trifascicular block (first-degree AV block, RBBB, and LAFB), LVH, and poor R wave progression.  PR interval has increased slightly since 06/23/2019.  Otherwise, there has been no significant interval change.  Lab Results  Component Value Date   WBC 6.1 11/10/2019   HGB 10.0 (L) 01/10/2020   HCT 29.0 (L) 01/10/2020   MCV 101.0 (H) 11/10/2019   PLT 148 (L) 11/10/2019    Lab Results  Component Value Date   NA 135 06/08/2019   K 5.4 (H) 06/08/2019   CL 100 06/08/2019   CO2 22 06/08/2019   BUN 41 (H) 06/08/2019   CREATININE 2.02 (H) 06/08/2019   GLUCOSE 95 06/08/2019   ALT 22 04/20/2019    Lab Results  Component Value Date   CHOL 129 04/20/2019   HDL 44.50 04/20/2019   LDLCALC 50 04/20/2019   TRIG 176.0 (H) 04/20/2019   CHOLHDL 3 04/20/2019    --------------------------------------------------------------------------------------------------  ASSESSMENT AND PLAN: Coronary artery disease with stable angina: Ms. Rochford denies any recurrent episodes of chest pain but makes note of chronic exertional dyspnea with mild activity.  He is not sure if this has worsened much from prior visits.  He continues to exercise regularly.  We discussed ischemia evaluation but given his comorbidities including CKD and advanced age, risk of invasive procedures such as cardiac catheterization/PCI would be significant.  As such, we have agreed to defer additional testing for now.  However, if he has worsening chest pain or dyspnea, we will need to reconsider this.  I would also be somewhat reluctant to perform a myocardial perfusion stress test with regadenoson in the setting of trifascicular block, given that complete heart block could be precipitated.  Trifascicular block: PR interval was minimally longer compared with April.  I have recommended discontinuation  of carvedilol altogether.  I am not convinced that Mr. Fugitt is fatigue and isolated episode of lightheadedness are due to his conduction disease, as event monitor earlier this year did not show any evidence of second or third-degree AV block.  Nonetheless, Mr. Kutzer remains at high risk for this.  We will continue to monitor his EKGs closely and consider repeating an event monitor or referring him to electrophysiology if his symptoms worsen.  Hypertension: Blood pressure mildly elevated today but typically better at home.  I have asked Mr. Mcnutt to monitor his blood pressure at home in the setting of discontinuing carvedilol and to let us know if it increases.  Hyperlipidemia: LDL at goal.  Continue atorvastatin 40 mg daily.  Follow-up: Return to clinic in 6 weeks.  Nelva Bush, MD 01/19/2020 3:50 PM

## 2020-01-19 NOTE — Patient Instructions (Signed)
Medication Instructions:  Your physician has recommended you make the following change in your medication:  STOP Carvedilol.  Please monitor your Blood pressure and heart rate over the next 2 weeks. Send [place] a MyChart message with update of Blood pressures and how you are feeling and if you energy has improved.   *If you need a refill on your cardiac medications before your next appointment, please call your pharmacy*  Follow-Up: At [facility], you and your health needs are our priority.  As part of our continuing mission to provide you with exceptional heart care, we have created designated Provider Care Teams.  These Care Teams include your primary Cardiologist (physician) and Advanced Practice Providers (APPs -  Physician Assistants and Nurse Practitioners) who all work together to provide you with the care you need, when you need it.  We recommend signing up for the patient portal called "MyChart".  Sign up information is provided on this After Visit Summary.  MyChart is used to connect with patients for Virtual Visits (Telemedicine).  Patients are able to view lab/test results, encounter notes, upcoming appointments, etc.  Non-urgent messages can be sent to your provider as well.   To learn more about what you can do with MyChart, go to [email or web address].    Your next appointment:   6 week(s)  The format for your next appointment:   In Person  Provider:   You may see Nelva Bush, MD or one of the following Advanced Practice Providers on your designated Care Team:    Murray Hodgkins, NP  Christell Faith, PA-C  Marrianne Mood, PA-C  Cadence Belmont, [place]  Laurann Montana, NP

## 2020-01-20 ENCOUNTER — Encounter: Payer: Self-pay | Admitting: Internal Medicine

## 2020-01-24 ENCOUNTER — Other Ambulatory Visit: Payer: Self-pay

## 2020-01-24 ENCOUNTER — Ambulatory Visit (INDEPENDENT_AMBULATORY_CARE_PROVIDER_SITE_OTHER): Payer: Medicare Other | Admitting: Primary Care

## 2020-01-24 DIAGNOSIS — H6123 Impacted cerumen, bilateral: Secondary | ICD-10-CM | POA: Diagnosis not present

## 2020-01-24 DIAGNOSIS — I25118 Atherosclerotic heart disease of native coronary artery with other forms of angina pectoris: Secondary | ICD-10-CM | POA: Diagnosis not present

## 2020-01-24 DIAGNOSIS — H6120 Impacted cerumen, unspecified ear: Secondary | ICD-10-CM | POA: Insufficient documentation

## 2020-01-24 NOTE — Assessment & Plan Note (Signed)
Noted bilaterally on exam. No complaints.  He consented to irrigation. Irrigation completed bilaterally. Patient tolerated well. No complications.

## 2020-01-24 NOTE — Progress Notes (Signed)
Subjective:    Patient ID: Tommy [name], male    DOB: 16-Mar-1926, 86 y.o.   MRN: [id number]  HPI  This visit occurred during the SARS-CoV-2 public health emergency.  Safety protocols were in place, including screening questions prior to the visit, additional usage of staff PPE, and extensive cleaning of exam room while observing appropriate contact time as indicated for disinfecting solutions.   Tommy [name] is a 86 year old male with a history of CAD, carotid artery stenosis, type 2 diabetes, hypothyroidism, PAD, hyperlipidemia, CKD, anemia who presents today for cerumen removal.  He presented to his audiologist recently for a replacement hearing aid, but was unable to undergoing hearing testing due to bilateral cerumen impaction. He was told to have his ears cleaned out first, then return for testing.   He applies mineral oil on a q-tip daily for wax removal, typically gets a larger amount of cerumen out of his right canal vs left canal. He denies otalgia, dizziness.   Review of Systems  HENT: Negative for ear pain.        Cerumen impaction  Neurological: Negative for dizziness.       Past Medical History:  Diagnosis Date  . Anemia   . Carotid artery occlusion    a. 2004 s/p L carotid endarterectomy; b. 02/2019 U/S: 1-39% bilat ICA stenosis. <50% bilat CCA stenosis.  . CKD (chronic kidney disease) stage 3, GFR 30-59 ml/min (HCC)   . Coronary artery disease    a. 1994 s/p CABG x 3, [place], [place] (LIMA->LAD, VG->dRCA, VG->unknown vessel); b. 02/2016 Cath: LM 99d, LAD 100ost, LCX 100ost, RCA 80ost, diffuse 85-79m. LIMA->LAD ok, VG->RCA ok. VG->unknown vessel 100.  . Diabetes mellitus without complication ([place])   . Dyslipidemia   . GERD (gastroesophageal reflux disease)   . Hemorrhoids   . 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 History of echocardiogram    a. 02/2017 Echo: EF 55-65%, no rwma. Nl DD. Ao sclerosis w/o stenosis. Mod TR. Mild MR.  Marland Kitchen Hypertension   .  Pulmonary embolism ([place])   . Thyroid disease      Social History   Socioeconomic History  . Marital status: Widowed    Spouse name: Not on file  . Number of children: 4  . Years of education: 16  . Highest education level: Bachelor's degree (e.g., BA, AB, BS)  Occupational History  . Occupation: retired  Tobacco Use  . Smoking status: Former Smoker    Packs/day: 1.50    Years: 25.00    Pack years: 37.50    Types: Cigarettes    Quit date: 1978    Years since quitting: 43.9  . Smokeless tobacco: Never Used  Vaping Use  . Vaping Use: Never used  Substance and Sexual Activity  . Alcohol use: Yes    Alcohol/week: 1.0 standard drink    Types: 1 Glasses of wine per week    Comment: socially   . Drug use: No  . Sexual activity: Not Currently  Other Topics Concern  . Not on file  Social History Narrative   Widower.   4 children, 7 grandchildren, 1 great grandchild.   Retired. Once worked as an account.    Enjoys exercising, spending time with family.    Social Determinants of Health   Financial Resource Strain:   . Difficulty of Paying Living Expenses: Not on file  Food Insecurity:   . Worried About Charity fundraiser in the Last Year: Not on file  .  Ran Out of Food in the Last Year: Not on file  Transportation Needs:   . Lack of Transportation (Medical): Not on file  . Lack of Transportation (Non-Medical): Not on file  Physical Activity:   . Days of Exercise per Week: Not on file  . Minutes of Exercise per Session: Not on file  Stress:   . Feeling of Stress : Not on file  Social Connections:   . Frequency of Communication with Friends and Family: Not on file  . Frequency of Social Gatherings with Friends and Family: Not on file  . Attends Religious Services: Not on file  . Active Member of Clubs or Organizations: Not on file  . Attends Archivist Meetings: Not on file  . Marital Status: Not on file  Intimate Partner Violence:   . Fear of Current or  Ex-Partner: Not on file  . Emotionally Abused: Not on file  . Physically Abused: Not on file  . Sexually Abused: Not on file    Past Surgical History:  Procedure Laterality Date  . CAROTID ENDARTERECTOMY Left 2004  . CORONARY ARTERY BYPASS GRAFT  1994   [name]  . TONSILLECTOMY AND ADENOIDECTOMY      Family History  Problem Relation Age of Onset  . Heart attack Mother   . Heart disease Mother   . Stroke Mother   . Diabetes Father   . Coronary artery disease Sister     Allergies  Allergen Reactions  . Heparin Other (See Comments)  . Patiromer Other (See Comments)  . Sodium Zirconium Cyclosilicate Other (See Comments)    Current Outpatient Medications on File Prior to Visit  Medication Sig Dispense Refill  . aspirin 81 MG tablet Take 81 mg by mouth every evening.     Marland Kitchen atorvastatin (LIPITOR) 40 MG tablet TAKE 1 TABLET BY MOUTH  DAILY 90 tablet 3  . Bacillus Coagulans-Inulin (ALIGN PREBIOTIC-PROBIOTIC PO) Take by mouth.    . betamethasone valerate (VALISONE) 0.1 % cream APPLY TO AFFECTED AREA TWICE A DAY 30 g 0  . Blood Glucose Monitoring Suppl (ONETOUCH VERIO) w/Device KIT 1 Device by Does not apply route daily. Use as instructed to test blood sugar daily 1 kit 0  . CALCIUM PO Take 600 mg by mouth every morning.     . cholecalciferol (VITAMIN D3) 25 MCG (1000 UT) tablet Take 1,000 Units by mouth daily.    Marland Kitchen epoetin alfa (EPOGEN,PROCRIT) 01027 UNIT/ML injection 10,000 Units. Every 2 to 4 weeks    . furosemide (LASIX) 40 MG tablet TAKE 1 TABLET BY MOUTH  DAILY 90 tablet 2  . glimepiride (AMARYL) 4 MG tablet TAKE 1 TABLET BY MOUTH  DAILY WITH BREAKFAST 120 tablet 2  . Hydrocortisone (PROCTOSOL HC RE) Place rectally as needed.    . Insulin Glargine (LANTUS) 100 UNIT/ML Solostar Pen Inject 6 Units into the skin daily.    . Insulin Pen Needle 29G X 12.7MM MISC BD Ultrafine Pen Needle Use as instructed to inject insulin daily 300 each 1  . IRON PO Take by mouth. 2 QAM 1  QPM    . Lancets (ONETOUCH DELICA PLUS [id number]) MISC USE AS INSTRUCT TO TEST BLOOD SUGAR ONCE DAILY 100 each 5  . levothyroxine (SYNTHROID) 175 MCG tablet TAKE 1 TABLET BY MOUTH IN  THE MORNING ON AN EMPTY  STOMACH WITH A FULL GLASS  OF WATER 120 tablet 2  . Multiple Vitamin (MULTIVITAMIN) capsule Take 1 capsule by mouth daily.    Marland Kitchen  Nutritional Supplements (JUICE PLUS FIBRE PO) Take by mouth.    . Omega-3 Fatty Acids (FISH OIL PO) Take 1,200 mg by mouth 4 (four) times daily.    Glory Rosebush VERIO test strip USE AS INSTRUCTED TO TEST BLOOD SUGAR ONCE DAILY 100 strip 5  . pantoprazole (PROTONIX) 20 MG tablet TAKE 1 TABLET BY MOUTH  DAILY 120 tablet 2  . ranolazine (RANEXA) 1000 MG SR tablet Take 1 tablet (1,000 mg total) by mouth 2 (two) times daily. 180 tablet 3  . timolol (TIMOPTIC) 0.5 % ophthalmic solution APPLY 1 DROP(S) IN BOTH EYES ONCE A DAY  11  . nitroGLYCERIN (NITROSTAT) 0.4 MG SL tablet Place 1 tablet (0.4 mg total) under the tongue every 5 (five) minutes as needed for chest pain. 25 tablet 3   No current facility-administered medications on file prior to visit.    BP 136/64   Pulse 71   Temp (!) 97 F (36.1 C) (Temporal)   Ht $R'5\' 10"'jT$  (1.778 m)   Wt 155 lb (70.3 kg)   SpO2 99%   BMI 22.24 kg/m    Objective:   Physical Exam HENT:     Ears:     Comments: Mild cerumen impaction to right canal, moderate cerumen impaction to left canal.   Bilateral TM's unremarkable post irrigation.  Pulmonary:     Effort: Pulmonary effort is normal.  Neurological:     Mental Status: He is alert.            Assessment & Plan:

## 2020-01-24 NOTE — Addendum Note (Signed)
Addended by: Ronaldo Miyamoto on: 01/24/2020 09:11 AM   Modules accepted: Orders

## 2020-01-24 NOTE — Patient Instructions (Signed)
You can try Debrox drops to reduce ear wax development.  It was a pleasure to see you today!   Earwax Buildup, Adult The ears produce a substance called earwax that helps keep bacteria out of the ear and protects the skin in the ear canal. Occasionally, earwax can build up in the ear and cause discomfort or hearing loss. What increases the risk? This condition is more likely to develop in people who:  Are male.  Are elderly.  Naturally produce more earwax.  Clean their ears often with cotton swabs.  Use earplugs often.  Use in-ear headphones often.  Wear hearing aids.  Have narrow ear canals.  Have earwax that is overly thick or sticky.  Have eczema.  Are dehydrated.  Have excess hair in the ear canal. What are the signs or symptoms? Symptoms of this condition include:  Reduced or muffled hearing.  A feeling of fullness in the ear or feeling that the ear is plugged.  Fluid coming from the ear.  Ear pain.  Ear itch.  Ringing in the ear.  Coughing.  An obvious piece of earwax that can be seen inside the ear canal. How is this diagnosed? This condition may be diagnosed based on:  Your symptoms.  Your medical history.  An ear exam. During the exam, your health care provider will look into your ear with an instrument called an otoscope. You may have tests, including a hearing test. How is this treated? This condition may be treated by:  Using ear drops to soften the earwax.  Having the earwax removed by a health care provider. The health care provider may: ? Flush the ear with water. ? Use an instrument that has a loop on the end (curette). ? Use a suction device.  Surgery to remove the wax buildup. This may be done in severe cases. Follow these instructions at home:   Take over-the-counter and prescription medicines only as told by your health care provider.  Do not put any objects, including cotton swabs, into your ear. You can clean the  opening of your ear canal with a washcloth or facial tissue.  Follow instructions from your health care provider about cleaning your ears. Do not over-clean your ears.  Drink enough fluid to keep your urine clear or pale yellow. This will help to thin the earwax.  Keep all follow-up visits as told by your health care provider. If earwax builds up in your ears often or if you use hearing aids, consider seeing your health care provider for routine, preventive ear cleanings. Ask your health care provider how often you should schedule your cleanings.  If you have hearing aids, clean them according to instructions from the manufacturer and your health care provider. Contact a health care provider if:  You have ear pain.  You develop a fever.  You have blood, pus, or other fluid coming from your ear.  You have hearing loss.  You have ringing in your ears that does not go away.  Your symptoms do not improve with treatment.  You feel like the room is spinning (vertigo). Summary  Earwax can build up in the ear and cause discomfort or hearing loss.  The most common symptoms of this condition include reduced or muffled hearing and a feeling of fullness in the ear or feeling that the ear is plugged.  This condition may be diagnosed based on your symptoms, your medical history, and an ear exam.  This condition may be treated by using ear  drops to soften the earwax or by having the earwax removed by a health care provider.  Do not put any objects, including cotton swabs, into your ear. You can clean the opening of your ear canal with a washcloth or facial tissue. This information is not intended to replace advice given to you by your health care provider. Make sure you discuss any questions you have with your health care provider. Document Revised: 01/31/2017 Document Reviewed: 05/01/2016 Elsevier Patient Education  2020 [employer].

## 2020-02-03 ENCOUNTER — Other Ambulatory Visit: Payer: Self-pay | Admitting: Internal Medicine

## 2020-02-03 DIAGNOSIS — I8393 Asymptomatic varicose veins of bilateral lower extremities: Secondary | ICD-10-CM

## 2020-02-04 DIAGNOSIS — I8393 Asymptomatic varicose veins of bilateral lower extremities: Secondary | ICD-10-CM | POA: Insufficient documentation

## 2020-02-04 NOTE — Telephone Encounter (Signed)
Last filled 11/22/2019... please advise  °

## 2020-02-04 NOTE — Telephone Encounter (Signed)
Refills sent to pharmacy. 

## 2020-02-04 NOTE — Assessment & Plan Note (Signed)
Using betamethasone valerate cream for years to reduce size of varicose veins externally. Refill provided.

## 2020-02-06 DIAGNOSIS — R42 Dizziness and giddiness: Secondary | ICD-10-CM

## 2020-02-10 ENCOUNTER — Other Ambulatory Visit: Payer: Self-pay | Admitting: Primary Care

## 2020-02-10 MED ORDER — MECLIZINE HCL 12.5 MG PO TABS: 12.5000 mg | ORAL_TABLET | Freq: Three times a day (TID) | ORAL | 0 refills | Status: DC | PRN

## 2020-02-11 ENCOUNTER — Encounter: Payer: Self-pay | Admitting: Oncology

## 2020-02-11 ENCOUNTER — Inpatient Hospital Stay (HOSPITAL_BASED_OUTPATIENT_CLINIC_OR_DEPARTMENT_OTHER): Payer: Medicare Other | Admitting: Oncology

## 2020-02-11 ENCOUNTER — Inpatient Hospital Stay: Payer: Medicare Other

## 2020-02-11 ENCOUNTER — Inpatient Hospital Stay: Payer: Medicare Other | Attending: Oncology

## 2020-02-11 VITALS — BP 172/71 | HR 67 | Temp 96.1°F | Resp 16 | Wt 154.9 lb

## 2020-02-11 DIAGNOSIS — N179 Acute kidney failure, unspecified: Secondary | ICD-10-CM | POA: Diagnosis not present

## 2020-02-11 DIAGNOSIS — D631 Anemia in chronic kidney disease: Secondary | ICD-10-CM | POA: Diagnosis not present

## 2020-02-11 DIAGNOSIS — Z20822 Contact with and (suspected) exposure to covid-19: Secondary | ICD-10-CM | POA: Diagnosis not present

## 2020-02-11 DIAGNOSIS — N183 Chronic kidney disease, stage 3 unspecified: Secondary | ICD-10-CM | POA: Diagnosis not present

## 2020-02-11 DIAGNOSIS — I453 Trifascicular block: Secondary | ICD-10-CM | POA: Diagnosis not present

## 2020-02-11 DIAGNOSIS — D7589 Other specified diseases of blood and blood-forming organs: Secondary | ICD-10-CM

## 2020-02-11 DIAGNOSIS — E1122 Type 2 diabetes mellitus with diabetic chronic kidney disease: Secondary | ICD-10-CM | POA: Diagnosis not present

## 2020-02-11 DIAGNOSIS — I6529 Occlusion and stenosis of unspecified carotid artery: Secondary | ICD-10-CM | POA: Diagnosis not present

## 2020-02-11 DIAGNOSIS — R55 Syncope and collapse: Secondary | ICD-10-CM | POA: Diagnosis not present

## 2020-02-11 DIAGNOSIS — M47812 Spondylosis without myelopathy or radiculopathy, cervical region: Secondary | ICD-10-CM | POA: Diagnosis not present

## 2020-02-11 DIAGNOSIS — R42 Dizziness and giddiness: Secondary | ICD-10-CM | POA: Diagnosis not present

## 2020-02-11 DIAGNOSIS — E1165 Type 2 diabetes mellitus with hyperglycemia: Secondary | ICD-10-CM | POA: Diagnosis not present

## 2020-02-11 DIAGNOSIS — Z66 Do not resuscitate: Secondary | ICD-10-CM | POA: Diagnosis not present

## 2020-02-11 DIAGNOSIS — G319 Degenerative disease of nervous system, unspecified: Secondary | ICD-10-CM | POA: Diagnosis not present

## 2020-02-11 DIAGNOSIS — E1151 Type 2 diabetes mellitus with diabetic peripheral angiopathy without gangrene: Secondary | ICD-10-CM | POA: Diagnosis not present

## 2020-02-11 DIAGNOSIS — M4802 Spinal stenosis, cervical region: Secondary | ICD-10-CM | POA: Diagnosis not present

## 2020-02-11 DIAGNOSIS — S199XXA Unspecified injury of neck, initial encounter: Secondary | ICD-10-CM | POA: Diagnosis not present

## 2020-02-11 DIAGNOSIS — S0990XA Unspecified injury of head, initial encounter: Secondary | ICD-10-CM | POA: Diagnosis not present

## 2020-02-11 DIAGNOSIS — I6782 Cerebral ischemia: Secondary | ICD-10-CM | POA: Diagnosis not present

## 2020-02-11 LAB — CBC WITH DIFFERENTIAL/PLATELET
Abs Immature Granulocytes: 0.03 10*3/uL (ref 0.00–0.07)
Basophils Absolute: 0 10*3/uL (ref 0.0–0.1)
Basophils Relative: 1 %
Eosinophils Absolute: 0.1 10*3/uL (ref 0.0–0.5)
Eosinophils Relative: 2 %
HCT: 30.6 % — ABNORMAL LOW (ref 39.0–52.0)
Hemoglobin: 10.5 g/dL — ABNORMAL LOW (ref 13.0–17.0)
Immature Granulocytes: 1 %
Lymphocytes Relative: 21 %
Lymphs Abs: 1.2 10*3/uL (ref 0.7–4.0)
MCH: 35.2 pg — ABNORMAL HIGH (ref 26.0–34.0)
MCHC: 34.3 g/dL (ref 30.0–36.0)
MCV: 102.7 fL — ABNORMAL HIGH (ref 80.0–100.0)
Monocytes Absolute: 0.5 10*3/uL (ref 0.1–1.0)
Monocytes Relative: 9 %
Neutro Abs: 3.9 10*3/uL (ref 1.7–7.7)
Neutrophils Relative %: 66 %
Platelets: 178 10*3/uL (ref 150–400)
RBC: 2.98 MIL/uL — ABNORMAL LOW (ref 4.22–5.81)
RDW: 12 % (ref 11.5–15.5)
WBC: 5.7 10*3/uL (ref 4.0–10.5)
nRBC: 0 % (ref 0.0–0.2)

## 2020-02-11 NOTE — Progress Notes (Signed)
Hematology/Oncology follow up note [facility] [phone] [phone]   Patient Care Team: Pleas Koch, NP as PCP - General (Internal Medicine) End, Harrell Gave, MD as PCP - Cardiology (Cardiology) Dingeldein, Remo Lipps, MD as Referring Physician (Ophthalmology) End, Harrell Gave, MD as Consulting Physician (Cardiology) Earlie Server, MD as Consulting Physician (Oncology)  REFERRING PROVIDER: Dr.Lateef REASON FOR VISIT Follow up for treatment of anemia of CKD, on erythropoietin replacement therapy.  HISTORY OF PRESENTING ILLNESS:  Tommy [name] is a  86 y.o.  male with PMH listed below who was referred to me for evaluation of anemia.  Patient recently removed from [place] to [place] to live with his son.  He has chronic kidney disease and anemia associated with CKD.  He reports that he has been getting Procrit 10,000 units every 2 weeks for many years.  Denies any recent thrombosis events.  Patient reports that at the last Procrit shot he received was back in September 2018 before he moved here. He establish care with Dr. Holley Raring for his CKD and he was referred to me for evaluation of anemia and possible Procrit shots.  Patient had lab work done at [facility] on 05/15/2017 hemoglobin 9.6, MCV 98, WBC 4.5, platelet count 1 70,000, normal differential use.   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 He has IVC filter in place.   INTERVAL HISTORY Tommy [name] is a 86 y.o. male who has above history reviewed by me today presents for follow-up for anemia due to CKD. Patient has been on erythropoietin therapy monthly if hemoglobin is less than 10. Patient reports feeling well today.  He reports feeling more fatigued.  With further questioning, he feels that his chronic fatigue level has not significantly worsened.  He does not feel having as much energy level as a  few years ago. Marland Kitchen  His appetite is good and weight has been stable.   Review of Systems  Constitutional: Positive for malaise/fatigue. Negative for chills, fever and weight loss.  HENT: Negative for sore throat.   Eyes: Negative for redness.  Respiratory: Negative for cough, shortness of breath and wheezing.   Cardiovascular: Negative for chest pain, palpitations and leg swelling.  Gastrointestinal: Negative for abdominal pain, blood in stool, nausea and vomiting.  Genitourinary: Negative for dysuria.  Musculoskeletal: Negative for myalgias.  Skin: Negative for rash.  Neurological: Negative for dizziness, tingling and tremors.  Endo/Heme/Allergies: Does not bruise/bleed easily.  Psychiatric/Behavioral: Negative for hallucinations.    MEDICAL HISTORY:  Past Medical History:  Diagnosis Date  . Anemia   . Carotid artery occlusion    a. 2004 s/p L carotid endarterectomy; b. 02/2019 U/S: 1-39% bilat ICA stenosis. <50% bilat CCA stenosis.  . CKD (chronic kidney disease) stage 3, GFR 30-59 ml/min (HCC)   . Coronary artery disease    a. 1994 s/p CABG x 3, [place] (LIMA->LAD, VG->dRCA, VG->unknown vessel); b. 02/2016 Cath: LM 99d, LAD 100ost, LCX 100ost, RCA 80ost, diffuse 85-81m. LIMA->LAD ok, VG->RCA ok. VG->unknown vessel 100.  . Diabetes mellitus without complication ([place])   . Dyslipidemia   . GERD (gastroesophageal reflux disease)   . Hemorrhoids   . 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 History of echocardiogram    a. 02/2017 Echo: EF 55-65%, no rwma. Nl DD. Ao sclerosis w/o stenosis. Mod TR. Mild MR.  Marland Kitchen Hypertension   . Pulmonary embolism ([place])   . Thyroid  disease     SURGICAL HISTORY: Past Surgical History:  Procedure Laterality Date  . CAROTID ENDARTERECTOMY Left 2004  . CORONARY ARTERY BYPASS GRAFT  1994   [name]  . TONSILLECTOMY AND ADENOIDECTOMY      SOCIAL HISTORY: Social History   Socioeconomic History  . Marital status: Widowed     Spouse name: Not on file  . Number of children: 4  . Years of education: 16  . Highest education level: Bachelor's degree (e.g., BA, AB, BS)  Occupational History  . Occupation: retired  Tobacco Use  . Smoking status: Former Smoker    Packs/day: 1.50    Years: 25.00    Pack years: 37.50    Types: Cigarettes    Quit date: 1978    Years since quitting: 43.9  . Smokeless tobacco: Never Used  Vaping Use  . Vaping Use: Never used  Substance and Sexual Activity  . Alcohol use: Yes    Alcohol/week: 1.0 standard drink    Types: 1 Glasses of wine per week    Comment: socially   . Drug use: No  . Sexual activity: Not Currently  Other Topics Concern  . Not on file  Social History Narrative   Widower.   4 children, 7 grandchildren, 1 great grandchild.   Retired. Once worked as an account.    Enjoys exercising, spending time with family.    Social Determinants of Health   Financial Resource Strain: Not on file  Food Insecurity: Not on file  Transportation Needs: Not on file  Physical Activity: Not on file  Stress: Not on file  Social Connections: Not on file  Intimate Partner Violence: Not on file    FAMILY HISTORY: Family History  Problem Relation Age of Onset  . Heart attack Mother   . Heart disease Mother   . Stroke Mother   . Diabetes Father   . Coronary artery disease Sister     ALLERGIES:  is allergic to heparin, patiromer, and sodium zirconium cyclosilicate.  MEDICATIONS:  Current Outpatient Medications  Medication Sig Dispense Refill  . aspirin 81 MG tablet Take 81 mg by mouth every evening.     Marland Kitchen atorvastatin (LIPITOR) 40 MG tablet TAKE 1 TABLET BY MOUTH  DAILY 90 tablet 3  . Bacillus Coagulans-Inulin (ALIGN PREBIOTIC-PROBIOTIC PO) Take by mouth.    . betamethasone valerate (VALISONE) 0.1 % cream APPLY TO AFFECTED AREA TWICE A DAY 30 g 0  . Blood Glucose Monitoring Suppl (ONETOUCH VERIO) w/Device KIT 1 Device by Does not apply route daily. Use as instructed  to test blood sugar daily 1 kit 0  . CALCIUM PO Take 600 mg by mouth every morning.     . cholecalciferol (VITAMIN D3) 25 MCG (1000 UT) tablet Take 1,000 Units by mouth daily.    Marland Kitchen epoetin alfa (EPOGEN,PROCRIT) 09604 UNIT/ML injection 10,000 Units. Every 2 to 4 weeks    . furosemide (LASIX) 40 MG tablet TAKE 1 TABLET BY MOUTH  DAILY 90 tablet 2  . glimepiride (AMARYL) 4 MG tablet TAKE 1 TABLET BY MOUTH  DAILY WITH BREAKFAST 120 tablet 2  . Hydrocortisone (PROCTOSOL HC RE) Place rectally as needed.    . Insulin Glargine (LANTUS) 100 UNIT/ML Solostar Pen Inject 6 Units into the skin daily.    . Insulin Pen Needle 29G X 12.7MM MISC BD Ultrafine Pen Needle Use as instructed to inject insulin daily 300 each 1  . IRON PO Take by mouth. 2 QAM 1 QPM    .  Lancets (ONETOUCH DELICA PLUS [id number]) MISC USE AS INSTRUCT TO TEST BLOOD SUGAR ONCE DAILY 100 each 5  . levothyroxine (SYNTHROID) 175 MCG tablet TAKE 1 TABLET BY MOUTH IN  THE MORNING ON AN EMPTY  STOMACH WITH A FULL GLASS  OF WATER 120 tablet 2  . meclizine (ANTIVERT) 12.5 MG tablet Take 1 tablet (12.5 mg total) by mouth 3 (three) times daily as needed for dizziness. 30 tablet 0  . Multiple Vitamin (MULTIVITAMIN) capsule Take 1 capsule by mouth daily.    . Nutritional Supplements (JUICE PLUS FIBRE PO) Take by mouth.    . Omega-3 Fatty Acids (FISH OIL PO) Take 1,200 mg by mouth 4 (four) times daily.    Glory Rosebush VERIO test strip USE AS INSTRUCTED TO TEST BLOOD SUGAR ONCE DAILY 100 strip 5  . pantoprazole (PROTONIX) 20 MG tablet TAKE 1 TABLET BY MOUTH  DAILY 120 tablet 2  . ranolazine (RANEXA) 1000 MG SR tablet Take 1 tablet (1,000 mg total) by mouth 2 (two) times daily. 180 tablet 3  . timolol (TIMOPTIC) 0.5 % ophthalmic solution APPLY 1 DROP(S) IN BOTH EYES ONCE A DAY  11  . nitroGLYCERIN (NITROSTAT) 0.4 MG SL tablet Place 1 tablet (0.4 mg total) under the tongue every 5 (five) minutes as needed for chest pain. 25 tablet 3   No current  facility-administered medications for this visit.     PHYSICAL EXAMINATION: ECOG PERFORMANCE STATUS: 1 - Symptomatic but completely ambulatory Vitals:   02/11/20 1313  BP: (!) 172/71  Pulse: 67  Resp: 16  Temp: (!) 96.1 F (35.6 C)   Filed Weights   02/11/20 1313  Weight: 154 lb 14.4 oz (70.3 kg)    Physical Exam Constitutional:      General: He is not in acute distress.    Appearance: He is not diaphoretic.     Comments: Patient walks independently  HENT:     Head: Normocephalic and atraumatic.     Nose: Nose normal.     Mouth/Throat:     Pharynx: No oropharyngeal exudate.  Eyes:     General: No scleral icterus.    Pupils: Pupils are equal, round, and reactive to light.  Cardiovascular:     Rate and Rhythm: Normal rate and regular rhythm.     Heart sounds: No murmur heard.   Pulmonary:     Effort: Pulmonary effort is normal. No respiratory distress.     Breath sounds: No rales.  Chest:     Chest wall: No tenderness.  Abdominal:     General: There is no distension.     Palpations: Abdomen is soft.     Tenderness: There is no abdominal tenderness.  Musculoskeletal:        General: Normal range of motion.     Cervical back: Normal range of motion and neck supple.  Skin:    General: Skin is warm and dry.     Findings: No erythema.  Neurological:     Mental Status: He is alert and oriented to person, place, and time.     Cranial Nerves: No cranial nerve deficit.     Motor: No abnormal muscle tone.     Coordination: Coordination normal.  Psychiatric:        Mood and Affect: Affect normal.      LABORATORY DATA:  I have reviewed the data as listed Lab Results  Component Value Date   WBC 5.7 02/11/2020   HGB 10.5 (L) 02/11/2020   HCT 30.6 (  L) 02/11/2020   MCV 102.7 (H) 02/11/2020   PLT 178 02/11/2020   Recent Labs    03/29/19 1222 04/08/19 1111 04/20/19 1449 06/08/19 1750  NA 136 133* 135 135  K 5.0 5.2* 5.2* 5.4*  CL 101 102 100 100  CO2 _0 GLUCOSE 237* 240* 161* 95  BUN 42* 48* 34* 41*  CREATININE 1.67* 1.86* 1.60* 2.02*  CALCIUM 8.9 9.4 9.5 9.8  GFRNONAA 35* 31*  --  28*  GFRAA 41* 36*  --  33*  PROT  --   --  6.7  --   ALBUMIN  --   --  4.0  --   AST  --   --  22  --   ALT  --   --  22  --   ALKPHOS  --   --  80  --   BILITOT  --   --  0.4  --      Iron/TIBC/Ferritin/ %Sat    Component Value Date/Time   IRON 109 05/23/2017 1153   TIBC 311 05/23/2017 1153   FERRITIN 670 (H) 05/23/2017 1153   IRONPCTSAT 35 05/23/2017 1153   SPEP negative for M spike.   RADIOGRAPHIC STUDIES: I have personally reviewed the radiological images as listed and agreed with the findings in the report. 01/19/2018 CXR FINDINGS: The lungs are clear and negative for focal airspace consolidation, pulmonary edema or suspicious pulmonary nodule. No pleural effusion or pneumothorax. Cardiac and mediastinal contours are within normal limits. Patient is status post median sternotomy with evidence of prior CABG including LIMA bypass. Trace linear scarring versus atelectasis in the lingula. No acute fracture or lytic or blastic osseous lesions. The visualized upper abdominal bowel gas pattern is unremarkable.  IMPRESSION: No active cardiopulmonary disease. ASSESSMENT & PLAN:  1. Anemia of chronic renal failure, stage 3 (moderate), unspecified whether stage 3a or 3b CKD (HCC)    #Chronic anemia secondary to chronic kidney disease. Labs reviewed and discussed with patient.  Hemoglobin is 10.  No need for Retacrit.  Macrocytosis, I will check folate and vitamin B12 level at next visit. Follow-up in 6 weeks, 10 weeks 14 weeks for H&H +/- Retacrit if hemoglobin is less than 10.  Follow-up with me in 4 months. All questions were answered. The patient knows to call the clinic with any problems questions or concerns. Orders Placed This Encounter  Procedures  . CBC with Differential/Platelet    Standing Status:   Future    Standing  Expiration Date:   02/10/2021  . Ferritin    Standing Status:   Future    Standing Expiration Date:   02/10/2021  . Iron and TIBC    Standing Status:   Future    Standing Expiration Date:   02/10/2021    Earlie Server, MD, PhD  02/11/2020

## 2020-02-11 NOTE — Progress Notes (Signed)
Energy level is declining.

## 2020-02-13 ENCOUNTER — Emergency Department: Payer: Medicare Other

## 2020-02-13 ENCOUNTER — Other Ambulatory Visit: Payer: Self-pay

## 2020-02-13 ENCOUNTER — Observation Stay: Payer: Medicare Other

## 2020-02-13 ENCOUNTER — Inpatient Hospital Stay
Admission: EM | Admit: 2020-02-13 | Discharge: 2020-02-15 | DRG: 309 | Disposition: A | Discharge status: Home / Self Care | Payer: Medicare Other | Attending: Obstetrics and Gynecology | Admitting: Obstetrics and Gynecology

## 2020-02-13 ENCOUNTER — Encounter: Payer: Self-pay | Admitting: Emergency Medicine

## 2020-02-13 DIAGNOSIS — S0990XA Unspecified injury of head, initial encounter: Secondary | ICD-10-CM | POA: Diagnosis not present

## 2020-02-13 DIAGNOSIS — E875 Hyperkalemia: Secondary | ICD-10-CM | POA: Diagnosis not present

## 2020-02-13 DIAGNOSIS — N189 Chronic kidney disease, unspecified: Secondary | ICD-10-CM

## 2020-02-13 DIAGNOSIS — R54 Age-related physical debility: Secondary | ICD-10-CM | POA: Diagnosis present

## 2020-02-13 DIAGNOSIS — Z79890 Hormone replacement therapy (postmenopausal): Secondary | ICD-10-CM

## 2020-02-13 DIAGNOSIS — Y929 Unspecified place or not applicable: Secondary | ICD-10-CM

## 2020-02-13 DIAGNOSIS — I779 Disorder of arteries and arterioles, unspecified: Secondary | ICD-10-CM | POA: Diagnosis present

## 2020-02-13 DIAGNOSIS — I6523 Occlusion and stenosis of bilateral carotid arteries: Secondary | ICD-10-CM | POA: Diagnosis present

## 2020-02-13 DIAGNOSIS — Z66 Do not resuscitate: Secondary | ICD-10-CM | POA: Diagnosis present

## 2020-02-13 DIAGNOSIS — Z7982 Long term (current) use of aspirin: Secondary | ICD-10-CM

## 2020-02-13 DIAGNOSIS — D75829 Heparin-induced thrombocytopenia, unspecified: Secondary | ICD-10-CM

## 2020-02-13 DIAGNOSIS — I10 Essential (primary) hypertension: Secondary | ICD-10-CM | POA: Diagnosis not present

## 2020-02-13 DIAGNOSIS — E119 Type 2 diabetes mellitus without complications: Secondary | ICD-10-CM

## 2020-02-13 DIAGNOSIS — I129 Hypertensive chronic kidney disease with stage 1 through stage 4 chronic kidney disease, or unspecified chronic kidney disease: Secondary | ICD-10-CM | POA: Diagnosis present

## 2020-02-13 DIAGNOSIS — I6782 Cerebral ischemia: Secondary | ICD-10-CM | POA: Diagnosis not present

## 2020-02-13 DIAGNOSIS — Z9889 Other specified postprocedural states: Secondary | ICD-10-CM

## 2020-02-13 DIAGNOSIS — E039 Hypothyroidism, unspecified: Secondary | ICD-10-CM | POA: Diagnosis present

## 2020-02-13 DIAGNOSIS — G319 Degenerative disease of nervous system, unspecified: Secondary | ICD-10-CM | POA: Diagnosis not present

## 2020-02-13 DIAGNOSIS — E1165 Type 2 diabetes mellitus with hyperglycemia: Secondary | ICD-10-CM | POA: Diagnosis not present

## 2020-02-13 DIAGNOSIS — R42 Dizziness and giddiness: Secondary | ICD-10-CM | POA: Diagnosis not present

## 2020-02-13 DIAGNOSIS — Z888 Allergy status to other drugs, medicaments and biological substances status: Secondary | ICD-10-CM

## 2020-02-13 DIAGNOSIS — Z86711 Personal history of pulmonary embolism: Secondary | ICD-10-CM

## 2020-02-13 DIAGNOSIS — E782 Mixed hyperlipidemia: Secondary | ICD-10-CM | POA: Diagnosis not present

## 2020-02-13 DIAGNOSIS — N179 Acute kidney failure, unspecified: Secondary | ICD-10-CM

## 2020-02-13 DIAGNOSIS — Z20822 Contact with and (suspected) exposure to covid-19: Secondary | ICD-10-CM | POA: Diagnosis present

## 2020-02-13 DIAGNOSIS — M4802 Spinal stenosis, cervical region: Secondary | ICD-10-CM | POA: Diagnosis not present

## 2020-02-13 DIAGNOSIS — Z8249 Family history of ischemic heart disease and other diseases of the circulatory system: Secondary | ICD-10-CM

## 2020-02-13 DIAGNOSIS — E785 Hyperlipidemia, unspecified: Secondary | ICD-10-CM | POA: Diagnosis present

## 2020-02-13 DIAGNOSIS — R55 Syncope and collapse: Secondary | ICD-10-CM

## 2020-02-13 DIAGNOSIS — I25118 Atherosclerotic heart disease of native coronary artery with other forms of angina pectoris: Secondary | ICD-10-CM | POA: Diagnosis present

## 2020-02-13 DIAGNOSIS — D7582 Heparin induced thrombocytopenia (HIT): Secondary | ICD-10-CM

## 2020-02-13 DIAGNOSIS — I739 Peripheral vascular disease, unspecified: Secondary | ICD-10-CM | POA: Diagnosis present

## 2020-02-13 DIAGNOSIS — R778 Other specified abnormalities of plasma proteins: Secondary | ICD-10-CM

## 2020-02-13 DIAGNOSIS — Z951 Presence of aortocoronary bypass graft: Secondary | ICD-10-CM

## 2020-02-13 DIAGNOSIS — Z833 Family history of diabetes mellitus: Secondary | ICD-10-CM

## 2020-02-13 DIAGNOSIS — S199XXA Unspecified injury of neck, initial encounter: Secondary | ICD-10-CM | POA: Diagnosis not present

## 2020-02-13 DIAGNOSIS — R739 Hyperglycemia, unspecified: Secondary | ICD-10-CM

## 2020-02-13 DIAGNOSIS — Z87891 Personal history of nicotine dependence: Secondary | ICD-10-CM

## 2020-02-13 DIAGNOSIS — M47812 Spondylosis without myelopathy or radiculopathy, cervical region: Secondary | ICD-10-CM | POA: Diagnosis not present

## 2020-02-13 DIAGNOSIS — I440 Atrioventricular block, first degree: Secondary | ICD-10-CM | POA: Diagnosis present

## 2020-02-13 DIAGNOSIS — E1122 Type 2 diabetes mellitus with diabetic chronic kidney disease: Secondary | ICD-10-CM | POA: Diagnosis present

## 2020-02-13 DIAGNOSIS — E860 Dehydration: Secondary | ICD-10-CM | POA: Diagnosis present

## 2020-02-13 DIAGNOSIS — I6529 Occlusion and stenosis of unspecified carotid artery: Secondary | ICD-10-CM | POA: Diagnosis not present

## 2020-02-13 DIAGNOSIS — W19XXXA Unspecified fall, initial encounter: Secondary | ICD-10-CM | POA: Diagnosis present

## 2020-02-13 DIAGNOSIS — I453 Trifascicular block: Principal | ICD-10-CM | POA: Diagnosis present

## 2020-02-13 DIAGNOSIS — Z79899 Other long term (current) drug therapy: Secondary | ICD-10-CM

## 2020-02-13 DIAGNOSIS — N1832 Chronic kidney disease, stage 3b: Secondary | ICD-10-CM | POA: Diagnosis present

## 2020-02-13 DIAGNOSIS — D638 Anemia in other chronic diseases classified elsewhere: Secondary | ICD-10-CM | POA: Diagnosis present

## 2020-02-13 DIAGNOSIS — E1151 Type 2 diabetes mellitus with diabetic peripheral angiopathy without gangrene: Secondary | ICD-10-CM | POA: Diagnosis present

## 2020-02-13 DIAGNOSIS — K219 Gastro-esophageal reflux disease without esophagitis: Secondary | ICD-10-CM | POA: Diagnosis present

## 2020-02-13 DIAGNOSIS — Z823 Family history of stroke: Secondary | ICD-10-CM

## 2020-02-13 LAB — MAGNESIUM: Magnesium: 2.3 mg/dL (ref 1.7–2.4)

## 2020-02-13 LAB — URINALYSIS, COMPLETE (UACMP) WITH MICROSCOPIC
Bacteria, UA: NONE SEEN
Bilirubin Urine: NEGATIVE
Glucose, UA: >=500 mg/dL — AB
Hgb urine dipstick: NEGATIVE
Ketones, ur: NEGATIVE mg/dL
Leukocytes,Ua: NEGATIVE
Nitrite: NEGATIVE
Protein, ur: NEGATIVE mg/dL
Specific Gravity, Urine: 1.009 (ref 1.005–1.030)
pH: 6 (ref 5.0–8.0)

## 2020-02-13 LAB — HEPATIC FUNCTION PANEL
ALT: 23 U/L (ref 0–44)
AST: 29 U/L (ref 15–41)
Albumin: 3.6 g/dL (ref 3.5–5.0)
Alkaline Phosphatase: 76 U/L (ref 38–126)
Bilirubin, Direct: <0.1 mg/dL (ref 0.0–0.2)
Total Bilirubin: 0.8 mg/dL (ref 0.3–1.2)
Total Protein: 7.6 g/dL (ref 6.5–8.1)

## 2020-02-13 LAB — CBG MONITORING, ED
Glucose-Capillary: 101 mg/dL — ABNORMAL HIGH (ref 70–99)
Glucose-Capillary: 153 mg/dL — ABNORMAL HIGH (ref 70–99)
Glucose-Capillary: 180 mg/dL — ABNORMAL HIGH (ref 70–99)
Glucose-Capillary: 312 mg/dL — ABNORMAL HIGH (ref 70–99)

## 2020-02-13 LAB — BASIC METABOLIC PANEL
Anion gap: 8 (ref 5–15)
BUN: 48 mg/dL — ABNORMAL HIGH (ref 8–23)
CO2: 29 mmol/L (ref 22–32)
Calcium: 9.7 mg/dL (ref 8.9–10.3)
Chloride: 97 mmol/L — ABNORMAL LOW (ref 98–111)
Creatinine, Ser: 2.26 mg/dL — ABNORMAL HIGH (ref 0.61–1.24)
GFR, Estimated: 27 mL/min — ABNORMAL LOW (ref >60)
Glucose, Bld: 334 mg/dL — ABNORMAL HIGH (ref 70–99)
Potassium: 5.1 mmol/L (ref 3.5–5.1)
Sodium: 134 mmol/L — ABNORMAL LOW (ref 135–145)

## 2020-02-13 LAB — CBC
HCT: 31.3 % — ABNORMAL LOW (ref 39.0–52.0)
Hemoglobin: 10.6 g/dL — ABNORMAL LOW (ref 13.0–17.0)
MCH: 35 pg — ABNORMAL HIGH (ref 26.0–34.0)
MCHC: 33.9 g/dL (ref 30.0–36.0)
MCV: 103.3 fL — ABNORMAL HIGH (ref 80.0–100.0)
Platelets: 203 10*3/uL (ref 150–400)
RBC: 3.03 MIL/uL — ABNORMAL LOW (ref 4.22–5.81)
RDW: 11.9 % (ref 11.5–15.5)
WBC: 7.4 10*3/uL (ref 4.0–10.5)
nRBC: 0 % (ref 0.0–0.2)

## 2020-02-13 LAB — RESP PANEL BY RT-PCR (FLU A&B, COVID) ARPGX2
Influenza A by PCR: NEGATIVE
Influenza B by PCR: NEGATIVE
SARS Coronavirus 2 by RT PCR: NEGATIVE

## 2020-02-13 LAB — TROPONIN I (HIGH SENSITIVITY)
Troponin I (High Sensitivity): 26 ng/L — ABNORMAL HIGH (ref <18)
Troponin I (High Sensitivity): 29 ng/L — ABNORMAL HIGH (ref <18)

## 2020-02-13 LAB — BRAIN NATRIURETIC PEPTIDE: B Natriuretic Peptide: 64.7 pg/mL (ref 0.0–100.0)

## 2020-02-13 MED ORDER — SODIUM CHLORIDE 0.9 % IV SOLN: INTRAVENOUS | Status: AC

## 2020-02-13 MED ORDER — ACETAMINOPHEN 325 MG PO TABS: 650.0000 mg | ORAL_TABLET | Freq: Four times a day (QID) | ORAL | Status: DC | PRN

## 2020-02-13 MED ORDER — LACTATED RINGERS IV BOLUS
500.0000 mL | Freq: Once | INTRAVENOUS | Status: AC
  Administered 2020-02-13: 500 mL via INTRAVENOUS

## 2020-02-13 MED ORDER — ONDANSETRON HCL 4 MG/2ML IJ SOLN: 4.0000 mg | Freq: Four times a day (QID) | INTRAMUSCULAR | Status: DC | PRN

## 2020-02-13 MED ORDER — INSULIN ASPART 100 UNIT/ML ~~LOC~~ SOLN: 0.0000 [IU] | Freq: Every day | SUBCUTANEOUS | Status: DC

## 2020-02-13 MED ORDER — ONDANSETRON HCL 4 MG PO TABS: 4.0000 mg | ORAL_TABLET | Freq: Four times a day (QID) | ORAL | Status: DC | PRN

## 2020-02-13 MED ORDER — ASPIRIN 81 MG PO CHEW
324.0000 mg | CHEWABLE_TABLET | Freq: Once | ORAL | Status: AC
  Administered 2020-02-13: 324 mg via ORAL
  Filled 2020-02-13: qty 4

## 2020-02-13 MED ORDER — ACETAMINOPHEN 650 MG RE SUPP: 650.0000 mg | Freq: Four times a day (QID) | RECTAL | Status: DC | PRN

## 2020-02-13 MED ORDER — SODIUM CHLORIDE 0.9% FLUSH
3.0000 mL | Freq: Two times a day (BID) | INTRAVENOUS | Status: DC
  Administered 2020-02-15: 3 mL via INTRAVENOUS

## 2020-02-13 MED ORDER — INSULIN ASPART 100 UNIT/ML ~~LOC~~ SOLN
0.0000 [IU] | SUBCUTANEOUS | Status: DC
  Administered 2020-02-13: 11 [IU] via SUBCUTANEOUS
  Administered 2020-02-14: 3 [IU] via SUBCUTANEOUS
  Filled 2020-02-13 (×2): qty 1

## 2020-02-13 MED ORDER — INSULIN ASPART 100 UNIT/ML ~~LOC~~ SOLN
0.0000 [IU] | Freq: Three times a day (TID) | SUBCUTANEOUS | Status: DC
  Administered 2020-02-14: 1 [IU] via SUBCUTANEOUS
  Administered 2020-02-14: 3 [IU] via SUBCUTANEOUS
  Filled 2020-02-13 (×2): qty 1

## 2020-02-13 MED ORDER — ENOXAPARIN SODIUM 30 MG/0.3ML ~~LOC~~ SOLN
30.0000 mg | SUBCUTANEOUS | Status: DC
  Administered 2020-02-13: 30 mg via SUBCUTANEOUS
  Filled 2020-02-13: qty 0.3

## 2020-02-13 NOTE — ED Notes (Signed)
Orthostatic Vitals: 1818: 168/54, HR 62 1822: 162/58, HR 60 1826: 167/62, HR 62

## 2020-02-13 NOTE — ED Provider Notes (Signed)
[facility] Emergency Department Provider Note  ____________________________________________   Event Date/Time   First MD Initiated Contact with Patient 02/13/20 1725     (approximate)  I have reviewed the triage vital signs and the nursing notes.   HISTORY  Chief Complaint Dizziness and Fall   HPI Tommy [name] is a 86 y.o. male no past medical history of CAD s/p remote CABG with stable angina, PAD, carotid artery stenosis status post left CEA, PE status post IVC filter not anticoagulated, DM, HTN, hypothyroidism, anemia, and CKD who presents accompanied by his son for assessment of worsening dizziness for last couple weeks associated with a fall last night.  Patient denies any LOC but states he felt lightheaded and fell hitting the back of his head.  He denies hitting anywhere else including his back or extremities.  Endorses couple weeks of worsening lightheadedness and he states this is not vertigo.  He also is concerned that he is sleeping more than usual.  He denies any acute chest pain, cough, shortness of breath, vision changes, abdominal pain, nausea, vomiting, diarrhea, dysuria, rash or focal extremity pain weakness numbness or tingling.  No other recent falls or injuries to the last couple days.         Past Medical History:  Diagnosis Date  . Anemia   . Carotid artery occlusion    a. 2004 s/p L carotid endarterectomy; b. 02/2019 U/S: 1-39% bilat ICA stenosis. <50% bilat CCA stenosis.  . CKD (chronic kidney disease) stage 3, GFR 30-59 ml/min (HCC)   . Coronary artery disease    a. 1994 s/p CABG x 3, [place], [place] (LIMA->LAD, VG->dRCA, VG->unknown vessel); b. 02/2016 Cath: LM 99d, LAD 100ost, LCX 100ost, RCA 80ost, diffuse 85-73m. LIMA->LAD ok, VG->RCA ok. VG->unknown vessel 100.  . Diabetes mellitus without complication ([place])   . Dyslipidemia   . GERD (gastroesophageal reflux disease)   . Hemorrhoids   . Heparin induced thrombocytopenia  (HCC)   . History of echocardiogram    a. 02/2017 Echo: EF 55-65%, no rwma. Nl DD. Ao sclerosis w/o stenosis. Mod TR. Mild MR.  Marland Kitchen Hypertension   . Pulmonary embolism ([place])   . Thyroid disease     Patient Active Problem List   Diagnosis Date Noted  . Acute kidney injury superimposed on CKD 3b ([place]) 02/13/2020  . Postural dizziness with presyncope 02/13/2020  . Elevated troponin 02/13/2020  . Varicose veins of both lower extremities 02/04/2020  . Cerumen impaction 01/24/2020  . Nocturia 01/13/2020  . Incontinence of feces 10/28/2019  . Trifascicular block 10/20/2019  . PAD (peripheral artery disease) ([place]) 10/20/2019  . Fall 08/04/2019  . Generalized weakness 06/09/2019  . First degree AV block 12/24/2018  . Coronary artery disease of native heart with stable angina pectoris ([place]) 03/18/2018  . Bilateral carotid artery stenosis 09/17/2017  . Anemia of chronic disease 09/17/2017  . Anemia of chronic renal failure, stage 3 (moderate) ([place]) 05/23/2017  . Aortic valve sclerosis 02/04/2017  . Carotid artery disease ([place]) 02/04/2017  . H/O endarterectomy 02/04/2017  . Hyperlipidemia LDL goal <70 02/04/2017  . Essential hypertension 02/04/2017  . GERD (gastroesophageal reflux disease) 12/10/2016  . Stage 3 chronic kidney disease ([place]) 12/09/2016  . Coronary artery disease of native artery of native heart with stable angina pectoris ([place]) 12/09/2016  . Type 2 diabetes mellitus ([place]) 12/09/2016  . Hypothyroidism 12/09/2016    Past Surgical History:  Procedure Laterality Date  . CAROTID ENDARTERECTOMY Left 2004  . CORONARY  ARTERY BYPASS GRAFT  1994   [name]  . TONSILLECTOMY AND ADENOIDECTOMY      Prior to Admission medications   Medication Sig Start Date End Date Taking? Authorizing Provider  aspirin 81 MG tablet Take 81 mg by mouth every evening.     [provider]  atorvastatin (LIPITOR) 40 MG tablet TAKE 1 TABLET BY MOUTH  DAILY 11/01/19   Pleas Koch, NP  Bacillus Coagulans-Inulin (ALIGN PREBIOTIC-PROBIOTIC PO) Take by mouth.    [provider]  betamethasone valerate (VALISONE) 0.1 % cream APPLY TO AFFECTED AREA TWICE A DAY 02/04/20   Pleas Koch, NP  Blood Glucose Monitoring Suppl (ONETOUCH VERIO) w/Device KIT 1 Device by Does not apply route daily. Use as instructed to test blood sugar daily 04/22/17   Pleas Koch, NP  CALCIUM PO Take 600 mg by mouth every morning.     [provider]  cholecalciferol (VITAMIN D3) 25 MCG (1000 UT) tablet Take 1,000 Units by mouth daily.    [provider]  epoetin alfa (EPOGEN,PROCRIT) 40347 UNIT/ML injection 10,000 Units. Every 2 to 4 weeks    [provider]  furosemide (LASIX) 40 MG tablet TAKE 1 TABLET BY MOUTH  DAILY 09/07/19   Pleas Koch, NP  glimepiride (AMARYL) 4 MG tablet TAKE 1 TABLET BY MOUTH  DAILY WITH BREAKFAST 01/04/20   Pleas Koch, NP  Hydrocortisone (PROCTOSOL HC RE) Place rectally as needed.    [provider]  Insulin Glargine (LANTUS) 100 UNIT/ML Solostar Pen Inject 6 Units into the skin daily.    [provider]  Insulin Pen Needle 29G X 12.7MM MISC BD Ultrafine Pen Needle Use as instructed to inject insulin daily 02/18/17   Pleas Koch, NP  IRON PO Take by mouth. 2 QAM 1 QPM    [provider]  Lancets (ONETOUCH DELICA PLUS [id number]) [place] USE AS INSTRUCT TO TEST BLOOD SUGAR ONCE DAILY 07/05/19   Pleas Koch, NP  levothyroxine (SYNTHROID) 175 MCG tablet TAKE 1 TABLET BY MOUTH IN  THE MORNING ON AN EMPTY  STOMACH WITH A FULL GLASS  OF WATER 01/04/20   Pleas Koch, NP  meclizine (ANTIVERT) 12.5 MG tablet Take 1 tablet (12.5 mg total) by mouth 3 (three) times daily as needed for dizziness. 02/10/20   Pleas Koch, NP  Multiple Vitamin (MULTIVITAMIN) capsule Take 1 capsule by mouth daily.    [provider]  nitroGLYCERIN (NITROSTAT) 0.4 MG SL tablet Place 1 tablet (0.4  mg total) under the tongue every 5 (five) minutes as needed for chest pain. 02/19/19 06/23/19  Theora Gianotti, NP  Nutritional Supplements (JUICE PLUS FIBRE PO) Take by mouth.    [provider]  Omega-3 Fatty Acids (FISH OIL PO) Take 1,200 mg by mouth 4 (four) times daily.    [provider]  [facility] VERIO test strip USE AS INSTRUCTED TO TEST BLOOD SUGAR ONCE DAILY 07/26/19   Pleas Koch, NP  pantoprazole (PROTONIX) 20 MG tablet TAKE 1 TABLET BY MOUTH  DAILY 11/12/19   Pleas Koch, NP  ranolazine (RANEXA) 1000 MG SR tablet Take 1 tablet (1,000 mg total) by mouth 2 (two) times daily. 02/19/19   Theora Gianotti, NP  timolol (TIMOPTIC) 0.5 % ophthalmic solution APPLY 1 DROP(S) IN BOTH EYES ONCE A DAY 03/17/17   [provider]    Allergies Heparin, Patiromer, and Sodium zirconium cyclosilicate  Family History  Problem Relation Age  of Onset  . Heart attack Mother   . Heart disease Mother   . Stroke Mother   . Diabetes Father   . Coronary artery disease Sister     Social History Social History   Tobacco Use  . Smoking status: Former Smoker    Packs/day: 1.50    Years: 25.00    Pack years: 37.50    Types: Cigarettes    Quit date: 1978    Years since quitting: 43.9  . Smokeless tobacco: Never Used  Vaping Use  . Vaping Use: Never used  Substance Use Topics  . Alcohol use: Yes    Alcohol/week: 1.0 standard drink    Types: 1 Glasses of wine per week    Comment: socially   . Drug use: No    Review of Systems  Review of Systems  Constitutional: Positive for malaise/fatigue. Negative for chills and fever.  HENT: Negative for sore throat.   Eyes: Negative for pain.  Respiratory: Negative for cough and stridor.   Cardiovascular: Negative for chest pain.  Gastrointestinal: Negative for vomiting.  Genitourinary: Negative for dysuria.  Musculoskeletal: Negative for myalgias.  Skin: Negative for rash.  Neurological:  Positive for dizziness and headaches. Negative for seizures and loss of consciousness.  Psychiatric/Behavioral: Negative for suicidal ideas.  All other systems reviewed and are negative.     ____________________________________________   PHYSICAL EXAM:  VITAL SIGNS: ED Triage Vitals  Enc Vitals Group     BP 02/13/20 1554 (!) 175/66     Pulse Rate 02/13/20 1554 66     Resp 02/13/20 1554 19     Temp 02/13/20 1554 (!) 97.4 F (36.3 C)     Temp Source 02/13/20 1554 Oral     SpO2 02/13/20 1554 100 %     Weight 02/13/20 1556 155 lb (70.3 kg)     Height 02/13/20 1556 $RemoveBefor'5\' 10"'[id number]$  (1.778 m)     Head Circumference --      Peak Flow --      Pain Score 02/13/20 1556 0     Pain Loc --      Pain Edu? --      Excl. in [place]? --    Vitals:   02/13/20 2100 02/13/20 2130  BP: (!) 164/55 (!) 173/70  Pulse: 62 (!) 59  Resp: 15 16  Temp:    SpO2: 100% 96%   Physical Exam Vitals and nursing note reviewed.  Constitutional:      Appearance: He is well-developed and well-nourished.  HENT:     Head: Normocephalic and atraumatic.     Right Ear: External ear normal.     Left Ear: External ear normal.     Mouth/Throat:     Mouth: Mucous membranes are dry.  Eyes:     Conjunctiva/sclera: Conjunctivae normal.  Cardiovascular:     Rate and Rhythm: Normal rate and regular rhythm.     Heart sounds: No murmur heard.   Pulmonary:     Effort: Pulmonary effort is normal. No respiratory distress.     Breath sounds: Normal breath sounds.  Abdominal:     Palpations: Abdomen is soft.     Tenderness: There is no abdominal tenderness.  Musculoskeletal:        General: No edema.     Cervical back: Neck supple.  Skin:    General: Skin is warm and dry.  Neurological:     Mental Status: He is alert.  Psychiatric:        Mood  and Affect: Mood and affect normal.     Cranial nerves II through XII grossly intact.  No pronator drift.  No finger dysmetria.  Symmetric 5/5 strength of all extremities.   Sensation intact to light touch in all extremities.  Unremarkable unassisted gait.  There is no tenderness step-offs or deformities over the C/T/L-spine.  2+ bilateral radial pulses.  No limitation on range of motion effusion or tenderness over the bilateral shoulders, elbows, wrists, knees hips or ankles.  Patient was observed ambulate with steady gait unassisted.  ____________________________________________   LABS (all labs ordered are listed, but only abnormal results are displayed)  Labs Reviewed  BASIC METABOLIC PANEL - Abnormal; Notable for the following components:      Result Value   Sodium 134 (*)    Chloride 97 (*)    Glucose, Bld 334 (*)    BUN 48 (*)    Creatinine, Ser 2.26 (*)    GFR, Estimated 27 (*)    All other components within normal limits  CBC - Abnormal; Notable for the following components:   RBC 3.03 (*)    Hemoglobin 10.6 (*)    HCT 31.3 (*)    MCV 103.3 (*)    MCH 35.0 (*)    All other components within normal limits  URINALYSIS, COMPLETE (UACMP) WITH MICROSCOPIC - Abnormal; Notable for the following components:   Color, Urine YELLOW (*)    APPearance CLEAR (*)    Glucose, UA >=500 (*)    All other components within normal limits  CBG MONITORING, ED - Abnormal; Notable for the following components:   Glucose-Capillary 312 (*)    All other components within normal limits  CBG MONITORING, ED - Abnormal; Notable for the following components:   Glucose-Capillary 180 (*)    All other components within normal limits  CBG MONITORING, ED - Abnormal; Notable for the following components:   Glucose-Capillary 101 (*)    All other components within normal limits  TROPONIN I (HIGH SENSITIVITY) - Abnormal; Notable for the following components:   Troponin I (High Sensitivity) 26 (*)    All other components within normal limits  TROPONIN I (HIGH SENSITIVITY) - Abnormal; Notable for the following components:   Troponin I (High Sensitivity) 29 (*)    All other  components within normal limits  RESP PANEL BY RT-PCR (FLU A&B, COVID) ARPGX2  BRAIN NATRIURETIC PEPTIDE  MAGNESIUM  HEPATIC FUNCTION PANEL  HEMOGLOBIN K0X  BASIC METABOLIC PANEL  CBC   ____________________________________________  EKG  Right bundle branch block, left anterior fascicle block, left axis deviation, prolonged QTc interval at 515 and nonspecific ST changes in the anterior inferior and lateral leads. ____________________________________________  RADIOLOGY  ED MD interpretation: Chest x-ray shows no overt edema, large effusion, focal consolidation, or other acute intrathoracic process.  Changes related to CABG are noted.  CT head shows no evidence of skull fracture or intracranial hemorrhage.  There is cerebral volume loss consistent with microvascular disease.  Official radiology report(s): DG Chest 2 View  Result Date: 02/13/2020 CLINICAL DATA:  Dizziness, fall EXAM: CHEST - 2 VIEW COMPARISON:  Radiograph 01/19/2018 FINDINGS: Postsurgical changes related to prior CABG including intact and aligned sternotomy wires and multiple surgical clips projecting over the mediastinum. Stable cardiomediastinal contours. Inferior vena cava filter seen on lateral radiograph. Some coarse reticular opacities are present in the bilateral basilar periphery, unchanged from comparison and likely reflect some chronic scarring. No focal airspace consolidation or convincing features of edema. No pneumothorax or visible  effusion. No acute osseous or soft tissue abnormality. Degenerative changes are present in the imaged spine and shoulders. IMPRESSION: 1. No acute cardiopulmonary abnormality. 2. No acute traumatic findings in the chest. 3. Stable chronic scarring in the lung bases. 4. Stable postoperative changes related to prior CABG. Electronically Signed   By: Lovena Le M.D.   On: 02/13/2020 18:33   CT HEAD WO CONTRAST  Result Date: 02/13/2020 CLINICAL DATA:  86 year old male with history of  head trauma from a fall. EXAM: CT HEAD WITHOUT CONTRAST TECHNIQUE: Contiguous axial images were obtained from the base of the skull through the vertex without intravenous contrast. COMPARISON:  No priors. FINDINGS: Brain: Severe cerebral and cerebellar atrophy. Patchy and confluent areas of decreased attenuation are noted throughout the deep and periventricular white matter of the cerebral hemispheres bilaterally, compatible with chronic microvascular ischemic disease. No evidence of acute infarction, hemorrhage, hydrocephalus, extra-axial collection or mass lesion/mass effect. Vascular: No hyperdense vessel or unexpected calcification. Skull: Normal. Negative for fracture or focal lesion. Sinuses/Orbits: No acute finding. Other: None. IMPRESSION: 1. No evidence of significant acute traumatic injury to the skull or brain. 2. Severe cerebral and cerebellar atrophy with chronic microvascular ischemic changes in the cerebral white matter, as above. Electronically Signed   By: Vinnie Langton M.D.   On: 02/13/2020 16:43   CT Cervical Spine Wo Contrast  Result Date: 02/13/2020 CLINICAL DATA:  Fall with posterior head strike, denies loss of consciousness EXAM: CT CERVICAL SPINE WITHOUT CONTRAST TECHNIQUE: Multidetector CT imaging of the cervical spine was performed without intravenous contrast. Multiplanar CT image reconstructions were also generated. COMPARISON:  Contemporary CT head FINDINGS: Alignment: Stabilization collar absent at time of examination. Effacement atlantodental interval with associated arthrosis between the anterior arch C1, dens and basion with extensive calcific pannus formation, can be seen in the setting of rheumatoid arthropathy or CPPD arthropathy. Otherwise normal alignment of the craniocervical and atlantoaxial articulations. Straightening of the normal cervical lordosis. Anterolisthesis C3 on 4 of approximately 2 mm, favored to be on a degenerative basis. No evidence of traumatic  listhesis. No abnormally widened, perched or jumped facets. Skull base and vertebrae: No acute skull base fracture. No vertebral body fracture or height loss. The osseous structures appear diffusely demineralized which may limit detection of small or nondisplaced fractures. Bony fusions across the posterior elements/facets C2-3, C4-5. Soft tissues and spinal canal: No pre or paravertebral fluid or swelling. No visible canal hematoma. Large calcific pannus formation at C1-2, posterior to the dens. Disc levels: Multilevel intervertebral disc height loss with spondylitic endplate changes. Larger disc osteophyte complexes present C5-6, C6-7 resulting in mild spinal canal stenosis at these levels. Diffuse uncinate spurring and facet hypertrophic changes are present as well resulting in mild-to-moderate multilevel neural foraminal narrowing with more moderate to severe narrowing bilaterally C6-7. Upper chest: No acute abnormality in the upper chest or imaged lung apices. Biapical pleuroparenchymal scarring. Cervical carotid atherosclerosis. Other: Normal thyroid. IMPRESSION: 1. No acute fracture or traumatic listhesis of the cervical spine. 2. Mild-to-moderate multilevel degenerative changes as described above. 3. Effacement atlantodental interval with extensive calcific pannus formation, can be seen in the setting of rheumatoid arthropathy or CPPD arthropathy. 4. Bony fusions across the posterior elements/facets C2-3, C4-5. 5. Cervical carotid atherosclerosis. Electronically Signed   By: Lovena Le M.D.   On: 02/13/2020 19:26    ____________________________________________   PROCEDURES  Procedure(s) performed (including Critical Care):  .1-3 Lead EKG Interpretation Performed by: Lucrezia Starch, MD Authorized by: Hulan Saas  P, MD     Interpretation: abnormal     ECG rate assessment: normal     Rhythm: sinus rhythm     Ectopy: none     Conduction: abnormal        ____________________________________________   INITIAL IMPRESSION / ASSESSMENT AND PLAN / ED COURSE      Patient presents with above-stated history exam for assessment of several weeks of worsening lightheadedness as well as a fall that occurred last night.  He is slightly hypertensive otherwise stable vital signs on arrival.    Differential includes but is not limited to ACS, PE, arrhythmia, metabolic derangements, acute anemia, symptomatic carotid stenosis, dehydration, cardiomyopathy, and CVA.  No significant injuries identified on exam and CT head and C-spine as well as chest x-ray shows no evidence of fracture or intracranial bleeding.  No suspicion for occult visceral injury at this time.  BMP remarkable for glucose of three thirty-four and a creatinine of 2.26 compared to 2.028 months ago.  No evidence of acidosis or other significant electrolyte or metabolic derangements.  No evidence of DKA.  CBC with a hemoglobin of 10.6 compared to 10.5 2 days ago compared to 10.2 3 months ago.  Low suspicion for acute symptomatic anemia at this time.  BNP is sixty-four and overall I have low suspicion for CHF contributing to presentation.  Hepatic function panel is unremarkable.  EKG discharge new prolonged QTc interval and troponin is slightly elevated as noted above.  Suspect this is related to some mild demand ischemia in the setting of dehydration patient was also given ASA.  I will plan to obtain a 2nd 2-hour troponin.  I did discuss patient's presentation and work-up with on-call cardiologist Dr. Lovena Le given concern for possible arrhythmia versus other cardiac etiologies contributing to patient's dizziness.  Dr. Lovena Le recommended observation admission on telemetry and stated someone from the cardiology team to see the patient in the morning.   Patient given IV fluids and insulin in the ED for some hyperglycemia and mild dehydration.  I will plan to admit to hospital service for further  evaluation management.      ____________________________________________   FINAL CLINICAL IMPRESSION(S) / ED DIAGNOSES  Final diagnoses:  Fall, initial encounter  Dizziness  Hyperglycemia    Medications  insulin aspart (novoLOG) injection 0-15 Units (0 Units Subcutaneous Hold 02/13/20 1919)  sodium chloride flush (NS) 0.9 % injection 3 mL (3 mLs Intravenous Not Given 02/13/20 2141)  insulin aspart (novoLOG) injection 0-9 Units (has no administration in time range)  insulin aspart (novoLOG) injection 0-5 Units (0 Units Subcutaneous Not Given 02/13/20 2140)  enoxaparin (LOVENOX) injection 30 mg (has no administration in time range)  0.9 %  sodium chloride infusion ( Intravenous New Bag/Given 02/13/20 2014)  acetaminophen (TYLENOL) tablet 650 mg (has no administration in time range)    Or  acetaminophen (TYLENOL) suppository 650 mg (has no administration in time range)  ondansetron (ZOFRAN) tablet 4 mg (has no administration in time range)    Or  ondansetron (ZOFRAN) injection 4 mg (has no administration in time range)  lactated ringers bolus 500 mL (0 mLs Intravenous Stopped 02/13/20 2010)  aspirin chewable tablet 324 mg (324 mg Oral Given 02/13/20 1925)     ED Discharge Orders    None       Note:  This document was prepared using Dragon voice recognition software and may include unintentional dictation errors.   Lucrezia Starch, MD 02/13/20 [phone]

## 2020-02-13 NOTE — ED Notes (Signed)
Pt given juice, graham crackers per request

## 2020-02-13 NOTE — H&P (Signed)
History and Physical    Armen Waring WJX:[id number] DOB: Jun 20, 1926 DOA: 02/13/2020  PCP: Pleas Koch, NP   Patient coming from: Home  I have personally briefly reviewed patient's old medical records in [place]  Chief Complaint: Lightheadedness, fall  HPI: Tommy [name] is a 86 y.o. male with medical history significant for CAD status post CABG, CKD 3B, carotid artery stenosis status post endarterectomy, PAD, anemia of chronic disease, diabetes, hypothyroidism, trifascicular block, seen by his cardiologist, Dr. Saunders Revel on 11/17 with complaints of lightheadedness, who presents to the emergency room with a 4-week complaint of continued lightheadedness, resulting in a fall,hitting the back of his head but not losing consciousness.this was his second fall in two weeks. He denies chest pain, nausea vomiting or diaphoresis, denies shortness of breath, pain or swelling in the legs.  Denies headache, visual changes or one-sided weakness numbness or tingling.  Denies abdominal pain or change in bowel habits. ED Course: On arrival, afebrile, BP 175/66, pulse 66, respiration 19 with O2 sat 100% on room air.  Orthostatics negative.  Blood work significant for hemoglobin 10.6 which is baseline, WBC normal at 7400.  Creatinine 2.27 above baseline of 1.6, hyperglycemia of 334.  LFTs WNL, magnesium normal at 2.3.  Troponin slightly elevated at 26 EKG as reviewed by me : RBBB and hemifascicular block, rate 66 Imaging: Chest x-ray with no acute findings..  CT head and C-spine no acute trauma The emergency room provider spoke with cardiologist, Dr. Lovena Le recommended bringing in patient for observation and consult.  Hospitalist consulted for admission.  Review of Systems: As per HPI otherwise all other systems on review of systems negative.    Past Medical History:  Diagnosis Date  . Anemia   . Carotid artery occlusion    a. 2004 s/p L carotid endarterectomy; b. 02/2019 U/S: 1-39% bilat  ICA stenosis. <50% bilat CCA stenosis.  . CKD (chronic kidney disease) stage 3, GFR 30-59 ml/min (HCC)   . Coronary artery disease    a. 1994 s/p CABG x 3, [place], [place] (LIMA->LAD, VG->dRCA, VG->unknown vessel); b. 02/2016 Cath: LM 99d, LAD 100ost, LCX 100ost, RCA 80ost, diffuse 85-21m. LIMA->LAD ok, VG->RCA ok. VG->unknown vessel 100.  . Diabetes mellitus without complication ([place])   . Dyslipidemia   . GERD (gastroesophageal reflux disease)   . Hemorrhoids   . 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 History of echocardiogram    a. 02/2017 Echo: EF 55-65%, no rwma. Nl DD. Ao sclerosis w/o stenosis. Mod TR. Mild MR.  Marland Kitchen Hypertension   . Pulmonary embolism ([place])   . Thyroid disease     Past Surgical History:  Procedure Laterality Date  . CAROTID ENDARTERECTOMY Left 2004  . CORONARY ARTERY BYPASS GRAFT  1994   [name]  . TONSILLECTOMY AND ADENOIDECTOMY       reports that he quit smoking about 43 years ago. His smoking use included cigarettes. He has a 37.50 pack-year smoking history. He has never used smokeless tobacco. He reports current alcohol use of about 1.0 standard drink of alcohol per week. He reports that he does not use drugs.  Allergies  Allergen Reactions  . Heparin Other (See Comments)  . Patiromer Other (See Comments)  . Sodium Zirconium Cyclosilicate Other (See Comments)    Family History  Problem Relation Age of Onset  . Heart attack Mother   . Heart disease Mother   . Stroke Mother   . Diabetes Father   . Coronary artery disease  Sister       Prior to Admission medications   Medication Sig Start Date End Date Taking? Authorizing Provider  aspirin 81 MG tablet Take 81 mg by mouth every evening.     [provider]  atorvastatin (LIPITOR) 40 MG tablet TAKE 1 TABLET BY MOUTH  DAILY 11/01/19   Pleas Koch, NP  Bacillus Coagulans-Inulin (ALIGN PREBIOTIC-PROBIOTIC PO) Take by mouth.    [provider]  betamethasone  valerate (VALISONE) 0.1 % cream APPLY TO AFFECTED AREA TWICE A DAY 02/04/20   Pleas Koch, NP  Blood Glucose Monitoring Suppl (ONETOUCH VERIO) w/Device KIT 1 Device by Does not apply route daily. Use as instructed to test blood sugar daily 04/22/17   Pleas Koch, NP  CALCIUM PO Take 600 mg by mouth every morning.     [provider]  cholecalciferol (VITAMIN D3) 25 MCG (1000 UT) tablet Take 1,000 Units by mouth daily.    [provider]  epoetin alfa (EPOGEN,PROCRIT) 32951 UNIT/ML injection 10,000 Units. Every 2 to 4 weeks    [provider]  furosemide (LASIX) 40 MG tablet TAKE 1 TABLET BY MOUTH  DAILY 09/07/19   Pleas Koch, NP  glimepiride (AMARYL) 4 MG tablet TAKE 1 TABLET BY MOUTH  DAILY WITH BREAKFAST 01/04/20   Pleas Koch, NP  Hydrocortisone (PROCTOSOL HC RE) Place rectally as needed.    [provider]  Insulin Glargine (LANTUS) 100 UNIT/ML Solostar Pen Inject 6 Units into the skin daily.    [provider]  Insulin Pen Needle 29G X 12.7MM MISC BD Ultrafine Pen Needle Use as instructed to inject insulin daily 02/18/17   Pleas Koch, NP  IRON PO Take by mouth. 2 QAM 1 QPM    [provider]  Lancets (ONETOUCH DELICA PLUS [id number]) [place] USE AS INSTRUCT TO TEST BLOOD SUGAR ONCE DAILY 07/05/19   Pleas Koch, NP  levothyroxine (SYNTHROID) 175 MCG tablet TAKE 1 TABLET BY MOUTH IN  THE MORNING ON AN EMPTY  STOMACH WITH A FULL GLASS  OF WATER 01/04/20   Pleas Koch, NP  meclizine (ANTIVERT) 12.5 MG tablet Take 1 tablet (12.5 mg total) by mouth 3 (three) times daily as needed for dizziness. 02/10/20   Pleas Koch, NP  Multiple Vitamin (MULTIVITAMIN) capsule Take 1 capsule by mouth daily.    [provider]  nitroGLYCERIN (NITROSTAT) 0.4 MG SL tablet Place 1 tablet (0.4 mg total) under the tongue every 5 (five) minutes as needed for chest pain. 02/19/19 06/23/19  Theora Gianotti, NP   Nutritional Supplements (JUICE PLUS FIBRE PO) Take by mouth.    [provider]  Omega-3 Fatty Acids (FISH OIL PO) Take 1,200 mg by mouth 4 (four) times daily.    [provider]  [facility] VERIO test strip USE AS INSTRUCTED TO TEST BLOOD SUGAR ONCE DAILY 07/26/19   Pleas Koch, NP  pantoprazole (PROTONIX) 20 MG tablet TAKE 1 TABLET BY MOUTH  DAILY 11/12/19   Pleas Koch, NP  ranolazine (RANEXA) 1000 MG SR tablet Take 1 tablet (1,000 mg total) by mouth 2 (two) times daily. 02/19/19   Theora Gianotti, NP  timolol (TIMOPTIC) 0.5 % ophthalmic solution APPLY 1 DROP(S) IN BOTH EYES ONCE A DAY 03/17/17   [provider]    Physical Exam: Vitals:   02/13/20 1556 02/13/20 1830 02/13/20 1844 02/13/20 2000  BP:  (!) 168/54 (!) 168/74 (!) 194/70  Pulse:  60  64 62  Resp:   18 19  Temp:   98.2 F (36.8 C)   TempSrc:   Oral   SpO2:  99% 99% 100%  Weight: 70.3 kg     Height: $Remove'5\' 10"'lxqTnHc$  (1.778 m)        Vitals:   02/13/20 1556 02/13/20 1830 02/13/20 1844 02/13/20 2000  BP:  (!) 168/54 (!) 168/74 (!) 194/70  Pulse:  60 64 62  Resp:   18 19  Temp:   98.2 F (36.8 C)   TempSrc:   Oral   SpO2:  99% 99% 100%  Weight: 70.3 kg     Height: $Remove'5\' 10"'UtwMaWc$  (1.778 m)         Constitutional: Alert and oriented x 3 . Not in any apparent distress HEENT:      Head: Normocephalic and atraumatic.         Eyes: PERLA, EOMI, Conjunctivae are normal. Sclera is non-icteric.       Mouth/Throat: Mucous membranes are moist.       Neck: Supple with no signs of meningismus. Cardiovascular: Regular rate and rhythm. No murmurs, gallops, or rubs. 2+ symmetrical distal pulses are present . No JVD. No LE edema Respiratory: Respiratory effort normal .Lungs sounds clear bilaterally. No wheezes, crackles, or rhonchi.  Gastrointestinal: Soft, non tender, and non distended with positive bowel sounds.  Genitourinary: No CVA tenderness. Musculoskeletal: Nontender with normal range of  motion in all extremities. No cyanosis, or erythema of extremities. Neurologic:  Face is symmetric. Moving all extremities. No gross focal neurologic deficits . Skin: Skin is warm, dry.  No rash or ulcers Psychiatric: Mood and affect are normal    Labs on Admission: I have personally reviewed following labs and imaging studies  CBC: Recent Labs  Lab 02/11/20 1242 02/13/20 1557  WBC 5.7 7.4  NEUTROABS 3.9  --   HGB 10.5* 10.6*  HCT 30.6* 31.3*  MCV 102.7* 103.3*  PLT 178 619   Basic Metabolic Panel: Recent Labs  Lab 02/13/20 1557  NA 134*  K 5.1  CL 97*  CO2 29  GLUCOSE 334*  BUN 48*  CREATININE 2.26*  CALCIUM 9.7  MG 2.3   GFR: Estimated Creatinine Clearance: 21.2 mL/min (A) (by C-G formula based on SCr of 2.26 mg/dL (H)). Liver Function Tests: Recent Labs  Lab 02/13/20 1557  AST 29  ALT 23  ALKPHOS 76  BILITOT 0.8  PROT 7.6  ALBUMIN 3.6   No results for input(s): LIPASE, AMYLASE in the last 168 hours. No results for input(s): AMMONIA in the last 168 hours. Coagulation Profile: No results for input(s): INR, PROTIME in the last 168 hours. Cardiac Enzymes: No results for input(s): CKTOTAL, CKMB, CKMBINDEX, TROPONINI in the last 168 hours. BNP (last 3 results) No results for input(s): PROBNP in the last 8760 hours. HbA1C: No results for input(s): HGBA1C in the last 72 hours. CBG: Recent Labs  Lab 02/13/20 1601 02/13/20 1915  GLUCAP 312* 180*   Lipid Profile: No results for input(s): CHOL, HDL, LDLCALC, TRIG, CHOLHDL, LDLDIRECT in the last 72 hours. Thyroid Function Tests: No results for input(s): TSH, T4TOTAL, FREET4, T3FREE, THYROIDAB in the last 72 hours. Anemia Panel: No results for input(s): VITAMINB12, FOLATE, FERRITIN, TIBC, IRON, RETICCTPCT in the last 72 hours. Urine analysis:    Component Value Date/Time   BILIRUBINUR negative 06/08/2019 1746   KETONESUR negative 06/08/2019 1746   PROTEINUR negative 06/08/2019 1746   UROBILINOGEN 0.2  06/08/2019 1746   NITRITE Negative 06/08/2019  [place] Negative 06/08/2019 1746    Radiological Exams on Admission: DG Chest 2 View  Result Date: 02/13/2020 CLINICAL DATA:  Dizziness, fall EXAM: CHEST - 2 VIEW COMPARISON:  Radiograph 01/19/2018 FINDINGS: Postsurgical changes related to prior CABG including intact and aligned sternotomy wires and multiple surgical clips projecting over the mediastinum. Stable cardiomediastinal contours. Inferior vena cava filter seen on lateral radiograph. Some coarse reticular opacities are present in the bilateral basilar periphery, unchanged from comparison and likely reflect some chronic scarring. No focal airspace consolidation or convincing features of edema. No pneumothorax or visible effusion. No acute osseous or soft tissue abnormality. Degenerative changes are present in the imaged spine and shoulders. IMPRESSION: 1. No acute cardiopulmonary abnormality. 2. No acute traumatic findings in the chest. 3. Stable chronic scarring in the lung bases. 4. Stable postoperative changes related to prior CABG. Electronically Signed   By: Lovena Le M.D.   On: 02/13/2020 18:33   CT HEAD WO CONTRAST  Result Date: 02/13/2020 CLINICAL DATA:  86 year old male with history of head trauma from a fall. EXAM: CT HEAD WITHOUT CONTRAST TECHNIQUE: Contiguous axial images were obtained from the base of the skull through the vertex without intravenous contrast. COMPARISON:  No priors. FINDINGS: Brain: Severe cerebral and cerebellar atrophy. Patchy and confluent areas of decreased attenuation are noted throughout the deep and periventricular white matter of the cerebral hemispheres bilaterally, compatible with chronic microvascular ischemic disease. No evidence of acute infarction, hemorrhage, hydrocephalus, extra-axial collection or mass lesion/mass effect. Vascular: No hyperdense vessel or unexpected calcification. Skull: Normal. Negative for fracture or focal lesion.  Sinuses/Orbits: No acute finding. Other: None. IMPRESSION: 1. No evidence of significant acute traumatic injury to the skull or brain. 2. Severe cerebral and cerebellar atrophy with chronic microvascular ischemic changes in the cerebral white matter, as above. Electronically Signed   By: Vinnie Langton M.D.   On: 02/13/2020 16:43   CT Cervical Spine Wo Contrast  Result Date: 02/13/2020 CLINICAL DATA:  Fall with posterior head strike, denies loss of consciousness EXAM: CT CERVICAL SPINE WITHOUT CONTRAST TECHNIQUE: Multidetector CT imaging of the cervical spine was performed without intravenous contrast. Multiplanar CT image reconstructions were also generated. COMPARISON:  Contemporary CT head FINDINGS: Alignment: Stabilization collar absent at time of examination. Effacement atlantodental interval with associated arthrosis between the anterior arch C1, dens and basion with extensive calcific pannus formation, can be seen in the setting of rheumatoid arthropathy or CPPD arthropathy. Otherwise normal alignment of the craniocervical and atlantoaxial articulations. Straightening of the normal cervical lordosis. Anterolisthesis C3 on 4 of approximately 2 mm, favored to be on a degenerative basis. No evidence of traumatic listhesis. No abnormally widened, perched or jumped facets. Skull base and vertebrae: No acute skull base fracture. No vertebral body fracture or height loss. The osseous structures appear diffusely demineralized which may limit detection of small or nondisplaced fractures. Bony fusions across the posterior elements/facets C2-3, C4-5. Soft tissues and spinal canal: No pre or paravertebral fluid or swelling. No visible canal hematoma. Large calcific pannus formation at C1-2, posterior to the dens. Disc levels: Multilevel intervertebral disc height loss with spondylitic endplate changes. Larger disc osteophyte complexes present C5-6, C6-7 resulting in mild spinal canal stenosis at these levels.  Diffuse uncinate spurring and facet hypertrophic changes are present as well resulting in mild-to-moderate multilevel neural foraminal narrowing with more moderate to severe narrowing bilaterally C6-7. Upper chest: No acute abnormality in the upper chest or imaged lung apices. Biapical pleuroparenchymal scarring. Cervical  carotid atherosclerosis. Other: Normal thyroid. IMPRESSION: 1. No acute fracture or traumatic listhesis of the cervical spine. 2. Mild-to-moderate multilevel degenerative changes as described above. 3. Effacement atlantodental interval with extensive calcific pannus formation, can be seen in the setting of rheumatoid arthropathy or CPPD arthropathy. 4. Bony fusions across the posterior elements/facets C2-3, C4-5. 5. Cervical carotid atherosclerosis. Electronically Signed   By: Lovena Le M.D.   On: 02/13/2020 19:26     Assessment/Plan 86 year old male with history of CAD status post CABG, CKD 3B, carotid artery stenosis status post endarterectomy, PAD, anemia of chronic disease, diabetes, hypothyroidism, trifascicular block, seen by his cardiologist, Dr. Saunders Revel on 11/17 with complaints of lightheadedness, who presents to the emergency room with a 4-week complaint of continued lightheadedness, resulting in a fall without injury    Postural dizziness with presyncope and fall   Trifascicular block -Patient with history of trifascicular block 3-week complaint of dizziness resulting in fall. Had another fall two weeks ago -CT head and C-spine with no evidence of injury -Concerning for cardiac etiology.  Also mild dehydration given AKI - Had an event monitor earlier in the year which showed no evidence of second or third-degree AV block, per chart review -Syncope work-up:  continuous cardiac monitoring, echo, carotid doppler -Cardiology consult -Avoid all rate control agents    Acute kidney injury superimposed on CKD 3b ([place]) -Creatinine 2.26 up from baseline of 1.6 -IV  hydration -Monitor renal function avoid nephrotoxins    Elevated troponin   Coronary artery disease of native artery of native heart -Troponin slightly elevated at 26 with patient denies chest pain  -Continue to trend troponins -Cardiology consult as above  Type 2 diabetes mellitus (HCC) -Sliding scale insulin    Hypothyroidism -Continue levothyroxine    Carotid artery disease ([place])   H/O endarterectomy -Continue antiplatelet and statin -Follow carotid Doppler    Anemia of chronic disease -Hemoglobin 10.6 which is baseline    PAD (peripheral artery disease) (HCC) -Continue antiplatelet and statin    DVT prophylaxis: SCD  Code Status: DNR  Family Communication:  Son, Shanon Brow at bedside  Disposition Plan: Back to previous home environment Consults called: Cardiology Status: Observation.     Athena Masse MD [facility]     02/13/2020, 8:09 PM

## 2020-02-13 NOTE — ED Triage Notes (Signed)
Pt reports has been dizzy for the past 2 weeks but since yesterday it has increased, had a fall last night hitting the back of his head. Denies loss of consciousness. Pt talks in complete sentences no respiratory distress noted .

## 2020-02-13 NOTE — ED Notes (Addendum)
Pt ambulatory to restroom with steady gait. No assistance needed

## 2020-02-13 NOTE — ED Notes (Signed)
Pt denies dizziness at this time. Pt states he only was feeling dizzy yesterday mostly when he was standing up.

## 2020-02-14 ENCOUNTER — Other Ambulatory Visit: Payer: Self-pay

## 2020-02-14 ENCOUNTER — Observation Stay
Admit: 2020-02-14 | Discharge: 2020-02-14 | Disposition: A | Discharge status: Home / Self Care | Payer: Medicare Other | Attending: Internal Medicine | Admitting: Internal Medicine

## 2020-02-14 DIAGNOSIS — W19XXXA Unspecified fall, initial encounter: Secondary | ICD-10-CM | POA: Diagnosis present

## 2020-02-14 DIAGNOSIS — I6523 Occlusion and stenosis of bilateral carotid arteries: Secondary | ICD-10-CM | POA: Diagnosis present

## 2020-02-14 DIAGNOSIS — E1122 Type 2 diabetes mellitus with diabetic chronic kidney disease: Secondary | ICD-10-CM | POA: Diagnosis present

## 2020-02-14 DIAGNOSIS — Z20822 Contact with and (suspected) exposure to covid-19: Secondary | ICD-10-CM | POA: Diagnosis present

## 2020-02-14 DIAGNOSIS — D75829 Heparin-induced thrombocytopenia, unspecified: Secondary | ICD-10-CM

## 2020-02-14 DIAGNOSIS — I25118 Atherosclerotic heart disease of native coronary artery with other forms of angina pectoris: Secondary | ICD-10-CM | POA: Diagnosis present

## 2020-02-14 DIAGNOSIS — D638 Anemia in other chronic diseases classified elsewhere: Secondary | ICD-10-CM | POA: Diagnosis present

## 2020-02-14 DIAGNOSIS — N1832 Chronic kidney disease, stage 3b: Secondary | ICD-10-CM

## 2020-02-14 DIAGNOSIS — Y929 Unspecified place or not applicable: Secondary | ICD-10-CM | POA: Diagnosis not present

## 2020-02-14 DIAGNOSIS — Z79899 Other long term (current) drug therapy: Secondary | ICD-10-CM | POA: Diagnosis not present

## 2020-02-14 DIAGNOSIS — D7582 Heparin induced thrombocytopenia (HIT): Secondary | ICD-10-CM

## 2020-02-14 DIAGNOSIS — E1165 Type 2 diabetes mellitus with hyperglycemia: Secondary | ICD-10-CM | POA: Diagnosis present

## 2020-02-14 DIAGNOSIS — I453 Trifascicular block: Secondary | ICD-10-CM | POA: Diagnosis present

## 2020-02-14 DIAGNOSIS — E1151 Type 2 diabetes mellitus with diabetic peripheral angiopathy without gangrene: Secondary | ICD-10-CM | POA: Diagnosis present

## 2020-02-14 DIAGNOSIS — I2510 Atherosclerotic heart disease of native coronary artery without angina pectoris: Secondary | ICD-10-CM | POA: Diagnosis not present

## 2020-02-14 DIAGNOSIS — E860 Dehydration: Secondary | ICD-10-CM | POA: Diagnosis present

## 2020-02-14 DIAGNOSIS — Z87891 Personal history of nicotine dependence: Secondary | ICD-10-CM | POA: Diagnosis not present

## 2020-02-14 DIAGNOSIS — K219 Gastro-esophageal reflux disease without esophagitis: Secondary | ICD-10-CM | POA: Diagnosis present

## 2020-02-14 DIAGNOSIS — E875 Hyperkalemia: Secondary | ICD-10-CM | POA: Diagnosis not present

## 2020-02-14 DIAGNOSIS — R55 Syncope and collapse: Secondary | ICD-10-CM | POA: Diagnosis not present

## 2020-02-14 DIAGNOSIS — E785 Hyperlipidemia, unspecified: Secondary | ICD-10-CM | POA: Diagnosis present

## 2020-02-14 DIAGNOSIS — R42 Dizziness and giddiness: Secondary | ICD-10-CM

## 2020-02-14 DIAGNOSIS — Z66 Do not resuscitate: Secondary | ICD-10-CM | POA: Diagnosis present

## 2020-02-14 DIAGNOSIS — I129 Hypertensive chronic kidney disease with stage 1 through stage 4 chronic kidney disease, or unspecified chronic kidney disease: Secondary | ICD-10-CM | POA: Diagnosis present

## 2020-02-14 DIAGNOSIS — E039 Hypothyroidism, unspecified: Secondary | ICD-10-CM | POA: Diagnosis present

## 2020-02-14 DIAGNOSIS — Z888 Allergy status to other drugs, medicaments and biological substances status: Secondary | ICD-10-CM | POA: Diagnosis not present

## 2020-02-14 DIAGNOSIS — Z7982 Long term (current) use of aspirin: Secondary | ICD-10-CM | POA: Diagnosis not present

## 2020-02-14 DIAGNOSIS — I10 Essential (primary) hypertension: Secondary | ICD-10-CM

## 2020-02-14 DIAGNOSIS — Z86711 Personal history of pulmonary embolism: Secondary | ICD-10-CM | POA: Diagnosis not present

## 2020-02-14 DIAGNOSIS — N179 Acute kidney failure, unspecified: Secondary | ICD-10-CM | POA: Diagnosis present

## 2020-02-14 DIAGNOSIS — Z951 Presence of aortocoronary bypass graft: Secondary | ICD-10-CM | POA: Diagnosis not present

## 2020-02-14 LAB — TSH: TSH: 1.639 u[IU]/mL (ref 0.350–4.500)

## 2020-02-14 LAB — BASIC METABOLIC PANEL
Anion gap: 8 (ref 5–15)
BUN: 39 mg/dL — ABNORMAL HIGH (ref 8–23)
CO2: 26 mmol/L (ref 22–32)
Calcium: 8.9 mg/dL (ref 8.9–10.3)
Chloride: 102 mmol/L (ref 98–111)
Creatinine, Ser: 1.73 mg/dL — ABNORMAL HIGH (ref 0.61–1.24)
GFR, Estimated: 37 mL/min — ABNORMAL LOW (ref >60)
Glucose, Bld: 130 mg/dL — ABNORMAL HIGH (ref 70–99)
Potassium: 4.1 mmol/L (ref 3.5–5.1)
Sodium: 136 mmol/L (ref 135–145)

## 2020-02-14 LAB — ECHOCARDIOGRAM COMPLETE
AR max vel: 3.07 cm2
AV Area VTI: 3.61 cm2
AV Area mean vel: 3.38 cm2
AV Mean grad: 3 mmHg
AV Peak grad: 6.1 mmHg
Ao pk vel: 1.23 m/s
Area-P 1/2: 3.07 cm2
Height: 70 in
S' Lateral: 2.16 cm
Weight: 2480 oz

## 2020-02-14 LAB — HEMOGLOBIN A1C
Hgb A1c MFr Bld: 6.8 % — ABNORMAL HIGH (ref 4.8–5.6)
Mean Plasma Glucose: 148.46 mg/dL

## 2020-02-14 LAB — CBC
HCT: 27.1 % — ABNORMAL LOW (ref 39.0–52.0)
Hemoglobin: 9.4 g/dL — ABNORMAL LOW (ref 13.0–17.0)
MCH: 35.1 pg — ABNORMAL HIGH (ref 26.0–34.0)
MCHC: 34.7 g/dL (ref 30.0–36.0)
MCV: 101.1 fL — ABNORMAL HIGH (ref 80.0–100.0)
Platelets: 163 10*3/uL (ref 150–400)
RBC: 2.68 MIL/uL — ABNORMAL LOW (ref 4.22–5.81)
RDW: 12 % (ref 11.5–15.5)
WBC: 7 10*3/uL (ref 4.0–10.5)
nRBC: 0 % (ref 0.0–0.2)

## 2020-02-14 LAB — CBG MONITORING, ED
Glucose-Capillary: 116 mg/dL — ABNORMAL HIGH (ref 70–99)
Glucose-Capillary: 80 mg/dL (ref 70–99)
Glucose-Capillary: 209 mg/dL — ABNORMAL HIGH (ref 70–99)
Glucose-Capillary: 140 mg/dL — ABNORMAL HIGH (ref 70–99)
Glucose-Capillary: 168 mg/dL — ABNORMAL HIGH (ref 70–99)

## 2020-02-14 MED ORDER — ASPIRIN EC 81 MG PO TBEC
81.0000 mg | DELAYED_RELEASE_TABLET | Freq: Every day | ORAL | Status: DC
  Administered 2020-02-14: 81 mg via ORAL
  Filled 2020-02-14 (×2): qty 1

## 2020-02-14 MED ORDER — ALIGN PREBIOTIC-PROBIOTIC 5-1.25 MG-GM PO CHEW: CHEWABLE_TABLET | Freq: Every day | ORAL | Status: DC

## 2020-02-14 MED ORDER — NITROGLYCERIN 0.4 MG SL SUBL: 0.4000 mg | SUBLINGUAL_TABLET | SUBLINGUAL | Status: DC | PRN

## 2020-02-14 MED ORDER — PANTOPRAZOLE SODIUM 20 MG PO TBEC
20.0000 mg | DELAYED_RELEASE_TABLET | Freq: Every day | ORAL | Status: DC
  Administered 2020-02-15: 20 mg via ORAL
  Filled 2020-02-14 (×2): qty 1

## 2020-02-14 MED ORDER — RANOLAZINE ER 500 MG PO TB12: 1000.0000 mg | ORAL_TABLET | Freq: Two times a day (BID) | ORAL | Status: DC

## 2020-02-14 MED ORDER — TIMOLOL MALEATE 0.5 % OP SOLN: 1.0000 [drp] | Freq: Every day | OPHTHALMIC | Status: DC

## 2020-02-14 MED ORDER — LEVOTHYROXINE SODIUM 50 MCG PO TABS
175.0000 ug | ORAL_TABLET | Freq: Every day | ORAL | Status: DC
  Administered 2020-02-15: 175 ug via ORAL
  Filled 2020-02-14 (×2): qty 1

## 2020-02-14 MED ORDER — RISAQUAD PO CAPS
1.0000 | ORAL_CAPSULE | Freq: Every day | ORAL | Status: DC
  Administered 2020-02-14 – 2020-02-15 (×2): 1 via ORAL
  Filled 2020-02-14 (×2): qty 1

## 2020-02-14 MED ORDER — TIMOLOL MALEATE 0.5 % OP SOLN
1.0000 [drp] | Freq: Every day | OPHTHALMIC | Status: DC
  Administered 2020-02-15: 1 [drp] via OPHTHALMIC
  Filled 2020-02-14: qty 5

## 2020-02-14 MED ORDER — ATORVASTATIN CALCIUM 20 MG PO TABS
40.0000 mg | ORAL_TABLET | Freq: Every day | ORAL | Status: DC
  Administered 2020-02-14: 40 mg via ORAL
  Filled 2020-02-14: qty 2

## 2020-02-14 NOTE — Consult Note (Signed)
Cardiology Consultation:   Patient ID: Tommy [name]; [id number]; [birth year]   Admit date: 02/13/2020 Date of Consult: 02/14/2020  Primary Care Provider: Pleas Koch, NP Primary Cardiologist: End Primary Electrophysiologist:  None   Patient Profile:   Zahmir Lalla is a 86 y.o. male with a hx of CAD status post remote three-vessel CABG with stable angina, PAD, carotid artery stenosis status post left-sided CEA in 2004, known trifascicular block, AAA, DM2, CKD stage III, HTN, HLD, and anemia chronic disease who is being seen today for the evaluation of trifascicular block at the request of Dr. Damita Dunnings.  History of Present Illness:   Mr. Franchino underwent CABG in 1994 with LIMA to LAD, SVG to distal RCA, and SVG to other unknown vessel.  Most recent cath from 02/2016 showed severe native disease with chronic total occlusions of the ostial LAD and LCx as well as 99% stenosis of the distal left main.  There was 80% ostial RCA stenosis as well as diffuse mid RCA with tandem lesions of 85 to 99%.  Patent LIMA to LAD and SVG to distal RCA.  Additional SVG to unknown vessel was noted to be chronically occluded.  More recently, he was seen in the spring 2021 with dizziness and generalized weakness.  His blood sugars were noted to be borderline low.  He was evaluated by cardiology for these symptoms, and due to intermittent trifascicular block he underwent outpatient cardiac monitoring showing a predominant rhythm of sinus with an average heart rate of 59 bpm (range 44-86 in sinus), rare PACs and PVCs, 2 atrial runs were noted lasting up to 8 beats with a maximum rate of 102 bpm, no sustained arrhythmias, high-grade AV block, or prolonged pauses were noted.  In the setting carvedilol was decreased to 3.125 mg twice daily.  He was last seen in the office in 01/2020 noting exertional dyspnea on a regular basis with mild activity without episodes of frank chest pain.  Despite this, he did  continue to exercise on a regular basis.  His EKG continued to show a known trifascicular block without any significant interval changes.  In the setting of his advanced age and underlying CKD additional testing was deferred for his dyspnea.  With regards to his trifascicular block, his PR interval was minimally longer leading to the discontinuation of carvedilol altogether.  It was not felt his symptoms of fatigue and lightheadedness were related to underlying conduction disease as prior event monitoring showed no evidence of second or third-degree AV block.  He notes his dizziness, lightheadedness and fatigue have been present for 6-7 months, though over the past several weeks, these symptoms have been getting worse. He has also been sleeping a lot more. He denies and chest pain or dyspnea. No palpitations.   He presented to the [facility] ED on 12/12 with a 2 to 4-week history of increased dizziness and fatigue/increased somnolence. He has suffered 2 falls over the past 3 weeks that he attributes to dizziness, most recently on 02/13/2020, hitting the back of his head without LOC.   Upon his arrival to [facility], BP was elevated into the 459X to 774F systolic though is currently improved into the 423T systolic. CT head/C-spine not acute. CXR not acute. Carotid artery ultrasound showed RICA 53-20% stenosis, LICA s/p CEA without hemodynamically significant stenosis, EKG was overall poor quality with significant underlying artifact/baseline wandering though does appear to show sinus rhythm, 66 bpm, trifascicular block without acute st/t changes. HS-Tn 26 with a  delta of 29, BNP normal, covid and influenza negative, TSH pending, potassium 5.1-->4.1, magnesium 2.3, BUN/SCr 48/2.26/39/1.73, HGB 10.6, LFT normal. ED note indicates the case was discussed with EP while in the ED with recommendation to observe. He has received NS and LR as well as ASA 324 mg x 1 and insulin. Currently, he is asymptomatic and at his baseline.      Past Medical History:  Diagnosis Date  . Anemia   . Carotid artery occlusion    a. 2004 s/p L carotid endarterectomy; b. 02/2019 U/S: 1-39% bilat ICA stenosis. <50% bilat CCA stenosis.  . CKD (chronic kidney disease) stage 3, GFR 30-59 ml/min (HCC)   . Coronary artery disease    a. 1994 s/p CABG x 3, [place], [place] (LIMA->LAD, VG->dRCA, VG->unknown vessel); b. 02/2016 Cath: LM 99d, LAD 100ost, LCX 100ost, RCA 80ost, diffuse 85-88m LIMA->LAD ok, VG->RCA ok. VG->unknown vessel 100.  . Diabetes mellitus without complication ([place]   . Dyslipidemia   . GERD (gastroesophageal reflux disease)   . Hemorrhoids   . 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 History of echocardiogram    a. 02/2017 Echo: EF 55-65%, no rwma. Nl DD. Ao sclerosis w/o stenosis. Mod TR. Mild MR.  .Marland KitchenHypertension   . Pulmonary embolism ([place]   . Thyroid disease     Past Surgical History:  Procedure Laterality Date  . CAROTID ENDARTERECTOMY Left 2004  . CORONARY ARTERY BYPASS GRAFT  1994   [name]  . TONSILLECTOMY AND ADENOIDECTOMY       Home Meds: Prior to Admission medications   Medication Sig Start Date End Date Taking? Authorizing Provider  aspirin 81 MG tablet Take 81 mg by mouth at bedtime.   Yes [provider]  atorvastatin (LIPITOR) 40 MG tablet TAKE 1 TABLET BY MOUTH  DAILY Patient taking differently: Take 40 mg by mouth at bedtime. 11/01/19  Yes CPleas Koch NP  Bacillus Coagulans-Inulin (ALIGN PREBIOTIC-PROBIOTIC PO) Take 1 capsule by mouth daily at 12 noon.   Yes [provider]  betamethasone valerate (VALISONE) 0.1 % cream APPLY TO AFFECTED AREA TWICE A DAY Patient taking differently: Apply 1 application topically 2 (two) times daily. 02/04/20  Yes CPleas Koch NP  calcium carbonate (OSCAL) 1500 (600 Ca) MG TABS tablet Take 600 mg of elemental calcium by mouth at bedtime.   Yes [provider]  cholecalciferol (VITAMIN D3) 25 MCG (1000 UT)  tablet Take 1,000 Units by mouth at bedtime.   Yes [provider]  epoetin alfa (EPOGEN,PROCRIT) 125366UNIT/ML injection Inject 20,000 Units into the skin as directed. Receives if hemoglobin <10   Yes [provider]  furosemide (LASIX) 40 MG tablet TAKE 1 TABLET BY MOUTH  DAILY Patient taking differently: Take 40 mg by mouth daily. 09/07/19  Yes CPleas Koch NP  glimepiride (AMARYL) 4 MG tablet TAKE 1 TABLET BY MOUTH  DAILY WITH BREAKFAST Patient taking differently: Take 4 mg by mouth daily with breakfast. 01/04/20  Yes CPleas Koch NP  Hydrocortisone (PROCTOSOL HC RE) Place 1 Dose rectally as needed.   Yes [provider]  Insulin Glargine (LANTUS) 100 UNIT/ML Solostar Pen Inject 6 Units into the skin at bedtime.   Yes [provider]  IRON PO Take 2 tablets by mouth 2 (two) times daily.   Yes [provider]  levothyroxine (SYNTHROID) 175 MCG tablet TAKE 1 TABLET BY MOUTH IN  THE MORNING ON AN EMPTY  STOMACH WITH A FULL GLASS  OF WATER Patient taking differently: Take 175 mcg by mouth daily before breakfast. 01/04/20  Yes Pleas Koch, NP  meclizine (ANTIVERT) 12.5 MG tablet Take 1 tablet (12.5 mg total) by mouth 3 (three) times daily as needed for dizziness. 02/10/20  Yes Pleas Koch, NP  Multiple Vitamin (MULTIVITAMIN) capsule Take 1 capsule by mouth daily.   Yes [provider]  nitroGLYCERIN (NITROSTAT) 0.4 MG SL tablet Place 0.4 mg under the tongue every 5 (five) minutes as needed for chest pain.   Yes [provider]  Nutritional Supplements (JUICE PLUS FIBRE PO) Take 1 capsule by mouth daily.   Yes [provider]  Omega-3 Fatty Acids (FISH OIL PO) Take 2 capsules by mouth 2 (two) times daily.   Yes [provider]  pantoprazole (PROTONIX) 20 MG tablet TAKE 1 TABLET BY MOUTH  DAILY Patient taking differently: Take 20 mg by mouth daily. 11/12/19  Yes Pleas Koch, NP  ranolazine  (RANEXA) 1000 MG SR tablet Take 1 tablet (1,000 mg total) by mouth 2 (two) times daily. 02/19/19  Yes Theora Gianotti, NP  timolol (TIMOPTIC) 0.5 % ophthalmic solution Place 1 drop into both eyes daily. 03/17/17  Yes [provider]  Blood Glucose Monitoring Suppl (ONETOUCH VERIO) w/Device KIT 1 Device by Does not apply route daily. Use as instructed to test blood sugar daily 04/22/17   Pleas Koch, NP  Insulin Pen Needle 29G X 12.7MM MISC BD Ultrafine Pen Needle Use as instructed to inject insulin daily 02/18/17   Pleas Koch, NP  Lancets [facility] DELICA PLUS [id number]) [address] USE AS INSTRUCT TO TEST BLOOD SUGAR ONCE DAILY 07/05/19   Pleas Koch, NP  [facility] VERIO test strip USE AS INSTRUCTED TO TEST BLOOD SUGAR ONCE DAILY 07/26/19   Pleas Koch, NP    Inpatient Medications: Scheduled Meds: . acidophilus  1 capsule Oral Daily  . aspirin EC  81 mg Oral QHS  . atorvastatin  40 mg Oral QHS  . insulin aspart  0-5 Units Subcutaneous QHS  . insulin aspart  0-9 Units Subcutaneous TID WC  . levothyroxine  175 mcg Oral QAC breakfast  . pantoprazole  20 mg Oral Daily  . sodium chloride flush  3 mL Intravenous Q12H  . timolol  1 drop Both Eyes Daily   Continuous Infusions:  PRN Meds: acetaminophen **OR** acetaminophen, nitroGLYCERIN, ondansetron **OR** ondansetron (ZOFRAN) IV  Allergies:   Allergies  Allergen Reactions  . Heparin Other (See Comments)    "it caused low platelet count the made him have a clot"  . Patiromer Other (See Comments)  . Sodium Zirconium Cyclosilicate Other (See Comments)    Social History:   Social History   Socioeconomic History  . Marital status: Widowed    Spouse name: Not on file  . Number of children: 4  . Years of education: 16  . Highest education level: Bachelor's degree (e.g., BA, AB, BS)  Occupational History  . Occupation: retired  Tobacco Use  . Smoking status: Former Smoker    Packs/day: 1.50     Years: 25.00    Pack years: 37.50    Types: Cigarettes    Quit date: 1978    Years since quitting: 43.9  . Smokeless tobacco: Never Used  Vaping Use  . Vaping Use: Never used  Substance and Sexual Activity  . Alcohol use: Yes    Alcohol/week: 1.0 standard drink    Types: 1 Glasses of wine per week  Comment: socially   . Drug use: No  . Sexual activity: Not Currently  Other Topics Concern  . Not on file  Social History Narrative   Widower.   4 children, 7 grandchildren, 1 great grandchild.   Retired. Once worked as an account.    Enjoys exercising, spending time with family.    Social Determinants of Health   Financial Resource Strain: Not on file  Food Insecurity: Not on file  Transportation Needs: Not on file  Physical Activity: Not on file  Stress: Not on file  Social Connections: Not on file  Intimate Partner Violence: Not on file     Family History:   Family History  Problem Relation Age of Onset  . Heart attack Mother   . Heart disease Mother   . Stroke Mother   . Diabetes Father   . Coronary artery disease Sister     ROS:  Review of Systems  Constitutional: Positive for malaise/fatigue. Negative for chills, diaphoresis, fever and weight loss.  HENT: Negative for congestion.   Eyes: Negative for discharge and redness.  Respiratory: Negative for cough, sputum production, shortness of breath and wheezing.   Cardiovascular: Negative for chest pain, palpitations, orthopnea, claudication, leg swelling and PND.  Gastrointestinal: Negative for abdominal pain, heartburn, nausea and vomiting.  Genitourinary: Negative for hematuria.  Musculoskeletal: Positive for falls. Negative for myalgias.  Skin: Negative for rash.  Neurological: Positive for dizziness, weakness and headaches. Negative for tingling, tremors, sensory change, speech change, focal weakness, seizures and loss of consciousness.  Endo/Heme/Allergies: Does not bruise/bleed easily.   Psychiatric/Behavioral: Negative for substance abuse. The patient is not nervous/anxious.   All other systems reviewed and are negative.     Physical Exam/Data:   Vitals:   02/14/20 0500 02/14/20 0600 02/14/20 0700 02/14/20 0905  BP: (!) 149/63 (!) 144/66 (!) 137/53   Pulse: 62 66 63   Resp: (!) 8 (!) 9 11   Temp:    97.7 F (36.5 C)  TempSrc:    Oral  SpO2: 97% 98% 96%   Weight:      Height:       No intake or output data in the 24 hours ending 02/14/20 1039 Filed Weights   02/13/20 1556  Weight: 70.3 kg   Body mass index is 22.24 kg/m.   Physical Exam: General: Elderly and frail appearing, in no acute distress. Head: Normocephalic, atraumatic, sclera non-icteric, no xanthomas, nares without discharge.  Neck: Negative for carotid bruits. JVD not elevated. Lungs: Clear bilaterally to auscultation without wheezes, rales, or rhonchi. Breathing is unlabored. Heart: RRR with S1 S2. No murmurs, rubs, or gallops appreciated. Abdomen: Soft, non-tender, non-distended with normoactive bowel sounds. No hepatomegaly. No rebound/guarding. No obvious abdominal masses. Msk:  Strength and tone appear normal for age. Extremities: No clubbing or cyanosis. No edema. Distal pedal pulses are 2+ and equal bilaterally. Neuro: Alert and oriented X 3. No facial asymmetry. No focal deficit. Moves all extremities spontaneously. Psych:  Responds to questions appropriately with a normal affect.   EKG:  The EKG was personally reviewed and demonstrates: is overall poor quality with significant underlying artifact/baseline wandering though does appear to show sinus rhythm, 66 bpm, trifascicular block without acute st/t changes Telemetry:  Telemetry was personally reviewed and demonstrates: SR with first degree AV block  Weights: Filed Weights   02/13/20 1556  Weight: 70.3 kg    Relevant CV Studies: 2D echo pending __________   Elwyn Reach patch 08/2019: The patient was monitored for  14 days.  The  predominant rhythm was sinus with an average rate of 59 bpm (rande 44-86 bpm in sinus).  There were rare PAC's and PVC's. Two atrial runs lasting up to 8 beats were observed, with a maximum rate of 102 bpm.  No sustained arrhythmia or prolonged pause was identified.  Patient triggered events correspond to sinus rhythm and PVC's.   Predominantly sinus rhythm with rare PAC's and PVC's, as well as two brief atrial runs. __________  2D echo 02/2017: - Left ventricle: The cavity size was normal. Systolic function was  normal. The estimated ejection fraction was in the range of 55%  to 60%. Wall motion was normal; there were no regional wall  motion abnormalities. Left ventricular diastolic function  parameters were normal.  - Mitral valve: There was mild regurgitation.  - Left atrium: The atrium was mildly dilated.  - Right ventricle: Systolic function was normal.  - Tricuspid valve: There was moderate regurgitation.  - Pulmonary arteries: Systolic pressure was midlly eleavted. PA  peak pressure: 34 mm Hg (S). __________  Pharmacologic MPI 12/2013 [facility]) Moderate sized, moderate in severity defect involving the inferior wall that was partially reversible, EF 58%  Laboratory Data:  Chemistry Recent Labs  Lab 02/13/20 1557 02/14/20 0422  NA 134* 136  K 5.1 4.1  CL 97* 102  CO2 29 26  GLUCOSE 334* 130*  BUN 48* 39*  CREATININE 2.26* 1.73*  CALCIUM 9.7 8.9  GFRNONAA 27* 37*  ANIONGAP 8 8    Recent Labs  Lab 02/13/20 1557  PROT 7.6  ALBUMIN 3.6  AST 29  ALT 23  ALKPHOS 76  BILITOT 0.8   Hematology Recent Labs  Lab 02/11/20 1242 02/13/20 1557 02/14/20 0422  WBC 5.7 7.4 7.0  RBC 2.98* 3.03* 2.68*  HGB 10.5* 10.6* 9.4*  HCT 30.6* 31.3* 27.1*  MCV 102.7* 103.3* 101.1*  MCH 35.2* 35.0* 35.1*  MCHC 34.3 33.9 34.7  RDW 12.0 11.9 12.0  PLT 178 203 163   Cardiac EnzymesNo results for input(s): TROPONINI in the last 168 hours. No results for  input(s): TROPIPOC in the last 168 hours.  BNP Recent Labs  Lab 02/13/20 1557  BNP 64.7    DDimer No results for input(s): DDIMER in the last 168 hours.  Radiology/Studies:  DG Chest 2 View  Result Date: 02/13/2020 IMPRESSION: 1. No acute cardiopulmonary abnormality. 2. No acute traumatic findings in the chest. 3. Stable chronic scarring in the lung bases. 4. Stable postoperative changes related to prior CABG. Electronically Signed   By: Lovena Le M.D.   On: 02/13/2020 18:33   CT HEAD WO CONTRAST  Result Date: 02/13/2020 IMPRESSION: 1. No evidence of significant acute traumatic injury to the skull or brain. 2. Severe cerebral and cerebellar atrophy with chronic microvascular ischemic changes in the cerebral white matter, as above. Electronically Signed   By: Vinnie Langton M.D.   On: 02/13/2020 16:43   CT Cervical Spine Wo Contrast  Result Date: 02/13/2020 IMPRESSION: 1. No acute fracture or traumatic listhesis of the cervical spine. 2. Mild-to-moderate multilevel degenerative changes as described above. 3. Effacement atlantodental interval with extensive calcific pannus formation, can be seen in the setting of rheumatoid arthropathy or CPPD arthropathy. 4. Bony fusions across the posterior elements/facets C2-3, C4-5. 5. Cervical carotid atherosclerosis. Electronically Signed   By: Lovena Le M.D.   On: 02/13/2020 19:26   US Carotid Bilateral  Result Date: 02/14/2020 IMPRESSION: 1. Moderate amount of right-sided atherosclerotic plaque results  in elevated peak systolic velocities with the right internal carotid artery compatible with the lower end of the 50-69% luminal narrowing range. Further evaluation with CTA could be performed as clinically indicated. 2. Post left-sided carotid CEA without evidence of a residual or recurrent hemodynamically significant narrowing within the left internal carotid artery however note is made of a moderate amount of focal eccentric hypoechoic  plaque within the left carotid bulb. Again, this could be further evaluated with CTA as clinically indicated. Electronically Signed   By: Sandi Mariscal M.D.   On: 02/14/2020 07:50    Assessment and Plan:   1.  Trifascicular block: -Known with prior outpatient cardiac monitoring in 07/2019 demonstrating no sustained arrhythmias, high-grade AV block, or prolonged pauses -EKG in the ED shows a known trifascicular block with an improved PR-interval when compared to prior -Repeat EKG to obtain a better tracing -Continue to monitor on telemetry -Recommend he is evaluated by EP on 12/14, we will arrange -Continue to avoid AV nodal blocking medications -Mild hyperkalemia has improved -Magnesium at goal -TSH pending -Consider holding timolol eyedrops as well, though this is deferred to internal medicine  2.  CAD status post CABG with demand ischemia: -Currently chest pain free -High-sensitivity opponent of 26 with a delta of 29 likely in the setting of supply demand ischemia with known multivessel CAD -Not consistent with ACS, no indication for heparin (history of HIT) -Echo pending -No plans for inpatient ischemic evaluation at this time given his advanced age and comorbid conditions including underlying CKD -Continue current medical therapy including and Lipitor  3.  HTN: -Blood pressure initially elevated though is currently improved -Continue to monitor -Not currently on antihypertensive medications  4.  HLD: -LDL at goal -Continue atorvastatin  5. CKD stage IIIB: -Stable -Monitor   6. Carotid artery disease: -Imaging as above -ASA -Lipitor   7. Anemia of chronic disease: -Stable -Monitor    For questions or updates, please contact [place] Please consult [email or web address] for contact info under Cardiology/STEMI.   Signed, Christell Faith, PA-C [place] Pager: [phone] 02/14/2020, 10:39 AM

## 2020-02-14 NOTE — Progress Notes (Signed)
PROGRESS NOTE    Tommy [name]  GHW:[id number] DOB: 15-May-1926 DOA: 02/13/2020 PCP: Pleas Koch, NP  Outpatient Specialists: [facility] Dr. Saunders Revel    Brief Narrative:   Tommy [name] is a 86 y.o. male with medical history significant for CAD status post CABG, CKD 3B, carotid artery stenosis status post endarterectomy, PAD, anemia of chronic disease, diabetes, hypothyroidism, trifascicular block, seen by his cardiologist, Dr. Saunders Revel on 11/17 with complaints of lightheadedness, who presents to the emergency room with a 4-week complaint of continued lightheadedness, resulting in a fall,hitting the back of his head but not losing consciousness.this was his second fall in two weeks. He denies chest pain, nausea vomiting or diaphoresis, denies shortness of breath, pain or swelling in the legs.  Denies headache, visual changes or one-sided weakness numbness or tingling.  Denies abdominal pain or change in bowel habits.   Assessment & Plan:   Active Problems:   Coronary artery disease of native artery of native heart with stable angina pectoris (HCC)   Type 2 diabetes mellitus ([place])   Hypothyroidism   Carotid artery disease (HCC)   H/O endarterectomy   Anemia of chronic disease   Trifascicular block   PAD (peripheral artery disease) (HCC)   Acute kidney injury superimposed on CKD 3b (HCC)   Postural dizziness with presyncope   Elevated troponin   # Presyncope # CAD # Angina Several months worsening presyncope with occasional falls, usually when up and moving around. DDX includes orthostasis but given hx of trifasciular block cardiac conduction etiology is possible. Last echo was in 7893 w normal systolic function and no sig structural abnormalities. Carotid doppler shows moderate right stenosis but w/o sig b/l disease do not think the culprit. In terms of orthostasis does take lasix. Fell yesterday, ct head and c spine nothing acute.Hx of CABG in 1994, stable CAD seen on 2017  cath. - check orthostatic vital signs - maintain telemetry - cardiology consulted, recs pending - hold lasix and ranolazine, could contribute to current symptoms  cont ranolazine for  # CKD stage 3 b Here cr at baseline this morning - monitor  # Elevated troponin Mild, 26>29, no chest pain, no ischemic changes on EKG. Likely supply/demand mismatch - monitor tele  # Type 2 diabetes mellitus (HCC) Here glucose wnl - hold home glimepiride and insulin - SSI  # Hypothyroidism - check tsh, cont home levo  # Carotid artery disease ([place]) H/O endarterectomy. Moderate right stenosis on dopplers, plaque seen left carotid bulb. Unlikely contributing to current symptoms - will touch base w/ vascular regarding results -Continue antiplatelet and statin  # Anemia of chronic disease Followed by heme. Hemoglobin ~10 which is baseline - outpt f/u  # PAD (peripheral artery disease) (HCC) - Continue antiplatelet and statin  # History Heparin-induced thrombocytopenia - avoid heparin-containing agents   DVT prophylaxis: SCDs Code Status: full Family Communication: none @ bedside  Status is: Observation  The patient remains OBS appropriate and will d/c before 2 midnights. pending cards consult  Dispo: The patient is from: Home              Anticipated d/c is to: Home              Anticipated d/c date is: 0-2 days              Patient currently is not medically stable to d/c.        Consultants:  cardiology  Procedures: none  Antimicrobials:  none  Subjective: Feeling well, no presyncope or syncope, has appetite, no cp or palpitations  Objective: Vitals:   02/14/20 0400 02/14/20 0500 02/14/20 0600 02/14/20 0700  BP: (!) 159/61 (!) 149/63 (!) 144/66 (!) 137/53  Pulse: 63 62 66 63  Resp: (!) 8 (!) 8 (!) 9 11  Temp:      TempSrc:      SpO2: 98% 97% 98% 96%  Weight:      Height:       No intake or output data in the 24 hours ending 02/14/20 0929 Filed  Weights   02/13/20 1556  Weight: 70.3 kg    Examination:  General exam: Appears calm and comfortable  Respiratory system: Clear to auscultation. Respiratory effort normal. Cardiovascular system: S1 & S2 heard, RRR. Systolic murmur Gastrointestinal system: Abdomen is nondistended, soft and nontender. No organomegaly or masses felt. Normal bowel sounds heard. Central nervous system: Alert and oriented. No focal neurological deficits. Extremities: Symmetric 5 x 5 power. Skin: No rashes, lesions or ulcers Psychiatry: Judgement and insight appear normal. Mood & affect appropriate.     Data Reviewed: I have personally reviewed following labs and imaging studies  CBC: Recent Labs  Lab 02/11/20 1242 02/13/20 1557 02/14/20 0422  WBC 5.7 7.4 7.0  NEUTROABS 3.9  --   --   HGB 10.5* 10.6* 9.4*  HCT 30.6* 31.3* 27.1*  MCV 102.7* 103.3* 101.1*  PLT 178 203 269   Basic Metabolic Panel: Recent Labs  Lab 02/13/20 1557 02/14/20 0422  NA 134* 136  K 5.1 4.1  CL 97* 102  CO2 29 26  GLUCOSE 334* 130*  BUN 48* 39*  CREATININE 2.26* 1.73*  CALCIUM 9.7 8.9  MG 2.3  --    GFR: Estimated Creatinine Clearance: 27.7 mL/min (A) (by C-G formula based on SCr of 1.73 mg/dL (H)). Liver Function Tests: Recent Labs  Lab 02/13/20 1557  AST 29  ALT 23  ALKPHOS 76  BILITOT 0.8  PROT 7.6  ALBUMIN 3.6   No results for input(s): LIPASE, AMYLASE in the last 168 hours. No results for input(s): AMMONIA in the last 168 hours. Coagulation Profile: No results for input(s): INR, PROTIME in the last 168 hours. Cardiac Enzymes: No results for input(s): CKTOTAL, CKMB, CKMBINDEX, TROPONINI in the last 168 hours. BNP (last 3 results) No results for input(s): PROBNP in the last 8760 hours. HbA1C: No results for input(s): HGBA1C in the last 72 hours. CBG: Recent Labs  Lab 02/13/20 1915 02/13/20 2139 02/13/20 2350 02/14/20 0436 02/14/20 0812  GLUCAP 180* 101* 153* 116* 80   Lipid  Profile: No results for input(s): CHOL, HDL, LDLCALC, TRIG, CHOLHDL, LDLDIRECT in the last 72 hours. Thyroid Function Tests: No results for input(s): TSH, T4TOTAL, FREET4, T3FREE, THYROIDAB in the last 72 hours. Anemia Panel: No results for input(s): VITAMINB12, FOLATE, FERRITIN, TIBC, IRON, RETICCTPCT in the last 72 hours. Urine analysis:    Component Value Date/Time   COLORURINE YELLOW (A) 02/13/2020 2003   APPEARANCEUR CLEAR (A) 02/13/2020 2003   LABSPEC 1.009 02/13/2020 2003   PHURINE 6.0 02/13/2020 2003   GLUCOSEU >=500 (A) 02/13/2020 2003   HGBUR NEGATIVE 02/13/2020 2003   BILIRUBINUR NEGATIVE 02/13/2020 2003   BILIRUBINUR negative 06/08/2019 [place] 02/13/2020 2003   PROTEINUR NEGATIVE 02/13/2020 2003   UROBILINOGEN 0.2 06/08/2019 1746   NITRITE NEGATIVE 02/13/2020 2003   LEUKOCYTESUR NEGATIVE 02/13/2020 2003   Sepsis Labs: @LABRCNTIP (procalcitonin:4,lacticidven:4)  ) Recent Results (from the past 240 hour(s))  Resp Panel  by RT-PCR (Flu A&B, Covid) Nasopharyngeal Swab     Status: None   Collection Time: 02/13/20  8:02 PM   Specimen: Nasopharyngeal Swab; Nasopharyngeal(NP) swabs in vial transport medium  Result Value Ref Range Status   SARS Coronavirus 2 by RT PCR NEGATIVE NEGATIVE Final    Comment: (NOTE) SARS-CoV-2 target nucleic acids are NOT DETECTED.  The SARS-CoV-2 RNA is generally detectable in upper respiratory specimens during the acute phase of infection. The lowest concentration of SARS-CoV-2 viral copies this assay can detect is 138 copies/mL. A negative result does not preclude SARS-Cov-2 infection and should not be used as the sole basis for treatment or other patient management decisions. A negative result may occur with  improper specimen collection/handling, submission of specimen other than nasopharyngeal swab, presence of viral mutation(s) within the areas targeted by this assay, and inadequate number of viral copies(<138  copies/mL). A negative result must be combined with clinical observations, patient history, and epidemiological information. The expected result is Negative.  Fact Sheet for Patients:  EntrepreneurPulse.com.au  Fact Sheet for Healthcare Providers:  IncredibleEmployment.be  This test is no t yet approved or cleared by the [place] FDA and  has been authorized for detection and/or diagnosis of SARS-CoV-2 by FDA under an Emergency Use Authorization (EUA). This EUA will remain  in effect (meaning this test can be used) for the duration of the COVID-19 declaration under Section 564(b)(1) of the Act, 21 U.S.C.section 360bbb-3(b)(1), unless the authorization is terminated  or revoked sooner.       Influenza A by PCR NEGATIVE NEGATIVE Final   Influenza B by PCR NEGATIVE NEGATIVE Final    Comment: (NOTE) The Xpert Xpress SARS-CoV-2/FLU/RSV plus assay is intended as an aid in the diagnosis of influenza from Nasopharyngeal swab specimens and should not be used as a sole basis for treatment. Nasal washings and aspirates are unacceptable for Xpert Xpress SARS-CoV-2/FLU/RSV testing.  Fact Sheet for Patients: EntrepreneurPulse.com.au  Fact Sheet for Healthcare Providers: IncredibleEmployment.be  This test is not yet approved or cleared by the [place] FDA and has been authorized for detection and/or diagnosis of SARS-CoV-2 by FDA under an Emergency Use Authorization (EUA). This EUA will remain in effect (meaning this test can be used) for the duration of the COVID-19 declaration under Section 564(b)(1) of the Act, 21 U.S.C. section 360bbb-3(b)(1), unless the authorization is terminated or revoked.  Performed at [facility], [address], [place], [place] [address]          Radiology Studies: DG Chest 2 View  Result Date: 02/13/2020 CLINICAL DATA:  Dizziness, fall EXAM: CHEST - 2 VIEW  COMPARISON:  Radiograph 01/19/2018 FINDINGS: Postsurgical changes related to prior CABG including intact and aligned sternotomy wires and multiple surgical clips projecting over the mediastinum. Stable cardiomediastinal contours. Inferior vena cava filter seen on lateral radiograph. Some coarse reticular opacities are present in the bilateral basilar periphery, unchanged from comparison and likely reflect some chronic scarring. No focal airspace consolidation or convincing features of edema. No pneumothorax or visible effusion. No acute osseous or soft tissue abnormality. Degenerative changes are present in the imaged spine and shoulders. IMPRESSION: 1. No acute cardiopulmonary abnormality. 2. No acute traumatic findings in the chest. 3. Stable chronic scarring in the lung bases. 4. Stable postoperative changes related to prior CABG. Electronically Signed   By: Lovena Le M.D.   On: 02/13/2020 18:33   CT HEAD WO CONTRAST  Result Date: 02/13/2020 CLINICAL DATA:  86 year old male with history of  head trauma from a fall. EXAM: CT HEAD WITHOUT CONTRAST TECHNIQUE: Contiguous axial images were obtained from the base of the skull through the vertex without intravenous contrast. COMPARISON:  No priors. FINDINGS: Brain: Severe cerebral and cerebellar atrophy. Patchy and confluent areas of decreased attenuation are noted throughout the deep and periventricular white matter of the cerebral hemispheres bilaterally, compatible with chronic microvascular ischemic disease. No evidence of acute infarction, hemorrhage, hydrocephalus, extra-axial collection or mass lesion/mass effect. Vascular: No hyperdense vessel or unexpected calcification. Skull: Normal. Negative for fracture or focal lesion. Sinuses/Orbits: No acute finding. Other: None. IMPRESSION: 1. No evidence of significant acute traumatic injury to the skull or brain. 2. Severe cerebral and cerebellar atrophy with chronic microvascular ischemic changes in the  cerebral white matter, as above. Electronically Signed   By: Vinnie Langton M.D.   On: 02/13/2020 16:43   CT Cervical Spine Wo Contrast  Result Date: 02/13/2020 CLINICAL DATA:  Fall with posterior head strike, denies loss of consciousness EXAM: CT CERVICAL SPINE WITHOUT CONTRAST TECHNIQUE: Multidetector CT imaging of the cervical spine was performed without intravenous contrast. Multiplanar CT image reconstructions were also generated. COMPARISON:  Contemporary CT head FINDINGS: Alignment: Stabilization collar absent at time of examination. Effacement atlantodental interval with associated arthrosis between the anterior arch C1, dens and basion with extensive calcific pannus formation, can be seen in the setting of rheumatoid arthropathy or CPPD arthropathy. Otherwise normal alignment of the craniocervical and atlantoaxial articulations. Straightening of the normal cervical lordosis. Anterolisthesis C3 on 4 of approximately 2 mm, favored to be on a degenerative basis. No evidence of traumatic listhesis. No abnormally widened, perched or jumped facets. Skull base and vertebrae: No acute skull base fracture. No vertebral body fracture or height loss. The osseous structures appear diffusely demineralized which may limit detection of small or nondisplaced fractures. Bony fusions across the posterior elements/facets C2-3, C4-5. Soft tissues and spinal canal: No pre or paravertebral fluid or swelling. No visible canal hematoma. Large calcific pannus formation at C1-2, posterior to the dens. Disc levels: Multilevel intervertebral disc height loss with spondylitic endplate changes. Larger disc osteophyte complexes present C5-6, C6-7 resulting in mild spinal canal stenosis at these levels. Diffuse uncinate spurring and facet hypertrophic changes are present as well resulting in mild-to-moderate multilevel neural foraminal narrowing with more moderate to severe narrowing bilaterally C6-7. Upper chest: No acute  abnormality in the upper chest or imaged lung apices. Biapical pleuroparenchymal scarring. Cervical carotid atherosclerosis. Other: Normal thyroid. IMPRESSION: 1. No acute fracture or traumatic listhesis of the cervical spine. 2. Mild-to-moderate multilevel degenerative changes as described above. 3. Effacement atlantodental interval with extensive calcific pannus formation, can be seen in the setting of rheumatoid arthropathy or CPPD arthropathy. 4. Bony fusions across the posterior elements/facets C2-3, C4-5. 5. Cervical carotid atherosclerosis. Electronically Signed   By: Lovena Le M.D.   On: 02/13/2020 19:26   US Carotid Bilateral  Result Date: 02/14/2020 CLINICAL DATA:  Syncopal episode. History of CAD, hypertension, hyperlipidemia and diabetes. History of left-sided CEA in 2004. EXAM: BILATERAL CAROTID DUPLEX ULTRASOUND TECHNIQUE: Pearline Cables scale imaging, color Doppler and duplex ultrasound were performed of bilateral carotid and vertebral arteries in the neck. COMPARISON:  None. FINDINGS: Criteria: Quantification of carotid stenosis is based on velocity parameters that correlate the residual internal carotid diameter with NASCET-based stenosis levels, using the diameter of the distal internal carotid lumen as the denominator for stenosis measurement. The following velocity measurements were obtained: RIGHT ICA: 132/16 cm/sec CCA: 10/9 cm/sec SYSTOLIC ICA/CCA RATIO:  2.0 ECA: 110 cm/sec LEFT ICA: 91/12 cm/sec CCA: 63/84 cm/sec SYSTOLIC ICA/CCA RATIO:  1.0 ECA: 143 cm/sec RIGHT CAROTID ARTERY: There is a minimal amount of eccentric echogenic plaque scattered throughout the right common carotid artery. There is a moderate amount of eccentric echogenic plaque within the right carotid bulb (image 13 and 16 and 35). There is a moderate amount of eccentric echogenic partially shadowing plaque involving the origin and proximal aspects of the right internal carotid artery (image 46), which results in elevated peak  systolic velocities within the mid aspect of the right internal carotid artery. Greatest acquired peak systolic velocity within mid aspect of the right internal carotid artery measures 132 centimeters/second (image 52). RIGHT VERTEBRAL ARTERY:  Antegrade Flow LEFT CAROTID ARTERY: There is a minimal amount of eccentric echogenic plaque scattered throughout the left common carotid artery. There is a minimal amount of intimal thickening/postsurgical change involving the left carotid bulb extending to involve the origin and proximal aspects of the left internal carotid artery. Note is made of a moderate amount of focal eccentric hypoechoic plaque within the left carotid bulb (image 88), not resulting in elevated peak systolic velocities within the interrogated course of the left internal carotid artery. LEFT VERTEBRAL ARTERY:  Antegrade flow IMPRESSION: 1. Moderate amount of right-sided atherosclerotic plaque results in elevated peak systolic velocities with the right internal carotid artery compatible with the lower end of the 50-69% luminal narrowing range. Further evaluation with CTA could be performed as clinically indicated. 2. Post left-sided carotid CEA without evidence of a residual or recurrent hemodynamically significant narrowing within the left internal carotid artery however note is made of a moderate amount of focal eccentric hypoechoic plaque within the left carotid bulb. Again, this could be further evaluated with CTA as clinically indicated. Electronically Signed   By: Sandi Mariscal M.D.   On: 02/14/2020 07:50        Scheduled Meds: . acidophilus  1 capsule Oral Daily  . aspirin EC  81 mg Oral QHS  . atorvastatin  40 mg Oral QHS  . insulin aspart  0-5 Units Subcutaneous QHS  . insulin aspart  0-9 Units Subcutaneous TID WC  . levothyroxine  175 mcg Oral QAC breakfast  . pantoprazole  20 mg Oral Daily  . ranolazine  1,000 mg Oral BID  . sodium chloride flush  3 mL Intravenous Q12H  .  timolol  1 drop Both Eyes Daily   Continuous Infusions:   LOS: 0 days    Time spent: 31 min    Desma Maxim, MD [facility]   If 7PM-7AM, please contact night-coverage [email or web address] Password [facility] 02/14/2020, 9:29 AM

## 2020-02-14 NOTE — ED Notes (Signed)
Pt ambulated to toilet and back without difficulty. Unassisted, steady on feet

## 2020-02-14 NOTE — Progress Notes (Signed)
*  PRELIMINARY RESULTS* Echocardiogram 2D Echocardiogram has been performed.  Tommy [name] 02/14/2020, 8:55 AM

## 2020-02-14 NOTE — Progress Notes (Addendum)
PHARMACY -  BRIEF PHARMCY NOTE   Order for Lovenox 30 mg ordered, verified, and given x 1 dose on 12/12 @ 2221 with "unknown" allergy to heparin.  After dose given, pt reported heparin allergy reaction - "it caused low platelet count the made him have a clot."  Order DCed and MD notified and elected to ordered SCD for further DVT prophylaxis. Platelets 12/13 @ 0422 = 163.                 Renda Rolls, PharmD, MBA 02/14/2020 7:10 AM   Addendum 02/15/2020 @ 0730: Initially, patient's heparin allergy was documented as "unknown". However, after the heparin dose was given it was noted pt had a h/o heparin allergy. Upon inquiring about the heparin allergy, pt reported a h/o that " "it caused low platelet count and made him have a clot".   Kristeen Miss, PharmD Clinical Pharmacist

## 2020-02-14 NOTE — ED Notes (Signed)
PT assisted to BR

## 2020-02-15 ENCOUNTER — Inpatient Hospital Stay (INDEPENDENT_AMBULATORY_CARE_PROVIDER_SITE_OTHER): Payer: Medicare Other

## 2020-02-15 ENCOUNTER — Other Ambulatory Visit: Payer: Self-pay

## 2020-02-15 DIAGNOSIS — I453 Trifascicular block: Secondary | ICD-10-CM

## 2020-02-15 DIAGNOSIS — I25118 Atherosclerotic heart disease of native coronary artery with other forms of angina pectoris: Secondary | ICD-10-CM

## 2020-02-15 LAB — BASIC METABOLIC PANEL
Anion gap: 8 (ref 5–15)
BUN: 31 mg/dL — ABNORMAL HIGH (ref 8–23)
CO2: 25 mmol/L (ref 22–32)
Calcium: 9.3 mg/dL (ref 8.9–10.3)
Chloride: 102 mmol/L (ref 98–111)
Creatinine, Ser: 1.48 mg/dL — ABNORMAL HIGH (ref 0.61–1.24)
GFR, Estimated: 44 mL/min — ABNORMAL LOW (ref >60)
Glucose, Bld: 148 mg/dL — ABNORMAL HIGH (ref 70–99)
Potassium: 4.3 mmol/L (ref 3.5–5.1)
Sodium: 135 mmol/L (ref 135–145)

## 2020-02-15 LAB — CBG MONITORING, ED: Glucose-Capillary: 123 mg/dL — ABNORMAL HIGH (ref 70–99)

## 2020-02-15 LAB — CBC
HCT: 30.4 % — ABNORMAL LOW (ref 39.0–52.0)
Hemoglobin: 10.7 g/dL — ABNORMAL LOW (ref 13.0–17.0)
MCH: 36.3 pg — ABNORMAL HIGH (ref 26.0–34.0)
MCHC: 35.2 g/dL (ref 30.0–36.0)
MCV: 103.1 fL — ABNORMAL HIGH (ref 80.0–100.0)
Platelets: 179 10*3/uL (ref 150–400)
RBC: 2.95 MIL/uL — ABNORMAL LOW (ref 4.22–5.81)
RDW: 12 % (ref 11.5–15.5)
WBC: 8.6 10*3/uL (ref 4.0–10.5)
nRBC: 0 % (ref 0.0–0.2)

## 2020-02-15 NOTE — Progress Notes (Signed)
Patient is ready for discharge-- IV removed. Ziopatch cardiac monitor in place. AVS given.

## 2020-02-15 NOTE — Discharge Summary (Signed)
Tommy [name] CLE:[id number] DOB: [birth year] DOA: 02/13/2020  PCP: Pleas Koch, NP  Admit date: 02/13/2020 Discharge date: 02/15/2020  Time spent: 35 min minutes  Recommendations for Outpatient Follow-up:  1. 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 male with medical history significant for CAD status post CABG, CKD 3B, carotid artery stenosis status post endarterectomy, PAD, anemia of chronic disease, diabetes, hypothyroidism, trifascicular block, seen by his cardiologist, Dr. Saunders Revel on 11/17 with complaints of lightheadedness, who presents to the emergency room with a 4-week complaint of continued lightheadedness, resulting in a fall,hitting the back of his head but not losing consciousness.this was his second fall in two weeks. He denies chest pain, nausea vomiting or diaphoresis, denies shortness of breath, pain or swelling in the legs.  Denies headache, visual changes or one-sided weakness numbness or tingling.  Denies abdominal pain or change in bowel habits.  Hospital Course:  # Presyncope # CAD # Angina # Orthostasis Several months worsening presyncope with occasional falls, usually when up and moving around. DDX includes orthostasis but given hx of trifasciular block cardiac conduction etiology is  possible. Last echo was in 9449 w normal systolic function and no sig structural abnormalities. Carotid doppler shows moderate right stenosis but w/o sig b/l disease do not think the culprit. In terms of orthostasis does take lasix. Fell day prior to arrival, ct head and c spine nothing acute.Hx of CABG in 1994, stable CAD seen on 2017 cath. EP Dr. Quentin Ore saw, thinks more consistent w/ orthostasis (orthostatic vital signs were positive) than with significant conduction disease. Advises ziopatch and outpatient cardiology f/u - ziopatch to be placed prior to d/c, f/u dr. Saunders Revel - d/c lasix - consider hold of ranolazine  # CKD stage 3 b Here cr at baseline this morning  # Elevated troponin Mild, 26>29, no chest pain, no ischemic changes on EKG. Likely supply/demand mismatch  # Type 2 diabetes mellitus (HCC) Here glucose wnl  # Carotid artery disease ([place]) H/O endarterectomy. Moderate right stenosis on dopplers, plaque seen left carotid bulb. Unlikely contributing to current symptoms. Touched base w/ vascular, no further eval/intervention indicated, continue outpt monitoring  # Anemia of chronic disease Followed by heme. Hemoglobin ~10 which is baseline  # History Heparin-induced thrombocytopenia - avoided heparin-containing agents  Procedures:  none   Consultations:  Cardiology, EP  Discharge Exam: Vitals:   02/15/20 0415 02/15/20 0846  BP:  (!) 179/69  Pulse: 72 78  Resp:  18  Temp:  98.2 F (36.8 C)  SpO2: 96% 99%   General exam: Appears calm and comfortable  Respiratory system: Clear to auscultation. Respiratory effort normal. Cardiovascular system: S1 & S2 heard, RRR. Systolic murmur Gastrointestinal system: Abdomen is nondistended, soft and nontender. No organomegaly or masses felt. Normal bowel  sounds heard. Central nervous system: Alert and oriented. No focal neurological deficits. Extremities: Symmetric 5 x 5 power. Skin: No rashes, lesions or ulcers Psychiatry:  Judgement and insight appear normal. Mood & affect appropriate.   Discharge Instructions   Discharge Instructions    Diet - low sodium heart healthy   Complete by: As directed    Increase activity slowly   Complete by: As directed      Allergies as of 02/15/2020      Reactions   Heparin Other (See Comments)   "it caused low platelet count and made him have a clot"   Patiromer Other (See Comments)   Sodium Zirconium Cyclosilicate Other (See Comments)      Medication List    STOP taking these medications   furosemide 40 MG tablet Commonly known as: LASIX     TAKE these medications   ALIGN PREBIOTIC-PROBIOTIC PO Take 1 capsule by mouth daily at 12 noon.   aspirin 81 MG tablet Take 81 mg by mouth at bedtime.   atorvastatin 40 MG tablet Commonly known as: LIPITOR TAKE 1 TABLET BY MOUTH  DAILY What changed: when to take this   betamethasone valerate 0.1 % cream Commonly known as: VALISONE APPLY TO AFFECTED AREA TWICE A DAY What changed: See the new instructions.   calcium carbonate 1500 (600 Ca) MG Tabs tablet Commonly known as: OSCAL Take 600 mg of elemental calcium by mouth at bedtime.   cholecalciferol 25 MCG (1000 UNIT) tablet Commonly known as: VITAMIN D3 Take 1,000 Units by mouth at bedtime.   epoetin alfa 10000 UNIT/ML injection Commonly known as: EPOGEN Inject 20,000 Units into the skin as directed. Receives if hemoglobin <10   FISH OIL PO Take 2 capsules by mouth 2 (two) times daily.   glimepiride 4 MG tablet Commonly known as: AMARYL TAKE 1 TABLET BY MOUTH  DAILY WITH BREAKFAST   insulin glargine 100 UNIT/ML Solostar Pen Commonly known as: LANTUS Inject 6 Units into the skin at bedtime.   Insulin Pen Needle 29G X 12.7MM Misc BD Ultrafine Pen Needle Use as instructed to inject insulin daily   IRON PO Take 2 tablets by mouth 2 (two) times daily.   JUICE PLUS FIBRE PO Take 1 capsule by mouth daily.   levothyroxine 175 MCG tablet Commonly  known as: SYNTHROID TAKE 1 TABLET BY MOUTH IN  THE MORNING ON AN EMPTY  STOMACH WITH A FULL GLASS  OF WATER What changed: See the new instructions.   meclizine 12.5 MG tablet Commonly known as: ANTIVERT Take 1 tablet (12.5 mg total) by mouth 3 (three) times daily as needed for dizziness.   multivitamin capsule Take 1 capsule by mouth daily.   nitroGLYCERIN 0.4 MG SL tablet Commonly known as: NITROSTAT Place 0.4 mg under the tongue every 5 (five) minutes as needed for chest pain.   OneTouch Delica Plus [id number] Misc USE AS INSTRUCT TO TEST BLOOD SUGAR ONCE DAILY   OneTouch Verio test strip Generic drug: glucose blood USE AS INSTRUCTED TO TEST BLOOD SUGAR ONCE DAILY   OneTouch Verio w/Device Kit 1 Device by Does not apply route daily. Use as instructed to test blood sugar daily   pantoprazole 20 MG tablet Commonly known as: PROTONIX TAKE 1 TABLET BY MOUTH  DAILY   PROCTOSOL HC RE Place 1 Dose rectally as needed.   ranolazine 1000 MG SR tablet Commonly known as: RANEXA Take 1 tablet (1,000 mg total) by mouth 2 (two) times daily.   timolol 0.5 %  ophthalmic solution Commonly known as: TIMOPTIC Place 1 drop into both eyes daily.      Allergies  Allergen Reactions  . Heparin Other (See Comments)    "it caused low platelet count and made him have a clot"  . Patiromer Other (See Comments)  . Sodium Zirconium Cyclosilicate Other (See Comments)    Follow-up Information    Dew, Erskine Squibb, MD Follow up in 3 month(s).   Specialties: Vascular Surgery, Radiology, Interventional Cardiology Why: Carotid stenosis.  Establish care to follow carotid disease.  Can see Dew or Arna Medici.  See December 2021 ultrasound.  Will need carotid ultrasound at visit. Contact information: [place] [address] [phone]        Nelva Bush, MD. Schedule an appointment as soon as possible for a visit.   Specialty: Cardiology Contact information: [place] [address] [phone]                The results of significant diagnostics from this hospitalization (including imaging, microbiology, ancillary and laboratory) are listed below for reference.    Significant Diagnostic Studies: DG Chest 2 View  Result Date: 02/13/2020 CLINICAL DATA:  Dizziness, fall EXAM: CHEST - 2 VIEW COMPARISON:  Radiograph 01/19/2018 FINDINGS: Postsurgical changes related to prior CABG including intact and aligned sternotomy wires and multiple surgical clips projecting over the mediastinum. Stable cardiomediastinal contours. Inferior vena cava filter seen on lateral radiograph. Some coarse reticular opacities are present in the bilateral basilar periphery, unchanged from comparison and likely reflect some chronic scarring. No focal airspace consolidation or convincing features of edema. No pneumothorax or visible effusion. No acute osseous or soft tissue abnormality. Degenerative changes are present in the imaged spine and shoulders. IMPRESSION: 1. No acute cardiopulmonary abnormality. 2. No acute traumatic findings in the chest. 3. Stable chronic scarring in the lung bases. 4. Stable postoperative changes related to prior CABG. Electronically Signed   By: Lovena Le M.D.   On: 02/13/2020 18:33   CT HEAD WO CONTRAST  Result Date: 02/13/2020 CLINICAL DATA:  86 year old male with history of head trauma from a fall. EXAM: CT HEAD WITHOUT CONTRAST TECHNIQUE: Contiguous axial images were obtained from the base of the skull through the vertex without intravenous contrast. COMPARISON:  No priors. FINDINGS: Brain: Severe cerebral and cerebellar atrophy. Patchy and confluent areas of decreased attenuation are noted throughout the deep and periventricular white matter of the cerebral hemispheres bilaterally, compatible with chronic microvascular ischemic disease. No evidence of acute infarction, hemorrhage, hydrocephalus, extra-axial collection or mass lesion/mass  effect. Vascular: No hyperdense vessel or unexpected calcification. Skull: Normal. Negative for fracture or focal lesion. Sinuses/Orbits: No acute finding. Other: None. IMPRESSION: 1. No evidence of significant acute traumatic injury to the skull or brain. 2. Severe cerebral and cerebellar atrophy with chronic microvascular ischemic changes in the cerebral white matter, as above. Electronically Signed   By: Vinnie Langton M.D.   On: 02/13/2020 16:43   CT Cervical Spine Wo Contrast  Result Date: 02/13/2020 CLINICAL DATA:  Fall with posterior head strike, denies loss of consciousness EXAM: CT CERVICAL SPINE WITHOUT CONTRAST TECHNIQUE: Multidetector CT imaging of the cervical spine was performed without intravenous contrast. Multiplanar CT image reconstructions were also generated. COMPARISON:  Contemporary CT head FINDINGS: Alignment: Stabilization collar absent at time of examination. Effacement atlantodental interval with associated arthrosis between the anterior arch C1, dens and basion with extensive calcific pannus formation, can be seen in the setting of rheumatoid arthropathy  or CPPD arthropathy. Otherwise normal alignment of the craniocervical and atlantoaxial articulations. Straightening of the normal cervical lordosis. Anterolisthesis C3 on 4 of approximately 2 mm, favored to be on a degenerative basis. No evidence of traumatic listhesis. No abnormally widened, perched or jumped facets. Skull base and vertebrae: No acute skull base fracture. No vertebral body fracture or height loss. The osseous structures appear diffusely demineralized which may limit detection of small or nondisplaced fractures. Bony fusions across the posterior elements/facets C2-3, C4-5. Soft tissues and spinal canal: No pre or paravertebral fluid or swelling. No visible canal hematoma. Large calcific pannus formation at C1-2, posterior to the dens. Disc levels: Multilevel intervertebral disc height loss with spondylitic endplate  changes. Larger disc osteophyte complexes present C5-6, C6-7 resulting in mild spinal canal stenosis at these levels. Diffuse uncinate spurring and facet hypertrophic changes are present as well resulting in mild-to-moderate multilevel neural foraminal narrowing with more moderate to severe narrowing bilaterally C6-7. Upper chest: No acute abnormality in the upper chest or imaged lung apices. Biapical pleuroparenchymal scarring. Cervical carotid atherosclerosis. Other: Normal thyroid. IMPRESSION: 1. No acute fracture or traumatic listhesis of the cervical spine. 2. Mild-to-moderate multilevel degenerative changes as described above. 3. Effacement atlantodental interval with extensive calcific pannus formation, can be seen in the setting of rheumatoid arthropathy or CPPD arthropathy. 4. Bony fusions across the posterior elements/facets C2-3, C4-5. 5. Cervical carotid atherosclerosis. Electronically Signed   By: Kreg Shropshire M.D.   On: 02/13/2020 19:26   US Carotid Bilateral  Result Date: 02/14/2020 CLINICAL DATA:  Syncopal episode. History of CAD, hypertension, hyperlipidemia and diabetes. History of left-sided CEA in 2004. EXAM: BILATERAL CAROTID DUPLEX ULTRASOUND TECHNIQUE: Wallace Cullens scale imaging, color Doppler and duplex ultrasound were performed of bilateral carotid and vertebral arteries in the neck. COMPARISON:  None. FINDINGS: Criteria: Quantification of carotid stenosis is based on velocity parameters that correlate the residual internal carotid diameter with NASCET-based stenosis levels, using the diameter of the distal internal carotid lumen as the denominator for stenosis measurement. The following velocity measurements were obtained: RIGHT ICA: 132/16 cm/sec CCA: 65/9 cm/sec SYSTOLIC ICA/CCA RATIO:  2.0 ECA: 110 cm/sec LEFT ICA: 91/12 cm/sec CCA: 93/15 cm/sec SYSTOLIC ICA/CCA RATIO:  1.0 ECA: 143 cm/sec RIGHT CAROTID ARTERY: There is a minimal amount of eccentric echogenic plaque scattered throughout the  right common carotid artery. There is a moderate amount of eccentric echogenic plaque within the right carotid bulb (image 13 and 16 and 35). There is a moderate amount of eccentric echogenic partially shadowing plaque involving the origin and proximal aspects of the right internal carotid artery (image 46), which results in elevated peak systolic velocities within the mid aspect of the right internal carotid artery. Greatest acquired peak systolic velocity within mid aspect of the right internal carotid artery measures 132 centimeters/second (image 52). RIGHT VERTEBRAL ARTERY:  Antegrade Flow LEFT CAROTID ARTERY: There is a minimal amount of eccentric echogenic plaque scattered throughout the left common carotid artery. There is a minimal amount of intimal thickening/postsurgical change involving the left carotid bulb extending to involve the origin and proximal aspects of the left internal carotid artery. Note is made of a moderate amount of focal eccentric hypoechoic plaque within the left carotid bulb (image 88), not resulting in elevated peak systolic velocities within the interrogated course of the left internal carotid artery. LEFT VERTEBRAL ARTERY:  Antegrade flow IMPRESSION: 1. Moderate amount of right-sided atherosclerotic plaque results in elevated peak systolic velocities with the right internal carotid artery compatible with  the lower end of the 50-69% luminal narrowing range. Further evaluation with CTA could be performed as clinically indicated. 2. Post left-sided carotid CEA without evidence of a residual or recurrent hemodynamically significant narrowing within the left internal carotid artery however note is made of a moderate amount of focal eccentric hypoechoic plaque within the left carotid bulb. Again, this could be further evaluated with CTA as clinically indicated. Electronically Signed   By: Sandi Mariscal M.D.   On: 02/14/2020 07:50   ECHOCARDIOGRAM COMPLETE  Result Date: 02/14/2020     ECHOCARDIOGRAM REPORT   Patient Name:   Tommy [name] Marker Date of Exam: 02/14/2020 Medical Rec #:  [id number]         Height:       70.0 in Accession #:    [id number]        Weight:       155.0 lb Date of Birth:  31-Oct-1926         BSA:          1.873 m Patient Age:    33 years          BP:           137/53 mmHg Patient Gender: M                 HR:           63 bpm. Exam Location:  [facility] Procedure: 2D Echo, Cardiac Doppler, Color Doppler and Strain Analysis Indications:     Syncope 780.2  History:         Patient has prior history of Echocardiogram examinations, most                  recent 02/06/2017. CAD; Risk Factors:Hypertension and Diabetes.                  Pulmonary embolism.  Sonographer:     Sherrie Sport RDCS (AE) Referring Phys:  [id number] Athena Masse Diagnosing Phys: Yolonda Kida MD  Sonographer Comments: Global longitudinal strain was attempted. IMPRESSIONS  1. Left ventricular ejection fraction, by estimation, is 60 to 65%. The left ventricle has normal function. The left ventricle has no regional wall motion abnormalities. Left ventricular diastolic parameters are consistent with Grade III diastolic dysfunction (restrictive).  2. Right ventricular systolic function is hyperdynamic. The right ventricular size is normal.  3. The mitral valve is grossly normal. No evidence of mitral valve regurgitation.  4. The aortic valve is grossly normal. Aortic valve regurgitation is not visualized. FINDINGS  Left Ventricle: Left ventricular ejection fraction, by estimation, is 60 to 65%. The left ventricle has normal function. The left ventricle has no regional wall motion abnormalities. The left ventricular internal cavity size was normal in size. There is  no left ventricular hypertrophy. Left ventricular diastolic parameters are consistent with Grade III diastolic dysfunction (restrictive). Right Ventricle: The right ventricular size is normal. No increase in right ventricular wall thickness. Right ventricular  systolic function is hyperdynamic. Left Atrium: Left atrial size was normal in size. Right Atrium: Right atrial size was normal in size. Pericardium: There is no evidence of pericardial effusion. Mitral Valve: The mitral valve is grossly normal. No evidence of mitral valve regurgitation. Tricuspid Valve: The tricuspid valve is grossly normal. Tricuspid valve regurgitation is not demonstrated. Aortic Valve: The aortic valve is grossly normal. Aortic valve regurgitation is not visualized. Aortic valve mean gradient measures 3.0 mmHg. Aortic valve peak gradient measures 6.1 mmHg. Aortic valve area,  by VTI measures 3.61 cm. Pulmonic Valve: The pulmonic valve was normal in structure. Pulmonic valve regurgitation is not visualized. Aorta: The aortic arch was not well visualized. IAS/Shunts: No atrial level shunt detected by color flow Doppler.  LEFT VENTRICLE PLAX 2D LVIDd:         3.16 cm  Diastology LVIDs:         2.16 cm  LV e' medial:    4.57 cm/s LV PW:         1.71 cm  LV E/e' medial:  12.6 LV IVS:        1.11 cm  LV e' lateral:   6.53 cm/s LVOT diam:     2.10 cm  LV E/e' lateral: 8.8 LV SV:         88 LV SV Index:   47 LVOT Area:     3.46 cm                          3D Volume EF:                         3D EF:        57 %                         LV EDV:       104 ml                         LV ESV:       45 ml                         LV SV:        59 ml RIGHT VENTRICLE RV Basal diam:  2.89 cm RV S prime:     10.60 cm/s TAPSE (M-mode): 4.0 cm LEFT ATRIUM             Index       RIGHT ATRIUM           Index LA diam:        4.70 cm 2.51 cm/m  RA Area:     15.90 cm LA Vol (A2C):   40.7 ml 21.73 ml/m RA Volume:   34.60 ml  18.47 ml/m LA Vol (A4C):   54.8 ml 29.26 ml/m LA Biplane Vol: 49.7 ml 26.53 ml/m  AORTIC VALVE                   PULMONIC VALVE AV Area (Vmax):    3.07 cm    PV Vmax:        0.82 m/s AV Area (Vmean):   3.38 cm    PV Peak grad:   2.7 mmHg AV Area (VTI):     3.61 cm    RVOT Peak grad: 5 mmHg  AV Vmax:           123.00 cm/s AV Vmean:          78.400 cm/s AV VTI:            0.244 m AV Peak Grad:      6.1 mmHg AV Mean Grad:      3.0 mmHg LVOT Vmax:         109.00 cm/s LVOT Vmean:        76.600 cm/s LVOT VTI:  0.254 m LVOT/AV VTI ratio: 1.04  AORTA Ao Root diam: 3.00 cm MITRAL VALVE                TRICUSPID VALVE MV Area (PHT): 3.07 cm     TR Peak grad:   25.8 mmHg MV Decel Time: 247 msec     TR Vmax:        254.00 cm/s MV E velocity: 57.40 cm/s MV A velocity: 109.00 cm/s  SHUNTS MV E/A ratio:  0.53         Systemic VTI:  0.25 m                             Systemic Diam: 2.10 cm Dwayne Prince Rome MD Electronically signed by Yolonda Kida MD Signature Date/Time: 02/14/2020/6:23:43 PM    Final     Microbiology: Recent Results (from the past 240 hour(s))  Resp Panel by RT-PCR (Flu A&B, Covid) Nasopharyngeal Swab     Status: None   Collection Time: 02/13/20  8:02 PM   Specimen: Nasopharyngeal Swab; Nasopharyngeal(NP) swabs in vial transport medium  Result Value Ref Range Status   SARS Coronavirus 2 by RT PCR NEGATIVE NEGATIVE Final    Comment: (NOTE) SARS-CoV-2 target nucleic acids are NOT DETECTED.  The SARS-CoV-2 RNA is generally detectable in upper respiratory specimens during the acute phase of infection. The lowest concentration of SARS-CoV-2 viral copies this assay can detect is 138 copies/mL. A negative result does not preclude SARS-Cov-2 infection and should not be used as the sole basis for treatment or other patient management decisions. A negative result may occur with  improper specimen collection/handling, submission of specimen other than nasopharyngeal swab, presence of viral mutation(s) within the areas targeted by this assay, and inadequate number of viral copies(<138 copies/mL). A negative result must be combined with clinical observations, patient history, and epidemiological information. The expected result is Negative.  Fact Sheet for Patients:   EntrepreneurPulse.com.au  Fact Sheet for Healthcare Providers:  IncredibleEmployment.be  This test is no t yet approved or cleared by the [place] FDA and  has been authorized for detection and/or diagnosis of SARS-CoV-2 by FDA under an Emergency Use Authorization (EUA). This EUA will remain  in effect (meaning this test can be used) for the duration of the COVID-19 declaration under Section 564(b)(1) of the Act, 21 U.S.C.section 360bbb-3(b)(1), unless the authorization is terminated  or revoked sooner.       Influenza A by PCR NEGATIVE NEGATIVE Final   Influenza B by PCR NEGATIVE NEGATIVE Final    Comment: (NOTE) The Xpert Xpress SARS-CoV-2/FLU/RSV plus assay is intended as an aid in the diagnosis of influenza from Nasopharyngeal swab specimens and should not be used as a sole basis for treatment. Nasal washings and aspirates are unacceptable for Xpert Xpress SARS-CoV-2/FLU/RSV testing.  Fact Sheet for Patients: EntrepreneurPulse.com.au  Fact Sheet for Healthcare Providers: IncredibleEmployment.be  This test is not yet approved or cleared by the [place] FDA and has been authorized for detection and/or diagnosis of SARS-CoV-2 by FDA under an Emergency Use Authorization (EUA). This EUA will remain in effect (meaning this test can be used) for the duration of the COVID-19 declaration under Section 564(b)(1) of the Act, 21 U.S.C. section 360bbb-3(b)(1), unless the authorization is terminated or revoked.  Performed at [facility], [address] [place], [place] [address]      Labs: Basic Metabolic Panel: Recent Labs  Lab 02/13/20 1557  02/14/20 0422 02/15/20 0341  NA 134* 136 135  K 5.1 4.1 4.3  CL 97* 102 102  CO2 $Re'29 26 25  'oxz$ GLUCOSE 334* 130* 148*  BUN 48* 39* 31*  CREATININE 2.26* 1.73* 1.48*  CALCIUM 9.7 8.9 9.3  MG 2.3  --   --    Liver Function Tests: Recent Labs   Lab 02/13/20 1557  AST 29  ALT 23  ALKPHOS 76  BILITOT 0.8  PROT 7.6  ALBUMIN 3.6   No results for input(s): LIPASE, AMYLASE in the last 168 hours. No results for input(s): AMMONIA in the last 168 hours. CBC: Recent Labs  Lab 02/11/20 1242 02/13/20 1557 02/14/20 0422 02/15/20 0341  WBC 5.7 7.4 7.0 8.6  NEUTROABS 3.9  --   --   --   HGB 10.5* 10.6* 9.4* 10.7*  HCT 30.6* 31.3* 27.1* 30.4*  MCV 102.7* 103.3* 101.1* 103.1*  PLT 178 203 163 179   Cardiac Enzymes: No results for input(s): CKTOTAL, CKMB, CKMBINDEX, TROPONINI in the last 168 hours. BNP: BNP (last 3 results) Recent Labs    02/13/20 1557  BNP 64.7    ProBNP (last 3 results) No results for input(s): PROBNP in the last 8760 hours.  CBG: Recent Labs  Lab 02/14/20 0812 02/14/20 1210 02/14/20 1707 02/14/20 2236 02/15/20 0852  GLUCAP 80 209* 140* 168* 123*       Signed:  Desma Maxim MD.  [facility] 02/15/2020, 10:30 AM

## 2020-02-15 NOTE — ED Notes (Signed)
Patient ambulated to bathroom without difficulty or need for assistance.

## 2020-02-15 NOTE — Consult Note (Signed)
Electrophysiology consultation:   Patient ID: Tommy [name] MRN: [id number]; DOB: [birth year]  Admit date: 02/13/2020 Date of Consult: 02/15/2020  Primary Care Provider: Pleas Koch, NP [facility] Cardiologist: Nelva Bush, MD  [facility] Electrophysiologist:  Vickie Epley, MD    Patient Profile:   Tommy [name] is a 86 y.o. male with a hx of coronary artery disease post remote bypass surgery, PAD, AAA, diabetes, CKD 3, hyperlipidemia, hypertension who is being seen today for the evaluation of dizziness at the request of Dr. Si Raider.  History of Present Illness:   Tommy [name] has a long history of lightheadedness and dizziness.  These episodes occur only when standing.  He tells me this morning that he is never had an episode of lightheadedness when he is seated.  He initially presented on December 12 with increasing burden of lightheadedness and dizziness over the last 2 to 4 weeks.  He has had 2 falls of the last 3 weeks that were due to dizziness.  No loss of consciousness.  No recent changes to his medications.  No feelings of heart palpitations.   Past Medical History:  Diagnosis Date  . Anemia   . Carotid artery occlusion    a. 2004 s/p L carotid endarterectomy; b. 02/2019 U/S: 1-39% bilat ICA stenosis. <50% bilat CCA stenosis.  . CKD (chronic kidney disease) stage 3, GFR 30-59 ml/min (HCC)   . Coronary artery disease    a. 1994 s/p CABG x 3, [place], [place] (LIMA->LAD, VG->dRCA, VG->unknown vessel); b. 02/2016 Cath: LM 99d, LAD 100ost, LCX 100ost, RCA 80ost, diffuse 85-79m. LIMA->LAD ok, VG->RCA ok. VG->unknown vessel 100.  . Diabetes mellitus without complication ([place])   . Dyslipidemia   . GERD (gastroesophageal reflux disease)   . Hemorrhoids   . 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 History of echocardiogram    a. 02/2017 Echo: EF 55-65%, no rwma. Nl DD. Ao sclerosis w/o stenosis. Mod TR. Mild MR.  Marland Kitchen Hypertension   . Pulmonary embolism  ([place])   . Thyroid disease     Past Surgical History:  Procedure Laterality Date  . CAROTID ENDARTERECTOMY Left 2004  . CORONARY ARTERY BYPASS GRAFT  1994   [name]  . TONSILLECTOMY AND ADENOIDECTOMY       Home Medications:  Prior to Admission medications   Medication Sig Start Date End Date Taking? Authorizing Provider  aspirin 81 MG tablet Take 81 mg by mouth at bedtime.   Yes [provider]  atorvastatin (LIPITOR) 40 MG tablet TAKE 1 TABLET BY MOUTH  DAILY Patient taking differently: Take 40 mg by mouth at bedtime. 11/01/19  Yes Pleas Koch, NP  Bacillus Coagulans-Inulin (ALIGN PREBIOTIC-PROBIOTIC PO) Take 1 capsule by mouth daily at 12 noon.   Yes [provider]  betamethasone valerate (VALISONE) 0.1 % cream APPLY TO AFFECTED AREA TWICE A DAY Patient taking differently: Apply 1 application topically 2 (two) times daily. 02/04/20  Yes Pleas Koch, NP  calcium carbonate (OSCAL) 1500 (600 Ca) MG TABS tablet Take 600 mg of elemental calcium by mouth at bedtime.   Yes [provider]  cholecalciferol (VITAMIN D3) 25 MCG (1000 UT) tablet Take 1,000 Units by mouth at bedtime.   Yes [provider]  epoetin alfa (EPOGEN,PROCRIT) 26948 UNIT/ML injection Inject 20,000 Units into the skin as directed. Receives if hemoglobin <10   Yes [provider]  furosemide (LASIX) 40 MG tablet TAKE 1 TABLET BY MOUTH  DAILY Patient taking differently: Take  40 mg by mouth daily. 09/07/19  Yes Pleas Koch, NP  glimepiride (AMARYL) 4 MG tablet TAKE 1 TABLET BY MOUTH  DAILY WITH BREAKFAST Patient taking differently: Take 4 mg by mouth daily with breakfast. 01/04/20  Yes Pleas Koch, NP  Hydrocortisone (PROCTOSOL HC RE) Place 1 Dose rectally as needed.   Yes [provider]  Insulin Glargine (LANTUS) 100 UNIT/ML Solostar Pen Inject 6 Units into the skin at bedtime.   Yes [provider]  IRON PO Take 2 tablets by  mouth 2 (two) times daily.   Yes [provider]  levothyroxine (SYNTHROID) 175 MCG tablet TAKE 1 TABLET BY MOUTH IN  THE MORNING ON AN EMPTY  STOMACH WITH A FULL GLASS  OF WATER Patient taking differently: Take 175 mcg by mouth daily before breakfast. 01/04/20  Yes Pleas Koch, NP  meclizine (ANTIVERT) 12.5 MG tablet Take 1 tablet (12.5 mg total) by mouth 3 (three) times daily as needed for dizziness. 02/10/20  Yes Pleas Koch, NP  Multiple Vitamin (MULTIVITAMIN) capsule Take 1 capsule by mouth daily.   Yes [provider]  nitroGLYCERIN (NITROSTAT) 0.4 MG SL tablet Place 0.4 mg under the tongue every 5 (five) minutes as needed for chest pain.   Yes [provider]  Nutritional Supplements (JUICE PLUS FIBRE PO) Take 1 capsule by mouth daily.   Yes [provider]  Omega-3 Fatty Acids (FISH OIL PO) Take 2 capsules by mouth 2 (two) times daily.   Yes [provider]  pantoprazole (PROTONIX) 20 MG tablet TAKE 1 TABLET BY MOUTH  DAILY Patient taking differently: Take 20 mg by mouth daily. 11/12/19  Yes Pleas Koch, NP  ranolazine (RANEXA) 1000 MG SR tablet Take 1 tablet (1,000 mg total) by mouth 2 (two) times daily. 02/19/19  Yes Theora Gianotti, NP  timolol (TIMOPTIC) 0.5 % ophthalmic solution Place 1 drop into both eyes daily. 03/17/17  Yes [provider]  Blood Glucose Monitoring Suppl (ONETOUCH VERIO) w/Device KIT 1 Device by Does not apply route daily. Use as instructed to test blood sugar daily 04/22/17   Pleas Koch, NP  Insulin Pen Needle 29G X 12.7MM MISC BD Ultrafine Pen Needle Use as instructed to inject insulin daily 02/18/17   Pleas Koch, NP  Lancets [facility] DELICA PLUS [id number]) [place] USE AS INSTRUCT TO TEST BLOOD SUGAR ONCE DAILY 07/05/19   Pleas Koch, NP  Pipeline [facility] VERIO test strip USE AS INSTRUCTED TO TEST BLOOD SUGAR ONCE DAILY 07/26/19   Pleas Koch, NP    Inpatient  Medications: Scheduled Meds: . acidophilus  1 capsule Oral Daily  . aspirin EC  81 mg Oral QHS  . atorvastatin  40 mg Oral QHS  . insulin aspart  0-5 Units Subcutaneous QHS  . insulin aspart  0-9 Units Subcutaneous TID WC  . levothyroxine  175 mcg Oral QAC breakfast  . pantoprazole  20 mg Oral Daily  . sodium chloride flush  3 mL Intravenous Q12H  . timolol  1 drop Both Eyes Daily   Continuous Infusions:  PRN Meds: acetaminophen **OR** acetaminophen, nitroGLYCERIN, ondansetron **OR** ondansetron (ZOFRAN) IV  Allergies:    Allergies  Allergen Reactions  . Heparin Other (See Comments)    "it caused low platelet count and made him have a clot"  . Patiromer Other (See Comments)  . Sodium Zirconium Cyclosilicate Other (See Comments)    Social History:   Social History   Socioeconomic History  .  Marital status: Widowed    Spouse name: Not on file  . Number of children: 4  . Years of education: 16  . Highest education level: Bachelor's degree (e.g., BA, AB, BS)  Occupational History  . Occupation: retired  Tobacco Use  . Smoking status: Former Smoker    Packs/day: 1.50    Years: 25.00    Pack years: 37.50    Types: Cigarettes    Quit date: 1978    Years since quitting: 43.9  . Smokeless tobacco: Never Used  Vaping Use  . Vaping Use: Never used  Substance and Sexual Activity  . Alcohol use: Yes    Alcohol/week: 1.0 standard drink    Types: 1 Glasses of wine per week    Comment: socially   . Drug use: No  . Sexual activity: Not Currently  Other Topics Concern  . Not on file  Social History Narrative   Widower.   4 children, 7 grandchildren, 1 great grandchild.   Retired. Once worked as an account.    Enjoys exercising, spending time with family.    Social Determinants of Health   Financial Resource Strain: Not on file  Food Insecurity: Not on file  Transportation Needs: Not on file  Physical Activity: Not on file  Stress: Not on file  Social Connections:  Not on file  Intimate Partner Violence: Not on file    Family History:    Family History  Problem Relation Age of Onset  . Heart attack Mother   . Heart disease Mother   . Stroke Mother   . Diabetes Father   . Coronary artery disease Sister      ROS:  Please see the history of present illness.   All other ROS reviewed and negative.     Physical Exam/Data:   Vitals:   02/15/20 0330 02/15/20 0345 02/15/20 0400 02/15/20 0415  BP:   (!) 155/60   Pulse: 73 73 70 72  Resp: 10  18   Temp:      TempSrc:      SpO2: 96% 98% 96% 96%  Weight:      Height:       No intake or output data in the 24 hours ending 02/15/20 0823 Last 3 Weights 02/13/2020 02/11/2020 01/24/2020  Weight (lbs) 155 lb 154 lb 14.4 oz 155 lb  Weight (kg) 70.308 kg 70.262 kg 70.308 kg     Body mass index is 22.24 kg/m.   General: Elderly appearing in no acute distress  HEENT: normal Lymph: no adenopathy Neck: no JVD Endocrine:  No thryomegaly Vascular: No carotid bruits; FA pulses 2+ bilaterally without bruits  Cardiac:  normal S1, S2; RRR; no murmur.  Warm.  Nonedematous. Lungs:  clear to auscultation bilaterally, no wheezing, rhonchi or rales  Abd: soft, nontender, no hepatomegaly  Ext: no edema Musculoskeletal:  No deformities, BUE and BLE strength normal and equal Skin: warm and dry  Neuro:  CNs 2-12 intact, no focal abnormalities noted Psych:  Normal affect   EKG:  The EKG was personally reviewed and demonstrates: Right bundle branch block, first-degree AV delay and left anterior fascicular block.  Telemetry:  Telemetry was personally reviewed and demonstrates: Normal heart rate variability.  Bundle-branch block.  First-degree AV delay.  No evidence of AV block.  No evidence of significant bradycardia.  Periods of frequent PVCs.  No sustained ventricular arrhythmias.  Relevant CV Studies:  February 14, 2020 echo personally reviewed Left ventricular function normal, 60% Right ventricular  function normal No significant valvular abnormalities  Laboratory Data:  High Sensitivity Troponin:   Recent Labs  Lab 02/13/20 1557 02/13/20 1921  TROPONINIHS 26* 29*     Chemistry Recent Labs  Lab 02/13/20 1557 02/14/20 0422 02/15/20 0341  NA 134* 136 135  K 5.1 4.1 4.3  CL 97* 102 102  CO2 $Re'29 26 25  'tWC$ GLUCOSE 334* 130* 148*  BUN 48* 39* 31*  CREATININE 2.26* 1.73* 1.48*  CALCIUM 9.7 8.9 9.3  GFRNONAA 27* 37* 44*  ANIONGAP $RemoveB'8 8 8    'gljiOXOK$ Recent Labs  Lab 02/13/20 1557  PROT 7.6  ALBUMIN 3.6  AST 29  ALT 23  ALKPHOS 76  BILITOT 0.8   Hematology Recent Labs  Lab 02/13/20 1557 02/14/20 0422 02/15/20 0341  WBC 7.4 7.0 8.6  RBC 3.03* 2.68* 2.95*  HGB 10.6* 9.4* 10.7*  HCT 31.3* 27.1* 30.4*  MCV 103.3* 101.1* 103.1*  MCH 35.0* 35.1* 36.3*  MCHC 33.9 34.7 35.2  RDW 11.9 12.0 12.0  PLT 203 163 179   BNP Recent Labs  Lab 02/13/20 1557  BNP 64.7    DDimer No results for input(s): DDIMER in the last 168 hours.   Radiology/Studies:  DG Chest 2 View  Result Date: 02/13/2020 CLINICAL DATA:  Dizziness, fall EXAM: CHEST - 2 VIEW COMPARISON:  Radiograph 01/19/2018 FINDINGS: Postsurgical changes related to prior CABG including intact and aligned sternotomy wires and multiple surgical clips projecting over the mediastinum. Stable cardiomediastinal contours. Inferior vena cava filter seen on lateral radiograph. Some coarse reticular opacities are present in the bilateral basilar periphery, unchanged from comparison and likely reflect some chronic scarring. No focal airspace consolidation or convincing features of edema. No pneumothorax or visible effusion. No acute osseous or soft tissue abnormality. Degenerative changes are present in the imaged spine and shoulders. IMPRESSION: 1. No acute cardiopulmonary abnormality. 2. No acute traumatic findings in the chest. 3. Stable chronic scarring in the lung bases. 4. Stable postoperative changes related to prior CABG.  Electronically Signed   By: Lovena Le M.D.   On: 02/13/2020 18:33   CT HEAD WO CONTRAST  Result Date: 02/13/2020 CLINICAL DATA:  86 year old male with history of head trauma from a fall. EXAM: CT HEAD WITHOUT CONTRAST TECHNIQUE: Contiguous axial images were obtained from the base of the skull through the vertex without intravenous contrast. COMPARISON:  No priors. FINDINGS: Brain: Severe cerebral and cerebellar atrophy. Patchy and confluent areas of decreased attenuation are noted throughout the deep and periventricular white matter of the cerebral hemispheres bilaterally, compatible with chronic microvascular ischemic disease. No evidence of acute infarction, hemorrhage, hydrocephalus, extra-axial collection or mass lesion/mass effect. Vascular: No hyperdense vessel or unexpected calcification. Skull: Normal. Negative for fracture or focal lesion. Sinuses/Orbits: No acute finding. Other: None. IMPRESSION: 1. No evidence of significant acute traumatic injury to the skull or brain. 2. Severe cerebral and cerebellar atrophy with chronic microvascular ischemic changes in the cerebral white matter, as above. Electronically Signed   By: Vinnie Langton M.D.   On: 02/13/2020 16:43   CT Cervical Spine Wo Contrast  Result Date: 02/13/2020 CLINICAL DATA:  Fall with posterior head strike, denies loss of consciousness EXAM: CT CERVICAL SPINE WITHOUT CONTRAST TECHNIQUE: Multidetector CT imaging of the cervical spine was performed without intravenous contrast. Multiplanar CT image reconstructions were also generated. COMPARISON:  Contemporary CT head FINDINGS: Alignment: Stabilization collar absent at time of examination. Effacement atlantodental interval with associated arthrosis between the anterior arch C1, dens and basion with extensive  calcific pannus formation, can be seen in the setting of rheumatoid arthropathy or CPPD arthropathy. Otherwise normal alignment of the craniocervical and atlantoaxial  articulations. Straightening of the normal cervical lordosis. Anterolisthesis C3 on 4 of approximately 2 mm, favored to be on a degenerative basis. No evidence of traumatic listhesis. No abnormally widened, perched or jumped facets. Skull base and vertebrae: No acute skull base fracture. No vertebral body fracture or height loss. The osseous structures appear diffusely demineralized which may limit detection of small or nondisplaced fractures. Bony fusions across the posterior elements/facets C2-3, C4-5. Soft tissues and spinal canal: No pre or paravertebral fluid or swelling. No visible canal hematoma. Large calcific pannus formation at C1-2, posterior to the dens. Disc levels: Multilevel intervertebral disc height loss with spondylitic endplate changes. Larger disc osteophyte complexes present C5-6, C6-7 resulting in mild spinal canal stenosis at these levels. Diffuse uncinate spurring and facet hypertrophic changes are present as well resulting in mild-to-moderate multilevel neural foraminal narrowing with more moderate to severe narrowing bilaterally C6-7. Upper chest: No acute abnormality in the upper chest or imaged lung apices. Biapical pleuroparenchymal scarring. Cervical carotid atherosclerosis. Other: Normal thyroid. IMPRESSION: 1. No acute fracture or traumatic listhesis of the cervical spine. 2. Mild-to-moderate multilevel degenerative changes as described above. 3. Effacement atlantodental interval with extensive calcific pannus formation, can be seen in the setting of rheumatoid arthropathy or CPPD arthropathy. 4. Bony fusions across the posterior elements/facets C2-3, C4-5. 5. Cervical carotid atherosclerosis. Electronically Signed   By: Lovena Le M.D.   On: 02/13/2020 19:26   US Carotid Bilateral  Result Date: 02/14/2020 CLINICAL DATA:  Syncopal episode. History of CAD, hypertension, hyperlipidemia and diabetes. History of left-sided CEA in 2004. EXAM: BILATERAL CAROTID DUPLEX ULTRASOUND  TECHNIQUE: Pearline Cables scale imaging, color Doppler and duplex ultrasound were performed of bilateral carotid and vertebral arteries in the neck. COMPARISON:  None. FINDINGS: Criteria: Quantification of carotid stenosis is based on velocity parameters that correlate the residual internal carotid diameter with NASCET-based stenosis levels, using the diameter of the distal internal carotid lumen as the denominator for stenosis measurement. The following velocity measurements were obtained: RIGHT ICA: 132/16 cm/sec CCA: 50/0 cm/sec SYSTOLIC ICA/CCA RATIO:  2.0 ECA: 110 cm/sec LEFT ICA: 91/12 cm/sec CCA: 93/81 cm/sec SYSTOLIC ICA/CCA RATIO:  1.0 ECA: 143 cm/sec RIGHT CAROTID ARTERY: There is a minimal amount of eccentric echogenic plaque scattered throughout the right common carotid artery. There is a moderate amount of eccentric echogenic plaque within the right carotid bulb (image 13 and 16 and 35). There is a moderate amount of eccentric echogenic partially shadowing plaque involving the origin and proximal aspects of the right internal carotid artery (image 46), which results in elevated peak systolic velocities within the mid aspect of the right internal carotid artery. Greatest acquired peak systolic velocity within mid aspect of the right internal carotid artery measures 132 centimeters/second (image 52). RIGHT VERTEBRAL ARTERY:  Antegrade Flow LEFT CAROTID ARTERY: There is a minimal amount of eccentric echogenic plaque scattered throughout the left common carotid artery. There is a minimal amount of intimal thickening/postsurgical change involving the left carotid bulb extending to involve the origin and proximal aspects of the left internal carotid artery. Note is made of a moderate amount of focal eccentric hypoechoic plaque within the left carotid bulb (image 88), not resulting in elevated peak systolic velocities within the interrogated course of the left internal carotid artery. LEFT VERTEBRAL ARTERY:  Antegrade  flow IMPRESSION: 1. Moderate amount of right-sided atherosclerotic plaque results  in elevated peak systolic velocities with the right internal carotid artery compatible with the lower end of the 50-69% luminal narrowing range. Further evaluation with CTA could be performed as clinically indicated. 2. Post left-sided carotid CEA without evidence of a residual or recurrent hemodynamically significant narrowing within the left internal carotid artery however note is made of a moderate amount of focal eccentric hypoechoic plaque within the left carotid bulb. Again, this could be further evaluated with CTA as clinically indicated. Electronically Signed   By: Sandi Mariscal M.D.   On: 02/14/2020 07:50   ECHOCARDIOGRAM COMPLETE  Result Date: 02/14/2020    ECHOCARDIOGRAM REPORT   Patient Name:   Tommy [name] Date of Exam: 02/14/2020 Medical Rec #:  [id number]         Height:       70.0 in Accession #:    [id number]        Weight:       155.0 lb Date of Birth:  26-Sep-1926         BSA:          1.873 m Patient Age:    45 years          BP:           137/53 mmHg Patient Gender: M                 HR:           63 bpm. Exam Location:  [facility] Procedure: 2D Echo, Cardiac Doppler, Color Doppler and Strain Analysis Indications:     Syncope 780.2  History:         Patient has prior history of Echocardiogram examinations, most                  recent 02/06/2017. CAD; Risk Factors:Hypertension and Diabetes.                  Pulmonary embolism.  Sonographer:     Sherrie Sport RDCS (AE) Referring Phys:  [id number] Athena Masse Diagnosing Phys: Yolonda Kida MD  Sonographer Comments: Global longitudinal strain was attempted. IMPRESSIONS  1. Left ventricular ejection fraction, by estimation, is 60 to 65%. The left ventricle has normal function. The left ventricle has no regional wall motion abnormalities. Left ventricular diastolic parameters are consistent with Grade III diastolic dysfunction (restrictive).  2. Right ventricular  systolic function is hyperdynamic. The right ventricular size is normal.  3. The mitral valve is grossly normal. No evidence of mitral valve regurgitation.  4. The aortic valve is grossly normal. Aortic valve regurgitation is not visualized. FINDINGS  Left Ventricle: Left ventricular ejection fraction, by estimation, is 60 to 65%. The left ventricle has normal function. The left ventricle has no regional wall motion abnormalities. The left ventricular internal cavity size was normal in size. There is  no left ventricular hypertrophy. Left ventricular diastolic parameters are consistent with Grade III diastolic dysfunction (restrictive). Right Ventricle: The right ventricular size is normal. No increase in right ventricular wall thickness. Right ventricular systolic function is hyperdynamic. Left Atrium: Left atrial size was normal in size. Right Atrium: Right atrial size was normal in size. Pericardium: There is no evidence of pericardial effusion. Mitral Valve: The mitral valve is grossly normal. No evidence of mitral valve regurgitation. Tricuspid Valve: The tricuspid valve is grossly normal. Tricuspid valve regurgitation is not demonstrated. Aortic Valve: The aortic valve is grossly normal. Aortic valve regurgitation is not visualized. Aortic valve mean gradient measures  3.0 mmHg. Aortic valve peak gradient measures 6.1 mmHg. Aortic valve area, by VTI measures 3.61 cm. Pulmonic Valve: The pulmonic valve was normal in structure. Pulmonic valve regurgitation is not visualized. Aorta: The aortic arch was not well visualized. IAS/Shunts: No atrial level shunt detected by color flow Doppler.  LEFT VENTRICLE PLAX 2D LVIDd:         3.16 cm  Diastology LVIDs:         2.16 cm  LV e' medial:    4.57 cm/s LV PW:         1.71 cm  LV E/e' medial:  12.6 LV IVS:        1.11 cm  LV e' lateral:   6.53 cm/s LVOT diam:     2.10 cm  LV E/e' lateral: 8.8 LV SV:         88 LV SV Index:   47 LVOT Area:     3.46 cm                           3D Volume EF:                         3D EF:        57 %                         LV EDV:       104 ml                         LV ESV:       45 ml                         LV SV:        59 ml RIGHT VENTRICLE RV Basal diam:  2.89 cm RV S prime:     10.60 cm/s TAPSE (M-mode): 4.0 cm LEFT ATRIUM             Index       RIGHT ATRIUM           Index LA diam:        4.70 cm 2.51 cm/m  RA Area:     15.90 cm LA Vol (A2C):   40.7 ml 21.73 ml/m RA Volume:   34.60 ml  18.47 ml/m LA Vol (A4C):   54.8 ml 29.26 ml/m LA Biplane Vol: 49.7 ml 26.53 ml/m  AORTIC VALVE                   PULMONIC VALVE AV Area (Vmax):    3.07 cm    PV Vmax:        0.82 m/s AV Area (Vmean):   3.38 cm    PV Peak grad:   2.7 mmHg AV Area (VTI):     3.61 cm    RVOT Peak grad: 5 mmHg AV Vmax:           123.00 cm/s AV Vmean:          78.400 cm/s AV VTI:            0.244 m AV Peak Grad:      6.1 mmHg AV Mean Grad:      3.0 mmHg LVOT Vmax:         109.00 cm/s LVOT Vmean:  76.600 cm/s LVOT VTI:          0.254 m LVOT/AV VTI ratio: 1.04  AORTA Ao Root diam: 3.00 cm MITRAL VALVE                TRICUSPID VALVE MV Area (PHT): 3.07 cm     TR Peak grad:   25.8 mmHg MV Decel Time: 247 msec     TR Vmax:        254.00 cm/s MV E velocity: 57.40 cm/s MV A velocity: 109.00 cm/s  SHUNTS MV E/A ratio:  0.53         Systemic VTI:  0.25 m                             Systemic Diam: 2.10 cm Tommy Prince Rome MD Electronically signed by Yolonda Kida MD Signature Date/Time: 02/14/2020/6:23:43 PM    Final      Assessment and Plan:   1. Dizziness Tommy [name] is episodes of lightheadedness and dizziness sound most consistent with episodes of orthostatic intolerance.  Their onset is consistently shortly after he stands up.  They never occur while seated.  He does not lose consciousness during the episodes.  While he does have significant conduction disease (first-degree AV delay, right bundle branch block, left anterior fascicular block), I do not have any  evidence of paroxysmal AV block that would explain his symptoms.  I would also expect paroxysmal AV block to present with frank syncope rather than just lightheadedness.  For now, would recommend adjusting his medication regimen to minimize the risks of orthostatic intolerance.  I would recommend placing a 2-week ZIO monitor upon discharge to confirm no paroxysmal AV block.    For questions or updates, please contact [facility] Please consult [email or web address] for contact info under    Signed, Vickie Epley, MD  02/15/2020 8:23 AM

## 2020-02-15 NOTE — Discharge Instructions (Signed)
Near-Syncope Near-syncope is when you suddenly get weak or dizzy, or you feel like you might pass out (faint). This may also be called presyncope. This is due to a lack of blood flow to the brain. During an episode of near-syncope, you may:  Feel dizzy, weak, or light-headed.  Feel sick to your stomach (nauseous).  See all white or all black.  See spots.  Have cold, clammy skin. This condition is caused by a sudden decrease in blood flow to the brain. This decrease can result from various causes, but most of those causes are not dangerous. However, near-syncope may be a sign of a serious medical problem, so it is important to seek medical care. Follow these instructions at home: Medicines  Take over-the-counter and prescription medicines only as told by your doctor.  If you are taking blood pressure or heart medicine, get up slowly and spend many minutes getting ready to sit and then stand. This can help with dizziness. General instructions  Be aware of any changes in your symptoms.  Talk with your doctor about your symptoms. You may need to have testing to find the cause of your near-syncope.  If you start to feel like you might pass out, lie down right away. Raise (elevate) your feet above the level of your heart. Breathe deeply and steadily. Wait until all of the symptoms are gone.  Have someone stay with you until you feel stable.  Do not drive, use machinery, or play sports until your doctor says it is okay.  Drink enough fluid to keep your pee (urine) pale yellow.  Keep all follow-up visits as told by your doctor. This is important. Get help right away if you:  Have a seizure.  Have pain in your: ? Chest. ? Belly (abdomen). ? Back.  Faint once or more than once.  Have a very bad headache.  Are bleeding from your mouth or butt.  Have black or tarry poop (stool).  Have a very fast or uneven heartbeat (palpitations).  Are mixed up (confused).  Have trouble  walking.  Are very weak.  Have trouble seeing. These symptoms may be an emergency. Do not wait to see if the symptoms will go away. Get medical help right away. Call your local emergency services (911 in the [place] Do not drive yourself to the hospital. Summary  Near-syncope is when you suddenly get weak or dizzy, or you feel like you might pass out (faint).  This condition is caused by a lack of blood flow to the brain.  Near-syncope may be a sign of a serious medical problem, so it is important to seek medical care. This information is not intended to replace advice given to you by your health care provider. Make sure you discuss any questions you have with your health care provider. Document Revised: 06/12/2018 Document Reviewed: 01/07/2018 Elsevier Patient Education  2020 Elsevier Inc.  

## 2020-02-16 DIAGNOSIS — I453 Trifascicular block: Secondary | ICD-10-CM | POA: Diagnosis not present

## 2020-02-28 ENCOUNTER — Telehealth: Payer: Self-pay | Admitting: Internal Medicine

## 2020-02-28 MED ORDER — RANOLAZINE ER 1000 MG PO TB12: 1000.0000 mg | ORAL_TABLET | Freq: Two times a day (BID) | ORAL | 3 refills | Status: AC

## 2020-02-28 NOTE — Telephone Encounter (Signed)
Received fax from [facility] requesting refill for Ranexa 1000 mg BID. Rx request sent to pharmacy.

## 2020-03-11 ENCOUNTER — Encounter: Payer: Self-pay | Admitting: Dermatology

## 2020-03-13 ENCOUNTER — Other Ambulatory Visit: Payer: Self-pay | Admitting: Primary Care

## 2020-03-14 ENCOUNTER — Encounter: Payer: Self-pay | Admitting: Gastroenterology

## 2020-03-14 ENCOUNTER — Ambulatory Visit (INDEPENDENT_AMBULATORY_CARE_PROVIDER_SITE_OTHER): Payer: Medicare Other | Admitting: Gastroenterology

## 2020-03-14 VITALS — BP 139/52 | HR 57 | Temp 97.5°F | Ht 70.0 in | Wt 159.1 lb

## 2020-03-14 DIAGNOSIS — K5900 Constipation, unspecified: Secondary | ICD-10-CM | POA: Diagnosis not present

## 2020-03-14 DIAGNOSIS — R159 Full incontinence of feces: Secondary | ICD-10-CM | POA: Diagnosis not present

## 2020-03-14 NOTE — Progress Notes (Signed)
[name], MD [address]  [name]  [place],  [address]  Main: [phone]  Fax: [phone]    Gastroenterology Consultation  Referring Provider:     Pleas Koch, NP Primary Care Physician:  Pleas Koch, NP Primary Gastroenterologist:  Dr. [name] Reason for Consultation:     Fecal incontinence        HPI:   Tommy [name] is a 87 y.o. male referred by Dr. Carlis Abbott, Leticia Penna, NP  for consultation & management of fecal incontinence.  Patient reports that approximately 2 months ago he had a leakage of stool, subsequently a week later, he soiled his bed sheets during the night.  He reports that these 2 episodes happened when he was having diarrhea.  He reports bowel movements are variable consistency, anywhere from constipation to soft stool and sometimes diarrhea.  He takes Metamucil once daily which keeps his bowels soft, he goes once a day.  He did not have any episodes since then.  He denies rectal bleeding, abdominal pain, weight loss, bloating. He does have history of stage III CKD, hemoglobin is 9.8, followed by hematology, receives erythropoietin.  Follow-up visit 03/14/2020 Patient is no longer experiencing fecal incontinence.  He reports having 1 bowel movement daily, continues to take fiber and his bowels are well formed.  He denies any abdominal pain or bloating, rectal bleeding.  NSAIDs: None  Antiplts/Anticoagulants/Anti thrombotics: None  GI Procedures:  Colonoscopy 03/05/2007, reportedly normal  Past Medical History:  Diagnosis Date  . Anemia   . Carotid artery occlusion    a. 2004 s/p L carotid endarterectomy; b. 02/2019 U/S: 1-39% bilat ICA stenosis. <50% bilat CCA stenosis.  . CKD (chronic kidney disease) stage 3, GFR 30-59 ml/min (HCC)   . Coronary artery disease    a. 1994 s/p CABG x 3, [place], [place] (LIMA->LAD, VG->dRCA, VG->unknown vessel); b. 02/2016 Cath: LM 99d, LAD 100ost, LCX 100ost, RCA 80ost, diffuse  85-15m LIMA->LAD ok, VG->RCA ok. VG->unknown vessel 100.  . Diabetes mellitus without complication ([facility]   . Dyslipidemia   . GERD (gastroesophageal reflux disease)   . Hemorrhoids   . 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 History of echocardiogram    a. 02/2017 Echo: EF 55-65%, no rwma. Nl DD. Ao sclerosis w/o stenosis. Mod TR. Mild MR.  .Marland KitchenHypertension   . Pulmonary embolism ([facility]   . Thyroid disease     Past Surgical History:  Procedure Laterality Date  . CAROTID ENDARTERECTOMY Left 2004  . CORONARY ARTERY BYPASS GRAFT  1994   [name]  . TONSILLECTOMY AND ADENOIDECTOMY      Current Outpatient Medications:  .  aspirin 81 MG tablet, Take 81 mg by mouth at bedtime., Disp: , Rfl:  .  atorvastatin (LIPITOR) 40 MG tablet, TAKE 1 TABLET BY MOUTH  DAILY (Patient taking differently: Take 40 mg by mouth at bedtime.), Disp: 90 tablet, Rfl: 3 .  Bacillus Coagulans-Inulin (ALIGN PREBIOTIC-PROBIOTIC PO), Take 1 capsule by mouth daily at 12 noon., Disp: , Rfl:  .  betamethasone valerate (VALISONE) 0.1 % cream, APPLY TO AFFECTED AREA TWICE A DAY (Patient taking differently: Apply 1 application topically 2 (two) times daily.), Disp: 30 g, Rfl: 0 .  Blood Glucose Monitoring Suppl (ONETOUCH VERIO) w/Device KIT, 1 Device by Does not apply route daily. Use as instructed to test blood sugar daily, Disp: 1 kit, Rfl: 0 .  calcium carbonate (OSCAL) 1500 (600 Ca) MG TABS tablet, Take 600 mg  of elemental calcium by mouth at bedtime., Disp: , Rfl:  .  cholecalciferol (VITAMIN D3) 25 MCG (1000 UT) tablet, Take 1,000 Units by mouth at bedtime., Disp: , Rfl:  .  epoetin alfa (EPOGEN,PROCRIT) 74259 UNIT/ML injection, Inject 20,000 Units into the skin as directed. Receives if hemoglobin <10, Disp: , Rfl:  .  glimepiride (AMARYL) 4 MG tablet, TAKE 1 TABLET BY MOUTH  DAILY WITH BREAKFAST (Patient taking differently: Take 4 mg by mouth daily with breakfast.), Disp: 120 tablet, Rfl: 2 .  Hydrocortisone  (PROCTOSOL HC RE), Place 1 Dose rectally as needed., Disp: , Rfl:  .  Insulin Glargine (LANTUS) 100 UNIT/ML Solostar Pen, Inject 6 Units into the skin at bedtime., Disp: , Rfl:  .  Insulin Pen Needle 29G X 12.7MM MISC, BD Ultrafine Pen Needle Use as instructed to inject insulin daily, Disp: 300 each, Rfl: 1 .  IRON PO, Take 2 tablets by mouth 2 (two) times daily., Disp: , Rfl:  .  Lancets (ONETOUCH DELICA PLUS [id number]) MISC, USE AS INSTRUCT TO TEST BLOOD SUGAR ONCE DAILY, Disp: 100 each, Rfl: 5 .  levothyroxine (SYNTHROID) 175 MCG tablet, TAKE 1 TABLET BY MOUTH IN  THE MORNING ON AN EMPTY  STOMACH WITH A FULL GLASS  OF WATER (Patient taking differently: Take 175 mcg by mouth daily before breakfast.), Disp: 120 tablet, Rfl: 2 .  meclizine (ANTIVERT) 12.5 MG tablet, Take 1 tablet (12.5 mg total) by mouth 3 (three) times daily as needed for dizziness., Disp: 30 tablet, Rfl: 0 .  Multiple Vitamin (MULTIVITAMIN) capsule, Take 1 capsule by mouth daily., Disp: , Rfl:  .  nitroGLYCERIN (NITROSTAT) 0.4 MG SL tablet, Place 0.4 mg under the tongue every 5 (five) minutes as needed for chest pain., Disp: , Rfl:  .  Nutritional Supplements (JUICE PLUS FIBRE PO), Take 1 capsule by mouth daily., Disp: , Rfl:  .  Omega-3 Fatty Acids (FISH OIL PO), Take 2 capsules by mouth 2 (two) times daily., Disp: , Rfl:  .  ONETOUCH VERIO test strip, USE AS INSTRUCTED TO TEST BLOOD SUGAR ONCE DAILY, Disp: 100 strip, Rfl: 5 .  pantoprazole (PROTONIX) 20 MG tablet, TAKE 1 TABLET BY MOUTH  DAILY (Patient taking differently: Take 20 mg by mouth daily.), Disp: 120 tablet, Rfl: 2 .  ranolazine (RANEXA) 1000 MG SR tablet, Take 1 tablet (1,000 mg total) by mouth 2 (two) times daily., Disp: 180 tablet, Rfl: 3 .  timolol (TIMOPTIC) 0.5 % ophthalmic solution, Place 1 drop into both eyes daily., Disp: , Rfl: 11   Family History  Problem Relation Age of Onset  . Heart attack Mother   . Heart disease Mother   . Stroke Mother   .  Diabetes Father   . Coronary artery disease Sister      Social History   Tobacco Use  . Smoking status: Former Smoker    Packs/day: 1.50    Years: 25.00    Pack years: 37.50    Types: Cigarettes    Quit date: 1978    Years since quitting: 44.0  . Smokeless tobacco: Never Used  Vaping Use  . Vaping Use: Never used  Substance Use Topics  . Alcohol use: Yes    Alcohol/week: 1.0 standard drink    Types: 1 Glasses of wine per week    Comment: socially   . Drug use: No    Allergies as of 03/14/2020 - Review Complete 03/14/2020  Allergen Reaction Noted  . Heparin Other (See Comments) 12/09/2016  .  Patiromer Other (See Comments) 03/17/2019  . Sodium zirconium cyclosilicate Other (See Comments) 03/17/2019    Review of Systems:    All systems reviewed and negative except where noted in HPI.   Physical Exam:  BP (!) 139/52 (BP Location: Left Arm, Patient Position: Sitting, Cuff Size: Normal)   Pulse (!) 57   Temp (!) 97.5 F (36.4 C) (Oral)   Ht _0  (1.778 m)   Wt 159 lb 2 oz (72.2 kg)   BMI 22.83 kg/m  No LMP for male patient.  General:   Alert,  Well-developed, well-nourished, pleasant and cooperative in NAD Head:  Normocephalic and atraumatic. Eyes:  Sclera clear, no icterus.   Conjunctiva pink. Ears:  Normal auditory acuity. Nose:  No deformity, discharge, or lesions. Mouth:  No deformity or lesions,oropharynx pink & moist. Neck:  Supple; no masses or thyromegaly. Lungs:  Respirations even and unlabored.  Clear throughout to auscultation.   No wheezes, crackles, or rhonchi. No acute distress. Heart:  Regular rate and rhythm; no murmurs, clicks, rubs, or gallops. Abdomen:  Normal bowel sounds. Soft, non-tender and non-distended without masses, hepatosplenomegaly or hernias noted.  No guarding or rebound tenderness.   Rectal: Not performed Msk:  Symmetrical without gross deformities. Good, equal movement & strength bilaterally. Pulses:  Normal pulses  noted. Extremities:  No clubbing or edema.  No cyanosis. Neurologic:  Alert and oriented x3;  grossly normal neurologically. Skin:  Intact without significant lesions or rashes. No jaundice. Psych:  Alert and cooperative. Normal mood and affect.  Imaging Studies: None  Assessment and Plan:   Tommy [name] is a 87 y.o. male with history of CKD stage III, diabetes is seen in consultation for fecal incontinence  Fecal incontinence likely in setting of change in stool consistency: Currently resolved Bowel movements are formed and regular without any rectal bleeding, no other alarm symptoms Do not recommend any further work-up at this time given his age Continue fiber daily   Follow up as needed   [name], MD

## 2020-03-15 ENCOUNTER — Other Ambulatory Visit: Payer: Self-pay

## 2020-03-15 ENCOUNTER — Encounter: Payer: Self-pay | Admitting: Internal Medicine

## 2020-03-15 ENCOUNTER — Telehealth (INDEPENDENT_AMBULATORY_CARE_PROVIDER_SITE_OTHER): Payer: Medicare Other | Admitting: Internal Medicine

## 2020-03-15 VITALS — BP 144/76 | HR 64 | Ht 70.0 in | Wt 159.4 lb

## 2020-03-15 DIAGNOSIS — I453 Trifascicular block: Secondary | ICD-10-CM

## 2020-03-15 DIAGNOSIS — I25118 Atherosclerotic heart disease of native coronary artery with other forms of angina pectoris: Secondary | ICD-10-CM | POA: Diagnosis not present

## 2020-03-15 DIAGNOSIS — I10 Essential (primary) hypertension: Secondary | ICD-10-CM

## 2020-03-15 DIAGNOSIS — R42 Dizziness and giddiness: Secondary | ICD-10-CM | POA: Diagnosis not present

## 2020-03-15 DIAGNOSIS — E785 Hyperlipidemia, unspecified: Secondary | ICD-10-CM

## 2020-03-15 NOTE — Patient Instructions (Signed)
Other Instructions  Dr End recommends you wear an abdominal binder to help with your orthostatic lightheadedness. Sometime you can find them at the local drug store but sometimes you may need to go to a Medical Supplier. Please call: [facility] [address] [place], [place] [address] [phone] phone   Medication Instructions:  Your physician recommends that you continue on your current medications as directed. Please refer to the Current Medication list given to you today.  *If you need a refill on your cardiac medications before your next appointment, please call your pharmacy*  Follow-Up: At [facility], you and your health needs are our priority.  As part of our continuing mission to provide you with exceptional heart care, we have created designated Provider Care Teams.  These Care Teams include your primary Cardiologist (physician) and Advanced Practice Providers (APPs -  Physician Assistants and Nurse Practitioners) who all work together to provide you with the care you need, when you need it.  We recommend signing up for the patient portal called "MyChart".  Sign up information is provided on this After Visit Summary.  MyChart is used to connect with patients for Virtual Visits (Telemedicine).  Patients are able to view lab/test results, encounter notes, upcoming appointments, etc.  Non-urgent messages can be sent to your provider as well.   To learn more about what you can do with MyChart, go to [email or web address].    Your next appointment:   1 month(s)  The format for your next appointment:   In Person  Provider:   You may see Nelva Bush, MD or one of the following Advanced Practice Providers on your designated Care Team:    Murray Hodgkins, NP

## 2020-03-15 NOTE — Progress Notes (Signed)
Virtual Visit via Video Note   This visit type was conducted due to national recommendations for restrictions regarding the COVID-19 Pandemic (e.g. social distancing) in an effort to limit this patient's exposure and mitigate transmission in our community.  Due to his co-morbid illnesses, this patient is at least at moderate risk for complications without adequate follow up.  This format is felt to be most appropriate for this patient at this time.  All issues noted in this document were discussed and addressed.  A limited physical exam was performed with this format.  Please refer to the patient's chart for his consent to telehealth for [facility].       Date:  03/15/2020   ID:  Tommy [name], DOB 01/25/1927, MRN [id number] The patient was identified using 2 identifiers.  Patient Location: Home Provider Location: Office/Clinic  PCP:  Pleas Koch, NP  Cardiologist:  Nelva Bush, MD  Electrophysiologist:  Vickie Epley, MD   Evaluation Performed:  Follow-Up Visit  Chief Complaint:  Follow-up lightheadedness and fall  History of Present Illness:    Tommy [name] is a 87 y.o. male with history of coronary artery disease status post remote CABG with stable angina, PAD, carotid artery stenosis status post left CEA, hypertension, hyperlipidemia, type 2 diabetes mellitus,CKD, and chronic anemia, who presents for follow-up of CAD, PAD, and dizziness with underlying conduction disease.  He was hospitalized at [facility] last month after falling.  He has been experiencing intermittent dizziness and orthostatic lightheadedness for the last 6 to 8 months. He was evaluated during his hospitalization by Dr. Quentin Ore (EP), who recommended ambulatory cardiac monitoring and titration of his medications to minimize risk for orthostatic lightheadedness.  He was discharged with a 14-day event monitor that showed rare PACs and PVCs as well as a few episodes of sinus bradycardia and  junctional rhythm with ventricular rates as low as 31 bpm.  Brief episodes of PSVT and SVT were also noted.  Today, [name] continues to complain of intermittent orthostatic lightheadedness, though he has not fallen or passed out.  He is trying to drink plenty of fluids.  He reports one episode of substernal chest pain that woke him up from sleep while he was wearing the event monitor.  The pain resolved promptly with a single sublingual nitroglycerin table.  He feels like he may be getting out of breath a little faster than in the past when he exerts himself, though he continues to walk on the treadmill on a regular basis.  The patient does not have symptoms concerning for COVID-19 infection (fever, chills, cough, or new shortness of breath).  His son recently tested positive for COVID-19 x 2, though he had only mild symptoms that have since resolved.   Past Medical History:  Diagnosis Date  . Anemia   . Carotid artery occlusion    a. 2004 s/p L carotid endarterectomy; b. 02/2019 U/S: 1-39% bilat ICA stenosis. <50% bilat CCA stenosis.  . CKD (chronic kidney disease) stage 3, GFR 30-59 ml/min (HCC)   . Coronary artery disease    a. 1994 s/p CABG x 3, [place], [place] (LIMA->LAD, VG->dRCA, VG->unknown vessel); b. 02/2016 Cath: LM 99d, LAD 100ost, LCX 100ost, RCA 80ost, diffuse 85-66m LIMA->LAD ok, VG->RCA ok. VG->unknown vessel 100.  . Diabetes [place] without complication ([place]   . Dyslipidemia   . GERD (gastroesophageal reflux disease)   . Hemorrhoids   . 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 History of echocardiogram  a. 02/2017 Echo: EF 55-65%, no rwma. Nl DD. Ao sclerosis w/o stenosis. Mod TR. Mild MR.  Marland Kitchen Hypertension   . Pulmonary embolism ([place])   . Thyroid disease    Past Surgical History:  Procedure Laterality Date  . CAROTID ENDARTERECTOMY Left 2004  . CORONARY ARTERY BYPASS GRAFT  1994   [name]  . TONSILLECTOMY AND ADENOIDECTOMY       Current Meds   Medication Sig  . aspirin 81 MG tablet Take 81 mg by mouth at bedtime.  Marland Kitchen atorvastatin (LIPITOR) 40 MG tablet TAKE 1 TABLET BY MOUTH  DAILY (Patient taking differently: Take 40 mg by mouth at bedtime.)  . Bacillus Coagulans-Inulin (ALIGN PREBIOTIC-PROBIOTIC PO) Take 1 capsule by mouth daily at 12 noon.  . betamethasone valerate (VALISONE) 0.1 % cream APPLY TO AFFECTED AREA TWICE A DAY (Patient taking differently: Apply 1 application topically 2 (two) times daily.)  . Blood Glucose Monitoring Suppl (ONETOUCH VERIO) w/Device KIT 1 Device by Does not apply route daily. Use as instructed to test blood sugar daily  . calcium carbonate (OSCAL) 1500 (600 Ca) MG TABS tablet Take 600 mg of elemental calcium by mouth at bedtime.  . cholecalciferol (VITAMIN D3) 25 MCG (1000 UT) tablet Take 1,000 Units by mouth at bedtime.  Marland Kitchen epoetin alfa (EPOGEN,PROCRIT) 37169 UNIT/ML injection Inject 20,000 Units into the skin as directed. Receives if hemoglobin <10  . glimepiride (AMARYL) 4 MG tablet TAKE 1 TABLET BY MOUTH  DAILY WITH BREAKFAST (Patient taking differently: Take 4 mg by mouth daily with breakfast.)  . Hydrocortisone (PROCTOSOL HC RE) Place 1 Dose rectally as needed.  . Insulin Glargine (LANTUS) 100 UNIT/ML Solostar Pen Inject 6 Units into the skin at bedtime.  . Insulin Pen Needle 29G X 12.7MM MISC BD Ultrafine Pen Needle Use as instructed to inject insulin daily  . IRON PO Take 2 tablets by mouth 2 (two) times daily.  . Lancets (ONETOUCH DELICA PLUS [id number]) MISC USE AS INSTRUCT TO TEST BLOOD SUGAR ONCE DAILY  . levothyroxine (SYNTHROID) 175 MCG tablet TAKE 1 TABLET BY MOUTH IN  THE MORNING ON AN EMPTY  STOMACH WITH A FULL GLASS  OF WATER (Patient taking differently: Take 175 mcg by mouth daily before breakfast.)  . meclizine (ANTIVERT) 12.5 MG tablet Take 1 tablet (12.5 mg total) by mouth 3 (three) times daily as needed for dizziness.  . Multiple Vitamin (MULTIVITAMIN) capsule Take 1 capsule by mouth  daily.  . nitroGLYCERIN (NITROSTAT) 0.4 MG SL tablet Place 0.4 mg under the tongue every 5 (five) minutes as needed for chest pain.  . Nutritional Supplements (JUICE PLUS FIBRE PO) Take 1 capsule by mouth daily.  . Omega-3 Fatty Acids (FISH OIL PO) Take 2 capsules by mouth 2 (two) times daily.  Glory Rosebush VERIO test strip USE AS INSTRUCTED TO TEST BLOOD SUGAR ONCE DAILY  . pantoprazole (PROTONIX) 20 MG tablet TAKE 1 TABLET BY MOUTH  DAILY (Patient taking differently: Take 20 mg by mouth daily.)  . ranolazine (RANEXA) 1000 MG SR tablet Take 1 tablet (1,000 mg total) by mouth 2 (two) times daily.  . timolol (TIMOPTIC) 0.5 % ophthalmic solution Place 1 drop into both eyes daily.     Allergies:   Heparin, Patiromer, and Sodium zirconium cyclosilicate   Social History   Tobacco Use  . Smoking status: Former Smoker    Packs/day: 1.50    Years: 25.00    Pack years: 37.50    Types: Cigarettes    Quit date:  1978    Years since quitting: 44.0  . Smokeless tobacco: Never Used  Vaping Use  . Vaping Use: Never used  Substance Use Topics  . Alcohol use: Yes    Alcohol/week: 1.0 standard drink    Types: 1 Glasses of wine per week    Comment: socially   . Drug use: No     Family Hx: The patient's family history includes Coronary artery disease in his sister; Diabetes in his father; Heart attack in his mother; Heart disease in his mother; Stroke in his mother.  ROS:   Please see the history of present illness.   All other systems reviewed and are negative.   Prior CV studies:   The following studies were reviewed today:  TTE (02/14/2020): Normal LV size and wall thickness with LVEF 60-65% and grade 3 diastolic dysfunction. Normal RV size and function. No significant valvular abnormality.  14-day event monitor (03/13/2020): Predominantly sinus rhythm with rare PAC's and PVC's, as well as brief runs of NSVT and PSVT.  Brief episodes of junctional rhythm also noted.  Labs/Other Tests and  Data Reviewed:    EKG:  No ECG reviewed.  Recent Labs: 02/13/2020: ALT 23; B Natriuretic Peptide 64.7; Magnesium 2.3 02/14/2020: TSH 1.639 02/15/2020: BUN 31; Creatinine, Ser 1.48; Hemoglobin 10.7; Platelets 179; Potassium 4.3; Sodium 135   Recent Lipid Panel Lab Results  Component Value Date/Time   CHOL 129 04/20/2019 02:49 PM   TRIG 176.0 (H) 04/20/2019 02:49 PM   HDL 44.50 04/20/2019 02:49 PM   CHOLHDL 3 04/20/2019 02:49 PM   LDLCALC 50 04/20/2019 02:49 PM    Wt Readings from Last 3 Encounters:  03/15/20 159 lb 6.4 oz (72.3 kg)  03/14/20 159 lb 2 oz (72.2 kg)  02/13/20 155 lb (70.3 kg)     Risk Assessment/Calculations:      Objective:    Vital Signs:  BP (!) 144/76   Pulse 64   Ht _0  (1.778 m)   Wt 159 lb 6.4 oz (72.3 kg) Comment: yesterday  BMI 22.87 kg/m    VITAL SIGNS:  reviewed GEN:  no acute distress  ASSESSMENT & PLAN:    Coronary artery disease with stable angina: Mr. Schreier continues to have sporadic episodes of nocturnal chest pain that resolve promptly with SL NTG.  Frequency of the events has been stable, though his exertional dyspnea may have progressed slightly.  In spite of this, he continues to walk on the treadmill most days of the week.  We again discussed further ischemia evaluation but have agreed to defer this in light of his comorbidities.  Continue secondary prevention with aspirin and statin therapy, as well as antianginal therapy with ranolazine.  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  Event monitor showed a few episodes of PSVT and NSVT as well as brief periods of marked sinus bradycardia and junction rhythm.  Mr. Daisey is not on any rate or blood pressure lowering medications at this time.  I have encouraged him to stay well-hydrated.  We will also help him procure an abdominal binder (he already wears compression stockings on a regular basis).   I will forward the results of his event monitor to Dr. Quentin Ore (EP), who evaluated Mr. Beller during his recent hospitalization.  Hypertension: Blood pressure mildly elevated today but somewhat labile at home.  In the setting of orthostatic lightheadedness, we will tolerate a small degree of permissive hypertension.  No medication  changes to be made today.  Hyperlipidemia: LDL at goal.  Continue atorvastatin.  COVID-19 Education: The signs and symptoms of COVID-19 were discussed with the patient and how to seek care for testing (follow up with PCP or arrange E-visit).    Time:   Today, I have spent 15 minutes with the patient with telehealth technology discussing the above problems.     Medication Adjustments/Labs and Tests Ordered: Current medicines are reviewed at length with the patient today.  Concerns regarding medicines are outlined above.   Tests Ordered: None.  Medication Changes: None.  Follow Up:  In Person in 1 month(s)  Signed, Nelva Bush, MD  03/15/2020 2:14 PM    [facility]

## 2020-03-15 NOTE — Telephone Encounter (Signed)
No answer. Left message with process that someone will call him about 15 minutes prior to the appointment to go over the process and to please reply to the consent MyChart message.

## 2020-03-22 ENCOUNTER — Other Ambulatory Visit: Payer: Self-pay | Admitting: Primary Care

## 2020-03-23 ENCOUNTER — Other Ambulatory Visit: Payer: Self-pay

## 2020-03-23 DIAGNOSIS — D631 Anemia in chronic kidney disease: Secondary | ICD-10-CM

## 2020-03-23 DIAGNOSIS — N183 Chronic kidney disease, stage 3 unspecified: Secondary | ICD-10-CM

## 2020-03-24 ENCOUNTER — Inpatient Hospital Stay: Payer: Medicare Other | Attending: Oncology

## 2020-03-24 ENCOUNTER — Other Ambulatory Visit: Payer: Self-pay

## 2020-03-24 ENCOUNTER — Inpatient Hospital Stay: Payer: Medicare Other

## 2020-03-24 VITALS — BP 171/62 | HR 67

## 2020-03-24 DIAGNOSIS — N183 Chronic kidney disease, stage 3 unspecified: Secondary | ICD-10-CM

## 2020-03-24 DIAGNOSIS — Z79899 Other long term (current) drug therapy: Secondary | ICD-10-CM | POA: Diagnosis not present

## 2020-03-24 DIAGNOSIS — D631 Anemia in chronic kidney disease: Secondary | ICD-10-CM

## 2020-03-24 DIAGNOSIS — I129 Hypertensive chronic kidney disease with stage 1 through stage 4 chronic kidney disease, or unspecified chronic kidney disease: Secondary | ICD-10-CM | POA: Insufficient documentation

## 2020-03-24 LAB — HEMATOCRIT: HCT: 29 % — ABNORMAL LOW (ref 39.0–52.0)

## 2020-03-24 LAB — HEMOGLOBIN: Hemoglobin: 9.9 g/dL — ABNORMAL LOW (ref 13.0–17.0)

## 2020-03-24 MED ORDER — EPOETIN ALFA-EPBX 10000 UNIT/ML IJ SOLN
20000.0000 [IU] | Freq: Once | INTRAMUSCULAR | Status: AC
  Administered 2020-03-24: 20000 [IU] via SUBCUTANEOUS
  Filled 2020-03-24: qty 2

## 2020-03-27 DIAGNOSIS — E119 Type 2 diabetes mellitus without complications: Secondary | ICD-10-CM

## 2020-03-27 DIAGNOSIS — Z794 Long term (current) use of insulin: Secondary | ICD-10-CM

## 2020-03-28 ENCOUNTER — Other Ambulatory Visit: Payer: Self-pay | Admitting: Primary Care

## 2020-03-28 DIAGNOSIS — I8393 Asymptomatic varicose veins of bilateral lower extremities: Secondary | ICD-10-CM

## 2020-03-28 LAB — HM DIABETES EYE EXAM

## 2020-03-29 NOTE — Telephone Encounter (Signed)
Pharmacy requests refill on: Betamethasone Valerate 0.1% Cream   LAST REFILL: 02/04/2020 (Q-30 g, R-0) LAST OV: 01/24/2020 NEXT OV: 04/03/2020 PHARMACY: [id number] [place], [place]

## 2020-03-30 ENCOUNTER — Encounter: Payer: Self-pay | Admitting: Primary Care

## 2020-03-30 NOTE — Telephone Encounter (Signed)
Requested Prescriptions   Signed Prescriptions Disp Refills  . betamethasone valerate (VALISONE) 0.1 % cream 30 g 0    Sig: APPLY TO AFFECTED AREA TWICE A DAY    Authorizing Provider: Pleas Koch   Refill(s) sent to pharmacy.

## 2020-03-30 NOTE — Telephone Encounter (Signed)
Can you print this off and fax for me?

## 2020-03-31 MED ORDER — ONETOUCH VERIO W/DEVICE KIT: 1.0000 | PACK | Freq: Every day | 0 refills | Status: AC

## 2020-03-31 NOTE — Telephone Encounter (Signed)
Script printed and faxed.

## 2020-04-03 ENCOUNTER — Other Ambulatory Visit: Payer: Self-pay

## 2020-04-03 ENCOUNTER — Encounter: Payer: Self-pay | Admitting: Primary Care

## 2020-04-03 ENCOUNTER — Ambulatory Visit (INDEPENDENT_AMBULATORY_CARE_PROVIDER_SITE_OTHER): Payer: Medicare Other | Admitting: Primary Care

## 2020-04-03 VITALS — BP 124/76 | HR 60 | Temp 98.4°F | Ht 70.0 in | Wt 159.8 lb

## 2020-04-03 DIAGNOSIS — E119 Type 2 diabetes mellitus without complications: Secondary | ICD-10-CM

## 2020-04-03 DIAGNOSIS — I8393 Asymptomatic varicose veins of bilateral lower extremities: Secondary | ICD-10-CM

## 2020-04-03 DIAGNOSIS — I25118 Atherosclerotic heart disease of native coronary artery with other forms of angina pectoris: Secondary | ICD-10-CM | POA: Diagnosis not present

## 2020-04-03 DIAGNOSIS — E039 Hypothyroidism, unspecified: Secondary | ICD-10-CM

## 2020-04-03 DIAGNOSIS — Z794 Long term (current) use of insulin: Secondary | ICD-10-CM

## 2020-04-03 DIAGNOSIS — I10 Essential (primary) hypertension: Secondary | ICD-10-CM | POA: Diagnosis not present

## 2020-04-03 DIAGNOSIS — I6523 Occlusion and stenosis of bilateral carotid arteries: Secondary | ICD-10-CM

## 2020-04-03 DIAGNOSIS — K219 Gastro-esophageal reflux disease without esophagitis: Secondary | ICD-10-CM

## 2020-04-03 DIAGNOSIS — Z0000 Encounter for general adult medical examination without abnormal findings: Secondary | ICD-10-CM

## 2020-04-03 DIAGNOSIS — D631 Anemia in chronic kidney disease: Secondary | ICD-10-CM

## 2020-04-03 DIAGNOSIS — R42 Dizziness and giddiness: Secondary | ICD-10-CM

## 2020-04-03 DIAGNOSIS — N1832 Chronic kidney disease, stage 3b: Secondary | ICD-10-CM

## 2020-04-03 DIAGNOSIS — E785 Hyperlipidemia, unspecified: Secondary | ICD-10-CM

## 2020-04-03 NOTE — Assessment & Plan Note (Signed)
Doing well on pantoprazole 20 mg, continue same daily.

## 2020-04-03 NOTE — Assessment & Plan Note (Signed)
Following with cardiology, last visit being a few weeks ago virtually.  No changes needed, plan is to treat with secondary prevention.  Continue Ranexa 1000 mg, aspirin 81 mg, atorvastatin 40 mg.

## 2020-04-03 NOTE — Assessment & Plan Note (Signed)
Doing well on betamethasone as needed, continue same.

## 2020-04-03 NOTE — Assessment & Plan Note (Signed)
Well-controlled in the office today, continue off of antihypertensive treatment.

## 2020-04-03 NOTE — Assessment & Plan Note (Signed)
Following with nephrology, recent labs evaluated on care everywhere.  Continue Epogen, blood pressure control, diabetes control.

## 2020-04-03 NOTE — Assessment & Plan Note (Signed)
Following with nephrology, continue as needed Epogen.  Recent CBC reviewed in care everywhere, stable.

## 2020-04-03 NOTE — Assessment & Plan Note (Signed)
Hospitalization in December 2021 for falls secondary to orthostatic hypotension.  He is working to stay hydrated. Continue off of antihypertensive treatment.

## 2020-04-03 NOTE — Assessment & Plan Note (Signed)
Immunizations up-to-date. Colonoscopy and prostate screening not applicable given age.  Encouraged healthy diet, continue regular exercise. Exam today stable. Labs reviewed from our system and also care everywhere.

## 2020-04-03 NOTE — Assessment & Plan Note (Signed)
History of endarterectomy, moderate stenosis to right according to recent Dopplers.  No new intervention required at this time.  Continue statin and aspirin therapies.

## 2020-04-03 NOTE — Progress Notes (Signed)
Subjective:    Patient ID: Tommy [name], male    DOB: [birth year], 87 y.o.   MRN: [id number]  HPI  This visit occurred during the SARS-CoV-2 public health emergency.  Safety protocols were in place, including screening questions prior to the visit, additional usage of staff PPE, and extensive cleaning of exam room while observing appropriate contact time as indicated for disinfecting solutions.   Tommy [name] is a 87 year old male who presents today for complete physical.  Immunizations: -Tetanus: 2018 -Influenza: Completed this season  -Shingles: Completed series -Pneumonia: Prevnar in 2017, Pneumovax in 2019 -Covid-19: Completed 3 vaccines.  Diet: He endorses a healthy diet.  Exercise: He is exercising 3 days weekly at the gym  Eye exam: UTD Dental exam: Completes semi-annually   Colonoscopy: N/A due to age Hep C Screen: Negative  BP Readings from Last 3 Encounters:  04/03/20 124/76  03/24/20 (!) 171/62  03/15/20 (!) 144/76     Review of Systems  Constitutional: Negative for unexpected weight change.  HENT: Negative for rhinorrhea.   Eyes: Negative for visual disturbance.  Respiratory: Negative for cough and shortness of breath.   Cardiovascular: Negative for chest pain.  Gastrointestinal: Negative for constipation and diarrhea.  Genitourinary: Negative for difficulty urinating.  Musculoskeletal: Negative for arthralgias and myalgias.  Skin: Negative for rash.  Allergic/Immunologic: Negative for environmental allergies.  Neurological: Negative for dizziness, numbness and headaches.  Psychiatric/Behavioral: The patient is not nervous/anxious.        Past Medical History:  Diagnosis Date  . Anemia   . Carotid artery occlusion    a. 2004 s/p L carotid endarterectomy; b. 02/2019 [place]: 1-39% bilat ICA stenosis. <50% bilat CCA stenosis.  . CKD (chronic kidney disease) stage 3, GFR 30-59 ml/min (HCC)   . Coronary artery disease    a. 1994 s/p CABG x 3,  [place] (LIMA->LAD, VG->dRCA, VG->unknown vessel); b. 02/2016 Cath: LM 99d, LAD 100ost, LCX 100ost, RCA 80ost, diffuse 85-60m LIMA->LAD ok, VG->RCA ok. VG->unknown vessel 100.  . Diabetes mellitus without complication ([facility]   . Dyslipidemia   . GERD (gastroesophageal reflux disease)   . Hemorrhoids   . 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 History of echocardiogram    a. 02/2017 Echo: EF 55-65%, no rwma. Nl DD. Ao sclerosis w/o stenosis. Mod TR. Mild MR.  .Marland KitchenHypertension   . Pulmonary embolism ([facility]   . Thyroid disease      Social History   Socioeconomic History  . Marital status: Widowed    Spouse name: Not on file  . Number of children: 4  . Years of education: 16  . Highest education level: Bachelor's degree (e.g., BA, AB, BS)  Occupational History  . Occupation: retired  Tobacco Use  . Smoking status: Former Smoker    Packs/day: 1.50    Years: 25.00    Pack years: 37.50    Types: Cigarettes    Quit date: 1978    Years since quitting: 44.1  . Smokeless tobacco: Never Used  Vaping Use  . Vaping Use: Never used  Substance and Sexual Activity  . Alcohol use: Yes    Alcohol/week: 1.0 standard drink    Types: 1 Glasses of wine per week    Comment: socially   . Drug use: No  . Sexual activity: Not Currently  Other Topics Concern  . Not on file  Social History Narrative   Widower.   4 children, 7 grandchildren, 1 great grandchild.   Retired. Once  worked as an account.    Enjoys exercising, spending time with family.    Social Determinants of Health   Financial Resource Strain: Not on file  Food Insecurity: Not on file  Transportation Needs: Not on file  Physical Activity: Not on file  Stress: Not on file  Social Connections: Not on file  Intimate Partner Violence: Not on file    Past Surgical History:  Procedure Laterality Date  . CAROTID ENDARTERECTOMY Left 2004  . CORONARY ARTERY BYPASS GRAFT  1994   [name]  . TONSILLECTOMY AND  ADENOIDECTOMY      Family History  Problem Relation Age of Onset  . Heart attack Mother   . Heart disease Mother   . Stroke Mother   . Diabetes Father   . Coronary artery disease Sister     Allergies  Allergen Reactions  . Heparin Other (See Comments)    "it caused low platelet count and made him have a clot"  . Patiromer Other (See Comments)  . Sodium Zirconium Cyclosilicate Other (See Comments)    Current Outpatient Medications on File Prior to Visit  Medication Sig Dispense Refill  . aspirin 81 MG tablet Take 81 mg by mouth at bedtime.    Marland Kitchen atorvastatin (LIPITOR) 40 MG tablet TAKE 1 TABLET BY MOUTH  DAILY (Patient taking differently: Take 40 mg by mouth at bedtime.) 90 tablet 3  . B-D ULTRAFINE III SHORT PEN 31G X 8 MM MISC USE AS INSTRUCTED TO INJECT INSULIN DAILY. 100 each 5  . Bacillus Coagulans-Inulin (ALIGN PREBIOTIC-PROBIOTIC PO) Take 1 capsule by mouth daily at 12 noon.    . betamethasone valerate (VALISONE) 0.1 % cream APPLY TO AFFECTED AREA TWICE A DAY 30 g 0  . Blood Glucose Monitoring Suppl (ONETOUCH VERIO) w/Device KIT 1 Device by Does not apply route daily. Use as instructed to test blood sugar daily 1 kit 0  . calcium carbonate (OSCAL) 1500 (600 Ca) MG TABS tablet Take 600 mg of elemental calcium by mouth at bedtime.    . cholecalciferol (VITAMIN D3) 25 MCG (1000 UT) tablet Take 1,000 Units by mouth at bedtime.    Marland Kitchen epoetin alfa (EPOGEN,PROCRIT) 18299 UNIT/ML injection Inject 20,000 Units into the skin as directed. Receives if hemoglobin <10    . glimepiride (AMARYL) 4 MG tablet TAKE 1 TABLET BY MOUTH  DAILY WITH BREAKFAST (Patient taking differently: Take 4 mg by mouth daily with breakfast.) 120 tablet 2  . Hydrocortisone (PROCTOSOL HC RE) Place 1 Dose rectally as needed.    . Insulin Glargine (LANTUS) 100 UNIT/ML Solostar Pen Inject 6 Units into the skin at bedtime.    . Insulin Pen Needle 29G X 12.7MM MISC BD Ultrafine Pen Needle Use as instructed to inject  insulin daily 300 each 1  . IRON PO Take 2 tablets by mouth 2 (two) times daily.    . Lancets (ONETOUCH DELICA PLUS [id number]) MISC USE AS INSTRUCT TO TEST BLOOD SUGAR ONCE DAILY 100 each 5  . levothyroxine (SYNTHROID) 175 MCG tablet TAKE 1 TABLET BY MOUTH IN  THE MORNING ON AN EMPTY  STOMACH WITH A FULL GLASS  OF WATER (Patient taking differently: Take 175 mcg by mouth daily before breakfast.) 120 tablet 2  . meclizine (ANTIVERT) 12.5 MG tablet Take 1 tablet (12.5 mg total) by mouth 3 (three) times daily as needed for dizziness. 30 tablet 0  . Multiple Vitamin (MULTIVITAMIN) capsule Take 1 capsule by mouth daily.    . nitroGLYCERIN (NITROSTAT) 0.4 MG  SL tablet Place 0.4 mg under the tongue every 5 (five) minutes as needed for chest pain.    . Nutritional Supplements (JUICE PLUS FIBRE PO) Take 1 capsule by mouth daily.    . Omega-3 Fatty Acids (FISH OIL PO) Take 2 capsules by mouth 2 (two) times daily.    Glory Rosebush VERIO test strip USE AS INSTRUCTED TO TEST BLOOD SUGAR ONCE DAILY 100 strip 5  . pantoprazole (PROTONIX) 20 MG tablet TAKE 1 TABLET BY MOUTH  DAILY (Patient taking differently: Take 20 mg by mouth daily.) 120 tablet 2  . ranolazine (RANEXA) 1000 MG SR tablet Take 1 tablet (1,000 mg total) by mouth 2 (two) times daily. 180 tablet 3  . timolol (TIMOPTIC) 0.5 % ophthalmic solution Place 1 drop into both eyes daily.  11   No current facility-administered medications on file prior to visit.    BP 124/76   Pulse 60   Temp 98.4 F (36.9 C) (Oral)   Ht _0  (1.778 m)   Wt 159 lb 12.8 oz (72.5 kg)   SpO2 98%   BMI 22.93 kg/m    Objective:   Physical Exam Constitutional:      Appearance: He is well-nourished.  HENT:     Right Ear: Tympanic membrane and ear canal normal.     Left Ear: Tympanic membrane and ear canal normal.     Mouth/Throat:     Mouth: Oropharynx is clear and moist.  Eyes:     Extraocular Movements: EOM normal.     Pupils: Pupils are equal, round, and  reactive to light.  Cardiovascular:     Rate and Rhythm: Normal rate and regular rhythm.  Pulmonary:     Effort: Pulmonary effort is normal.     Breath sounds: Normal breath sounds.  Abdominal:     General: Bowel sounds are normal.     Palpations: Abdomen is soft.     Tenderness: There is no abdominal tenderness.  Musculoskeletal:        General: Normal range of motion.     Cervical back: Neck supple.  Skin:    General: Skin is warm and dry.  Neurological:     Mental Status: He is alert and oriented to person, place, and time.     Cranial Nerves: No cranial nerve deficit.     Deep Tendon Reflexes:     Reflex Scores:      Patellar reflexes are 2+ on the right side and 2+ on the left side. Psychiatric:        Mood and Affect: Mood and affect and mood normal.            Assessment & Plan:

## 2020-04-03 NOTE — Assessment & Plan Note (Signed)
A1c of 6.8 during hospitalization in December 2021.  He does continue to use Lantus at 6 units, seems to be stable at this dose.  We have discussed (numerous times in the past) coming off of insulin given his age and comorbidities, he currently declines and would like to continue.  Continue Lantus 6 units nightly.  Continue glimepiride 4 mg daily.  Follow-up in 6 months.

## 2020-04-03 NOTE — Assessment & Plan Note (Signed)
Historically well controlled on atorvastatin 40 mg.  Continue same.  Unfortunately we forgot to check this lab today, will check at next visit.

## 2020-04-03 NOTE — Assessment & Plan Note (Signed)
Recent TSH unremarkable, continue levothyroxine 175 mcg daily.

## 2020-04-03 NOTE — Patient Instructions (Addendum)
Continue to work on a healthy diet.   Monitor your blood sugars and notify me if you see readings consistently below 90.  Continue regular exercise.  Please schedule a follow up appointment in 6 months for diabetes check.   It was a pleasure to see you today!   Preventive Care 60 Years and Older, Male Preventive care refers to lifestyle choices and visits with your health care provider that can promote health and wellness. This includes:  A yearly physical exam. This is also called an annual wellness visit.  Regular dental and eye exams.  Immunizations.  Screening for certain conditions.  Healthy lifestyle choices, such as: ? Eating a healthy diet. ? Getting regular exercise. ? Not using drugs or products that contain nicotine and tobacco. ? Limiting alcohol use. What can I expect for my preventive care visit? Physical exam Your health care provider will check your:  Height and weight. These may be used to calculate your BMI (body mass index). BMI is a measurement that tells if you are at a healthy weight.  Heart rate and blood pressure.  Body temperature.  Skin for abnormal spots. Counseling Your health care provider may ask you questions about your:  Past medical problems.  Family's medical history.  Alcohol, tobacco, and drug use.  Emotional well-being.  Home life and relationship well-being.  Sexual activity.  Diet, exercise, and sleep habits.  History of falls.  Memory and ability to understand (cognition).  Work and work Statistician.  Access to firearms. What immunizations do I need? Vaccines are usually given at various ages, according to a schedule. Your health care provider will recommend vaccines for you based on your age, medical history, and lifestyle or other factors, such as travel or where you work.   What tests do I need? Blood tests  Lipid and cholesterol levels. These may be checked every 5 years, or more often depending on your  overall health.  Hepatitis C test.  Hepatitis B test. Screening  Lung cancer screening. You may have this screening every year starting at age 53 if you have a 30-pack-year history of smoking and currently smoke or have quit within the past 15 years.  Colorectal cancer screening. ? All adults should have this screening starting at age 64 and continuing until age 3. ? Your health care provider may recommend screening at age 33 if you are at increased risk. ? You will have tests every 1-10 years, depending on your results and the type of screening test.  Prostate cancer screening. Recommendations will vary depending on your family history and other risks.  Genital exam to check for testicular cancer or hernias.  Diabetes screening. ? This is done by checking your blood sugar (glucose) after you have not eaten for a while (fasting). ? You may have this done every 1-3 years.  Abdominal aortic aneurysm (AAA) screening. You may need this if you are a current or former smoker.  STD (sexually transmitted disease) testing, if you are at risk. Follow these instructions at home: Eating and drinking  Eat a diet that includes fresh fruits and vegetables, whole grains, lean protein, and low-fat dairy products. Limit your intake of foods with high amounts of sugar, saturated fats, and salt.  Take vitamin and mineral supplements as recommended by your health care provider.  Do not drink alcohol if your health care provider tells you not to drink.  If you drink alcohol: ? Limit how much you have to 0-2 drinks a day. ?  Be aware of how much alcohol is in your drink. In the [place] one drink equals one 12 oz bottle of beer (355 mL), one 5 oz glass of wine (148 mL), or one 1 oz glass of hard liquor (44 mL).   Lifestyle  Take daily care of your teeth and gums. Brush your teeth every morning and night with fluoride toothpaste. Floss one time each day.  Stay active. Exercise for at least 30 minutes  5 or more days each week.  Do not use any products that contain nicotine or tobacco, such as cigarettes, e-cigarettes, and chewing tobacco. If you need help quitting, ask your health care provider.  Do not use drugs.  If you are sexually active, practice safe sex. Use a condom or other form of protection to prevent STIs (sexually transmitted infections).  Talk with your health care provider about taking a low-dose aspirin or statin.  Find healthy ways to cope with stress, such as: ? Meditation, yoga, or listening to music. ? Journaling. ? Talking to a trusted person. ? Spending time with friends and family. Safety  Always wear your seat belt while driving or riding in a vehicle.  Do not drive: ? If you have been drinking alcohol. Do not ride with someone who has been drinking. ? When you are tired or distracted. ? While texting.  Wear a helmet and other protective equipment during sports activities.  If you have firearms in your house, make sure you follow all gun safety procedures. What's next?  Visit your health care provider once a year for an annual wellness visit.  Ask your health care provider how often you should have your eyes and teeth checked.  Stay up to date on all vaccines. This information is not intended to replace advice given to you by your health care provider. Make sure you discuss any questions you have with your health care provider. Document Revised: 11/17/2018 Document Reviewed: 02/12/2018 Elsevier Patient Education  2021 [employer].

## 2020-04-05 ENCOUNTER — Encounter: Payer: Medicare Other | Admitting: Dermatology

## 2020-04-19 ENCOUNTER — Other Ambulatory Visit: Payer: Self-pay | Admitting: Primary Care

## 2020-04-20 ENCOUNTER — Other Ambulatory Visit: Payer: Self-pay

## 2020-04-20 ENCOUNTER — Encounter: Payer: Self-pay | Admitting: Nurse Practitioner

## 2020-04-20 ENCOUNTER — Ambulatory Visit: Payer: Medicare Other | Admitting: Nurse Practitioner

## 2020-04-20 VITALS — BP 140/60 | HR 59 | Ht 70.0 in | Wt 160.1 lb

## 2020-04-20 DIAGNOSIS — I10 Essential (primary) hypertension: Secondary | ICD-10-CM | POA: Diagnosis not present

## 2020-04-20 DIAGNOSIS — I25118 Atherosclerotic heart disease of native coronary artery with other forms of angina pectoris: Secondary | ICD-10-CM

## 2020-04-20 DIAGNOSIS — I452 Bifascicular block: Secondary | ICD-10-CM | POA: Diagnosis not present

## 2020-04-20 DIAGNOSIS — R001 Bradycardia, unspecified: Secondary | ICD-10-CM

## 2020-04-20 DIAGNOSIS — E785 Hyperlipidemia, unspecified: Secondary | ICD-10-CM

## 2020-04-20 NOTE — Progress Notes (Signed)
Office Visit    Patient Name: Tommy [name] Date of Encounter: 04/20/2020  Primary Care Provider:  Pleas Koch, NP Primary Cardiologist:  Nelva Bush, MD  Chief Complaint    Tommy [name] is a 87 y/o male with past history of PAD, hypertension, CAD s/p remote CABG, carotid artery disease s/p left CEA, hyperlipidemia, CKD 3b, T2DM, chronic anemia, G1DD, aortic valve sclerosis, mod pulmonary hypertension, mild abdominal aortic dilation who presents today for follow-up.   Past Medical History    Past Medical History:  Diagnosis Date  . Anemia   . Carotid artery occlusion    a. 2004 s/p L carotid endarterectomy; b. 02/2019 U/S: 1-39% bilat ICA stenosis. <50% bilat CCA stenosis; c. 02/2020 U/S: 50-69% RICA, s/p L CEA w/o hemodynamically signif narrowing.  . CKD (chronic kidney disease) stage 3, GFR 30-59 ml/min (HCC)   . Coronary artery disease    a. 1994 s/p CABG x 3, [place], [place] (LIMA->LAD, VG->dRCA, VG->unknown vessel); b. 02/2016 Cath: LM 99d, LAD 100ost, LCX 100ost, RCA 80ost, diffuse 85-67m. LIMA->LAD ok, VG->RCA ok. VG->unknown vessel 100.  . Diabetes mellitus without complication ([place])   . Diastolic dysfunction    a. 02/2017 Echo: EF 55-65%, no rwma. Nl DD. Ao sclerosis w/o stenosis. Mod TR. Mild MR; b. 02/2020 Echo: EF 60-65%, no rwma, Gr2 DD, nl RV size/fxn.   Marland Kitchen Dyslipidemia   . GERD (gastroesophageal reflux disease)   . Hemorrhoids   . 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 Hypertension   . Incontinence of feces 10/28/2019  . Pulmonary embolism ([place])   . Thyroid disease    Past Surgical History:  Procedure Laterality Date  . CAROTID ENDARTERECTOMY Left 2004  . CORONARY ARTERY BYPASS GRAFT  1994   [name]  . TONSILLECTOMY AND ADENOIDECTOMY      Allergies  Allergies  Allergen Reactions  . Heparin Other (See Comments)    "it caused low platelet count and made him have a clot"  . Patiromer Other (See Comments)  . Sodium Zirconium  Cyclosilicate Other (See Comments)    History of Present Illness    Pleasant 87 y/o male with past history of PAD, hypertension, CAD s/p remote CABG, carotid artery disease s/p left CEA, hyperlipidemia, trifascicular block, orthostatic lightheadedness, CKD 3b, T2DM, chronic anemia, G1DD, aortic valve sclerosis, mod pulmonary hypertension, mild abdominal aortic dilation, recent hospitalization s/p fall 02/13/20. He reported dizziness/vertigo symptoms to PCP earlier in Dec. 21. He states the fall occurred when he attempted to get out of the bed to go to the bathroom during the night and he fell back and hit his head, no LOC. He was discharged with a 14 day monitor which showed rare PACs and PVCs as well as a few episodes of sinus bradycardia and junctional rhythm with ventricular rates as low as 31 bpm.  Brief episodes of PSVT and SVT were also noted.   Today, he states he has had no further falls since Dec and continues to have episodes of lightheadedness during the night which he manages with a few seconds of dangling legs over side of bed. He has been feeling at baseline recently with occasional episodes of dyspnea associated with walking more than a short distance. He walks for 35 minutes on the treadmill 2-3 times per week and reports sometimes he can complete the walk without stopping and sometimes he has to stop a few times (related to fatigue/dyspnea). He walked in today from the parking lot and stopped one  time to sit in the chair. He has had no further episodes of chest discomfort since the episode he described to Dr. Saunders Revel during virtual visit on 03/15/20. He reports minimal activity at home because his son and daughter-in-law do everything. He states he falls asleep easily when not active. Reports good appetite but typically only eats 2 meals per day with snacks in between. He has no further concerns.   Home Medications    Prior to Admission medications   Medication Sig Start Date End Date Taking?  Authorizing Provider  aspirin 81 MG tablet Take 81 mg by mouth at bedtime.    [provider]  atorvastatin (LIPITOR) 40 MG tablet TAKE 1 TABLET BY MOUTH  DAILY Patient taking differently: Take 40 mg by mouth at bedtime. 11/01/19   Pleas Koch, NP  B-D ULTRAFINE III SHORT PEN 31G X 8 MM MISC USE AS INSTRUCTED TO INJECT INSULIN DAILY. 03/24/20   Pleas Koch, NP  Bacillus Coagulans-Inulin (ALIGN PREBIOTIC-PROBIOTIC PO) Take 1 capsule by mouth daily at 12 noon.    [provider]  betamethasone valerate (VALISONE) 0.1 % cream APPLY TO AFFECTED AREA TWICE A DAY 03/30/20   Pleas Koch, NP  Blood Glucose Monitoring Suppl (ONETOUCH VERIO) w/Device KIT 1 Device by Does not apply route daily. Use as instructed to test blood sugar daily 03/31/20   Pleas Koch, NP  calcium carbonate (OSCAL) 1500 (600 Ca) MG TABS tablet Take 600 mg of elemental calcium by mouth at bedtime.    [provider]  cholecalciferol (VITAMIN D3) 25 MCG (1000 UT) tablet Take 1,000 Units by mouth at bedtime.    [provider]  epoetin alfa (EPOGEN,PROCRIT) 81017 UNIT/ML injection Inject 20,000 Units into the skin as directed. Receives if hemoglobin <10    [provider]  glimepiride (AMARYL) 4 MG tablet TAKE 1 TABLET BY MOUTH  DAILY WITH BREAKFAST Patient taking differently: Take 4 mg by mouth daily with breakfast. 01/04/20   Pleas Koch, NP  Hydrocortisone (PROCTOSOL HC RE) Place 1 Dose rectally as needed.    [provider]  Insulin Glargine (LANTUS) 100 UNIT/ML Solostar Pen Inject 6 Units into the skin at bedtime.    [provider]  Insulin Pen Needle 29G X 12.7MM MISC BD Ultrafine Pen Needle Use as instructed to inject insulin daily 02/18/17   Pleas Koch, NP  IRON PO Take 2 tablets by mouth 2 (two) times daily.    [provider]  Lancets (ONETOUCH DELICA PLUS [id number]) [place] USE AS INSTRUCT TO TEST BLOOD SUGAR ONCE DAILY  07/05/19   Pleas Koch, NP  levothyroxine (SYNTHROID) 175 MCG tablet TAKE 1 TABLET BY MOUTH IN  THE MORNING ON AN EMPTY  STOMACH WITH A FULL GLASS  OF WATER Patient taking differently: Take 175 mcg by mouth daily before breakfast. 01/04/20   Pleas Koch, NP  meclizine (ANTIVERT) 12.5 MG tablet Take 1 tablet (12.5 mg total) by mouth 3 (three) times daily as needed for dizziness. 02/10/20   Pleas Koch, NP  Multiple Vitamin (MULTIVITAMIN) capsule Take 1 capsule by mouth daily.    [provider]  nitroGLYCERIN (NITROSTAT) 0.4 MG SL tablet Place 0.4 mg under the tongue every 5 (five) minutes as needed for chest pain.    [provider]  Nutritional Supplements (JUICE PLUS FIBRE PO) Take 1 capsule by mouth daily.    [provider]  Omega-3 Fatty Acids (FISH OIL PO) Take 2  capsules by mouth 2 (two) times daily.    [provider]  [facility] VERIO test strip USE AS INSTRUCTED TO TEST BLOOD SUGAR ONCE DAILY 07/26/19   Pleas Koch, NP  pantoprazole (PROTONIX) 20 MG tablet TAKE 1 TABLET BY MOUTH  DAILY Patient taking differently: Take 20 mg by mouth daily. 11/12/19   Pleas Koch, NP  ranolazine (RANEXA) 1000 MG SR tablet Take 1 tablet (1,000 mg total) by mouth 2 (two) times daily. 02/28/20   End, Harrell Gave, MD  timolol (TIMOPTIC) 0.5 % ophthalmic solution Place 1 drop into both eyes daily. 03/17/17   [provider]    Review of Systems    He denies chest discomfort, palpitations, syncope, orthopnea, PND, n/v. He reports occasional dyspnea in the setting of walking more than a short distance. He has occasional lightheadedness. Denies dizziness. All other systems reviewed and are otherwise negative except as noted above.  Physical Exam    VS:   Orthostatic VS for the past 24 hrs:  BP- Lying Pulse- Lying BP- Sitting Pulse- Sitting BP- Standing at 0 minutes Pulse- Standing at 0 minutes  04/20/20 1408 148/63 58 147/61 62 140/58 61    Body mass index is 22.98 kg/m. GEN: Well nourished, well developed, in no acute distress. HEENT: normal. Neck: Supple, no JVD, carotid bruits, or masses. Cardiac: RRR, no rubs or gallops. Systolic murmur 2/6 LSB. No clubbing, cyanosis, edema.  Radials/PT 2+ and equal bilaterally.  Respiratory:  Respirations regular and unlabored, clear to auscultation bilaterally. GI: Soft, nontender, nondistended, BS + x 4. MS: no deformity or atrophy. Skin: warm and dry, no rash. Neuro:  Strength and sensation are intact. Psych: Normal affect.  Accessory Clinical Findings    ECG personally reviewed by me today - Not done today  Lab Results  Component Value Date   WBC 8.6 02/15/2020   HGB 9.9 (L) 03/24/2020   HCT 29.0 (L) 03/24/2020   MCV 103.1 (H) 02/15/2020   PLT 179 02/15/2020   Lab Results  Component Value Date   CREATININE 1.48 (H) 02/15/2020   BUN 31 (H) 02/15/2020   NA 135 02/15/2020   K 4.3 02/15/2020   CL 102 02/15/2020   CO2 25 02/15/2020   Lab Results  Component Value Date   ALT 23 02/13/2020   AST 29 02/13/2020   ALKPHOS 76 02/13/2020   BILITOT 0.8 02/13/2020   Lab Results  Component Value Date   CHOL 129 04/20/2019   HDL 44.50 04/20/2019   LDLCALC 50 04/20/2019   TRIG 176.0 (H) 04/20/2019   CHOLHDL 3 04/20/2019    Lab Results  Component Value Date   HGBA1C 6.8 (H) 02/14/2020    Assessment & Plan    1.  CAD with stable angina: S/p CABG in 1994 w/ 2/3 patent grafts on cath in 2017.  He has not had any further episodes of chest discomfort since last ov. He states he has chest discomfort less than once per month, however he has dyspnea with exertion that occurs more frequently. He states his DOE is not worse than 1 month ago. He continues to walk on the treadmill for 35 minutes 2-3 days per week and sometimes has to pause to catch his breath but not always. His symptoms are stable and no further testing is warranted at this time. Advised him to call if symptoms  worsen prior to his 3 month f/u. Will continue to monitor.   2. Bifascicular block w/ 1st deg avb/bradycardia: Ecg was  not repeated today. 14 day monitor completed 03/13/20 showed rare PACs and PVCs as well as a few episodes of sinus bradycardia and junctional rhythm with ventricular rates as low as 31 bpm. Brief episodes of PSVT and SVT were also noted. The slowest HR readings were in the afternoon at which time patient states he could have been napping. He is not on any  blocker or ccb that could lower HR. Advised him to call prior to 3 month f/u if symptoms worsen. Will continue close follow-up.   3. Orthostatic hypotension/lightheadedness: BPs mildly elevated w/o evidence of orthostasis.  Given history, will allow for his bp to run a little higher. Advised him to continue to change positions slowly. He drinks 2 cups of water daily, advised him to try to increase to at least 4 cups per day. No changes in medication. Cont compression socks.  He has not invested in a an abd binder, and thinks he is able to manage his symptoms well by moving slowly.  4. Hyperlipidemia: LDL @ 50 Feb. 21 on atorvastatin 40 mg. Continue current therapy.   5.  DMII:  Remains on low dose lantus and amaryl.  A1c 6.8 in 02/2020.  Followed by primary care.  6. Disposition: No changes in medical therapy today. Follow up in 3 months with Dr. Saunders Revel or APP.  Murray Hodgkins, NP 04/20/2020, 4:55 PM

## 2020-04-20 NOTE — Patient Instructions (Signed)
Medication Instructions:  Your physician recommends that you continue on your current medications as directed. Please refer to the Current Medication list given to you today.  *If you need a refill on your cardiac medications before your next appointment, please call your pharmacy*  Follow-Up: At [facility], you and your health needs are our priority.  As part of our continuing mission to provide you with exceptional heart care, we have created designated Provider Care Teams.  These Care Teams include your primary Cardiologist (physician) and Advanced Practice Providers (APPs -  Physician Assistants and Nurse Practitioners) who all work together to provide you with the care you need, when you need it.  We recommend signing up for the patient portal called "MyChart".  Sign up information is provided on this After Visit Summary.  MyChart is used to connect with patients for Virtual Visits (Telemedicine).  Patients are able to view lab/test results, encounter notes, upcoming appointments, etc.  Non-urgent messages can be sent to your provider as well.   To learn more about what you can do with MyChart, go to [email or web address].    Your next appointment:   3 month(s)  The format for your next appointment:   In Person  Provider:   You may see Christopher End, MD or one of the following Advanced Practice Providers on your designated Care Team:    Christopher Berge, NP  Ryan Dunn, PA-C  Jacquelyn Visser, PA-C  Cadence Furth, PA-C  Caitlin Walker, NP  

## 2020-04-21 ENCOUNTER — Inpatient Hospital Stay: Payer: Medicare Other

## 2020-04-21 ENCOUNTER — Inpatient Hospital Stay: Payer: Medicare Other | Attending: Oncology

## 2020-04-21 VITALS — BP 160/63 | HR 69

## 2020-04-21 DIAGNOSIS — D631 Anemia in chronic kidney disease: Secondary | ICD-10-CM

## 2020-04-21 DIAGNOSIS — I129 Hypertensive chronic kidney disease with stage 1 through stage 4 chronic kidney disease, or unspecified chronic kidney disease: Secondary | ICD-10-CM | POA: Insufficient documentation

## 2020-04-21 DIAGNOSIS — N1832 Chronic kidney disease, stage 3b: Secondary | ICD-10-CM | POA: Insufficient documentation

## 2020-04-21 DIAGNOSIS — N183 Chronic kidney disease, stage 3 unspecified: Secondary | ICD-10-CM

## 2020-04-21 DIAGNOSIS — Z79899 Other long term (current) drug therapy: Secondary | ICD-10-CM | POA: Diagnosis not present

## 2020-04-21 LAB — HEMATOCRIT: HCT: 29.1 % — ABNORMAL LOW (ref 39.0–52.0)

## 2020-04-21 LAB — HEMOGLOBIN: Hemoglobin: 9.6 g/dL — ABNORMAL LOW (ref 13.0–17.0)

## 2020-04-21 MED ORDER — EPOETIN ALFA-EPBX 10000 UNIT/ML IJ SOLN
20000.0000 [IU] | Freq: Once | INTRAMUSCULAR | Status: AC
  Administered 2020-04-21: 20000 [IU] via SUBCUTANEOUS
  Filled 2020-04-21: qty 2

## 2020-05-10 ENCOUNTER — Encounter: Payer: Medicare Other | Admitting: Dermatology

## 2020-05-11 ENCOUNTER — Ambulatory Visit: Payer: Medicare Other | Admitting: Dermatology

## 2020-05-11 ENCOUNTER — Other Ambulatory Visit: Payer: Self-pay

## 2020-05-11 DIAGNOSIS — L578 Other skin changes due to chronic exposure to nonionizing radiation: Secondary | ICD-10-CM

## 2020-05-11 DIAGNOSIS — L570 Actinic keratosis: Secondary | ICD-10-CM

## 2020-05-11 DIAGNOSIS — Z872 Personal history of diseases of the skin and subcutaneous tissue: Secondary | ICD-10-CM

## 2020-05-11 DIAGNOSIS — D692 Other nonthrombocytopenic purpura: Secondary | ICD-10-CM | POA: Diagnosis not present

## 2020-05-11 DIAGNOSIS — L821 Other seborrheic keratosis: Secondary | ICD-10-CM

## 2020-05-11 DIAGNOSIS — Z1283 Encounter for screening for malignant neoplasm of skin: Secondary | ICD-10-CM

## 2020-05-11 DIAGNOSIS — D229 Melanocytic nevi, unspecified: Secondary | ICD-10-CM

## 2020-05-11 DIAGNOSIS — L814 Other melanin hyperpigmentation: Secondary | ICD-10-CM

## 2020-05-11 DIAGNOSIS — D1800 Hemangioma unspecified site: Secondary | ICD-10-CM

## 2020-05-11 NOTE — Progress Notes (Signed)
   Follow-Up Visit   Subjective  Tommy [name] is a 87 y.o. male who presents for the following: total body skin exam (Hx of AKs) and Mass (Buttocks, just noticed over last 2 months, no symptoms). The patient presents for Total-Body Skin Exam (TBSE) for skin cancer screening and mole check.  The following portions of the chart were reviewed this encounter and updated as appropriate:   Tobacco  Allergies  Meds  Problems  Med Hx  Surg Hx  Fam Hx     Review of Systems:  No other skin or systemic complaints except as noted in HPI or Assessment and Plan.  Objective  Well appearing patient in no apparent distress; mood and affect are within normal limits.  A full examination was performed including scalp, head, eyes, ears, nose, lips, neck, chest, axillae, abdomen, back, buttocks, bilateral upper extremities, bilateral lower extremities, hands, feet, fingers, toes, fingernails, and toenails. All findings within normal limits unless otherwise noted below.  Objective  face x 6 (6): Pink scaly macules    Assessment & Plan    Lentigines - Scattered tan macules - Due to sun exposure - Benign-appering, observe - Recommend daily broad spectrum sunscreen SPF 30+ to sun-exposed areas, reapply every 2 hours as needed. - Call for any changes  Seborrheic Keratoses - Stuck-on, waxy, tan-brown papules and plaques  - Discussed benign etiology and prognosis. - Observe - Call for any changes  Melanocytic Nevi - Tan-brown and/or pink-flesh-colored symmetric macules and papules - Benign appearing on exam today - Observation - Call clinic for new or changing moles - Recommend daily use of broad spectrum spf 30+ sunscreen to sun-exposed areas.   Hemangiomas - Red papules - Discussed benign nature - Observe - Call for any changes  Actinic Damage - Chronic, secondary to cumulative UV/sun exposure - diffuse scaly erythematous macules with underlying dyspigmentation - Recommend  daily broad spectrum sunscreen SPF 30+ to sun-exposed areas, reapply every 2 hours as needed.  - Call for new or changing lesions.  Skin cancer screening performed today.  Purpura - Chronic; persistent and recurrent.  Treatable, but not curable. - Violaceous macules and patches - Benign - Related to trauma, age, sun damage and/or use of blood thinners, chronic use of topical and/or oral steroids - Observe - Can use OTC arnica containing moisturizer such as Dermend Bruise Formula if desired - Call for worsening or other concerns  Perianal Clear, normal skin  AK (actinic keratosis) (6) face x 6  Destruction of lesion - face x 6 Complexity: simple   Destruction method: cryotherapy   Informed consent: discussed and consent obtained   Timeout:  patient name, date of birth, surgical site, and procedure verified Lesion destroyed using liquid nitrogen: Yes   Region frozen until ice ball extended beyond lesion: Yes   Outcome: patient tolerated procedure well with no complications   Post-procedure details: wound care instructions given    Skin cancer screening  Return in about 1 year (around 05/11/2021) for TBSE, Hx of AKs.   I, Othelia Pulling, RMA, am acting as scribe for Sarina Ser, MD .  Documentation: I have reviewed the above documentation for accuracy and completeness, and I agree with the above.  Sarina Ser, MD

## 2020-05-11 NOTE — Patient Instructions (Signed)

## 2020-05-19 ENCOUNTER — Emergency Department
Admission: EM | Admit: 2020-05-19 | Discharge: 2020-05-19 | Disposition: A | Discharge status: Home / Self Care | Payer: Medicare Other | Attending: Emergency Medicine | Admitting: Emergency Medicine

## 2020-05-19 ENCOUNTER — Emergency Department: Payer: Medicare Other

## 2020-05-19 ENCOUNTER — Inpatient Hospital Stay: Payer: Medicare Other

## 2020-05-19 ENCOUNTER — Inpatient Hospital Stay: Payer: Medicare Other | Attending: Oncology

## 2020-05-19 ENCOUNTER — Other Ambulatory Visit: Payer: Self-pay

## 2020-05-19 DIAGNOSIS — Z951 Presence of aortocoronary bypass graft: Secondary | ICD-10-CM | POA: Diagnosis not present

## 2020-05-19 DIAGNOSIS — Z79899 Other long term (current) drug therapy: Secondary | ICD-10-CM | POA: Insufficient documentation

## 2020-05-19 DIAGNOSIS — E1122 Type 2 diabetes mellitus with diabetic chronic kidney disease: Secondary | ICD-10-CM | POA: Insufficient documentation

## 2020-05-19 DIAGNOSIS — M542 Cervicalgia: Secondary | ICD-10-CM | POA: Insufficient documentation

## 2020-05-19 DIAGNOSIS — Z7982 Long term (current) use of aspirin: Secondary | ICD-10-CM | POA: Diagnosis not present

## 2020-05-19 DIAGNOSIS — Z87891 Personal history of nicotine dependence: Secondary | ICD-10-CM | POA: Diagnosis not present

## 2020-05-19 DIAGNOSIS — I129 Hypertensive chronic kidney disease with stage 1 through stage 4 chronic kidney disease, or unspecified chronic kidney disease: Secondary | ICD-10-CM | POA: Insufficient documentation

## 2020-05-19 DIAGNOSIS — I25118 Atherosclerotic heart disease of native coronary artery with other forms of angina pectoris: Secondary | ICD-10-CM | POA: Insufficient documentation

## 2020-05-19 DIAGNOSIS — Z7984 Long term (current) use of oral hypoglycemic drugs: Secondary | ICD-10-CM | POA: Diagnosis not present

## 2020-05-19 DIAGNOSIS — N1832 Chronic kidney disease, stage 3b: Secondary | ICD-10-CM | POA: Insufficient documentation

## 2020-05-19 DIAGNOSIS — I770 Arteriovenous fistula, acquired: Secondary | ICD-10-CM | POA: Diagnosis not present

## 2020-05-19 DIAGNOSIS — N320 Bladder-neck obstruction: Secondary | ICD-10-CM | POA: Diagnosis not present

## 2020-05-19 DIAGNOSIS — E039 Hypothyroidism, unspecified: Secondary | ICD-10-CM | POA: Diagnosis not present

## 2020-05-19 LAB — CBC
HCT: 32.5 % — ABNORMAL LOW (ref 39.0–52.0)
Hemoglobin: 11.2 g/dL — ABNORMAL LOW (ref 13.0–17.0)
MCH: 34.8 pg — ABNORMAL HIGH (ref 26.0–34.0)
MCHC: 34.5 g/dL (ref 30.0–36.0)
MCV: 100.9 fL — ABNORMAL HIGH (ref 80.0–100.0)
Platelets: 159 10*3/uL (ref 150–400)
RBC: 3.22 MIL/uL — ABNORMAL LOW (ref 4.22–5.81)
RDW: 12.2 % (ref 11.5–15.5)
WBC: 9.5 10*3/uL (ref 4.0–10.5)
nRBC: 0 % (ref 0.0–0.2)

## 2020-05-19 LAB — BASIC METABOLIC PANEL
Anion gap: 8 (ref 5–15)
BUN: 31 mg/dL — ABNORMAL HIGH (ref 8–23)
CO2: 25 mmol/L (ref 22–32)
Calcium: 9.6 mg/dL (ref 8.9–10.3)
Chloride: 97 mmol/L — ABNORMAL LOW (ref 98–111)
Creatinine, Ser: 1.55 mg/dL — ABNORMAL HIGH (ref 0.61–1.24)
GFR, Estimated: 42 mL/min — ABNORMAL LOW (ref >60)
Glucose, Bld: 302 mg/dL — ABNORMAL HIGH (ref 70–99)
Potassium: 5 mmol/L (ref 3.5–5.1)
Sodium: 130 mmol/L — ABNORMAL LOW (ref 135–145)

## 2020-05-19 LAB — TROPONIN I (HIGH SENSITIVITY)
Troponin I (High Sensitivity): 18 ng/L — ABNORMAL HIGH (ref <18)
Troponin I (High Sensitivity): 20 ng/L — ABNORMAL HIGH (ref <18)

## 2020-05-19 MED ORDER — ACETAMINOPHEN 325 MG PO TABS: 650.0000 mg | ORAL_TABLET | ORAL | 0 refills | Status: DC | PRN

## 2020-05-19 MED ORDER — DICLOFENAC SODIUM 3 % EX GEL: 4.0000 g | Freq: Two times a day (BID) | CUTANEOUS | 0 refills | Status: AC

## 2020-05-19 MED ORDER — SODIUM CHLORIDE 0.9 % IV SOLN: Freq: Once | INTRAVENOUS | Status: AC

## 2020-05-19 MED ORDER — TAMSULOSIN HCL 0.4 MG PO CAPS
0.4000 mg | ORAL_CAPSULE | Freq: Every day | ORAL | Status: DC
  Administered 2020-05-19: 0.4 mg via ORAL
  Filled 2020-05-19: qty 1

## 2020-05-19 MED ORDER — ACETAMINOPHEN 500 MG PO TABS
1000.0000 mg | ORAL_TABLET | Freq: Once | ORAL | Status: AC
  Administered 2020-05-19: 1000 mg via ORAL
  Filled 2020-05-19: qty 2

## 2020-05-19 MED ORDER — MORPHINE SULFATE (PF) 2 MG/ML IV SOLN
2.0000 mg | Freq: Once | INTRAVENOUS | Status: AC
  Administered 2020-05-19: 2 mg via INTRAVENOUS
  Filled 2020-05-19: qty 1

## 2020-05-19 MED ORDER — TAMSULOSIN HCL 0.4 MG PO CAPS: 0.4000 mg | ORAL_CAPSULE | Freq: Every day | ORAL | 0 refills | Status: DC

## 2020-05-19 MED ORDER — IOHEXOL 350 MG/ML SOLN
75.0000 mL | Freq: Once | INTRAVENOUS | Status: AC | PRN
  Administered 2020-05-19: 75 mL via INTRAVENOUS

## 2020-05-19 NOTE — ED Notes (Signed)
MD Tamala Julian calling STEMI at this time. First Haematologist informed at this time and RN SAm.

## 2020-05-19 NOTE — ED Provider Notes (Addendum)
[facility] Emergency Department Provider Note  ____________________________________________   Event Date/Time   First MD Initiated Contact with Patient 05/19/20 1533     (approximate)  I have reviewed the triage vital signs and the nursing notes.   HISTORY  Chief Complaint Weakness    HPI Tommy [name] is a 87 y.o. male  With h/o CAD, HTN, HLD, DM, PVD, s/p CABG, here with neck pain. Pt reports that over the past 2 days, he's had positional, sharp, neck and upper back pain. He worked out the day before but denies any new exercise. Reports the pain is worse w/ movement, palpation. No alleviating factors. He denies any associated SOB, diaphoresis, n/v. H/o CAD but denies any pain similar to this with his prior MI. Sees Dr. Saunders Revel. No recent med changes. Pain improved w/ rest, stretching.        Past Medical History:  Diagnosis Date  . Anemia   . Carotid artery occlusion    a. 2004 s/p L carotid endarterectomy; b. 02/2019 U/S: 1-39% bilat ICA stenosis. <50% bilat CCA stenosis; c. 02/2020 U/S: 50-69% RICA, s/p L CEA w/o hemodynamically signif narrowing.  . CKD (chronic kidney disease) stage 3, GFR 30-59 ml/min (HCC)   . Coronary artery disease    a. 1994 s/p CABG x 3, [place], [place] (LIMA->LAD, VG->dRCA, VG->unknown vessel); b. 02/2016 Cath: LM 99d, LAD 100ost, LCX 100ost, RCA 80ost, diffuse 85-75m LIMA->LAD ok, VG->RCA ok. VG->unknown vessel 100.  . Diabetes mellitus without complication ([place]   . Diastolic dysfunction    a. 02/2017 Echo: EF 55-65%, no rwma. Nl DD. Ao sclerosis w/o stenosis. Mod TR. Mild MR; b. 02/2020 Echo: EF 60-65%, no rwma, Gr2 DD, nl RV size/fxn.   .Marland KitchenDyslipidemia   . GERD (gastroesophageal reflux disease)   . Hemorrhoids   . 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 Hypertension   . Incontinence of feces 10/28/2019  . Pulmonary embolism ([facility]   . Thyroid disease     Patient Active Problem List   Diagnosis Date Noted  .  Preventative health care 04/03/2020  . Orthostatic lightheadedness 03/15/2020  . Heparin induced thrombocytopenia ([place] 02/14/2020  . Lightheadedness 02/14/2020  . Acute kidney injury superimposed on CKD 3b ([place] 02/13/2020  . Postural dizziness with presyncope 02/13/2020  . Elevated troponin 02/13/2020  . Varicose veins of both lower extremities 02/04/2020  . Cerumen impaction 01/24/2020  . Nocturia 01/13/2020  . Trifascicular block 10/20/2019  . PAD (peripheral artery disease) ([place] 10/20/2019  . Fall 08/04/2019  . Generalized weakness 06/09/2019  . First degree AV block 12/24/2018  . Bilateral carotid artery stenosis 09/17/2017  . Anemia of chronic disease 09/17/2017  . Anemia of chronic renal failure, stage 3 (moderate) ([facility] 05/23/2017  . Aortic valve sclerosis 02/04/2017  . Carotid artery disease ([facility] 02/04/2017  . H/O endarterectomy 02/04/2017  . Hyperlipidemia LDL goal <70 02/04/2017  . Essential hypertension 02/04/2017  . GERD (gastroesophageal reflux disease) 12/10/2016  . Stage 3 chronic kidney disease ([facility] 12/09/2016  . Coronary artery disease of native artery of native heart with stable angina pectoris ([place] 12/09/2016  . Type 2 diabetes mellitus ([facility] 12/09/2016  . Hypothyroidism 12/09/2016    Past Surgical History:  Procedure Laterality Date  . CAROTID ENDARTERECTOMY Left 2004  . CORONARY ARTERY BYPASS GRAFT  1994   [name]  . TONSILLECTOMY AND ADENOIDECTOMY      Prior to Admission medications   Medication Sig Start Date End Date Taking? Authorizing Provider  acetaminophen (TYLENOL) 325 MG tablet Take 2 tablets (650 mg total) by mouth every 4 (four) hours as needed for moderate pain. 05/19/20 05/19/21 Yes Duffy Bruce, MD  Diclofenac Sodium 3 % GEL Apply 4 g topically in the morning and at bedtime for 5 days. 05/19/20 05/24/20 Yes Duffy Bruce, MD  tamsulosin (FLOMAX) 0.4 MG CAPS capsule Take 1 capsule (0.4 mg total) by mouth daily after supper.  05/19/20 06/18/20 Yes Duffy Bruce, MD  aspirin 81 MG tablet Take 81 mg by mouth at bedtime.    [provider]  atorvastatin (LIPITOR) 40 MG tablet TAKE 1 TABLET BY MOUTH  DAILY 11/01/19   Pleas Koch, NP  B-D ULTRAFINE III SHORT PEN 31G X 8 MM MISC USE AS INSTRUCTED TO INJECT INSULIN DAILY. 03/24/20   Pleas Koch, NP  Bacillus Coagulans-Inulin (ALIGN PREBIOTIC-PROBIOTIC PO) Take 1 capsule by mouth daily at 12 noon.    [provider]  betamethasone valerate (VALISONE) 0.1 % cream APPLY TO AFFECTED AREA TWICE A DAY 03/30/20   Pleas Koch, NP  Blood Glucose Monitoring Suppl (ONETOUCH VERIO) w/Device KIT 1 Device by Does not apply route daily. Use as instructed to test blood sugar daily 03/31/20   Pleas Koch, NP  calcium carbonate (OSCAL) 1500 (600 Ca) MG TABS tablet Take 600 mg of elemental calcium by mouth at bedtime.    [provider]  cholecalciferol (VITAMIN D3) 25 MCG (1000 UT) tablet Take 1,000 Units by mouth at bedtime.    [provider]  epoetin alfa (EPOGEN,PROCRIT) 41660 UNIT/ML injection Inject 20,000 Units into the skin as directed. Receives if hemoglobin <10    [provider]  glimepiride (AMARYL) 4 MG tablet TAKE 1 TABLET BY MOUTH  DAILY WITH BREAKFAST 01/04/20   Pleas Koch, NP  Hydrocortisone (PROCTOSOL HC RE) Place 1 Dose rectally as needed.    [provider]  Insulin Glargine (LANTUS) 100 UNIT/ML Solostar Pen Inject 6 Units into the skin at bedtime.    [provider]  Insulin Pen Needle 29G X 12.7MM MISC BD Ultrafine Pen Needle Use as instructed to inject insulin daily 02/18/17   Pleas Koch, NP  IRON PO Take 2 tablets by mouth 2 (two) times daily.    [provider]  Lancets (ONETOUCH DELICA PLUS [id number]) [place] USE AS INSTRUCT TO TEST BLOOD SUGAR ONCE DAILY 07/05/19   Pleas Koch, NP  levothyroxine (SYNTHROID) 175 MCG tablet Take 175 mcg by mouth daily before  breakfast.    [provider]  meclizine (ANTIVERT) 12.5 MG tablet Take 1 tablet (12.5 mg total) by mouth 3 (three) times daily as needed for dizziness. 02/10/20   Pleas Koch, NP  Multiple Vitamin (MULTIVITAMIN) capsule Take 1 capsule by mouth daily.    [provider]  nitroGLYCERIN (NITROSTAT) 0.4 MG SL tablet Place 0.4 mg under the tongue every 5 (five) minutes as needed for chest pain.    [provider]  Nutritional Supplements (JUICE PLUS FIBRE PO) Take 1 capsule by mouth daily.    [provider]  Omega-3 Fatty Acids (FISH OIL PO) Take 2 capsules by mouth 2 (two) times daily.    [provider]  [facility] VERIO test strip USE AS INSTRUCTED TO TEST BLOOD SUGAR ONCE DAILY 07/26/19   Pleas Koch, NP  pantoprazole (PROTONIX) 20 MG tablet TAKE 1 TABLET BY MOUTH  DAILY 11/12/19   Pleas Koch, NP  ranolazine (RANEXA) 1000 MG SR tablet  Take 1 tablet (1,000 mg total) by mouth 2 (two) times daily. 02/28/20   End, Harrell Gave, MD  timolol (TIMOPTIC) 0.5 % ophthalmic solution Place 1 drop into both eyes daily. 03/17/17   [provider]    Allergies Heparin, Patiromer, and Sodium zirconium cyclosilicate  Family History  Problem Relation Age of Onset  . Heart attack Mother   . Heart disease Mother   . Stroke Mother   . Diabetes Father   . Coronary artery disease Sister     Social History Social History   Tobacco Use  . Smoking status: Former Smoker    Packs/day: 1.50    Years: 25.00    Pack years: 37.50    Types: Cigarettes    Quit date: 1978    Years since quitting: 44.2  . Smokeless tobacco: Never Used  Vaping Use  . Vaping Use: Never used  Substance Use Topics  . Alcohol use: Yes    Alcohol/week: 1.0 standard drink    Types: 1 Glasses of wine per week    Comment: socially   . Drug use: No    Review of Systems  Review of Systems  Constitutional: Negative for chills, fatigue and fever.  HENT: Negative  for sore throat.   Respiratory: Negative for shortness of breath.   Cardiovascular: Negative for chest pain.  Gastrointestinal: Negative for abdominal pain.  Genitourinary: Negative for flank pain.  Musculoskeletal: Positive for arthralgias, neck pain and neck stiffness.  Skin: Negative for rash and wound.  Allergic/Immunologic: Negative for immunocompromised state.  Neurological: Negative for weakness and numbness.  Hematological: Does not bruise/bleed easily.  All other systems reviewed and are negative.    ____________________________________________  PHYSICAL EXAM:      VITAL SIGNS: ED Triage Vitals  Enc Vitals Group     BP 05/19/20 1510 (!) 201/67     Pulse Rate 05/19/20 1510 69     Resp 05/19/20 1510 16     Temp 05/19/20 1510 97.8 F (36.6 C)     Temp Source 05/19/20 1510 Oral     SpO2 05/19/20 1510 99 %     Weight 05/19/20 1508 160 lb (72.6 kg)     Height 05/19/20 1508 _0  (1.778 m)     Head Circumference --      Peak Flow --      Pain Score 05/19/20 1508 10     Pain Loc --      Pain Edu? --      Excl. in [place]? --      Physical Exam Vitals and nursing note reviewed.  Constitutional:      General: He is not in acute distress.    Appearance: He is well-developed.  HENT:     Head: Normocephalic and atraumatic.  Eyes:     Conjunctiva/sclera: Conjunctivae normal.  Neck:     Comments: Moderate paraspinal TTP b/l upper cervical spine. Cardiovascular:     Rate and Rhythm: Normal rate and regular rhythm.     Heart sounds: Normal heart sounds.  Pulmonary:     Effort: Pulmonary effort is normal. No respiratory distress.     Breath sounds: No wheezing.  Abdominal:     General: There is no distension.  Musculoskeletal:     Cervical back: Neck supple.  Skin:    General: Skin is warm.     Capillary Refill: Capillary refill takes less than 2 seconds.     Findings: No rash.  Neurological:     Mental Status:  He is alert and oriented to person, place, and time.      Motor: No abnormal muscle tone.       ____________________________________________   LABS (all labs ordered are listed, but only abnormal results are displayed)  Labs Reviewed  BASIC METABOLIC PANEL - Abnormal; Notable for the following components:      Result Value   Sodium 130 (*)    Chloride 97 (*)    Glucose, Bld 302 (*)    BUN 31 (*)    Creatinine, Ser 1.55 (*)    GFR, Estimated 42 (*)    All other components within normal limits  CBC - Abnormal; Notable for the following components:   RBC 3.22 (*)    Hemoglobin 11.2 (*)    HCT 32.5 (*)    MCV 100.9 (*)    MCH 34.8 (*)    All other components within normal limits  TROPONIN I (HIGH SENSITIVITY) - Abnormal; Notable for the following components:   Troponin I (High Sensitivity) 18 (*)    All other components within normal limits  TROPONIN I (HIGH SENSITIVITY) - Abnormal; Notable for the following components:   Troponin I (High Sensitivity) 20 (*)    All other components within normal limits  URINALYSIS, COMPLETE (UACMP) WITH MICROSCOPIC  CBG MONITORING, ED    ____________________________________________  EKG: Normal sinus rhythm, VR 72. QRS 159, QTc 474. No acute st Elevations. Prior ST elevations improved from prior. RBBB and LAFB. ________________________________________  RADIOLOGY All imaging, including plain films, CT scans, and ultrasounds, independently reviewed by me, and interpretations confirmed via formal radiology reads.  ED MD interpretation:   CXR: Normal CT C Spine: Multilevel degen disease CT Angio C/A/P: No dissection, urinary distension noted, early filling of R femoral vein suggesting small AV fistula, mild aneurysm of distal infrarenal AA, IVC filter in place [place]: Small AV fistula possible  Official radiology report(s): CT Cervical Spine Wo Contrast  Result Date: 05/19/2020 CLINICAL DATA:  Neck trauma (Age >= 65y) Posterior neck pain going to both shoulders. EXAM: CT CERVICAL SPINE WITHOUT  CONTRAST TECHNIQUE: Multidetector CT imaging of the cervical spine was performed without intravenous contrast. Multiplanar CT image reconstructions were also generated. COMPARISON:  CT 02/13/2020 FINDINGS: Alignment: Stable grade 1 anterolisthesis of C3 on C4. Skull base and vertebrae: No acute fracture. No primary bone lesion or focal pathologic process. Dens and skull base are intact. Calcified pannus at C1-C2 as seen on prior exam, no significant change. Soft tissues and spinal canal: No prevertebral fluid or swelling. No visible canal hematoma. Disc levels: Degenerative disc disease C4-C5 through C6-C7. Multilevel facet hypertrophy. Multilevel bony neural foraminal stenosis most prominent at C6-C7. Upper chest: No acute findings. Other: None. IMPRESSION: 1. No acute fracture or subluxation of the cervical spine. 2. Multilevel degenerative disc disease and facet hypertrophy. Electronically Signed   By: Keith Rake M.D.   On: 05/19/2020 17:04   [place] Lower Ext Art Right Ltd  Result Date: 05/19/2020 CLINICAL DATA:  Abnormal femoral artery/vein on CT. Evaluate for AV fistula or pseudoaneurysm. EXAM: ULTRASOUND RIGHT LOWER EXTREMITY LIMITED TECHNIQUE: Ultrasound examination of the lower extremity vasculature was performed in the area of clinical concern. COMPARISON:  CT earlier today FINDINGS: Targeted sonographic evaluation of the area of clinical concern involving the common femoral artery and vein. Blood flow is noted between the common femoral artery and femoral vein most consistent with AV fistula. No definite filling defect within the visualized vascular structures to suggest thrombus. IMPRESSION: Findings suspicious  for arteriovenous fistula involving the common femoral artery and vein. Electronically Signed   By: Keith Rake M.D.   On: 05/19/2020 19:30   CT Angio Chest/Abd/Pel for Dissection W and/or Wo Contrast  Result Date: 05/19/2020 CLINICAL DATA:  87 year old male with reported history of  abdominal pain and suspected aortic dissection. EXAM: CT ANGIOGRAPHY CHEST, ABDOMEN AND PELVIS TECHNIQUE: Non-contrast CT of the chest was initially obtained. Multidetector CT imaging through the chest, abdomen and pelvis was performed using the standard protocol during bolus administration of intravenous contrast. Multiplanar reconstructed images and MIPs were obtained and reviewed to evaluate the vascular anatomy. CONTRAST:  41m OMNIPAQUE IOHEXOL 350 MG/ML SOLN COMPARISON:  None FINDINGS: CTA CHEST FINDINGS Cardiovascular: Noncontrast imaging with extensive atheromatous plaque in the thoracic aorta, signs of coronary revascularization and coronary artery disease. No pericardial effusion or intramural hematoma. Aortic caliber in the chest with no evidence of aneurysm. Normal caliber aortic arch. No signs of thoracic aortic dissection. Three-vessel branching pattern. Mild dilation of the RIGHT subclavian artery just past its origin. Heart size is enlarged. Central pulmonary vasculature is unremarkable though with limited assessment based on phase of contrast enhancement. Mediastinum/Nodes: Grossly normal appearance of the esophagus. No axillary lymphadenopathy. No mediastinal lymphadenopathy. No hilar lymphadenopathy. No thoracic inlet adenopathy. No mediastinal hematoma. Lungs/Pleura: Mild subpleural reticulation in the chest. Airways are patent. No sign of consolidation, effusion or pneumothorax. Small pulmonary nodule in the RIGHT lung base at 6 mm (image 111, series 6) another small adjacent nodule similar size on image 108 of series 6. Musculoskeletal: See below for full musculoskeletal detail. Spinal degenerative changes about the thoracic spine visualized clavicles and scapulae are unremarkable. Post median sternotomy. Sternum otherwise normal. Review of the MIP images confirms the above findings. CTA ABDOMEN AND PELVIS FINDINGS VASCULAR Aorta: Moderate calcific and noncalcific atheromatous plaque of the  abdominal aorta. Mild aneurysmal dilation at 3.3 x 2.9 cm greatest dimension. This is in the infrarenal abdominal segment. No signs of dissection or vasculitis. Celiac: Celiac is widely patent with scattered atheromatous plaque within the vessel along its course. No splenic artery aneurysm. Classic hepatic arterial anatomy. SMA: SMA is widely patent with scattered atheromatous changes. No aneurysmal dilation. No sign of vasculitis. Renals: Renal arteries are patent, single renal arteries bilaterally. IMA: IMA is patent. Inflow: Patent without evidence of aneurysm, dissection, vasculitis or significant stenosis. Proximal outflow: Iliac vessels are patent with ectatic RIGHT common iliac artery 1.6 cm. Early filling of the RIGHT femoral vein and abundant collaterals tracking along obturator and body wall pathways. Luminal heterogeneity of the RIGHT common femoral vein more likely heterogeneity with unopacified blood passing into an area of potential AV fistula. This would be a small fistula and the RIGHT iliac venous system is diminutive. Veins: Diminutive RIGHT iliac veins suggest chronic occlusion perhaps from prior DVT given the presence of an IVC filter which is in place just below the level of the renal veins. Review of the MIP images confirms the above findings. NON-VASCULAR Hepatobiliary: Liver without focal, suspicious hepatic lesion. Gallstones layer in the dependent gallbladder. No pericholecystic stranding. Limited assessment on true arterial phase. Pancreas: Normal, without mass, inflammation or ductal dilatation. Spleen: Spleen normal size and contour. Adrenals/Urinary Tract: Adrenal glands are normal. Symmetric renal enhancement without hydronephrosis. Small cyst in the upper pole the LEFT kidney. Signs of renal cortical scarring. Marked distension of the urinary bladder extending into the low abdomen. No perivesical stranding. Stomach/Bowel: Small hiatal hernia. Small periampullary duodenal diverticulum.  The appendix is  normal. Small bowel without obstruction. Colon without acute process and with colonic diverticulosis. Lymphatic: There is no gastrohepatic or hepatoduodenal ligament lymphadenopathy. No retroperitoneal or mesenteric lymphadenopathy. Reproductive: Prostatomegaly with heterogeneity is a nonspecific finding on CT. Other: No ascites.  No free air. Musculoskeletal: Spinal degenerative changes. No acute or destructive bone process. Visualized clavicles and scapulae are unremarkable. No rib fractures, no destructive bone findings. Review of the MIP images confirms the above findings. IMPRESSION: 1. No signs of acute aortic dissection. 2. Marked distension of the urinary bladder in the setting of prostatomegaly strongly suggestive of bladder outlet obstruction, correlate with symptoms to determine whether this could be an alternative cause for the patient's symptoms. 3. Early filling of RIGHT femoral vein suggest small arterial to venous fistula in the area of the RIGHT groin. Filling defects within this area could represent webs from prior thrombus or mixing artifact. 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 Consider sonographic assessment of this area to assess for arteriovenous fistula and to assess for any thrombus in this location. Collateral pathways are present about the pelvis. 4. 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 Could consider the following as clinically warranted. Recommend follow-up ultrasound every 3 years. This recommendation follows ACR consensus guidelines: White Paper of the ACR Incidental Findings Committee II on Vascular Findings. J Am Coll Radiol 2013; 10:789-794. 5. IVC filter in situ. 6. Six mm pulmonary nodule in the RIGHT lung base. Non-contrast chest CT at 6-12 months is recommended. If the nodule is stable at time of repeat CT, then future CT at 18-24 months (from today's scan) is considered  optional for low-risk patients, but is recommended for high-risk patients. This recommendation follows the consensus statement: Guidelines for Management of Incidental Pulmonary Nodules Detected on CT Images: From the Fleischner Society 2017; Radiology 2017; 284:228-243. 7. Cholelithiasis. 8. Aortic atherosclerosis. These results were called by telephone at the time of interpretation on 05/19/2020 at 5:36 pm to provider Duffy Bruce , who verbally acknowledged these results. Aortic Atherosclerosis (ICD10-I70.0). Electronically Signed   By: Zetta Bills M.D.   On: 05/19/2020 17:35    ____________________________________________  PROCEDURES   Procedure(s) performed (including Critical Care):  Procedures  ____________________________________________  INITIAL IMPRESSION / MDM / [place] / ED COURSE  As part of my medical decision making, I reviewed the following data within the [place] notes reviewed and incorporated, Old chart reviewed, Notes from prior ED visits, and [place] Controlled Substance Database       *Leona Pressly was evaluated in Emergency Department on 05/19/2020 for the symptoms described in the history of present illness. He was evaluated in the context of the global COVID-19 pandemic, which necessitated consideration that the patient might be at risk for infection with the SARS-CoV-2 virus that causes COVID-19. Institutional protocols and algorithms that pertain to the evaluation of patients at risk for COVID-19 are in a state of rapid change based on information released by regulatory bodies including the CDC and federal and state organizations. These policies and algorithms were followed during the patient's care in the ED.  Some ED evaluations and interventions may be delayed as a result of limited staffing during the pandemic.*     Medical Decision Making:  87 yo M here with multiple complaints.  Neck pain - likely MSk in etiology,  related to his working out. Resolved w/ tylenol, morphine. CT scan shows degen disease of C  Spine. No signs of UE or LE weakness, numbness. EKG initially showed nonspecific changes, but trop neg x 2 and Cards consulted, does not feel this is cardiac. Labs o/w reassuring. Will treat for MSK neck pain.  Incidental note was made of:  DIstended bladder - he has chronic nocturia, hesitancy. Likely related to BPH. Significant PVR noted. Foley placed, will start flomax and refer to Urology. No UTI symptoms.  AV Fistula R femoral artery - U/S shows small fistula. D/w Dr. Trula Slade. Unlikely to cause any major issues and pt has no signs of CHF, leg pain, leg edema. Can f/u with Cardiology, only needs Vascualr if symptomatic.  ____________________________________________  FINAL CLINICAL IMPRESSION(S) / ED DIAGNOSES  Final diagnoses:  Musculoskeletal neck pain  Arteriovenous fistula (HCC)  Bladder outlet obstruction     MEDICATIONS GIVEN DURING THIS VISIT:  Medications  tamsulosin (FLOMAX) capsule 0.4 mg (has no administration in time range)  acetaminophen (TYLENOL) tablet 1,000 mg (1,000 mg Oral Given 05/19/20 1656)  morphine 2 MG/ML injection 2 mg (2 mg Intravenous Given 05/19/20 1656)  0.9 %  sodium chloride infusion ( Intravenous New Bag/Given 05/19/20 1654)  iohexol (OMNIPAQUE) 350 MG/ML injection 75 mL (75 mLs Intravenous Contrast Given 05/19/20 1624)     ED Discharge Orders         Ordered    acetaminophen (TYLENOL) 325 MG tablet  Every 4 hours PRN        05/19/20 2018    tamsulosin (FLOMAX) 0.4 MG CAPS capsule  Daily after supper        05/19/20 2018    Diclofenac Sodium 3 % GEL  2 times daily        05/19/20 2018           Note:  This document was prepared using Dragon voice recognition software and may include unintentional dictation errors.   Duffy Bruce, MD 05/19/20 2518    Duffy Bruce, MD 05/19/20 [phone]

## 2020-05-19 NOTE — Progress Notes (Signed)
Responded to code STEMI.  Patient has known history of coronary artery disease and presented with neck pain that is worse with certain movements.  He denies any chest pain.  Initial EKG was worrisome for possible inferior ST elevation but subsequent EKGs showed complete resolution.  I suspect there was some motion artifact as well. The patient's symptoms are not consistent with ACS and his repeat EKG does not show any ST elevation.  He does have right bundle branch block and left anterior fascicular block.  Code STEMI was canceled.  Continue work-up as indicated.

## 2020-05-19 NOTE — ED Triage Notes (Addendum)
Pt A&O, ambulatory. States yesterday started with posterior neck pain, states today pain went to both shoulders with leg weakness. Pt states hx HTN but doesn't take meds any more for it.

## 2020-05-19 NOTE — Discharge Instructions (Addendum)
Start the [facility] for urinary retention. Keep the catheter in until follow-up in 2-3 days with Urology.  DO NOT TRY TO PULL THE CATHETER OUT. IT HAS A BALLOON ON THE END THAT WILL CAUSE BLEEDING AND PAIN.  For your neck pain, take tylenol as needed. Apply the VOLTAREN gel 2-3 times daily for pain.  Avoid any heavy exercise for the next week.  Talk to Dr. 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

## 2020-05-19 NOTE — ED Notes (Signed)
MD Isaacs at bedside at this time.

## 2020-05-19 NOTE — ED Notes (Signed)
Pt and son verbalized understanding of d/c instructions at this time. Pt and son deny further questions at this time. Pt assisted to ED entrance via wheelchair at this time, NAD noted, RR even and unlabored at this time.

## 2020-05-19 NOTE — ED Notes (Signed)
Blue top sent down  

## 2020-05-19 NOTE — ED Notes (Signed)
Code  stemi  called  to  [facility] 

## 2020-05-19 NOTE — ED Notes (Signed)
CODE  STEMI  [facility]  PER  DR  ARIDA MD  CALLED  Karen Chafe

## 2020-05-23 ENCOUNTER — Encounter: Payer: Self-pay | Admitting: Dermatology

## 2020-05-23 NOTE — Telephone Encounter (Signed)
Called patient to schedule ER follow up visit. LVM to call back and schedule.

## 2020-05-25 ENCOUNTER — Encounter: Payer: Self-pay | Admitting: Urology

## 2020-05-25 ENCOUNTER — Ambulatory Visit: Payer: Medicare Other | Admitting: Urology

## 2020-05-25 ENCOUNTER — Other Ambulatory Visit: Payer: Self-pay

## 2020-05-25 VITALS — BP 114/63 | HR 72 | Ht 70.0 in | Wt 160.0 lb

## 2020-05-25 DIAGNOSIS — N472 Paraphimosis: Secondary | ICD-10-CM | POA: Diagnosis not present

## 2020-05-25 DIAGNOSIS — R339 Retention of urine, unspecified: Secondary | ICD-10-CM | POA: Diagnosis not present

## 2020-05-25 DIAGNOSIS — R351 Nocturia: Secondary | ICD-10-CM

## 2020-05-25 LAB — BLADDER SCAN AMB NON-IMAGING: Scan Result: 303

## 2020-05-25 NOTE — Progress Notes (Signed)
Patient return this afternoon and was found to have a PVR of 303 cc.  I discussed with him that at this point his 3 options would be to have a Foley catheter placed again, I can instruct him on self-catheterization or he could risk going into retention tonight and an emergency room visit.  He stated he did not want the catheter back in as he found it painful and he did not want to learn how to catheterize himself.  He then attempted to use the restroom once again, but when we scanned him again his PVR was 290.  He then decided to sit in the waiting room for an hour to see if he was able to void further.    Unfortunately, he was unable to void and when we scanned his bladder again he was greater than 400 cc.  I had a discussion with him and he agreed for me to instruct him on self-catheterization.    Using a 12 [place] coud straight catheter from [employer], he cleansed his hand with hand sanitizer and he was able to pass the catheter without incident into the bladder and was able to drain 450 cc out of his bladder.  I instructed him to go ahead and straight cath if he is having suprapubic pain, he goes to void and is unable to or if he has not had any urinary output in 4 hours.  He will follow-up in 1 week.  I spent 15 minutes on the day of the encounter to include pre-visit record review, face-to-face time with the patient, and post-visit ordering of tests.

## 2020-05-25 NOTE — Progress Notes (Signed)
Catheter Removal  Patient is present today for a catheter removal.  85ml of water was drained from the balloon. A 16FR foley cath was removed from the bladder no complications were noted . Patient tolerated well.  Performed by: Elberta Leatherwood, CMA  Follow up/ Additional notes: This afternoon for PVR

## 2020-05-25 NOTE — Telephone Encounter (Signed)
Viewed schedule and pt has been scheduled for 3/25.

## 2020-05-25 NOTE — Progress Notes (Signed)
05/25/2020 8:14 AM   Tommy [name] 1927/03/02 [id number]  Referring provider: Pleas Koch, NP [address],  [place] [address]  Chief Complaint  Patient presents with  . Urinary Retention    HPI: Tommy [name] is a 87 y.o. male who presents for ED follow-up for urinary retention.   [facility] ED visit 05/19/2020 complaining of upper back pain  CT chest/abdomen/pelvis performed for evaluation of possible aortic dissection and incidentally found to have bladder distention  Had no bothersome voiding symptoms or pelvic pain  Foley catheter was placed no volume obtained not recorded  Started on tamsulosin with urology follow-up recommended  No bothersome voiding symptoms for many years   PMH: Past Medical History:  Diagnosis Date  . Anemia   . Carotid artery occlusion    a. 2004 s/p L carotid endarterectomy; b. 02/2019 U/S: 1-39% bilat ICA stenosis. <50% bilat CCA stenosis; c. 02/2020 U/S: 50-69% RICA, s/p L CEA w/o hemodynamically signif narrowing.  . CKD (chronic kidney disease) stage 3, GFR 30-59 ml/min (HCC)   . Coronary artery disease    a. 1994 s/p CABG x 3, [place], [place] (LIMA->LAD, VG->dRCA, VG->unknown vessel); b. 02/2016 Cath: LM 99d, LAD 100ost, LCX 100ost, RCA 80ost, diffuse 85-4m. LIMA->LAD ok, VG->RCA ok. VG->unknown vessel 100.  . Diabetes mellitus without complication ([place])   . Diastolic dysfunction    a. 02/2017 Echo: EF 55-65%, no rwma. Nl DD. Ao sclerosis w/o stenosis. Mod TR. Mild MR; b. 02/2020 Echo: EF 60-65%, no rwma, Gr2 DD, nl RV size/fxn.   Marland Kitchen Dyslipidemia   . GERD (gastroesophageal reflux disease)   . Hemorrhoids   . 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 Hypertension   . Incontinence of feces 10/28/2019  . Pulmonary embolism ([place])   . Thyroid disease     Surgical History: Past Surgical History:  Procedure Laterality Date  . CAROTID ENDARTERECTOMY Left 2004  . CORONARY ARTERY BYPASS GRAFT  1994   [name]  .  TONSILLECTOMY AND ADENOIDECTOMY      Home Medications:  Allergies as of 05/25/2020      Reactions   Heparin Other (See Comments)   "it caused low platelet count and made him have a clot"   Patiromer Other (See Comments)   Sodium Zirconium Cyclosilicate Other (See Comments)      Medication List       Accurate as of May 25, 2020  8:14 AM. If you have any questions, ask your nurse or doctor.        acetaminophen 325 MG tablet Commonly known as: Tylenol Take 2 tablets (650 mg total) by mouth every 4 (four) hours as needed for moderate pain.   ALIGN PREBIOTIC-PROBIOTIC PO Take 1 capsule by mouth daily at 12 noon.   aspirin 81 MG tablet Take 81 mg by mouth at bedtime.   atorvastatin 40 MG tablet Commonly known as: LIPITOR TAKE 1 TABLET BY MOUTH  DAILY   betamethasone valerate 0.1 % cream Commonly known as: VALISONE APPLY TO AFFECTED AREA TWICE A DAY   calcium carbonate 1500 (600 Ca) MG Tabs tablet Commonly known as: OSCAL Take 600 mg of elemental calcium by mouth at bedtime.   cholecalciferol 25 MCG (1000 UNIT) tablet Commonly known as: VITAMIN D3 Take 1,000 Units by mouth at bedtime.   epoetin alfa 10000 UNIT/ML injection Commonly known as: EPOGEN Inject 20,000 Units into the skin as directed. Receives if hemoglobin <10   FISH OIL PO Take 2 capsules by  mouth 2 (two) times daily.   glimepiride 4 MG tablet Commonly known as: AMARYL TAKE 1 TABLET BY MOUTH  DAILY WITH BREAKFAST   insulin glargine 100 UNIT/ML Solostar Pen Commonly known as: LANTUS Inject 6 Units into the skin at bedtime.   Insulin Pen Needle 29G X 12.7MM Misc BD Ultrafine Pen Needle Use as instructed to inject insulin daily   B-D ULTRAFINE III SHORT PEN 31G X 8 MM Misc Generic drug: Insulin Pen Needle USE AS INSTRUCTED TO INJECT INSULIN DAILY.   IRON PO Take 2 tablets by mouth 2 (two) times daily.   JUICE PLUS FIBRE PO Take 1 capsule by mouth daily.   levothyroxine 175 MCG  tablet Commonly known as: SYNTHROID Take 175 mcg by mouth daily before breakfast.   meclizine 12.5 MG tablet Commonly known as: ANTIVERT Take 1 tablet (12.5 mg total) by mouth 3 (three) times daily as needed for dizziness.   multivitamin capsule Take 1 capsule by mouth daily.   nitroGLYCERIN 0.4 MG SL tablet Commonly known as: NITROSTAT Place 0.4 mg under the tongue every 5 (five) minutes as needed for chest pain.   OneTouch Delica Plus [id number] Misc USE AS INSTRUCT TO TEST BLOOD SUGAR ONCE DAILY   OneTouch Verio test strip Generic drug: glucose blood USE AS INSTRUCTED TO TEST BLOOD SUGAR ONCE DAILY   OneTouch Verio w/Device Kit 1 Device by Does not apply route daily. Use as instructed to test blood sugar daily   pantoprazole 20 MG tablet Commonly known as: PROTONIX TAKE 1 TABLET BY MOUTH  DAILY   PROCTOSOL HC RE Place 1 Dose rectally as needed.   ranolazine 1000 MG SR tablet Commonly known as: RANEXA Take 1 tablet (1,000 mg total) by mouth 2 (two) times daily.   tamsulosin 0.4 MG Caps capsule Commonly known as: FLOMAX Take 1 capsule (0.4 mg total) by mouth daily after supper.   timolol 0.5 % ophthalmic solution Commonly known as: TIMOPTIC Place 1 drop into both eyes daily.       Allergies:  Allergies  Allergen Reactions  . Heparin Other (See Comments)    "it caused low platelet count and made him have a clot"  . Patiromer Other (See Comments)  . Sodium Zirconium Cyclosilicate Other (See Comments)    Family History: Family History  Problem Relation Age of Onset  . Heart attack Mother   . Heart disease Mother   . Stroke Mother   . Diabetes Father   . Coronary artery disease Sister     Social History:  reports that he quit smoking about 44 years ago. His smoking use included cigarettes. He has a 37.50 pack-year smoking history. He has never used smokeless tobacco. He reports current alcohol use of about 1.0 standard drink of alcohol per week. He  reports that he does not use drugs.   Physical Exam: BP 114/63   Pulse 72   Ht $R'5\' 10"'zf$  (1.778 m)   Wt 160 lb (72.6 kg)   BMI 22.96 kg/m   Constitutional:  Alert and oriented, No acute distress. HEENT: [place] AT, moist mucus membranes.  Trachea midline, no masses. Cardiovascular: No clubbing, cyanosis, or edema. Respiratory: Normal respiratory effort, no increased work of breathing. GI: Abdomen is soft, nontender, nondistended, no abdominal masses GU: Foley catheter was removed and patient is uncircumcised and foreskin was not retracted back over the penis. Using manual compression of foreskin was able to be reduced with moderate difficulty Lymph: No cervical or inguinal lymphadenopathy. Skin: No rashes,  bruises or suspicious lesions. Neurologic: Grossly intact, no focal deficits, moving all 4 extremities. Psychiatric: Normal mood and affect.    Assessment & Plan:    1.  Urinary retention  Patient was asymptomatic and this is most likely chronic  Catheter was removed and he has a follow-up bladder scan scheduled this afternoon  He most likely will have a baseline residual  CT was reviewed and there was no hydronephrosis or hydroureter  Follow-up with me 1 month for repeat bladder scan and DRE  2.  Paraphimosis  Instructed not to retract his foreskin for at least 7-10 days  Abbie Sons, MD  [facility] [address] [place], [place] [address] [phone]

## 2020-05-26 ENCOUNTER — Ambulatory Visit: Payer: Medicare Other | Admitting: Primary Care

## 2020-05-26 DIAGNOSIS — R339 Retention of urine, unspecified: Secondary | ICD-10-CM | POA: Diagnosis not present

## 2020-05-26 DIAGNOSIS — M542 Cervicalgia: Secondary | ICD-10-CM | POA: Insufficient documentation

## 2020-05-26 DIAGNOSIS — I770 Arteriovenous fistula, acquired: Secondary | ICD-10-CM

## 2020-05-26 DIAGNOSIS — R911 Solitary pulmonary nodule: Secondary | ICD-10-CM | POA: Diagnosis not present

## 2020-05-26 NOTE — Assessment & Plan Note (Signed)
Incidental finding on recent CT angio, located to right lung base.   Repeat CT chest due in 6-12 months.  Will notify patient at the time.

## 2020-05-26 NOTE — Assessment & Plan Note (Signed)
Acute, following with Urology now, self catheterizing.  Patient is taking Unisom OTC for sleep, discussed that this may cause urinary retention and to discontinue. He will update.   Follow up with Urology next week.

## 2020-05-26 NOTE — Assessment & Plan Note (Signed)
Incidental finding on CT angio. Asymptomatic. Patient to continue cardiology follow up.

## 2020-05-26 NOTE — Patient Instructions (Signed)
Stop taking Unisom medication for sleep as it may be contributing to your symptoms of urinary retention.  Follow up with urology as scheduled.   We will be in touch in 6 months regarding the repeat CT chest for the lung nodule.  You may use the diclofenac gel three times daily for neck pain.  It was a pleasure to see you today!

## 2020-05-26 NOTE — Progress Notes (Signed)
Subjective:    Patient ID: Tommy [name], male    DOB: 10-Apr-1926, 87 y.o.   MRN: [id number]  HPI  Antjuan Rothe is a very pleasant 87 y.o. male with a history of CAD, hypertension, hyperlipidemia, CABG, diabetes, CKD, anemia who presents today for emergency department follow-up.  He presented to [facility] ED on 05/19/2020 with reports of positional, sharp, neck and upper back pain 1 day after exercising.  Pain was worse with movement and palpation.    During his stay in the ED he underwent CT C-spine which showed degenerative changes, no acute process.  EKG and labs negative for acute coronary syndrome.  Symptoms were suspected to be secondary from muscular skeletal etiology.  It also appears that he underwent CT angio chest for reports of abdominal pain.  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  Vascular services were consulted who recommended cardiology follow-up, only needs vascular follow-up if symptomatic.  He had no evidence of edema, lower extremity pain, CHF.  Also with distended bladder, significant PVR noted.  Foley catheter was placed, tamsulosin initiated and referral to urology was placed.  He was discharged home later that day.  Since his ED visit he has been evaluated by his urologist yesterday, PVR of 303 cc without Foley catheter.  Options included Foley catheter placement, self-catheterization, he eventually opted for self-catheterization and was instructed how to do so.  He has a follow-up visit scheduled for next week.  He is able to self catheterize himself. He is not urinating without use of the  catheter. He is compliant to tamsulosin daily.   His neck persists which is located to the left lateral neck and posterior shoulder. He was prescribed diclofenac gel 3% with improvement but was advised to take for five days. He denies left upper extremity pain, numbness.   He is needing a new handicap placard as he lost his previous one. He is taking Unisom OTC for sleep.   Review of Systems  Respiratory: Negative for shortness of breath.   Cardiovascular: Negative for chest pain and leg swelling.  Musculoskeletal: Positive for arthralgias and neck pain.  Neurological: Negative for numbness.         Past Medical History:  Diagnosis Date  . Anemia   . Carotid artery occlusion    a. 2004 s/p L carotid endarterectomy; b. 02/2019 U/S: 1-39% bilat ICA stenosis. <50% bilat CCA stenosis; c. 02/2020 U/S: 50-69% RICA, s/p L CEA w/o hemodynamically signif narrowing.  . CKD (chronic kidney disease) stage 3, GFR 30-59 ml/min (HCC)   . Coronary artery disease    a. 1994 s/p CABG x 3, [place], [place] (LIMA->LAD, VG->dRCA, VG->unknown vessel); b. 02/2016 Cath: LM 99d, LAD 100ost, LCX 100ost, RCA 80ost, diffuse 85-47m. LIMA->LAD ok, VG->RCA ok. VG->unknown vessel 100.  . Diabetes mellitus without complication ([place])   . Diastolic dysfunction    a. 02/2017 Echo: EF 55-65%, no rwma. Nl DD. Ao sclerosis w/o stenosis. Mod TR. Mild MR; b. 02/2020 Echo: EF 60-65%, no rwma, Gr2 DD, nl RV size/fxn.   Marland Kitchen Dyslipidemia   . GERD (gastroesophageal reflux disease)   . Hemorrhoids   .  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 Hypertension   . Incontinence of feces 10/28/2019  . Pulmonary embolism ([place])   . Thyroid disease     Social History   Socioeconomic History  . Marital status: Widowed    Spouse name: Not on file  . Number of children: 4  . Years of education: 16  . Highest education level: Bachelor's degree (e.g., BA, AB, BS)  Occupational History  . Occupation: retired  Tobacco Use  . Smoking  status: Former Smoker    Packs/day: 1.50    Years: 25.00    Pack years: 37.50    Types: Cigarettes    Quit date: 1978    Years since quitting: 44.2  . Smokeless tobacco: Never Used  Vaping Use  . Vaping Use: Never used  Substance and Sexual Activity  . Alcohol use: Yes    Alcohol/week: 1.0 standard drink    Types: 1 Glasses of wine per week    Comment: socially   . Drug use: No  . Sexual activity: Not Currently  Other Topics Concern  . Not on file  Social History Narrative   Widower.   4 children, 7 grandchildren, 1 great grandchild.   Retired. Once worked as an account.    Enjoys exercising, spending time with family.    Social Determinants of Health   Financial Resource Strain: Not on file  Food Insecurity: Not on file  Transportation Needs: Not on file  Physical Activity: Not on file  Stress: Not on file  Social Connections: Not on file  Intimate Partner Violence: Not on file    Past Surgical History:  Procedure Laterality Date  . CAROTID ENDARTERECTOMY Left 2004  . CORONARY ARTERY BYPASS GRAFT  1994   [name]  . TONSILLECTOMY AND ADENOIDECTOMY      Family History  Problem Relation Age of Onset  . Heart attack Mother   . Heart disease Mother   . Stroke Mother   . Diabetes Father   . Coronary artery disease Sister     Allergies  Allergen Reactions  . Heparin Other (See Comments)    "it caused low platelet count and made him have a clot"  . Patiromer Other (See Comments)  . Sodium Zirconium Cyclosilicate Other (See Comments)    Current Outpatient Medications on File Prior to Visit  Medication Sig Dispense Refill  . acetaminophen (TYLENOL) 325 MG tablet Take 2 tablets (650 mg total) by mouth every 4 (four) hours as needed for moderate pain. 100 tablet 0  . aspirin 81 MG tablet Take 81 mg by mouth at bedtime.    Marland Kitchen atorvastatin (LIPITOR) 40 MG tablet TAKE 1 TABLET BY MOUTH  DAILY 90 tablet 3  . B-D ULTRAFINE III SHORT PEN 31G X 8 MM MISC USE AS  INSTRUCTED TO INJECT INSULIN DAILY. 100 each 5  . Bacillus Coagulans-Inulin (ALIGN PREBIOTIC-PROBIOTIC PO) Take 1 capsule by mouth daily at 12 noon.    . betamethasone valerate (VALISONE) 0.1 % cream APPLY TO AFFECTED AREA TWICE A DAY 30 g 0  . Blood Glucose Monitoring Suppl (ONETOUCH VERIO) w/Device KIT 1 Device by Does not apply route daily. Use as instructed to test blood sugar daily 1 kit 0  . calcium carbonate (OSCAL) 1500 (600 Ca) MG TABS tablet Take 600 mg of elemental calcium by mouth at bedtime.    . cholecalciferol (VITAMIN D3) 25 MCG (1000 UT) tablet Take 1,000 Units by mouth at bedtime.    Marland Kitchen epoetin alfa (EPOGEN,PROCRIT)  10000 UNIT/ML injection Inject 20,000 Units into the skin as directed. Receives if hemoglobin <10    . glimepiride (AMARYL) 4 MG tablet TAKE 1 TABLET BY MOUTH  DAILY WITH BREAKFAST 120 tablet 2  . Hydrocortisone (PROCTOSOL HC RE) Place 1 Dose rectally as needed.    . Insulin Glargine (LANTUS) 100 UNIT/ML Solostar Pen Inject 6 Units into the skin at bedtime.    . Insulin Pen Needle 29G X 12.7MM MISC BD Ultrafine Pen Needle Use as instructed to inject insulin daily 300 each 1  . IRON PO Take 2 tablets by mouth 2 (two) times daily.    . Lancets (ONETOUCH DELICA PLUS [id number]) MISC USE AS INSTRUCT TO TEST BLOOD SUGAR ONCE DAILY 100 each 5  . levothyroxine (SYNTHROID) 175 MCG tablet Take 175 mcg by mouth daily before breakfast.    . meclizine (ANTIVERT) 12.5 MG tablet Take 1 tablet (12.5 mg total) by mouth 3 (three) times daily as needed for dizziness. 30 tablet 0  . Multiple Vitamin (MULTIVITAMIN) capsule Take 1 capsule by mouth daily.    . nitroGLYCERIN (NITROSTAT) 0.4 MG SL tablet Place 0.4 mg under the tongue every 5 (five) minutes as needed for chest pain.    . Nutritional Supplements (JUICE PLUS FIBRE PO) Take 1 capsule by mouth daily.    . Omega-3 Fatty Acids (FISH OIL PO) Take 2 capsules by mouth 2 (two) times daily.    Glory Rosebush VERIO test strip USE AS INSTRUCTED  TO TEST BLOOD SUGAR ONCE DAILY 100 strip 5  . pantoprazole (PROTONIX) 20 MG tablet TAKE 1 TABLET BY MOUTH  DAILY 120 tablet 2  . ranolazine (RANEXA) 1000 MG SR tablet Take 1 tablet (1,000 mg total) by mouth 2 (two) times daily. 180 tablet 3  . tamsulosin (FLOMAX) 0.4 MG CAPS capsule Take 1 capsule (0.4 mg total) by mouth daily after supper. 30 capsule 0  . timolol (TIMOPTIC) 0.5 % ophthalmic solution Place 1 drop into both eyes daily.  11   No current facility-administered medications on file prior to visit.    BP 118/60   Pulse 65   Temp 97.6 F (36.4 C) (Temporal)   Ht $R'5\' 10"'ZU$  (1.778 m)   Wt 154 lb (69.9 kg)   SpO2 95%   BMI 22.10 kg/m  Objective:   Physical Exam Neck:     Comments: Slight decrease in ROM to left neck rotation, due to pain.  Cardiovascular:     Rate and Rhythm: Normal rate and regular rhythm.  Pulmonary:     Effort: Pulmonary effort is normal.     Breath sounds: Normal breath sounds.  Musculoskeletal:     Cervical back: Neck supple. Pain with movement present. No spinous process tenderness or muscular tenderness. Decreased range of motion.  Skin:    General: Skin is warm and dry.  Neurological:     Mental Status: He is alert and oriented to person, place, and time.           Assessment & Plan:      This visit occurred during the SARS-CoV-2 public health emergency.  Safety protocols were in place, including screening questions prior to the visit, additional usage of staff PPE, and extensive cleaning of exam room while observing appropriate contact time as indicated for disinfecting solutions.

## 2020-05-26 NOTE — Assessment & Plan Note (Signed)
Acute, ED visit for same. Discussed to use diclofenac gel PRN, he does not need a refill.  Would refrain from adding muscle relaxer at this point, especially given urinary retention symptoms. He agrees and will update if symptoms persist.

## 2020-05-28 ENCOUNTER — Encounter: Payer: Self-pay | Admitting: Urology

## 2020-05-31 NOTE — Progress Notes (Signed)
06/01/2020 2:48 PM   Tommy [name] [birth year] [id number]  Referring provider: Pleas Koch, NP [place],  [place] [address]  Chief Complaint  Patient presents with  . Follow-up    1 week follow-up   Urological history: 1. Urinary retention -incidental finding on CT during work up for possible aneurysm dissection 05/19/2020 -managing with CIC  2. Paraphimosis -Reduce manually  HPI: Tommy [name] is a 87 y.o. male who presents today for a follow up after starting CIC.  His PVR is 0 mL today.  He states he caths about 3 times daily.  He has good output with catheterization, but he is unable to urinate spontaneously.  Patient denies any modifying or aggravating factors.  Patient denies any gross hematuria, dysuria or suprapubic/flank pain.  Patient denies any fevers, chills, nausea or vomiting.   PMH: Past Medical History:  Diagnosis Date  . Anemia   . Carotid artery occlusion    a. 2004 s/p L carotid endarterectomy; b. 02/2019 U/S: 1-39% bilat ICA stenosis. <50% bilat CCA stenosis; c. 02/2020 U/S: 50-69% RICA, s/p L CEA w/o hemodynamically signif narrowing.  . CKD (chronic kidney disease) stage 3, GFR 30-59 ml/min (HCC)   . Coronary artery disease    a. 1994 s/p CABG x 3, [place] (LIMA->LAD, VG->dRCA, VG->unknown vessel); b. 02/2016 Cath: LM 99d, LAD 100ost, LCX 100ost, RCA 80ost, diffuse 85-60m LIMA->LAD ok, VG->RCA ok. VG->unknown vessel 100.  . Diabetes mellitus without complication ([place]   . Diastolic dysfunction    a. 02/2017 Echo: EF 55-65%, no rwma. Nl DD. Ao sclerosis w/o stenosis. Mod TR. Mild MR; b. 02/2020 Echo: EF 60-65%, no rwma, Gr2 DD, nl RV size/fxn.   .Marland KitchenDyslipidemia   . GERD (gastroesophageal reflux disease)   . Hemorrhoids   . 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 Hypertension   . Incontinence of feces 10/28/2019  . Pulmonary embolism ([place]   . Thyroid disease     Surgical History: Past Surgical History:   Procedure Laterality Date  . CAROTID ENDARTERECTOMY Left 2004  . CORONARY ARTERY BYPASS GRAFT  1994   [name]  . TONSILLECTOMY AND ADENOIDECTOMY      Home Medications:  Allergies as of 06/01/2020      Reactions   Heparin Other (See Comments)   "it caused low platelet count and made him have a clot"   Patiromer Other (See Comments)   Sodium Zirconium Cyclosilicate Other (See Comments)      Medication List       Accurate as of June 01, 2020  2:48 PM. If you have any questions, ask your nurse or doctor.        acetaminophen 325 MG tablet Commonly known as: Tylenol Take 2 tablets (650 mg total) by mouth every 4 (four) hours as needed for moderate pain.   ALIGN PREBIOTIC-PROBIOTIC PO Take 1 capsule by mouth daily at 12 noon.   aspirin 81 MG tablet Take 81 mg by mouth at bedtime.   atorvastatin 40 MG tablet Commonly known as: LIPITOR TAKE 1 TABLET BY MOUTH  DAILY   betamethasone valerate 0.1 % cream Commonly known as: VALISONE APPLY TO AFFECTED AREA TWICE A DAY   calcium carbonate 1500 (600 Ca) MG Tabs tablet Commonly known as: OSCAL Take 600 mg of elemental calcium by mouth at bedtime.   cholecalciferol 25 MCG (1000 UNIT) tablet Commonly known as: VITAMIN D3 Take 1,000 Units by mouth at bedtime.   Diclofenac Sodium  3 % Gel Apply topically.   epoetin alfa 10000 UNIT/ML injection Commonly known as: EPOGEN Inject 20,000 Units into the skin as directed. Receives if hemoglobin <10   FISH OIL PO Take 2 capsules by mouth 2 (two) times daily.   glimepiride 4 MG tablet Commonly known as: AMARYL TAKE 1 TABLET BY MOUTH  DAILY WITH BREAKFAST   insulin glargine 100 UNIT/ML Solostar Pen Commonly known as: LANTUS Inject 6 Units into the skin at bedtime.   Insulin Pen Needle 29G X 12.7MM Misc BD Ultrafine Pen Needle Use as instructed to inject insulin daily   B-D ULTRAFINE III SHORT PEN 31G X 8 MM Misc Generic drug: Insulin Pen Needle USE AS INSTRUCTED TO  INJECT INSULIN DAILY.   IRON PO Take 2 tablets by mouth 2 (two) times daily.   JUICE PLUS FIBRE PO Take 1 capsule by mouth daily.   levothyroxine 175 MCG tablet Commonly known as: SYNTHROID Take 175 mcg by mouth daily before breakfast.   meclizine 12.5 MG tablet Commonly known as: ANTIVERT Take 1 tablet (12.5 mg total) by mouth 3 (three) times daily as needed for dizziness.   multivitamin capsule Take 1 capsule by mouth daily.   nitroGLYCERIN 0.4 MG SL tablet Commonly known as: NITROSTAT Place 0.4 mg under the tongue every 5 (five) minutes as needed for chest pain.   OneTouch Delica Plus [id number] Misc USE AS INSTRUCT TO TEST BLOOD SUGAR ONCE DAILY   OneTouch Verio test strip Generic drug: glucose blood USE AS INSTRUCTED TO TEST BLOOD SUGAR ONCE DAILY   OneTouch Verio w/Device Kit 1 Device by Does not apply route daily. Use as instructed to test blood sugar daily   pantoprazole 20 MG tablet Commonly known as: PROTONIX TAKE 1 TABLET BY MOUTH  DAILY   PROCTOSOL HC RE Place 1 Dose rectally as needed.   ranolazine 1000 MG SR tablet Commonly known as: RANEXA Take 1 tablet (1,000 mg total) by mouth 2 (two) times daily.   tamsulosin 0.4 MG Caps capsule Commonly known as: FLOMAX Take 1 capsule (0.4 mg total) by mouth daily after supper.   timolol 0.5 % ophthalmic solution Commonly known as: TIMOPTIC Place 1 drop into both eyes daily.       Allergies:  Allergies  Allergen Reactions  . Heparin Other (See Comments)    "it caused low platelet count and made him have a clot"  . Patiromer Other (See Comments)  . Sodium Zirconium Cyclosilicate Other (See Comments)    Family History: Family History  Problem Relation Age of Onset  . Heart attack Mother   . Heart disease Mother   . Stroke Mother   . Diabetes Father   . Coronary artery disease Sister     Social History:  reports that he quit smoking about 44 years ago. His smoking use included cigarettes. He has  a 37.50 pack-year smoking history. He has never used smokeless tobacco. He reports current alcohol use of about 1.0 standard drink of alcohol per week. He reports that he does not use drugs.  ROS: Pertinent ROS in HPI  Physical Exam: BP (!) 153/70   Pulse 67   Ht _0  (1.778 m)   Wt 155 lb (70.3 kg)   BMI 22.24 kg/m   Constitutional:  Well nourished. Alert and oriented, No acute distress. HEENT: [place] AT, mask in place.  Trachea midline Cardiovascular: No clubbing, cyanosis, or edema. Respiratory: Normal respiratory effort, no increased work of breathing. Neurologic: Grossly intact, no focal deficits,  moving all 4 extremities. Psychiatric: Normal mood and affect.  Laboratory Data: No new labs since last visit  I have reviewed the labs.   Pertinent Imaging: Results for NATHANYL, ANDUJO (MRN [id number]) as of 06/01/2020 14:47  Ref. Range 06/01/2020 14:13  Scan Result Unknown 0 ml     Assessment & Plan:    1. Urinary retention -continue CIC -script send for catheters to [facility]  2. Paraphimosis -resolving   Return for keep appointment with Dr. Bernardo Heater on 04/28.  These notes generated with voice recognition software. I apologize for typographical errors.  Zara Council, PA-C  [facility] [address]  [place], [place] [address] [phone]

## 2020-06-01 ENCOUNTER — Encounter: Payer: Self-pay | Admitting: Urology

## 2020-06-01 ENCOUNTER — Ambulatory Visit: Payer: Medicare Other | Admitting: Urology

## 2020-06-01 ENCOUNTER — Other Ambulatory Visit: Payer: Self-pay

## 2020-06-01 VITALS — BP 153/70 | HR 67 | Ht 70.0 in | Wt 155.0 lb

## 2020-06-01 DIAGNOSIS — N472 Paraphimosis: Secondary | ICD-10-CM

## 2020-06-01 DIAGNOSIS — R339 Retention of urine, unspecified: Secondary | ICD-10-CM

## 2020-06-01 LAB — BLADDER SCAN AMB NON-IMAGING: Scan Result: 0

## 2020-06-02 ENCOUNTER — Encounter (HOSPITAL_COMMUNITY): Payer: Self-pay | Admitting: Internal Medicine

## 2020-06-02 ENCOUNTER — Emergency Department (HOSPITAL_COMMUNITY): Payer: Medicare Other

## 2020-06-02 ENCOUNTER — Inpatient Hospital Stay (HOSPITAL_COMMUNITY)
Admission: EM | Admit: 2020-06-02 | Discharge: 2020-06-05 | DRG: 690 | Disposition: A | Discharge status: Home Health | Payer: Medicare Other | Attending: Internal Medicine | Admitting: Internal Medicine

## 2020-06-02 DIAGNOSIS — R531 Weakness: Secondary | ICD-10-CM | POA: Diagnosis not present

## 2020-06-02 DIAGNOSIS — Z951 Presence of aortocoronary bypass graft: Secondary | ICD-10-CM

## 2020-06-02 DIAGNOSIS — D631 Anemia in chronic kidney disease: Secondary | ICD-10-CM | POA: Diagnosis present

## 2020-06-02 DIAGNOSIS — E119 Type 2 diabetes mellitus without complications: Secondary | ICD-10-CM

## 2020-06-02 DIAGNOSIS — N3000 Acute cystitis without hematuria: Secondary | ICD-10-CM | POA: Diagnosis not present

## 2020-06-02 DIAGNOSIS — B964 Proteus (mirabilis) (morganii) as the cause of diseases classified elsewhere: Secondary | ICD-10-CM | POA: Diagnosis present

## 2020-06-02 DIAGNOSIS — N179 Acute kidney failure, unspecified: Secondary | ICD-10-CM | POA: Diagnosis not present

## 2020-06-02 DIAGNOSIS — Z79890 Hormone replacement therapy (postmenopausal): Secondary | ICD-10-CM

## 2020-06-02 DIAGNOSIS — E871 Hypo-osmolality and hyponatremia: Secondary | ICD-10-CM | POA: Diagnosis present

## 2020-06-02 DIAGNOSIS — E785 Hyperlipidemia, unspecified: Secondary | ICD-10-CM | POA: Diagnosis present

## 2020-06-02 DIAGNOSIS — Z20822 Contact with and (suspected) exposure to covid-19: Secondary | ICD-10-CM | POA: Diagnosis present

## 2020-06-02 DIAGNOSIS — Z87891 Personal history of nicotine dependence: Secondary | ICD-10-CM

## 2020-06-02 DIAGNOSIS — W08XXXA Fall from other furniture, initial encounter: Secondary | ICD-10-CM | POA: Diagnosis present

## 2020-06-02 DIAGNOSIS — E1122 Type 2 diabetes mellitus with diabetic chronic kidney disease: Secondary | ICD-10-CM | POA: Diagnosis present

## 2020-06-02 DIAGNOSIS — E1151 Type 2 diabetes mellitus with diabetic peripheral angiopathy without gangrene: Secondary | ICD-10-CM | POA: Diagnosis present

## 2020-06-02 DIAGNOSIS — N1832 Chronic kidney disease, stage 3b: Secondary | ICD-10-CM | POA: Diagnosis not present

## 2020-06-02 DIAGNOSIS — I130 Hypertensive heart and chronic kidney disease with heart failure and stage 1 through stage 4 chronic kidney disease, or unspecified chronic kidney disease: Secondary | ICD-10-CM | POA: Diagnosis present

## 2020-06-02 DIAGNOSIS — R339 Retention of urine, unspecified: Secondary | ICD-10-CM | POA: Diagnosis present

## 2020-06-02 DIAGNOSIS — E039 Hypothyroidism, unspecified: Secondary | ICD-10-CM | POA: Diagnosis present

## 2020-06-02 DIAGNOSIS — Z7982 Long term (current) use of aspirin: Secondary | ICD-10-CM

## 2020-06-02 DIAGNOSIS — I5032 Chronic diastolic (congestive) heart failure: Secondary | ICD-10-CM | POA: Diagnosis present

## 2020-06-02 DIAGNOSIS — Z833 Family history of diabetes mellitus: Secondary | ICD-10-CM

## 2020-06-02 DIAGNOSIS — Z79899 Other long term (current) drug therapy: Secondary | ICD-10-CM

## 2020-06-02 DIAGNOSIS — N319 Neuromuscular dysfunction of bladder, unspecified: Secondary | ICD-10-CM | POA: Diagnosis present

## 2020-06-02 DIAGNOSIS — Z8249 Family history of ischemic heart disease and other diseases of the circulatory system: Secondary | ICD-10-CM

## 2020-06-02 DIAGNOSIS — Z888 Allergy status to other drugs, medicaments and biological substances status: Secondary | ICD-10-CM

## 2020-06-02 DIAGNOSIS — I2510 Atherosclerotic heart disease of native coronary artery without angina pectoris: Secondary | ICD-10-CM | POA: Diagnosis present

## 2020-06-02 DIAGNOSIS — Z794 Long term (current) use of insulin: Secondary | ICD-10-CM

## 2020-06-02 DIAGNOSIS — N390 Urinary tract infection, site not specified: Secondary | ICD-10-CM | POA: Diagnosis present

## 2020-06-02 DIAGNOSIS — Z86711 Personal history of pulmonary embolism: Secondary | ICD-10-CM

## 2020-06-02 DIAGNOSIS — S0990XA Unspecified injury of head, initial encounter: Secondary | ICD-10-CM

## 2020-06-02 DIAGNOSIS — Z7984 Long term (current) use of oral hypoglycemic drugs: Secondary | ICD-10-CM

## 2020-06-02 DIAGNOSIS — Z66 Do not resuscitate: Secondary | ICD-10-CM | POA: Diagnosis present

## 2020-06-02 DIAGNOSIS — N183 Chronic kidney disease, stage 3 unspecified: Secondary | ICD-10-CM | POA: Diagnosis present

## 2020-06-02 DIAGNOSIS — K219 Gastro-esophageal reflux disease without esophagitis: Secondary | ICD-10-CM | POA: Diagnosis present

## 2020-06-02 DIAGNOSIS — R911 Solitary pulmonary nodule: Secondary | ICD-10-CM | POA: Diagnosis present

## 2020-06-02 DIAGNOSIS — Z95828 Presence of other vascular implants and grafts: Secondary | ICD-10-CM

## 2020-06-02 DIAGNOSIS — N189 Chronic kidney disease, unspecified: Secondary | ICD-10-CM

## 2020-06-02 LAB — CBC WITH DIFFERENTIAL/PLATELET
Abs Immature Granulocytes: 0.2 10*3/uL — ABNORMAL HIGH (ref 0.00–0.07)
Basophils Absolute: 0 10*3/uL (ref 0.0–0.1)
Basophils Relative: 0 %
Eosinophils Absolute: 0.1 10*3/uL (ref 0.0–0.5)
Eosinophils Relative: 1 %
HCT: 27.9 % — ABNORMAL LOW (ref 39.0–52.0)
Hemoglobin: 9.2 g/dL — ABNORMAL LOW (ref 13.0–17.0)
Immature Granulocytes: 1 %
Lymphocytes Relative: 5 %
Lymphs Abs: 0.7 10*3/uL (ref 0.7–4.0)
MCH: 34.3 pg — ABNORMAL HIGH (ref 26.0–34.0)
MCHC: 33 g/dL (ref 30.0–36.0)
MCV: 104.1 fL — ABNORMAL HIGH (ref 80.0–100.0)
Monocytes Absolute: 0.9 10*3/uL (ref 0.1–1.0)
Monocytes Relative: 6 %
Neutro Abs: 13 10*3/uL — ABNORMAL HIGH (ref 1.7–7.7)
Neutrophils Relative %: 87 %
Platelets: 241 10*3/uL (ref 150–400)
RBC: 2.68 MIL/uL — ABNORMAL LOW (ref 4.22–5.81)
RDW: 12.4 % (ref 11.5–15.5)
WBC: 14.9 10*3/uL — ABNORMAL HIGH (ref 4.0–10.5)
nRBC: 0 % (ref 0.0–0.2)

## 2020-06-02 LAB — URINALYSIS, ROUTINE W REFLEX MICROSCOPIC
Bilirubin Urine: NEGATIVE
Glucose, UA: NEGATIVE mg/dL
Hgb urine dipstick: NEGATIVE
Ketones, ur: NEGATIVE mg/dL
Nitrite: NEGATIVE
Protein, ur: 100 mg/dL — AB
Specific Gravity, Urine: 1.011 (ref 1.005–1.030)
pH: 9 — ABNORMAL HIGH (ref 5.0–8.0)

## 2020-06-02 LAB — COMPREHENSIVE METABOLIC PANEL
ALT: 42 U/L (ref 0–44)
AST: 54 U/L — ABNORMAL HIGH (ref 15–41)
Albumin: 2.5 g/dL — ABNORMAL LOW (ref 3.5–5.0)
Alkaline Phosphatase: 97 U/L (ref 38–126)
Anion gap: 10 (ref 5–15)
BUN: 45 mg/dL — ABNORMAL HIGH (ref 8–23)
CO2: 24 mmol/L (ref 22–32)
Calcium: 9.2 mg/dL (ref 8.9–10.3)
Chloride: 97 mmol/L — ABNORMAL LOW (ref 98–111)
Creatinine, Ser: 1.92 mg/dL — ABNORMAL HIGH (ref 0.61–1.24)
GFR, Estimated: 32 mL/min — ABNORMAL LOW (ref >60)
Glucose, Bld: 161 mg/dL — ABNORMAL HIGH (ref 70–99)
Potassium: 4.8 mmol/L (ref 3.5–5.1)
Sodium: 131 mmol/L — ABNORMAL LOW (ref 135–145)
Total Bilirubin: 0.6 mg/dL (ref 0.3–1.2)
Total Protein: 7.2 g/dL (ref 6.5–8.1)

## 2020-06-02 LAB — CBG MONITORING, ED: Glucose-Capillary: 155 mg/dL — ABNORMAL HIGH (ref 70–99)

## 2020-06-02 LAB — TROPONIN I (HIGH SENSITIVITY): Troponin I (High Sensitivity): 28 ng/L — ABNORMAL HIGH (ref <18)

## 2020-06-02 LAB — RESP PANEL BY RT-PCR (FLU A&B, COVID) ARPGX2
Influenza A by PCR: NEGATIVE
Influenza B by PCR: NEGATIVE
SARS Coronavirus 2 by RT PCR: NEGATIVE

## 2020-06-02 LAB — GLUCOSE, CAPILLARY: Glucose-Capillary: 209 mg/dL — ABNORMAL HIGH (ref 70–99)

## 2020-06-02 LAB — HEMOGLOBIN A1C
Hgb A1c MFr Bld: 7.1 % — ABNORMAL HIGH (ref 4.8–5.6)
Mean Plasma Glucose: 157.07 mg/dL

## 2020-06-02 MED ORDER — FERROUS SULFATE 325 (65 FE) MG PO TABS
325.0000 mg | ORAL_TABLET | Freq: Every evening | ORAL | Status: DC
  Administered 2020-06-02 – 2020-06-04 (×3): 325 mg via ORAL
  Filled 2020-06-02 (×3): qty 1

## 2020-06-02 MED ORDER — FERROUS SULFATE 325 (65 FE) MG PO TABS
650.0000 mg | ORAL_TABLET | Freq: Every day | ORAL | Status: DC
  Administered 2020-06-03 – 2020-06-05 (×3): 650 mg via ORAL
  Filled 2020-06-02 (×3): qty 2

## 2020-06-02 MED ORDER — ASPIRIN EC 81 MG PO TBEC
81.0000 mg | DELAYED_RELEASE_TABLET | Freq: Every day | ORAL | Status: DC
  Administered 2020-06-02 – 2020-06-04 (×3): 81 mg via ORAL
  Filled 2020-06-02 (×3): qty 1

## 2020-06-02 MED ORDER — SODIUM CHLORIDE 0.45 % IV SOLN: INTRAVENOUS | Status: DC

## 2020-06-02 MED ORDER — PSYLLIUM 95 % PO PACK
1.0000 | PACK | Freq: Every day | ORAL | Status: DC
  Administered 2020-06-03 – 2020-06-05 (×3): 1 via ORAL
  Filled 2020-06-02 (×4): qty 1

## 2020-06-02 MED ORDER — ATORVASTATIN CALCIUM 40 MG PO TABS
40.0000 mg | ORAL_TABLET | Freq: Every day | ORAL | Status: DC
  Administered 2020-06-02 – 2020-06-04 (×3): 40 mg via ORAL
  Filled 2020-06-02 (×3): qty 1

## 2020-06-02 MED ORDER — SODIUM CHLORIDE 0.9 % IV SOLN
1.0000 g | Freq: Once | INTRAVENOUS | Status: AC
  Administered 2020-06-02: 1 g via INTRAVENOUS
  Filled 2020-06-02: qty 10

## 2020-06-02 MED ORDER — TAMSULOSIN HCL 0.4 MG PO CAPS
0.4000 mg | ORAL_CAPSULE | Freq: Every day | ORAL | Status: DC
  Administered 2020-06-02 – 2020-06-04 (×3): 0.4 mg via ORAL
  Filled 2020-06-02 (×3): qty 1

## 2020-06-02 MED ORDER — ACETAMINOPHEN 325 MG PO TABS: 650.0000 mg | ORAL_TABLET | Freq: Four times a day (QID) | ORAL | Status: DC | PRN

## 2020-06-02 MED ORDER — ENOXAPARIN SODIUM 30 MG/0.3ML ~~LOC~~ SOLN: 30.0000 mg | SUBCUTANEOUS | Status: DC

## 2020-06-02 MED ORDER — RANOLAZINE ER 500 MG PO TB12
1000.0000 mg | ORAL_TABLET | Freq: Two times a day (BID) | ORAL | Status: DC
  Administered 2020-06-02 – 2020-06-03 (×2): 1000 mg via ORAL
  Filled 2020-06-02 (×3): qty 2

## 2020-06-02 MED ORDER — GLIMEPIRIDE 4 MG PO TABS
4.0000 mg | ORAL_TABLET | Freq: Every day | ORAL | Status: DC
  Filled 2020-06-02: qty 1

## 2020-06-02 MED ORDER — PANTOPRAZOLE SODIUM 20 MG PO TBEC
20.0000 mg | DELAYED_RELEASE_TABLET | Freq: Every day | ORAL | Status: DC
  Administered 2020-06-02 – 2020-06-05 (×4): 20 mg via ORAL
  Filled 2020-06-02 (×4): qty 1

## 2020-06-02 MED ORDER — INSULIN GLARGINE 100 UNIT/ML ~~LOC~~ SOLN
6.0000 [IU] | Freq: Every day | SUBCUTANEOUS | Status: DC
  Administered 2020-06-02 – 2020-06-04 (×3): 6 [IU] via SUBCUTANEOUS
  Filled 2020-06-02 (×4): qty 0.06

## 2020-06-02 MED ORDER — SENNA 8.6 MG PO TABS
1.0000 | ORAL_TABLET | Freq: Two times a day (BID) | ORAL | Status: DC
  Administered 2020-06-03 – 2020-06-05 (×5): 8.6 mg via ORAL
  Filled 2020-06-02 (×5): qty 1

## 2020-06-02 MED ORDER — LEVOTHYROXINE SODIUM 75 MCG PO TABS
175.0000 ug | ORAL_TABLET | Freq: Every day | ORAL | Status: DC
  Administered 2020-06-03 – 2020-06-05 (×3): 175 ug via ORAL
  Filled 2020-06-02 (×3): qty 1

## 2020-06-02 MED ORDER — NITROGLYCERIN 0.4 MG SL SUBL: 0.4000 mg | SUBLINGUAL_TABLET | SUBLINGUAL | Status: DC | PRN

## 2020-06-02 MED ORDER — FERROUS SULFATE 325 (65 FE) MG PO TABS: 325.0000 mg | ORAL_TABLET | ORAL | Status: DC

## 2020-06-02 MED ORDER — SODIUM CHLORIDE 0.9 % IV BOLUS
500.0000 mL | Freq: Once | INTRAVENOUS | Status: AC
  Administered 2020-06-02: 500 mL via INTRAVENOUS

## 2020-06-02 MED ORDER — ACETAMINOPHEN 650 MG RE SUPP: 650.0000 mg | Freq: Four times a day (QID) | RECTAL | Status: DC | PRN

## 2020-06-02 MED ORDER — INSULIN ASPART 100 UNIT/ML ~~LOC~~ SOLN
0.0000 [IU] | Freq: Three times a day (TID) | SUBCUTANEOUS | Status: DC
  Administered 2020-06-02: 3 [IU] via SUBCUTANEOUS
  Administered 2020-06-03 (×2): 2 [IU] via SUBCUTANEOUS
  Administered 2020-06-03 – 2020-06-04 (×2): 3 [IU] via SUBCUTANEOUS
  Administered 2020-06-04: 2 [IU] via SUBCUTANEOUS
  Administered 2020-06-04: 3 [IU] via SUBCUTANEOUS
  Administered 2020-06-05: 2 [IU] via SUBCUTANEOUS

## 2020-06-02 NOTE — ED Notes (Signed)
Pt transported to CT ?

## 2020-06-02 NOTE — ED Notes (Signed)
hospitalist at bedside

## 2020-06-02 NOTE — H&P (Signed)
History and Physical    Tommy [name] XNA:[id number] DOB: 1926/07/05 DOA: 06/02/2020  PCP: Pleas Koch, NP (Confirm with patient/family/NH records and if not entered, this has to be entered at [facility] point of entry) Patient coming from: home  I have personally briefly reviewed patient's old medical records in [place]  Chief Complaint: weakness, dysuria  HPI: Tommy [name] is a 87 y.o. 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 For a week he has been feeling weaker than usual. He c/o dysuria. He was seen for urology f/u 06/01/20 and was thought to be stable but still requiring CIC. Today he was doing self-cath and was to weak to complete the procedure and fell of the commode. EMS activated and he is transported to [facility] for evaluation.  ED Course: T 97.9 157/79  HR 67  RR 13, EDMD exam notable for frehead abrasion left w/o hematoma. Exam otherwise nl.  CT head w/o acute abnl. Cmet with glucose 161 Cr 1.92 ( baseline 1.40-1.50) Hgb 9.2 - chronic, WBC 14.9 with 87/5/6. U/A large LE, many bacteria, 21-50 wbc/hpf. CXR with left basilar atelectasis. Patient started on ceftriaxone. TRH called to admit for continued treatment.   Review of Systems: As per HPI otherwise 10 point review of systems negative.    Past Medical History:  Diagnosis Date  . Anemia   . Carotid artery occlusion    a. 2004 s/p L carotid endarterectomy; b. 02/2019 U/S: 1-39% bilat ICA stenosis. <50% bilat CCA stenosis; c. 02/2020 U/S: 50-69% RICA, s/p L CEA w/o hemodynamically signif narrowing.  . CKD (chronic kidney disease) stage 3, GFR 30-59 ml/min (HCC)   . Coronary artery disease    a. 1994 s/p CABG x 3, [place] (LIMA->LAD, VG->dRCA, VG->unknown vessel); b. 02/2016 Cath: LM 99d, LAD 100ost, LCX 100ost, RCA 80ost, diffuse 85-87m. LIMA->LAD ok, VG->RCA ok. VG->unknown vessel 100.  . Diabetes mellitus without complication ([place])   .  Diastolic dysfunction    a. 02/2017 Echo: EF 55-65%, no rwma. Nl DD. Ao sclerosis w/o stenosis. Mod TR. Mild MR; b. 02/2020 Echo: EF 60-65%, no rwma, Gr2 DD, nl RV size/fxn.   Marland Kitchen Dyslipidemia   . GERD (gastroesophageal reflux disease)   . Hemorrhoids   . 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 Hypertension   . Incontinence of feces 10/28/2019  . Pulmonary embolism ([place])   . Thyroid disease     Past Surgical History:  Procedure Laterality Date  . CAROTID ENDARTERECTOMY Left 2004  . CORONARY ARTERY BYPASS GRAFT  1994   [name]  . TONSILLECTOMY AND ADENOIDECTOMY      Soc Hx - 1 marriage 50 years - widowed, 2nd marriage 66 years - widowed, 3 rd marriage 35 years. He has 4 children, 7 grandchildren, 1 great-grandchild. 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 Currently lives with son and daughter-in-law.    reports that he quit smoking about 44 years ago. His smoking use included cigarettes. He has a 37.50 pack-year smoking history. He has never used smokeless tobacco. He reports current alcohol use of about 1.0 standard drink of alcohol per week. He reports that he does not use drugs.  Allergies  Allergen Reactions  . Heparin Other (See Comments)    "it caused low platelet count and made him have a clot"  . Patiromer Other (See Comments)    Unknown reaction  . Sodium Zirconium Cyclosilicate Other (See Comments)  Unknown reaction    Family History  Problem Relation Age of Onset  . Heart attack Mother   . Heart disease Mother   . Stroke Mother   . Diabetes Father   . Coronary artery disease Sister      Prior to Admission medications   Medication Sig Start Date End Date Taking? Authorizing Provider  aspirin EC 81 MG tablet Take 81 mg by mouth at bedtime. Swallow whole.   Yes [provider]  atorvastatin (LIPITOR) 40 MG tablet TAKE 1 TABLET BY MOUTH  DAILY Patient taking differently: Take 40 mg by mouth at bedtime. 11/01/19  Yes Pleas Koch, NP  betamethasone valerate (VALISONE) 0.1 % cream APPLY TO AFFECTED AREA TWICE A DAY Patient taking differently: Apply 1 application topically daily. 03/30/20  Yes Pleas Koch, NP  calcium carbonate (OSCAL) 1500 (600 Ca) MG TABS tablet Take 600 mg of elemental calcium by mouth at bedtime.   Yes [provider]  cholecalciferol (VITAMIN D3) 25 MCG (1000 UT) tablet Take 1,000 Units by mouth at bedtime.   Yes [provider]  diclofenac Sodium (VOLTAREN) 1 % GEL Apply 1 application topically daily as needed (pain).   Yes [provider]  epoetin alfa (EPOGEN,PROCRIT) 46568 UNIT/ML injection Inject 20,000 Units into the skin See admin instructions. Inject 20000 units subcutaneously  if hemoglobin <10 - checked monthly   Yes [provider]  ferrous sulfate 325 (65 FE) MG tablet Take 325-650 mg by mouth See admin instructions. Take 2 tablets (650 mg) by mouth every morning and 1 tablet (325 mg) at night   Yes [provider]  glimepiride (AMARYL) 4 MG tablet TAKE 1 TABLET BY MOUTH  DAILY WITH BREAKFAST Patient taking differently: Take 4 mg by mouth daily with breakfast. 01/04/20  Yes Pleas Koch, NP  Hydrocortisone (PROCTOSOL HC RE) Place 1 Dose rectally daily as needed (hemorrhoids/itching).   Yes [provider]  Insulin Glargine (LANTUS) 100 UNIT/ML Solostar Pen Inject 6 Units into the skin at bedtime.   Yes [provider]  levothyroxine (SYNTHROID) 175 MCG tablet Take 175 mcg by mouth daily before breakfast.   Yes [provider]  Multiple Vitamin (MULTIVITAMIN WITH MINERALS) TABS tablet Take 1 tablet by mouth daily.   Yes [provider]  nitroGLYCERIN (NITROSTAT) 0.4 MG SL tablet Place 0.4 mg under the tongue every 5 (five) minutes as needed for chest pain.   Yes [provider]  Omega-3 Fatty Acids (FISH OIL) 1200 MG CAPS Take 2,400 mg by mouth 2 (two) times daily.   Yes [provider]  pantoprazole (PROTONIX) 20 MG tablet TAKE 1 TABLET BY MOUTH  DAILY Patient taking differently: Take 20 mg by mouth daily. 11/12/19  Yes Pleas Koch, NP  Probiotic Product (ALIGN PO) Take 1 tablet by mouth daily.   Yes [provider]  Psyllium (METAMUCIL PO) Take by mouth See admin instructions. Mix one heaping teaspoonful in apple juice and drink daily   Yes [provider]  ranolazine (RANEXA) 1000 MG SR tablet Take 1 tablet (1,000 mg total) by mouth 2 (two) times daily. 02/28/20  Yes End, Harrell Gave, MD  tamsulosin (FLOMAX) 0.4 MG CAPS capsule Take 1 capsule (0.4 mg total) by mouth daily after supper. 05/19/20 06/18/20 Yes Duffy Bruce, MD  timolol (TIMOPTIC) 0.5 % ophthalmic solution Place 1 drop into both eyes daily. 03/17/17  Yes [provider]  acetaminophen (TYLENOL) 325 MG tablet Take 2 tablets (650  mg total) by mouth every 4 (four) hours as needed for moderate pain. Patient not taking: No sig reported 05/19/20 05/19/21  Duffy Bruce, MD  B-D ULTRAFINE III SHORT PEN 31G X 8 MM MISC USE AS INSTRUCTED TO INJECT INSULIN DAILY. 03/24/20   Pleas Koch, NP  Blood Glucose Monitoring Suppl (ONETOUCH VERIO) w/Device KIT 1 Device by Does not apply route daily. Use as instructed to test blood sugar daily 03/31/20   Pleas Koch, NP  Insulin Pen Needle 29G X 12.7MM MISC BD Ultrafine Pen Needle Use as instructed to inject insulin daily 02/18/17   Pleas Koch, NP  Lancets [facility] DELICA PLUS [id number]) MISC USE AS INSTRUCT TO TEST BLOOD SUGAR ONCE DAILY 07/05/19   Pleas Koch, NP  meclizine (ANTIVERT) 12.5 MG tablet Take 1 tablet (12.5 mg total) by mouth 3 (three) times daily as needed for dizziness. Patient not taking: No sig reported 02/10/20   Pleas Koch, NP  [facility] VERIO test strip USE AS INSTRUCTED TO TEST BLOOD SUGAR ONCE DAILY 07/26/19   Pleas Koch, NP    Physical Exam: Vitals:   06/02/20 1500 06/02/20  1515 06/02/20 1530 06/02/20 1600  BP: (!) 157/79 (!) 154/74 (!) 175/64 (!) 136/93  Pulse: 67 67 72 70  Resp: $Remo'13 17 16   'cIdYp$ Temp:      TempSrc:      SpO2: 99% 98% 99% 100%  Weight:      Height:         Vitals:   06/02/20 1500 06/02/20 1515 06/02/20 1530 06/02/20 1600  BP: (!) 157/79 (!) 154/74 (!) 175/64 (!) 136/93  Pulse: 67 67 72 70  Resp: $Remo'13 17 16   'IavuG$ Temp:      TempSrc:      SpO2: 99% 98% 99% 100%  Weight:      Height:       General: very thin elderly man in no distress Eyes: PERRL, lids and conjunctivae normal ENMT: Mucous membranes are dry. Posterior pharynx clear of any exudate or lesions.Normal dentition.  Neck: normal, supple, no masses, no thyromegaly Chest: - well healed sternotomy scar, mild pectus excavatum Respiratory: clear to auscultation bilaterally, no wheezing, no crackles. Normal respiratory effort. No accessory muscle use.  Cardiovascular: Regular rate and rhythm, no murmurs / rubs / gallops. No extremity edema. 2+ pedal pulses. No carotid bruits.  Abdomen: no tenderness, no masses palpated. No hepatosplenomegaly. Bowel sounds positive.  Musculoskeletal: no clubbing / cyanosis. No joint deformity upper and lower extremities. Good ROM, no contractures. Decreased muscle tone.  Skin: no rashes, no ulcers. No induration. Capillary fragility with multiple bruises, rare skin tears. Neurologic: CN 2-12 nl facial symmetry and movement, no deviation of the tongue, EOMI intact, nl shrug. MAE to command. Grip strength 5/5, ble to lift legs off bed.  Sensation intact. Marland Kitchen  Psychiatric: Normal judgment and insight. Alert and oriented x 3. Normal mood.     Labs on Admission: I have personally reviewed following labs and imaging studies  CBC: Recent Labs  Lab 06/02/20 1248  WBC 14.9*  NEUTROABS 13.0*  HGB 9.2*  HCT 27.9*  MCV 104.1*  PLT 818   Basic Metabolic Panel: Recent Labs  Lab 06/02/20 1248  NA 131*  K 4.8  CL 97*  CO2 24  GLUCOSE 161*  BUN 45*   CREATININE 1.92*  CALCIUM 9.2   GFR: Estimated Creatinine Clearance: 24.9 mL/min (A) (by C-G formula based on SCr of 1.92 mg/dL (H)). Liver Function  Tests: Recent Labs  Lab 06/02/20 1248  AST 54*  ALT 42  ALKPHOS 97  BILITOT 0.6  PROT 7.2  ALBUMIN 2.5*   No results for input(s): LIPASE, AMYLASE in the last 168 hours. No results for input(s): AMMONIA in the last 168 hours. Coagulation Profile: No results for input(s): INR, PROTIME in the last 168 hours. Cardiac Enzymes: No results for input(s): CKTOTAL, CKMB, CKMBINDEX, TROPONINI in the last 168 hours. BNP (last 3 results) No results for input(s): PROBNP in the last 8760 hours. HbA1C: No results for input(s): HGBA1C in the last 72 hours. CBG: No results for input(s): GLUCAP in the last 168 hours. Lipid Profile: No results for input(s): CHOL, HDL, LDLCALC, TRIG, CHOLHDL, LDLDIRECT in the last 72 hours. Thyroid Function Tests: No results for input(s): TSH, T4TOTAL, FREET4, T3FREE, THYROIDAB in the last 72 hours. Anemia Panel: No results for input(s): VITAMINB12, FOLATE, FERRITIN, TIBC, IRON, RETICCTPCT in the last 72 hours. Urine analysis:    Component Value Date/Time   COLORURINE YELLOW 06/02/2020 1319   APPEARANCEUR HAZY (A) 06/02/2020 1319   LABSPEC 1.011 06/02/2020 1319   PHURINE 9.0 (H) 06/02/2020 1319   GLUCOSEU NEGATIVE 06/02/2020 1319   HGBUR NEGATIVE 06/02/2020 1319   BILIRUBINUR NEGATIVE 06/02/2020 1319   BILIRUBINUR negative 06/08/2019 1746   KETONESUR NEGATIVE 06/02/2020 1319   PROTEINUR 100 (A) 06/02/2020 1319   UROBILINOGEN 0.2 06/08/2019 1746   NITRITE NEGATIVE 06/02/2020 1319   LEUKOCYTESUR LARGE (A) 06/02/2020 1319    Radiological Exams on Admission: CT Head Wo Contrast  Result Date: 06/02/2020 CLINICAL DATA:  Head injury after fall. EXAM: CT HEAD WITHOUT CONTRAST TECHNIQUE: Contiguous axial images were obtained from the base of the skull through the vertex without intravenous contrast.  COMPARISON:  February 13, 2020. FINDINGS: Brain: Moderate diffuse cortical atrophy is noted. Mild chronic ischemic white matter disease is noted. No mass effect or midline shift is noted. Ventricular size is within normal limits. There is no evidence of mass lesion, hemorrhage or acute infarction. Vascular: No hyperdense vessel or unexpected calcification. Skull: Normal. Negative for fracture or focal lesion. Sinuses/Orbits: No acute finding. Other: None. IMPRESSION: Moderate diffuse cortical atrophy. Mild chronic ischemic white matter disease. No acute intracranial abnormality seen. Electronically Signed   By: Marijo Conception M.D.   On: 06/02/2020 15:02   DG Chest Portable 1 View  Result Date: 06/02/2020 CLINICAL DATA:  Cough. EXAM: PORTABLE CHEST 1 VIEW COMPARISON:  February 13, 2020. FINDINGS: The heart size and mediastinal contours are within normal limits. No pneumothorax or pleural effusion is noted. Right lung is clear. Minimal left basilar subsegmental atelectasis is noted. The visualized skeletal structures are unremarkable. IMPRESSION: Minimal left basilar subsegmental atelectasis. Electronically Signed   By: Marijo Conception M.D.   On: 06/02/2020 14:14    EKG: Independently reviewed. Sinus vs atrial ectopic rhythm, LVH, IVCD. No STEMI  Assessment/Plan Active Problems:   Acute kidney injury superimposed on CKD 3b (HCC)   Acute lower UTI   Stage 3 chronic kidney disease (HCC)   Type 2 diabetes mellitus (HCC)   Anemia of chronic renal failure, stage 3 (moderate) (HCC)   Hypothyroidism   GERD (gastroesophageal reflux disease)   Urinary retention   Complicated UTI (urinary tract infection)    1. UTI - patient with positive U/A, leukocytosis. BP stable. Does not appear toxic. Source most lilely CIC. Plan Continue IV rocephin  Will convert to po abx if he does well.  Nurse education about sterile procedure for  CIC  2. AKI on CKD 3b - mild rise of Cr. Plan  IV hydration over  night  Bmet in AM  3. DM- Will continue home meds plus sliding scale  4. Anemia of chronic disease - at baseline  5. Urology - followed by Urology for dysfunctional bladder Plan Continue flomax  Continue CIC tid  6. HFpEF - Weakness - chronic, very poor exercise tolerance. Last Echo Dec '21 with EF 60-65 % - Grade 3 diastolic dysfxn. Plan Continue home cardiac meds  PT/OT eval  TOC  Recommended to son that patients primary cardiologist review his regimen as outpatient.    DVT prophylaxis: has IVC filter. SCDs  Code Status: DNR  Family Communication: spoke with Lucienne Capers son. Discussed issue of weakness and diastolic heart failure. He understands that admitting dx UTI and plan is for short stay. Patient will need outpatient cardiology follow up. Shanon Brow agrees with DNR. Disposition Plan: Home when stable  Consults called: none (with names) Admission status: obs    Adella Hare MD [facility] Pager [phone]  If 7PM-7AM, please contact night-coverage [email or web address] Password [facility]  06/02/2020, 4:19 PM

## 2020-06-02 NOTE — Progress Notes (Signed)
Patient arrived to room 2w03 from ED.  Assessment complete, VS obtained, and Admission database began.

## 2020-06-02 NOTE — ED Notes (Signed)
Report called to 2w  

## 2020-06-02 NOTE — ED Triage Notes (Addendum)
Pt to ED via EMS from home. Pt called EMS out initially for assist because pt had fallen while on a stool  in the bathroom when attempting to self cath. No LOC ,  Not on blood thinners. Pt noted to have generalized weakness, pt reports symptoms started this morning. Orientation at baseline; A&OX4 No obvious  Injury noted by EMS . No medications given by EMS. Hx: HTN, Kindey disease , cardiac history( bypass 1994)Last VS: 180/70, hr 76, rr 20, cbg 159, 97%RA

## 2020-06-02 NOTE — ED Provider Notes (Signed)
[facility] EMERGENCY DEPARTMENT Provider Note   CSN: [id number] Arrival date & time: 06/02/20  1228     History Chief Complaint  Patient presents with  . Weakness  . Fall    Tommy [name] is a 87 y.o. male.  Patient presents from home via EMS after feeling generally weak this morning.  Patient had fallen while on the stool in the bathroom while attempting to self cath.  Patient's been self cathing for the past few weeks urinary retention started on tamsulosin.  Patient denies any fevers, concerning headaches, vomiting, blood in stools, unilateral symptoms or signs.  Patient did hit his head, recalls details.  No anticoagulant use.  Patient denies any other injuries.        Past Medical History:  Diagnosis Date  . Anemia   . Carotid artery occlusion    a. 2004 s/p L carotid endarterectomy; b. 02/2019 U/S: 1-39% bilat ICA stenosis. <50% bilat CCA stenosis; c. 02/2020 U/S: 50-69% RICA, s/p L CEA w/o hemodynamically signif narrowing.  . CKD (chronic kidney disease) stage 3, GFR 30-59 ml/min (HCC)   . Coronary artery disease    a. 1994 s/p CABG x 3, [place], [place] (LIMA->LAD, VG->dRCA, VG->unknown vessel); b. 02/2016 Cath: LM 99d, LAD 100ost, LCX 100ost, RCA 80ost, diffuse 85-59m. LIMA->LAD ok, VG->RCA ok. VG->unknown vessel 100.  . Diabetes mellitus without complication ([place])   . Diastolic dysfunction    a. 02/2017 Echo: EF 55-65%, no rwma. Nl DD. Ao sclerosis w/o stenosis. Mod TR. Mild MR; b. 02/2020 Echo: EF 60-65%, no rwma, Gr2 DD, nl RV size/fxn.   Marland Kitchen Dyslipidemia   . GERD (gastroesophageal reflux disease)   . Hemorrhoids   . 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 Hypertension   . Incontinence of feces 10/28/2019  . Pulmonary embolism ([place])   . Thyroid disease     Patient Active Problem List   Diagnosis Date Noted  . Pulmonary nodule 05/26/2020  . Urinary retention 05/26/2020  . Neck pain 05/26/2020  . Arteriovenous fistula of right femoral  vessels ([place]) 05/26/2020  . Preventative health care 04/03/2020  . Orthostatic lightheadedness 03/15/2020  . Heparin induced thrombocytopenia ([place]) 02/14/2020  . Lightheadedness 02/14/2020  . Acute kidney injury superimposed on CKD 3b ([place]) 02/13/2020  . Postural dizziness with presyncope 02/13/2020  . Elevated troponin 02/13/2020  . Varicose veins of both lower extremities 02/04/2020  . Cerumen impaction 01/24/2020  . Nocturia 01/13/2020  . Trifascicular block 10/20/2019  . PAD (peripheral artery disease) ([place]) 10/20/2019  . Fall 08/04/2019  . Generalized weakness 06/09/2019  . First degree AV block 12/24/2018  . Bilateral carotid artery stenosis 09/17/2017  . Anemia of chronic disease 09/17/2017  . Anemia of chronic renal failure, stage 3 (moderate) ([place]) 05/23/2017  . Aortic valve sclerosis 02/04/2017  . Carotid artery disease ([place]) 02/04/2017  . H/O endarterectomy 02/04/2017  . Hyperlipidemia LDL goal <70 02/04/2017  . Essential hypertension 02/04/2017  . GERD (gastroesophageal reflux disease) 12/10/2016  . Stage 3 chronic kidney disease ([place]) 12/09/2016  . Coronary artery disease of native artery of native heart with stable angina pectoris ([place]) 12/09/2016  . Type 2 diabetes mellitus ([place]) 12/09/2016  . Hypothyroidism 12/09/2016    Past Surgical History:  Procedure Laterality Date  . CAROTID ENDARTERECTOMY Left 2004  . CORONARY ARTERY BYPASS GRAFT  1994   [name]  . TONSILLECTOMY AND ADENOIDECTOMY         Family History  Problem Relation Age of Onset  .  Heart attack Mother   . Heart disease Mother   . Stroke Mother   . Diabetes Father   . Coronary artery disease Sister     Social History   Tobacco Use  . Smoking status: Former Smoker    Packs/day: 1.50    Years: 25.00    Pack years: 37.50    Types: Cigarettes    Quit date: 1978    Years since quitting: 44.2  . Smokeless tobacco: Never Used  Vaping Use  . Vaping Use: Never used  Substance  Use Topics  . Alcohol use: Yes    Alcohol/week: 1.0 standard drink    Types: 1 Glasses of wine per week    Comment: socially   . Drug use: No    Home Medications Prior to Admission medications   Medication Sig Start Date End Date Taking? Authorizing Provider  acetaminophen (TYLENOL) 325 MG tablet Take 2 tablets (650 mg total) by mouth every 4 (four) hours as needed for moderate pain. 05/19/20 05/19/21  Shaune Pollack, MD  aspirin 81 MG tablet Take 81 mg by mouth at bedtime.    [provider]  atorvastatin (LIPITOR) 40 MG tablet TAKE 1 TABLET BY MOUTH  DAILY 11/01/19   Doreene Nest, NP  B-D ULTRAFINE III SHORT PEN 31G X 8 MM MISC USE AS INSTRUCTED TO INJECT INSULIN DAILY. 03/24/20   Doreene Nest, NP  Bacillus Coagulans-Inulin (ALIGN PREBIOTIC-PROBIOTIC PO) Take 1 capsule by mouth daily at 12 noon.    [provider]  betamethasone valerate (VALISONE) 0.1 % cream APPLY TO AFFECTED AREA TWICE A DAY 03/30/20   Doreene Nest, NP  Blood Glucose Monitoring Suppl (ONETOUCH VERIO) w/Device KIT 1 Device by Does not apply route daily. Use as instructed to test blood sugar daily 03/31/20   Doreene Nest, NP  calcium carbonate (OSCAL) 1500 (600 Ca) MG TABS tablet Take 600 mg of elemental calcium by mouth at bedtime.    [provider]  cholecalciferol (VITAMIN D3) 25 MCG (1000 UT) tablet Take 1,000 Units by mouth at bedtime.    [provider]  Diclofenac Sodium 3 % GEL Apply topically.    [provider]  epoetin alfa (EPOGEN,PROCRIT) 01410 UNIT/ML injection Inject 20,000 Units into the skin as directed. Receives if hemoglobin <10    [provider]  glimepiride (AMARYL) 4 MG tablet TAKE 1 TABLET BY MOUTH  DAILY WITH BREAKFAST 01/04/20   Doreene Nest, NP  Hydrocortisone (PROCTOSOL HC RE) Place 1 Dose rectally as needed.    [provider]  Insulin Glargine (LANTUS) 100 UNIT/ML Solostar Pen Inject 6 Units into the skin  at bedtime.    [provider]  Insulin Pen Needle 29G X 12.7MM MISC BD Ultrafine Pen Needle Use as instructed to inject insulin daily 02/18/17   Doreene Nest, NP  IRON PO Take 2 tablets by mouth 2 (two) times daily.    [provider]  Lancets (ONETOUCH DELICA PLUS LANCET33G) MISC USE AS INSTRUCT TO TEST BLOOD SUGAR ONCE DAILY 07/05/19   Doreene Nest, NP  levothyroxine (SYNTHROID) 175 MCG tablet Take 175 mcg by mouth daily before breakfast.    [provider]  meclizine (ANTIVERT) 12.5 MG tablet Take 1 tablet (12.5 mg total) by mouth 3 (three) times daily as needed for dizziness. 02/10/20   Doreene Nest, NP  Multiple Vitamin (MULTIVITAMIN) capsule Take 1 capsule by mouth daily.    [provider]  nitroGLYCERIN (NITROSTAT)  0.4 MG SL tablet Place 0.4 mg under the tongue every 5 (five) minutes as needed for chest pain.    [provider]  Nutritional Supplements (JUICE PLUS FIBRE PO) Take 1 capsule by mouth daily.    [provider]  Omega-3 Fatty Acids (FISH OIL PO) Take 2 capsules by mouth 2 (two) times daily.    [provider]  [employer] VERIO test strip USE AS INSTRUCTED TO TEST BLOOD SUGAR ONCE DAILY 07/26/19   Pleas Koch, NP  pantoprazole (PROTONIX) 20 MG tablet TAKE 1 TABLET BY MOUTH  DAILY 11/12/19   Pleas Koch, NP  ranolazine (RANEXA) 1000 MG SR tablet Take 1 tablet (1,000 mg total) by mouth 2 (two) times daily. 02/28/20   End, Harrell Gave, MD  tamsulosin (FLOMAX) 0.4 MG CAPS capsule Take 1 capsule (0.4 mg total) by mouth daily after supper. 05/19/20 06/18/20  Duffy Bruce, MD  timolol (TIMOPTIC) 0.5 % ophthalmic solution Place 1 drop into both eyes daily. 03/17/17   [provider]    Allergies    Heparin, Patiromer, and Sodium zirconium cyclosilicate  Review of Systems   Review of Systems  Constitutional: Positive for fatigue. Negative for chills and fever.  HENT: Negative for  congestion.   Eyes: Negative for visual disturbance.  Respiratory: Negative for shortness of breath.   Cardiovascular: Negative for chest pain.  Gastrointestinal: Negative for abdominal pain and vomiting.  Genitourinary: Positive for difficulty urinating. Negative for dysuria and flank pain.  Musculoskeletal: Negative for back pain, neck pain and neck stiffness.  Skin: Negative for rash.  Neurological: Positive for weakness. Negative for light-headedness and headaches.    Physical Exam Updated Vital Signs BP (!) 157/79   Pulse 67   Temp 97.9 F (36.6 C) (Oral)   Resp 13   Ht $R'5\' 10"'jg$  (1.778 m)   Wt 70.3 kg   SpO2 99%   BMI 22.24 kg/m   Physical Exam Vitals and nursing note reviewed.  Constitutional:      Appearance: He is well-developed.  HENT:     Head: Normocephalic.     Comments: Superficial abrasion left upper forehead no significant hematoma.  No midline neck tenderness full range of motion.  No lumbar thoracic midline tenderness.  Patient has equal 5+ strength upper and lower extremities without significant pain to extremities.    Nose: No congestion.  Eyes:     General:        Right eye: No discharge.        Left eye: No discharge.     Conjunctiva/sclera: Conjunctivae normal.  Neck:     Trachea: No tracheal deviation.  Cardiovascular:     Rate and Rhythm: Normal rate and regular rhythm.  Pulmonary:     Effort: Pulmonary effort is normal.     Breath sounds: Normal breath sounds.  Abdominal:     General: There is no distension.     Palpations: Abdomen is soft.     Tenderness: There is no abdominal tenderness. There is no guarding.  Musculoskeletal:        General: No swelling.     Cervical back: Normal range of motion and neck supple.     Right lower leg: No edema.  Skin:    General: Skin is warm.     Findings: No rash.  Neurological:     General: No focal deficit present.     Mental Status: He is alert and oriented to person, place, and time.   Psychiatric:  Behavior: Behavior is not agitated or slowed.     ED Results / Procedures / Treatments   Labs (all labs ordered are listed, but only abnormal results are displayed) Labs Reviewed  COMPREHENSIVE METABOLIC PANEL - Abnormal; Notable for the following components:      Result Value   Sodium 131 (*)    Chloride 97 (*)    Glucose, Bld 161 (*)    BUN 45 (*)    Creatinine, Ser 1.92 (*)    Albumin 2.5 (*)    AST 54 (*)    GFR, Estimated 32 (*)    All other components within normal limits  CBC WITH DIFFERENTIAL/PLATELET - Abnormal; Notable for the following components:   WBC 14.9 (*)    RBC 2.68 (*)    Hemoglobin 9.2 (*)    HCT 27.9 (*)    MCV 104.1 (*)    MCH 34.3 (*)    Neutro Abs 13.0 (*)    Abs Immature Granulocytes 0.20 (*)    All other components within normal limits  URINALYSIS, ROUTINE W REFLEX MICROSCOPIC - Abnormal; Notable for the following components:   APPearance HAZY (*)    pH 9.0 (*)    Protein, ur 100 (*)    Leukocytes,Ua LARGE (*)    Bacteria, UA MANY (*)    All other components within normal limits  TROPONIN I (HIGH SENSITIVITY) - Abnormal; Notable for the following components:   Troponin I (High Sensitivity) 28 (*)    All other components within normal limits  URINE CULTURE    EKG None  Radiology CT Head Wo Contrast  Result Date: 06/02/2020 CLINICAL DATA:  Head injury after fall. EXAM: CT HEAD WITHOUT CONTRAST TECHNIQUE: Contiguous axial images were obtained from the base of the skull through the vertex without intravenous contrast. COMPARISON:  February 13, 2020. FINDINGS: Brain: Moderate diffuse cortical atrophy is noted. Mild chronic ischemic white matter disease is noted. No mass effect or midline shift is noted. Ventricular size is within normal limits. There is no evidence of mass lesion, hemorrhage or acute infarction. Vascular: No hyperdense vessel or unexpected calcification. Skull: Normal. Negative for fracture or focal lesion.  Sinuses/Orbits: No acute finding. Other: None. IMPRESSION: Moderate diffuse cortical atrophy. Mild chronic ischemic white matter disease. No acute intracranial abnormality seen. Electronically Signed   By: Marijo Conception M.D.   On: 06/02/2020 15:02   DG Chest Portable 1 View  Result Date: 06/02/2020 CLINICAL DATA:  Cough. EXAM: PORTABLE CHEST 1 VIEW COMPARISON:  February 13, 2020. FINDINGS: The heart size and mediastinal contours are within normal limits. No pneumothorax or pleural effusion is noted. Right lung is clear. Minimal left basilar subsegmental atelectasis is noted. The visualized skeletal structures are unremarkable. IMPRESSION: Minimal left basilar subsegmental atelectasis. Electronically Signed   By: Marijo Conception M.D.   On: 06/02/2020 14:14    Procedures Procedures   Medications Ordered in ED Medications  cefTRIAXone (ROCEPHIN) 1 g in sodium chloride 0.9 % 100 mL IVPB (has no administration in time range)  sodium chloride 0.9 % bolus 500 mL (has no administration in time range)    ED Course  I have reviewed the triage vital signs and the nursing notes.  Pertinent labs & imaging results that were available during my care of the patient were reviewed by me and considered in my medical decision making (see chart for details).    MDM Rules/Calculators/A&P  Patient presents with general weakness discussed broad differential including anemia, electrolyte related, renal failure, urine infection, neurologic, medication, other.  Plan for general blood work, urine testing, chest x-ray with cough and general weakness and CT of the head with mild head injury and elderly patient on aspirin.  Nursing performed in and out catheter to help with urinary retention that is a known challenge for the patient.  Blood work reviewed showing sodium 131, white blood cell count 14.9 likely secondary to urine infection, hemoglobin 9.2 previously 11 without active bleeding,  urinalysis reviewed showing large amount of leukocytes and bacteria and patient does have symptoms.  Discussed with patient and hospitalist for admission, IV Rocephin ordered, general diet ordered.  Creatinine 1.9, previously 1.55 reviewed medical records.    Final Clinical Impression(s) / ED Diagnoses Final diagnoses:  General weakness  Acute head injury, initial encounter  Acute renal failure, unspecified acute renal failure type ([place])  Acute cystitis without hematuria    Rx / DC Orders ED Discharge Orders    None       Elnora Morrison, MD 06/02/20 [phone]

## 2020-06-02 NOTE — ED Notes (Signed)
Assuming care. Pt resting in bed @ this time w/ NAD noted. Mediations given VSS. Cardiac monitoring in place w/ bp cycling and spo2 being monnitored. Updated on plan of care. Deny needs or concerns @ this time. Bed low and locked.

## 2020-06-03 DIAGNOSIS — Z951 Presence of aortocoronary bypass graft: Secondary | ICD-10-CM | POA: Diagnosis not present

## 2020-06-03 DIAGNOSIS — Z66 Do not resuscitate: Secondary | ICD-10-CM | POA: Diagnosis present

## 2020-06-03 DIAGNOSIS — I2510 Atherosclerotic heart disease of native coronary artery without angina pectoris: Secondary | ICD-10-CM | POA: Diagnosis present

## 2020-06-03 DIAGNOSIS — Z20822 Contact with and (suspected) exposure to covid-19: Secondary | ICD-10-CM | POA: Diagnosis present

## 2020-06-03 DIAGNOSIS — N3000 Acute cystitis without hematuria: Secondary | ICD-10-CM | POA: Diagnosis present

## 2020-06-03 DIAGNOSIS — E871 Hypo-osmolality and hyponatremia: Secondary | ICD-10-CM | POA: Diagnosis present

## 2020-06-03 DIAGNOSIS — I130 Hypertensive heart and chronic kidney disease with heart failure and stage 1 through stage 4 chronic kidney disease, or unspecified chronic kidney disease: Secondary | ICD-10-CM | POA: Diagnosis present

## 2020-06-03 DIAGNOSIS — N390 Urinary tract infection, site not specified: Secondary | ICD-10-CM | POA: Diagnosis not present

## 2020-06-03 DIAGNOSIS — Z86711 Personal history of pulmonary embolism: Secondary | ICD-10-CM | POA: Diagnosis not present

## 2020-06-03 DIAGNOSIS — B964 Proteus (mirabilis) (morganii) as the cause of diseases classified elsewhere: Secondary | ICD-10-CM | POA: Diagnosis present

## 2020-06-03 DIAGNOSIS — E1151 Type 2 diabetes mellitus with diabetic peripheral angiopathy without gangrene: Secondary | ICD-10-CM | POA: Diagnosis present

## 2020-06-03 DIAGNOSIS — Z7982 Long term (current) use of aspirin: Secondary | ICD-10-CM | POA: Diagnosis not present

## 2020-06-03 DIAGNOSIS — W08XXXA Fall from other furniture, initial encounter: Secondary | ICD-10-CM | POA: Diagnosis present

## 2020-06-03 DIAGNOSIS — Z87891 Personal history of nicotine dependence: Secondary | ICD-10-CM | POA: Diagnosis not present

## 2020-06-03 DIAGNOSIS — N319 Neuromuscular dysfunction of bladder, unspecified: Secondary | ICD-10-CM | POA: Diagnosis present

## 2020-06-03 DIAGNOSIS — E1122 Type 2 diabetes mellitus with diabetic chronic kidney disease: Secondary | ICD-10-CM | POA: Diagnosis present

## 2020-06-03 DIAGNOSIS — K219 Gastro-esophageal reflux disease without esophagitis: Secondary | ICD-10-CM | POA: Diagnosis present

## 2020-06-03 DIAGNOSIS — N179 Acute kidney failure, unspecified: Secondary | ICD-10-CM | POA: Diagnosis present

## 2020-06-03 DIAGNOSIS — E785 Hyperlipidemia, unspecified: Secondary | ICD-10-CM | POA: Diagnosis present

## 2020-06-03 DIAGNOSIS — E119 Type 2 diabetes mellitus without complications: Secondary | ICD-10-CM | POA: Diagnosis not present

## 2020-06-03 DIAGNOSIS — D631 Anemia in chronic kidney disease: Secondary | ICD-10-CM | POA: Diagnosis present

## 2020-06-03 DIAGNOSIS — E039 Hypothyroidism, unspecified: Secondary | ICD-10-CM | POA: Diagnosis present

## 2020-06-03 DIAGNOSIS — R531 Weakness: Secondary | ICD-10-CM | POA: Diagnosis present

## 2020-06-03 DIAGNOSIS — R911 Solitary pulmonary nodule: Secondary | ICD-10-CM | POA: Diagnosis present

## 2020-06-03 DIAGNOSIS — Z833 Family history of diabetes mellitus: Secondary | ICD-10-CM | POA: Diagnosis not present

## 2020-06-03 DIAGNOSIS — N1832 Chronic kidney disease, stage 3b: Secondary | ICD-10-CM | POA: Diagnosis present

## 2020-06-03 DIAGNOSIS — I5032 Chronic diastolic (congestive) heart failure: Secondary | ICD-10-CM | POA: Diagnosis present

## 2020-06-03 DIAGNOSIS — Z8249 Family history of ischemic heart disease and other diseases of the circulatory system: Secondary | ICD-10-CM | POA: Diagnosis not present

## 2020-06-03 LAB — CBC
HCT: 26.5 % — ABNORMAL LOW (ref 39.0–52.0)
Hemoglobin: 9.2 g/dL — ABNORMAL LOW (ref 13.0–17.0)
MCH: 34.6 pg — ABNORMAL HIGH (ref 26.0–34.0)
MCHC: 34.7 g/dL (ref 30.0–36.0)
MCV: 99.6 fL (ref 80.0–100.0)
Platelets: 243 10*3/uL (ref 150–400)
RBC: 2.66 MIL/uL — ABNORMAL LOW (ref 4.22–5.81)
RDW: 12.2 % (ref 11.5–15.5)
WBC: 12.3 10*3/uL — ABNORMAL HIGH (ref 4.0–10.5)
nRBC: 0 % (ref 0.0–0.2)

## 2020-06-03 LAB — GLUCOSE, CAPILLARY
Glucose-Capillary: 147 mg/dL — ABNORMAL HIGH (ref 70–99)
Glucose-Capillary: 140 mg/dL — ABNORMAL HIGH (ref 70–99)
Glucose-Capillary: 192 mg/dL — ABNORMAL HIGH (ref 70–99)
Glucose-Capillary: 162 mg/dL — ABNORMAL HIGH (ref 70–99)

## 2020-06-03 LAB — BASIC METABOLIC PANEL
Anion gap: 9 (ref 5–15)
BUN: 41 mg/dL — ABNORMAL HIGH (ref 8–23)
CO2: 21 mmol/L — ABNORMAL LOW (ref 22–32)
Calcium: 8.7 mg/dL — ABNORMAL LOW (ref 8.9–10.3)
Chloride: 96 mmol/L — ABNORMAL LOW (ref 98–111)
Creatinine, Ser: 1.8 mg/dL — ABNORMAL HIGH (ref 0.61–1.24)
GFR, Estimated: 35 mL/min — ABNORMAL LOW (ref >60)
Glucose, Bld: 263 mg/dL — ABNORMAL HIGH (ref 70–99)
Potassium: 4.6 mmol/L (ref 3.5–5.1)
Sodium: 126 mmol/L — ABNORMAL LOW (ref 135–145)

## 2020-06-03 LAB — BRAIN NATRIURETIC PEPTIDE: B Natriuretic Peptide: 516.4 pg/mL — ABNORMAL HIGH (ref 0.0–100.0)

## 2020-06-03 MED ORDER — SODIUM CHLORIDE 0.45 % IV SOLN: INTRAVENOUS | Status: DC

## 2020-06-03 MED ORDER — HYDRALAZINE HCL 25 MG PO TABS: 25.0000 mg | ORAL_TABLET | Freq: Three times a day (TID) | ORAL | Status: DC | PRN

## 2020-06-03 MED ORDER — RANOLAZINE ER 500 MG PO TB12
500.0000 mg | ORAL_TABLET | Freq: Two times a day (BID) | ORAL | Status: DC
  Administered 2020-06-03 – 2020-06-05 (×4): 500 mg via ORAL
  Filled 2020-06-03 (×4): qty 1

## 2020-06-03 MED ORDER — SODIUM CHLORIDE 0.9 % IV SOLN
1.0000 g | INTRAVENOUS | Status: DC
  Administered 2020-06-03 – 2020-06-04 (×2): 1 g via INTRAVENOUS
  Filled 2020-06-03 (×2): qty 10
  Filled 2020-06-03: qty 1

## 2020-06-03 NOTE — Evaluation (Signed)
Physical Therapy Evaluation Patient Details Name: Praneeth Bussey MRN: [id number] DOB: 02/24/1927 Today's Date: 06/03/2020   History of Present Illness  87 y.o. 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 For a week prior to admission 06/02/20 he has been feeling weaker than usual. He c/o dysuria. He fell off the commode and came to ED via EMS.  Clinical Impression   Pt admitted secondary to problem above with deficits below. PTA patient was very active and driving to gym 3 days/week for workouts. Pt currently requires min-guard assist overall due to fatigue/decreased endurance and resulting decr balance. Will continue to follow acutely to maximize functional mobility independence and safety.       Follow Up Recommendations Home health PT (vs no PT;) Supervision for mobility/OOB     Equipment Recommendations  None recommended by PT    Recommendations for Other Services       Precautions / Restrictions Precautions Precautions: Fall Precaution Comments: prior to fall at admission; prior to that fell May 2021 (pushing 2 suitcases through airport and hit a curb cut and went down) Restrictions Weight Bearing Restrictions: No      Mobility  Bed Mobility Overal bed mobility: Modified Independent             General bed mobility comments: HOB flat, no rail; increased time to scoot out to EOB    Transfers Overall transfer level: Needs assistance Equipment used: None Transfers: Sit to/from Stand Sit to Stand: Min assist         General transfer comment: mild imbalance posteriorly upon standing  Ambulation/Gait Ambulation/Gait assistance: Min guard Gait Distance (Feet): 180 Feet Assistive device: None Gait Pattern/deviations: Decreased stride length;Drifts right/left   Gait velocity interpretation: <1.8 ft/sec, indicate of risk for recurrent falls General Gait Details: pt walking  very guarded, stiff-legged with mild drift to his left (able to maintain his balance, however minguard for safety)  Stairs            Wheelchair Mobility    Modified Rankin (Stroke Patients Only)       Balance Overall balance assessment: Needs assistance Sitting-balance support: No upper extremity supported;Feet supported Sitting balance-Leahy Scale: Good Sitting balance - Comments: reaching overhead without LOB   Standing balance support: No upper extremity supported;During functional activity Standing balance-Leahy Scale: Fair                               Pertinent Vitals/Pain Pain Assessment: No/denies pain    Home Living Family/patient expects to be discharged to:: Private residence Living Arrangements: Children;Other relatives Available Help at Discharge: Family;Available 24 hours/day Type of Home: House Home Access: Stairs to enter Entrance Stairs-Rails: Right Entrance Stairs-Number of Steps: 2 Home Layout: Two level;Bed/bath upstairs Home Equipment: Grab bars - tub/shower      Prior Function Level of Independence: Independent         Comments: still driving; goes to gym 3x/week (30 min on treadmill, 10 weight machines)     Hand Dominance   Dominant Hand: Right    Extremity/Trunk Assessment   Upper Extremity Assessment Upper Extremity Assessment: Defer to OT evaluation    Lower Extremity Assessment Lower Extremity Assessment: Overall WFL for tasks assessed    Cervical / Trunk Assessment Cervical / Trunk Assessment: Normal  Communication   Communication: No difficulties  Cognition Arousal/Alertness: Awake/alert Behavior During Therapy: [facility] for  tasks assessed/performed Overall Cognitive Status: Within Functional Limits for tasks assessed                                        General Comments General comments (skin integrity, edema, etc.): Dove-tail session with OT.    Exercises     Assessment/Plan     PT Assessment Patient needs continued PT services  PT Problem List Decreased activity tolerance;Decreased balance;Decreased mobility;Decreased knowledge of use of DME       PT Treatment Interventions DME instruction;Gait training;Stair training;Functional mobility training;Therapeutic activities;Therapeutic exercise;Balance training;Patient/family education    PT Goals (Current goals can be found in the Care Plan section)  Acute Rehab PT Goals Patient Stated Goal: regain strength and resume active life PT Goal Formulation: With patient Time For Goal Achievement: 06/17/20 Potential to Achieve Goals: Good    Frequency Min 3X/week   Barriers to discharge        Co-evaluation               AM-PAC PT "6 Clicks" Mobility  Outcome Measure Help needed turning from your back to your side while in a flat bed without using bedrails?: None Help needed moving from lying on your back to sitting on the side of a flat bed without using bedrails?: None Help needed moving to and from a bed to a chair (including a wheelchair)?: A Little Help needed standing up from a chair using your arms (e.g., wheelchair or bedside chair)?: A Little Help needed to walk in hospital room?: A Little Help needed climbing 3-5 steps with a railing? : A Little 6 Click Score: 20    End of Session Equipment Utilized During Treatment: Gait belt Activity Tolerance: Patient tolerated treatment well Patient left: Other (comment) (at sink with OT)   PT Visit Diagnosis: Unsteadiness on feet (R26.81)    Time: 1205-1220 PT Time Calculation (min) (ACUTE ONLY): 15 min   Charges:   PT Evaluation $PT Eval Low Complexity: 1 Low           Arby Barrette, PT Pager [phone]   Rexanne Mano 06/03/2020, 12:43 PM

## 2020-06-03 NOTE — Progress Notes (Signed)
PROGRESS NOTE  Tommy [name] UUV:[id number] DOB: [birth year] DOA: 06/02/2020 PCP: Pleas Koch, NP  HPI/Recap of past 24 hours: HPI from Dr Toy Cookey Gaetan Spieker is a 87 y.o. male with medical history significant of CAD s/p CABG, DM2, HFpEF, GERD, HTN, dysfunctional bladder on self cath. For a week he has been feeling weaker than usual. He c/o dysuria. He was seen for urology f/u 06/01/20 and was thought to be stable but still requiring CIC. Pt was doing self-cath and was to weak to complete the procedure and fell off the commode. EMS activated and he is transported to [facility] for evaluation. In the ED, T 97.9 157/79  HR 67  RR 13, EDMD exam notable for frehead abrasion left w/o hematoma. Exam otherwise nl.  CT head w/o acute abnl. Cmet with glucose 161 Cr 1.92 ( baseline 1.40-1.50) Hgb 9.2 - chronic, WBC 14.9. U/A large LE, many bacteria, 21-50 wbc/hpf. CXR with left basilar atelectasis. Patient started on ceftriaxone. TRH called to admit for continued treatment.     Today, patient reports dysuria is improving, denies any chest pain, shortness of breath, abdominal pain, nausea/vomiting, fever/chills.    Assessment/Plan: Active Problems:   Stage 3 chronic kidney disease (HCC)   Type 2 diabetes mellitus (HCC)   Hypothyroidism   GERD (gastroesophageal reflux disease)   Anemia of chronic renal failure, stage 3 (moderate) (HCC)   Acute kidney injury superimposed on CKD 3b (HCC)   Urinary retention   Acute lower UTI   Complicated UTI (urinary tract infection)   UTI Currently afebrile, with leukocytosis UA positive for infection UC growing >100, 000 Proteus mirabilis, awaiting sensitivity Continue ceftriaxone  Dysfunctional bladder Continues education for sterile procedure for self-care Continue [facility]  CKD stage IIIb Creatinine at baseline S/p IV fluid Daily BMP  Chronic diastolic HF BNP 474.2, elevated from previous Chest x-ray showed minimal left  basilar atelectasis, no pulmonary congestion Last echo done on 12/21 showed EF of 60 to 59%, grade 3 diastolic dysfunction Telemetry Outpatient follow-up with cardiology  Hyponatremia Unsure etiology Daily BMP  Hypertension Not on any meds at home Hydralazine as needed Monitor closely  Diabetes mellitus type 2 A1c 7.1 SSI, lantus, Accu-Cheks, hypoglycemic protocol  Anemia of chronic kidney disease Hemoglobin at baseline Continue oral iron supplementation Daily CBC  CAD/Hyperlipidemia Continue statins, ranolazine  Hypothyroidism Continue Synthroid     Estimated body mass index is 22.24 kg/m as calculated from the following:   Height as of this encounter: 5\' 10"  (1.778 m).   Weight as of this encounter: 70.3 kg.     Code Status: DNR  Family Communication: None at bedside  Disposition Plan: Status is: Inpatient  The patient will require care spanning > 2 midnights and should be moved to inpatient because: Inpatient level of care appropriate due to severity of illness  Dispo: The patient is from: Home              Anticipated d/c is to: Home              Patient currently is not medically stable to d/c.   Difficult to place patient No    Consultants:  None  Procedures:  None  Antimicrobials:  Ceftriaxone  DVT prophylaxis: Allergic to heparin, SCDs for now   Objective: Vitals:   06/02/20 2143 06/02/20 2259 06/03/20 0600 06/03/20 0800  BP: (!) 202/82 (!) 189/53 (!) 190/67 (!) 117/49  Pulse: 75 78 80 76  Resp: 17 18 17  18  Temp: 98.1 F (36.7 C) 98.2 F (36.8 C) 98 F (36.7 C) 98.6 F (37 C)  TempSrc: Oral  Oral Oral  SpO2: 97% 97% 98% 96%  Weight:      Height:        Intake/Output Summary (Last 24 hours) at 06/03/2020 1342 Last data filed at 06/03/2020 0600 Gross per 24 hour  Intake 462.02 ml  Output 1000 ml  Net -537.98 ml   Filed Weights   06/02/20 1237  Weight: 70.3 kg    Exam:  General: NAD, alert, oriented  x3  Cardiovascular: S1, S2 present  Respiratory: CTAB  Abdomen: Soft, nontender, nondistended, bowel sounds present  Musculoskeletal: No bilateral pedal edema noted  Skin: Normal  Psychiatry: Normal mood   Data Reviewed: CBC: Recent Labs  Lab 06/02/20 1248 06/03/20 0102  WBC 14.9* 12.3*  NEUTROABS 13.0*  --   HGB 9.2* 9.2*  HCT 27.9* 26.5*  MCV 104.1* 99.6  PLT 241 578   Basic Metabolic Panel: Recent Labs  Lab 06/02/20 1248 06/03/20 0102  NA 131* 126*  K 4.8 4.6  CL 97* 96*  CO2 24 21*  GLUCOSE 161* 263*  BUN 45* 41*  CREATININE 1.92* 1.80*  CALCIUM 9.2 8.7*   GFR: Estimated Creatinine Clearance: 26.6 mL/min (A) (by C-G formula based on SCr of 1.8 mg/dL (H)). Liver Function Tests: Recent Labs  Lab 06/02/20 1248  AST 54*  ALT 42  ALKPHOS 97  BILITOT 0.6  PROT 7.2  ALBUMIN 2.5*   No results for input(s): LIPASE, AMYLASE in the last 168 hours. No results for input(s): AMMONIA in the last 168 hours. Coagulation Profile: No results for input(s): INR, PROTIME in the last 168 hours. Cardiac Enzymes: No results for input(s): CKTOTAL, CKMB, CKMBINDEX, TROPONINI in the last 168 hours. BNP (last 3 results) No results for input(s): PROBNP in the last 8760 hours. HbA1C: Recent Labs    06/02/20 1248  HGBA1C 7.1*   CBG: Recent Labs  Lab 06/02/20 1643 06/02/20 2140 06/03/20 0809 06/03/20 1202  GLUCAP 155* 209* 147* 140*   Lipid Profile: No results for input(s): CHOL, HDL, LDLCALC, TRIG, CHOLHDL, LDLDIRECT in the last 72 hours. Thyroid Function Tests: No results for input(s): TSH, T4TOTAL, FREET4, T3FREE, THYROIDAB in the last 72 hours. Anemia Panel: No results for input(s): VITAMINB12, FOLATE, FERRITIN, TIBC, IRON, RETICCTPCT in the last 72 hours. Urine analysis:    Component Value Date/Time   COLORURINE YELLOW 06/02/2020 1319   APPEARANCEUR HAZY (A) 06/02/2020 1319   LABSPEC 1.011 06/02/2020 1319   PHURINE 9.0 (H) 06/02/2020 1319   GLUCOSEU  NEGATIVE 06/02/2020 1319   HGBUR NEGATIVE 06/02/2020 1319   BILIRUBINUR NEGATIVE 06/02/2020 1319   BILIRUBINUR negative 06/08/2019 1746   KETONESUR NEGATIVE 06/02/2020 1319   PROTEINUR 100 (A) 06/02/2020 1319   UROBILINOGEN 0.2 06/08/2019 1746   NITRITE NEGATIVE 06/02/2020 1319   LEUKOCYTESUR LARGE (A) 06/02/2020 1319   Sepsis Labs: @LABRCNTIP (procalcitonin:4,lacticidven:4)  ) Recent Results (from the past 240 hour(s))  Urine culture     Status: Abnormal (Preliminary result)   Collection Time: 06/02/20  1:19 PM   Specimen: Urine, Random  Result Value Ref Range Status   Specimen Description URINE, RANDOM  Final   Special Requests NONE  Final   Culture (A)  Final    >=100,000 COLONIES/mL PROTEUS MIRABILIS SUSCEPTIBILITIES TO FOLLOW Performed at [facility], [place] [address] [place], [place] [address]    Report Status PENDING  Incomplete  Resp Panel by  RT-PCR (Flu A&B, Covid) Nasopharyngeal Swab     Status: None   Collection Time: 06/02/20  4:41 PM   Specimen: Nasopharyngeal Swab; Nasopharyngeal(NP) swabs in vial transport medium  Result Value Ref Range Status   SARS Coronavirus 2 by RT PCR NEGATIVE NEGATIVE Final    Comment: (NOTE) SARS-CoV-2 target nucleic acids are NOT DETECTED.  The SARS-CoV-2 RNA is generally detectable in upper respiratory specimens during the acute phase of infection. The lowest concentration of SARS-CoV-2 viral copies this assay can detect is 138 copies/mL. A negative result does not preclude SARS-Cov-2 infection and should not be used as the sole basis for treatment or other patient management decisions. A negative result may occur with  improper specimen collection/handling, submission of specimen other than nasopharyngeal swab, presence of viral mutation(s) within the areas targeted by this assay, and inadequate number of viral copies(<138 copies/mL). A negative result must be combined with clinical observations, patient history, and  epidemiological information. The expected result is Negative.  Fact Sheet for Patients:  EntrepreneurPulse.com.au  Fact Sheet for Healthcare Providers:  IncredibleEmployment.be  This test is no t yet approved or cleared by the [place] FDA and  has been authorized for detection and/or diagnosis of SARS-CoV-2 by FDA under an Emergency Use Authorization (EUA). This EUA will remain  in effect (meaning this test can be used) for the duration of the COVID-19 declaration under Section 564(b)(1) of the Act, 21 U.S.C.section 360bbb-3(b)(1), unless the authorization is terminated  or revoked sooner.       Influenza A by PCR NEGATIVE NEGATIVE Final   Influenza B by PCR NEGATIVE NEGATIVE Final    Comment: (NOTE) The Xpert Xpress SARS-CoV-2/FLU/RSV plus assay is intended as an aid in the diagnosis of influenza from Nasopharyngeal swab specimens and should not be used as a sole basis for treatment. Nasal washings and aspirates are unacceptable for Xpert Xpress SARS-CoV-2/FLU/RSV testing.  Fact Sheet for Patients: EntrepreneurPulse.com.au  Fact Sheet for Healthcare Providers: IncredibleEmployment.be  This test is not yet approved or cleared by the [place] FDA and has been authorized for detection and/or diagnosis of SARS-CoV-2 by FDA under an Emergency Use Authorization (EUA). This EUA will remain in effect (meaning this test can be used) for the duration of the COVID-19 declaration under Section 564(b)(1) of the Act, 21 U.S.C. section 360bbb-3(b)(1), unless the authorization is terminated or revoked.  Performed at [facility], [place] [address] [place], [place] [address]       Studies: CT Head Wo Contrast  Result Date: 06/02/2020 CLINICAL DATA:  Head injury after fall. EXAM: CT HEAD WITHOUT CONTRAST TECHNIQUE: Contiguous axial images were obtained from the base of the skull through the vertex  without intravenous contrast. COMPARISON:  February 13, 2020. FINDINGS: Brain: Moderate diffuse cortical atrophy is noted. Mild chronic ischemic white matter disease is noted. No mass effect or midline shift is noted. Ventricular size is within normal limits. There is no evidence of mass lesion, hemorrhage or acute infarction. Vascular: No hyperdense vessel or unexpected calcification. Skull: Normal. Negative for fracture or focal lesion. Sinuses/Orbits: No acute finding. Other: None. IMPRESSION: Moderate diffuse cortical atrophy. Mild chronic ischemic white matter disease. No acute intracranial abnormality seen. Electronically Signed   By: Marijo Conception M.D.   On: 06/02/2020 15:02   DG Chest Portable 1 View  Result Date: 06/02/2020 CLINICAL DATA:  Cough. EXAM: PORTABLE CHEST 1 VIEW COMPARISON:  February 13, 2020. FINDINGS: The heart size and mediastinal contours are within normal  limits. No pneumothorax or pleural effusion is noted. Right lung is clear. Minimal left basilar subsegmental atelectasis is noted. The visualized skeletal structures are unremarkable. IMPRESSION: Minimal left basilar subsegmental atelectasis. Electronically Signed   By: Marijo Conception M.D.   On: 06/02/2020 14:14    Scheduled Meds: . aspirin EC  81 mg Oral QHS  . atorvastatin  40 mg Oral QHS  . ferrous sulfate  650 mg Oral Q breakfast   And  . ferrous sulfate  325 mg Oral QPM  . insulin aspart  0-15 Units Subcutaneous TID WC  . insulin glargine  6 Units Subcutaneous QHS  . levothyroxine  175 mcg Oral QAC breakfast  . pantoprazole  20 mg Oral Daily  . psyllium  1 packet Oral Daily  . ranolazine  1,000 mg Oral BID  . senna  1 tablet Oral BID  . tamsulosin  0.4 mg Oral QPC supper    Continuous Infusions: . sodium chloride 10 mL/hr at 06/03/20 0844  . cefTRIAXone (ROCEPHIN)  IV       LOS: 0 days     Alma Friendly, MD [facility]  If 7PM-7AM, please contact  night-coverage [email or web address] 06/03/2020, 1:42 PM
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
## 2020-06-04 DIAGNOSIS — K219 Gastro-esophageal reflux disease without esophagitis: Secondary | ICD-10-CM | POA: Diagnosis not present

## 2020-06-04 DIAGNOSIS — N1832 Chronic kidney disease, stage 3b: Secondary | ICD-10-CM | POA: Diagnosis not present

## 2020-06-04 DIAGNOSIS — N390 Urinary tract infection, site not specified: Secondary | ICD-10-CM | POA: Diagnosis not present

## 2020-06-04 DIAGNOSIS — E119 Type 2 diabetes mellitus without complications: Secondary | ICD-10-CM | POA: Diagnosis not present

## 2020-06-04 LAB — CBC WITH DIFFERENTIAL/PLATELET
Abs Immature Granulocytes: 0.16 10*3/uL — ABNORMAL HIGH (ref 0.00–0.07)
Basophils Absolute: 0 10*3/uL (ref 0.0–0.1)
Basophils Relative: 0 %
Eosinophils Absolute: 0.1 10*3/uL (ref 0.0–0.5)
Eosinophils Relative: 1 %
HCT: 25.7 % — ABNORMAL LOW (ref 39.0–52.0)
Hemoglobin: 8.9 g/dL — ABNORMAL LOW (ref 13.0–17.0)
Immature Granulocytes: 1 %
Lymphocytes Relative: 7 %
Lymphs Abs: 0.9 10*3/uL (ref 0.7–4.0)
MCH: 35.2 pg — ABNORMAL HIGH (ref 26.0–34.0)
MCHC: 34.6 g/dL (ref 30.0–36.0)
MCV: 101.6 fL — ABNORMAL HIGH (ref 80.0–100.0)
Monocytes Absolute: 0.9 10*3/uL (ref 0.1–1.0)
Monocytes Relative: 7 %
Neutro Abs: 10.8 10*3/uL — ABNORMAL HIGH (ref 1.7–7.7)
Neutrophils Relative %: 84 %
Platelets: 244 10*3/uL (ref 150–400)
RBC: 2.53 MIL/uL — ABNORMAL LOW (ref 4.22–5.81)
RDW: 12.3 % (ref 11.5–15.5)
WBC: 13 10*3/uL — ABNORMAL HIGH (ref 4.0–10.5)
nRBC: 0 % (ref 0.0–0.2)

## 2020-06-04 LAB — URINE CULTURE: Culture: >=100000 — AB

## 2020-06-04 LAB — GLUCOSE, CAPILLARY
Glucose-Capillary: 157 mg/dL — ABNORMAL HIGH (ref 70–99)
Glucose-Capillary: 162 mg/dL — ABNORMAL HIGH (ref 70–99)
Glucose-Capillary: 144 mg/dL — ABNORMAL HIGH (ref 70–99)
Glucose-Capillary: 201 mg/dL — ABNORMAL HIGH (ref 70–99)

## 2020-06-04 LAB — BASIC METABOLIC PANEL
Anion gap: 7 (ref 5–15)
BUN: 35 mg/dL — ABNORMAL HIGH (ref 8–23)
CO2: 25 mmol/L (ref 22–32)
Calcium: 8.7 mg/dL — ABNORMAL LOW (ref 8.9–10.3)
Chloride: 99 mmol/L (ref 98–111)
Creatinine, Ser: 1.64 mg/dL — ABNORMAL HIGH (ref 0.61–1.24)
GFR, Estimated: 39 mL/min — ABNORMAL LOW (ref >60)
Glucose, Bld: 217 mg/dL — ABNORMAL HIGH (ref 70–99)
Potassium: 4.3 mmol/L (ref 3.5–5.1)
Sodium: 131 mmol/L — ABNORMAL LOW (ref 135–145)

## 2020-06-04 NOTE — Plan of Care (Signed)
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
## 2020-06-04 NOTE — Progress Notes (Signed)
PROGRESS NOTE  Tommy [name] EUM:[id number] DOB: 09-16-1926 DOA: 06/02/2020 PCP: Pleas Koch, NP  HPI/Recap of past 24 hours: HPI from Tommy [name] is a 87 y.o. male with medical history significant of CAD s/p CABG, DM2, HFpEF, GERD, HTN, dysfunctional bladder on self cath. For a week he has been feeling weaker than usual. He c/o dysuria. He was seen for urology f/u 06/01/20 and was thought to be stable but still requiring CIC. Pt was doing self-cath and was to weak to complete the procedure and fell off the commode. EMS activated and he is transported to [facility] for evaluation. In the ED, T 97.9 157/79  HR 67  RR 13, EDMD exam notable for frehead abrasion left w/o hematoma. Exam otherwise nl.  CT head w/o acute abnl. Cmet with glucose 161 Cr 1.92 ( baseline 1.40-1.50) Hgb 9.2 - chronic, WBC 14.9. U/A large LE, many bacteria, 21-50 wbc/hpf. CXR with left basilar atelectasis. Patient started on ceftriaxone. TRH called to admit for continued treatment.     Patient denies any new complaints.    Assessment/Plan: Active Problems:   Stage 3 chronic kidney disease (HCC)   Type 2 diabetes mellitus (HCC)   Hypothyroidism   GERD (gastroesophageal reflux disease)   Anemia of chronic renal failure, stage 3 (moderate) (HCC)   Acute kidney injury superimposed on CKD 3b (HCC)   Urinary retention   Acute lower UTI   Complicated UTI (urinary tract infection)   UTI (urinary tract infection)   UTI Currently afebrile, still with leukocytosis UA positive for infection UC growing >100, 000 Proteus mirabilis Continue ceftriaxone  Dysfunctional bladder Continues education for sterile procedure for self-care Continue [place] Outpatient urology  CKD stage IIIb Creatinine at baseline S/p IV fluid Daily BMP  Chronic diastolic HF BNP 540.0, elevated from previous Chest x-ray showed minimal left basilar atelectasis, no pulmonary congestion Last echo done on 12/21 showed EF of  60 to 86%, grade 3 diastolic dysfunction Telemetry Outpatient follow-up with cardiology  Hyponatremia Unsure etiology Daily BMP  Hypertension Not on any meds at home Hydralazine as needed Monitor closely  Diabetes mellitus type 2 A1c 7.1 SSI, lantus, Accu-Cheks, hypoglycemic protocol  Anemia of chronic kidney disease Hemoglobin at baseline Continue oral iron supplementation Daily CBC  CAD/Hyperlipidemia Continue statins, ranolazine  Hypothyroidism Continue Synthroid     Estimated body mass index is 22.24 kg/m as calculated from the following:   Height as of this encounter: 5\' 10"  (1.778 m).   Weight as of this encounter: 70.3 kg.     Code Status: DNR  Family Communication: None at bedside  Disposition Plan: Status is: Inpatient  The patient will require care spanning > 2 midnights and should be moved to inpatient because: Inpatient level of care appropriate due to severity of illness  Dispo: The patient is from: Home              Anticipated d/c is to: Home              Patient currently is not medically stable to d/c.   Difficult to place patient No    Consultants:  None  Procedures:  None  Antimicrobials:  Ceftriaxone  DVT prophylaxis: Allergic to heparin, SCDs for now   Objective: Vitals:   06/03/20 1606 06/03/20 2101 06/04/20 0553 06/04/20 1620  BP: (!) 141/55 (!) 143/54 (!) 138/52 (!) 125/52  Pulse: 72 76 75 66  Resp: 15 14 14 16   Temp: 97.6 F (36.4 C)  98.5 F (36.9 C) 98.1 F (36.7 C) 98.7 F (37.1 C)  TempSrc:  Oral Oral Oral  SpO2: 96% 98% 96% 99%  Weight:      Height:        Intake/Output Summary (Last 24 hours) at 06/04/2020 1632 Last data filed at 06/04/2020 1226 Gross per 24 hour  Intake 240 ml  Output 1500 ml  Net -1260 ml   Filed Weights   06/02/20 1237  Weight: 70.3 kg    Exam:  General: NAD, alert, oriented x3  Cardiovascular: S1, S2 present  Respiratory: CTAB  Abdomen: Soft, nontender, nondistended,  bowel sounds present  Musculoskeletal: No bilateral pedal edema noted  Skin: Normal  Psychiatry: Normal mood   Data Reviewed: CBC: Recent Labs  Lab 06/02/20 1248 06/03/20 0102 06/04/20 0027  WBC 14.9* 12.3* 13.0*  NEUTROABS 13.0*  --  10.8*  HGB 9.2* 9.2* 8.9*  HCT 27.9* 26.5* 25.7*  MCV 104.1* 99.6 101.6*  PLT 241 243 174   Basic Metabolic Panel: Recent Labs  Lab 06/02/20 1248 06/03/20 0102 06/04/20 0027  NA 131* 126* 131*  K 4.8 4.6 4.3  CL 97* 96* 99  CO2 24 21* 25  GLUCOSE 161* 263* 217*  BUN 45* 41* 35*  CREATININE 1.92* 1.80* 1.64*  CALCIUM 9.2 8.7* 8.7*   GFR: Estimated Creatinine Clearance: 29.2 mL/min (A) (by C-G formula based on SCr of 1.64 mg/dL (H)). Liver Function Tests: Recent Labs  Lab 06/02/20 1248  AST 54*  ALT 42  ALKPHOS 97  BILITOT 0.6  PROT 7.2  ALBUMIN 2.5*   No results for input(s): LIPASE, AMYLASE in the last 168 hours. No results for input(s): AMMONIA in the last 168 hours. Coagulation Profile: No results for input(s): INR, PROTIME in the last 168 hours. Cardiac Enzymes: No results for input(s): CKTOTAL, CKMB, CKMBINDEX, TROPONINI in the last 168 hours. BNP (last 3 results) No results for input(s): PROBNP in the last 8760 hours. HbA1C: Recent Labs    06/02/20 1248  HGBA1C 7.1*   CBG: Recent Labs  Lab 06/03/20 1604 06/03/20 2102 06/04/20 0744 06/04/20 1209 06/04/20 1609  GLUCAP 192* 162* 157* 162* 144*   Lipid Profile: No results for input(s): CHOL, HDL, LDLCALC, TRIG, CHOLHDL, LDLDIRECT in the last 72 hours. Thyroid Function Tests: No results for input(s): TSH, T4TOTAL, FREET4, T3FREE, THYROIDAB in the last 72 hours. Anemia Panel: No results for input(s): VITAMINB12, FOLATE, FERRITIN, TIBC, IRON, RETICCTPCT in the last 72 hours. Urine analysis:    Component Value Date/Time   COLORURINE YELLOW 06/02/2020 1319   APPEARANCEUR HAZY (A) 06/02/2020 1319   LABSPEC 1.011 06/02/2020 1319   PHURINE 9.0 (H)  06/02/2020 1319   GLUCOSEU NEGATIVE 06/02/2020 1319   HGBUR NEGATIVE 06/02/2020 1319   BILIRUBINUR NEGATIVE 06/02/2020 1319   BILIRUBINUR negative 06/08/2019 1746   KETONESUR NEGATIVE 06/02/2020 1319   PROTEINUR 100 (A) 06/02/2020 1319   UROBILINOGEN 0.2 06/08/2019 1746   NITRITE NEGATIVE 06/02/2020 1319   LEUKOCYTESUR LARGE (A) 06/02/2020 1319   Sepsis Labs: @LABRCNTIP (procalcitonin:4,lacticidven:4)  ) Recent Results (from the past 240 hour(s))  Urine culture     Status: Abnormal   Collection Time: 06/02/20  1:19 PM   Specimen: Urine, Random  Result Value Ref Range Status   Specimen Description URINE, RANDOM  Final   Special Requests   Final    NONE Performed at [facility], [place] [address], [place], [place] [address]    Culture >=100,000 COLONIES/mL PROTEUS MIRABILIS (A)  Final  Report Status 06/04/2020 FINAL  Final   Organism ID, Bacteria PROTEUS MIRABILIS (A)  Final      Susceptibility   Proteus mirabilis - MIC*    AMPICILLIN <=2 SENSITIVE Sensitive     CEFAZOLIN <=4 SENSITIVE Sensitive     CEFEPIME <=0.12 SENSITIVE Sensitive     CEFTRIAXONE <=0.25 SENSITIVE Sensitive     CIPROFLOXACIN <=0.25 SENSITIVE Sensitive     GENTAMICIN <=1 SENSITIVE Sensitive     IMIPENEM 2 SENSITIVE Sensitive     NITROFURANTOIN 128 RESISTANT Resistant     TRIMETH/SULFA <=20 SENSITIVE Sensitive     AMPICILLIN/SULBACTAM <=2 SENSITIVE Sensitive     PIP/TAZO <=4 SENSITIVE Sensitive     * >=100,000 COLONIES/mL PROTEUS MIRABILIS  Resp Panel by RT-PCR (Flu A&B, Covid) Nasopharyngeal Swab     Status: None   Collection Time: 06/02/20  4:41 PM   Specimen: Nasopharyngeal Swab; Nasopharyngeal(NP) swabs in vial transport medium  Result Value Ref Range Status   SARS Coronavirus 2 by RT PCR NEGATIVE NEGATIVE Final    Comment: (NOTE) SARS-CoV-2 target nucleic acids are NOT DETECTED.  The SARS-CoV-2 RNA is generally detectable in upper respiratory specimens during the acute phase of infection.  The lowest concentration of SARS-CoV-2 viral copies this assay can detect is 138 copies/mL. A negative result does not preclude SARS-Cov-2 infection and should not be used as the sole basis for treatment or other patient management decisions. A negative result may occur with  improper specimen collection/handling, submission of specimen other than nasopharyngeal swab, presence of viral mutation(s) within the areas targeted by this assay, and inadequate number of viral copies(<138 copies/mL). A negative result must be combined with clinical observations, patient history, and epidemiological information. The expected result is Negative.  Fact Sheet for Patients:  EntrepreneurPulse.com.au  Fact Sheet for Healthcare Providers:  IncredibleEmployment.be  This test is no t yet approved or cleared by the [place] FDA and  has been authorized for detection and/or diagnosis of SARS-CoV-2 by FDA under an Emergency Use Authorization (EUA). This EUA will remain  in effect (meaning this test can be used) for the duration of the COVID-19 declaration under Section 564(b)(1) of the Act, 21 U.S.C.section 360bbb-3(b)(1), unless the authorization is terminated  or revoked sooner.       Influenza A by PCR NEGATIVE NEGATIVE Final   Influenza B by PCR NEGATIVE NEGATIVE Final    Comment: (NOTE) The Xpert Xpress SARS-CoV-2/FLU/RSV plus assay is intended as an aid in the diagnosis of influenza from Nasopharyngeal swab specimens and should not be used as a sole basis for treatment. Nasal washings and aspirates are unacceptable for Xpert Xpress SARS-CoV-2/FLU/RSV testing.  Fact Sheet for Patients: EntrepreneurPulse.com.au  Fact Sheet for Healthcare Providers: IncredibleEmployment.be  This test is not yet approved or cleared by the [place] FDA and has been authorized for detection and/or diagnosis of SARS-CoV-2 by FDA  under an Emergency Use Authorization (EUA). This EUA will remain in effect (meaning this test can be used) for the duration of the COVID-19 declaration under Section 564(b)(1) of the Act, 21 U.S.C. section 360bbb-3(b)(1), unless the authorization is terminated or revoked.  Performed at [facility], [place] [address], [place], [place] [address]       Studies: No results found.  Scheduled Meds: . aspirin EC  81 mg Oral QHS  . atorvastatin  40 mg Oral QHS  . ferrous sulfate  650 mg Oral Q breakfast   And  . ferrous sulfate  325 mg Oral  QPM  . insulin aspart  0-15 Units Subcutaneous TID WC  . insulin glargine  6 Units Subcutaneous QHS  . levothyroxine  175 mcg Oral QAC breakfast  . pantoprazole  20 mg Oral Daily  . psyllium  1 packet Oral Daily  . ranolazine  500 mg Oral BID  . senna  1 tablet Oral BID  . tamsulosin  0.4 mg Oral QPC supper    Continuous Infusions: . sodium chloride 10 mL/hr at 06/03/20 0844  . cefTRIAXone (ROCEPHIN)  IV 1 g (06/03/20 1618)     LOS: 1 day     Alma Friendly, MD [facility]  If 7PM-7AM, please contact night-coverage [email or web address] 06/04/2020, 4:32 PM

## 2020-06-04 NOTE — Progress Notes (Signed)
Physical Therapy Treatment Patient Details Name: Tommy [name] MRN: [id number] DOB: May 08, 1926 Today's Date: 06/04/2020    History of Present Illness 87 y.o. 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 For a week prior to admission 06/02/20 he has been feeling weaker than usual. He c/o dysuria. He fell off the commode and came to ED via EMS.    PT Comments    Patient more steady on his feet today, however continues to require minguard assist due to impaired gait. Gait improves when pt increases his gait velocity, however he does not maintain the faster velocity when talking or performing tasks while walking. He is typically very independent and can continue to benefit from PT to address balance and gait.     Follow Up Recommendations  Home health PT;Supervision for mobility/OOB     Equipment Recommendations  None recommended by PT    Recommendations for Other Services       Precautions / Restrictions Precautions Precautions: Fall Precaution Comments: prior to fall at admission; prior to that fell May 2021 (pushing 2 suitcases through airport and hit a curb cut and went down)    Mobility  Bed Mobility Overal bed mobility: Modified Independent             General bed mobility comments: up in recliner    Transfers Overall transfer level: Needs assistance Equipment used: None Transfers: Sit to/from Stand Sit to Stand: Min guard Stand pivot transfers: Min guard       General transfer comment: min guard for safety; slight posterior bias  Ambulation/Gait Ambulation/Gait assistance: Min guard   Assistive device: None Gait Pattern/deviations: Decreased stride length;Wide base of support Gait velocity: improves with cues   General Gait Details: initially walking very guarded, stiff-legged--fluidity improved when asked to walk faster and no drift. Slows down when talking or when  asked to look left and right   Stairs             Wheelchair Mobility    Modified Rankin (Stroke Patients Only)       Balance Overall balance assessment: Needs assistance Sitting-balance support: No upper extremity supported;Feet supported Sitting balance-Leahy Scale: Good Sitting balance - Comments: reaching overhead without LOB   Standing balance support: No upper extremity supported;During functional activity Standing balance-Leahy Scale: Fair   Single Leg Stance - Right Leg: 2 Single Leg Stance - Left Leg: 1         High level balance activites: Direction changes;Turns;Sudden stops;Head turns              Cognition Arousal/Alertness: Awake/alert Behavior During Therapy: WFL for tasks assessed/performed Overall Cognitive Status: Within Functional Limits for tasks assessed                                        Exercises Other Exercises Other Exercises: SLS with bil UE support x 30 seconds each leg. Pt tends to use far more UE support than he needs. He was able to feel his glut medius working when he did SLS without UE assist (closely guarded and by counter)    General Comments General comments (skin integrity, edema, etc.): Pt ambulated ~115' with min guard assist      Pertinent Vitals/Pain Pain Assessment: No/denies pain    Home Living  Prior Function            PT Goals (current goals can now be found in the care plan section) Acute Rehab PT Goals Patient Stated Goal: to go home and get back to the gym PT Goal Formulation: With patient Time For Goal Achievement: 06/17/20 Potential to Achieve Goals: Good Progress towards PT goals: Progressing toward goals    Frequency    Min 3X/week      PT Plan Current plan remains appropriate    Co-evaluation              AM-PAC PT "6 Clicks" Mobility   Outcome Measure  Help needed turning from your back to your side while in a flat bed  without using bedrails?: None Help needed moving from lying on your back to sitting on the side of a flat bed without using bedrails?: None Help needed moving to and from a bed to a chair (including a wheelchair)?: A Little Help needed standing up from a chair using your arms (e.g., wheelchair or bedside chair)?: A Little Help needed to walk in hospital room?: A Little Help needed climbing 3-5 steps with a railing? : A Little 6 Click Score: 20    End of Session Equipment Utilized During Treatment: Gait belt Activity Tolerance: Patient tolerated treatment well Patient left: in chair;with call bell/phone within reach;with chair alarm set   PT Visit Diagnosis: Unsteadiness on feet (R26.81)     Time: 1017-5102 PT Time Calculation (min) (ACUTE ONLY): 16 min  Charges:  $Gait Training: 8-22 mins                      Arby Barrette, PT Pager [phone]    Rexanne Mano 06/04/2020, 2:52 PM

## 2020-06-05 ENCOUNTER — Telehealth: Payer: Self-pay

## 2020-06-05 ENCOUNTER — Encounter (HOSPITAL_COMMUNITY): Payer: Self-pay | Admitting: Internal Medicine

## 2020-06-05 DIAGNOSIS — E039 Hypothyroidism, unspecified: Secondary | ICD-10-CM | POA: Diagnosis not present

## 2020-06-05 DIAGNOSIS — N390 Urinary tract infection, site not specified: Secondary | ICD-10-CM | POA: Diagnosis not present

## 2020-06-05 DIAGNOSIS — K219 Gastro-esophageal reflux disease without esophagitis: Secondary | ICD-10-CM | POA: Diagnosis not present

## 2020-06-05 DIAGNOSIS — N1832 Chronic kidney disease, stage 3b: Secondary | ICD-10-CM | POA: Diagnosis not present

## 2020-06-05 DIAGNOSIS — R339 Retention of urine, unspecified: Secondary | ICD-10-CM

## 2020-06-05 LAB — CBC WITH DIFFERENTIAL/PLATELET
Abs Immature Granulocytes: 0.27 10*3/uL — ABNORMAL HIGH (ref 0.00–0.07)
Basophils Absolute: 0.1 10*3/uL (ref 0.0–0.1)
Basophils Relative: 1 %
Eosinophils Absolute: 0.2 10*3/uL (ref 0.0–0.5)
Eosinophils Relative: 2 %
HCT: 25.1 % — ABNORMAL LOW (ref 39.0–52.0)
Hemoglobin: 8.5 g/dL — ABNORMAL LOW (ref 13.0–17.0)
Immature Granulocytes: 3 %
Lymphocytes Relative: 12 %
Lymphs Abs: 1.2 10*3/uL (ref 0.7–4.0)
MCH: 34.1 pg — ABNORMAL HIGH (ref 26.0–34.0)
MCHC: 33.9 g/dL (ref 30.0–36.0)
MCV: 100.8 fL — ABNORMAL HIGH (ref 80.0–100.0)
Monocytes Absolute: 0.9 10*3/uL (ref 0.1–1.0)
Monocytes Relative: 10 %
Neutro Abs: 6.8 10*3/uL (ref 1.7–7.7)
Neutrophils Relative %: 72 %
Platelets: 250 10*3/uL (ref 150–400)
RBC: 2.49 MIL/uL — ABNORMAL LOW (ref 4.22–5.81)
RDW: 12.4 % (ref 11.5–15.5)
WBC: 9.3 10*3/uL (ref 4.0–10.5)
nRBC: 0 % (ref 0.0–0.2)

## 2020-06-05 LAB — BASIC METABOLIC PANEL
Anion gap: 9 (ref 5–15)
BUN: 35 mg/dL — ABNORMAL HIGH (ref 8–23)
CO2: 24 mmol/L (ref 22–32)
Calcium: 9 mg/dL (ref 8.9–10.3)
Chloride: 102 mmol/L (ref 98–111)
Creatinine, Ser: 1.75 mg/dL — ABNORMAL HIGH (ref 0.61–1.24)
GFR, Estimated: 36 mL/min — ABNORMAL LOW (ref >60)
Glucose, Bld: 143 mg/dL — ABNORMAL HIGH (ref 70–99)
Potassium: 4.3 mmol/L (ref 3.5–5.1)
Sodium: 135 mmol/L (ref 135–145)

## 2020-06-05 LAB — GLUCOSE, CAPILLARY: Glucose-Capillary: 130 mg/dL — ABNORMAL HIGH (ref 70–99)

## 2020-06-05 MED ORDER — CEPHALEXIN 500 MG PO CAPS: 500.0000 mg | ORAL_CAPSULE | Freq: Two times a day (BID) | ORAL | 0 refills | Status: AC

## 2020-06-05 NOTE — Plan of Care (Signed)

## 2020-06-05 NOTE — Progress Notes (Signed)
Patient AVS reviewed with patient, understanding expressed, IV removal well tolerated, transported to family car via wheelchair.

## 2020-06-05 NOTE — Telephone Encounter (Signed)
Transition Care Management Follow-up Telephone Call  Date of discharge and from where: 06/05/2020, Tommy [name]   How have you been since you were released from the hospital? Patient states he is doing fine. Just weak but resting comfortably.  Any questions or concerns? No  Items Reviewed:  Did the pt receive and understand the discharge instructions provided? Yes   Medications obtained and verified? Yes   Other? No   Any new allergies since your discharge? No   Dietary orders reviewed? Yes  Do you have support at home? Yes   Home Care and Equipment/Supplies: Were home health services ordered? yes If so, what is the name of the agency? [facility]  Has the agency set up a time to come to the patient's home? no Were any new equipment or medical supplies ordered?  No What is the name of the medical supply agency? N/A Were you able to get the supplies/equipment? not applicable Do you have any questions related to the use of the equipment or supplies? No  Functional Questionnaire: (I = Independent and D = Dependent) ADLs: I  Bathing/Dressing- I  Meal Prep- I  Eating- I  Maintaining continence- I  Transferring/Ambulation- I  Managing Meds- I  Follow up appointments reviewed:   PCP Hospital f/u appt confirmed? Yes  Scheduled to see Alma Friendly, NP on 06/15/2020 @ 2 pm.  [facility] f/u appt confirmed? follow up with cardiology   Are transportation arrangements needed? No   If their condition worsens, is the pt aware to call PCP or go to the Emergency Dept.? Yes  Was the patient provided with contact information for the PCP's office or ED? Yes  Was to pt encouraged to call back with questions or concerns? Yes

## 2020-06-05 NOTE — TOC Initial Note (Signed)
Transition of Care [facility]) - Initial/Assessment Note    Patient Details  Name: Tommy [name] MRN: [id number] Date of Birth: 1926/12/01  Transition of Care [facility]) CM/SW Contact:    Joanne Chars, LCSW Phone Number: 06/05/2020, 2:53 PM  Clinical Narrative:  CSW met with pt who is agreeable to [facility] recommendation.  Choice document provided.  Permission given to speak with son Shanon Brow.  Current equipment in home: grabber only. PCP in place.  Pt is vaccinated for covid and boosted.    Per MD request, CSW spoke with son about placement concerns.  Pt has been in son's home but is having more trouble with falls and continence.  2 story home and pt bedroom is on second level.  Son had questions about placement options, discussed ALF, pt does have resources to pay for this.  Son also asking about respite care while son and wife go to [place] for daughter's college graduation.  Son arrived a hospital shortly after this and CSW provided ALF list and also list from google regarding respite care.  Following attempts were made to locate [facility] services:  [facility].  [facility]--declined due to staffing, [facility]-declined.  [facility]-declined.  [facility] finally accepted.    No equipment recommended.    Pt had already discharged by the time [facility] services were secured.  CSW spoke with son by phone and gave him the contact information for [facility].                  Expected Discharge Plan: [place] Services Barriers to Discharge: Other (comment) ([place] with [facility] Medicare payer)   Patient Goals and CMS Choice Patient states their goals for this hospitalization and ongoing recovery are:: "get back to my recent activities-driving, going to the gym" CMS Medicare.gov Compare Post Acute Care list provided to:: Patient Choice offered to / list presented to : Patient  Expected Discharge Plan and Services Expected Discharge Plan: [facility] In-house Referral: Clinical Social Work    Post Acute Care Choice: [place] arrangements for the past 2 months: [place] Expected Discharge Date: 06/05/20               DME Arranged: N/A         HH Arranged: PT HH Agency: [place] Date [place]: 06/05/20 Time HH Agency Contacted: 1430 Representative spoke with at [place]  Prior Living Arrangements/Services Living arrangements for the past 2 months: [place] Lives with:: Adult Children (son and son's family) Patient language and need for interpreter reviewed:: Yes Do you feel safe going back to the place where you live?: Yes      Need for Family Participation in Patient Care: Yes (Comment) Care giver support system in place?: Yes (comment) Current home services: Other (comment) (none) Criminal Activity/Legal Involvement Pertinent to Current Situation/Hospitalization: No - Comment as needed  Activities of Daily Living Home Assistive Devices/Equipment: None ADL Screening (condition at time of admission) Patient's cognitive ability adequate to safely complete daily activities?: Yes Is the patient deaf or have difficulty hearing?: No Does the patient have difficulty seeing, even when wearing glasses/contacts?: No Does the patient have difficulty concentrating, remembering, or making decisions?: No Patient able to express need for assistance with ADLs?: Yes Does the patient have difficulty dressing or bathing?: No Independently performs ADLs?: Yes (appropriate for developmental age) Does the patient have difficulty walking or climbing stairs?: No Weakness of Legs: Both Weakness of Arms/Hands:  Both  Permission Sought/Granted Permission sought to share information with : Family Supports Permission granted to share information with : Yes, Verbal Permission Granted  Share Information with NAME: son Shanon Brow           Emotional Assessment Appearance:: Appears stated age Attitude/Demeanor/Rapport:  Engaged Affect (typically observed): Appropriate,Pleasant Orientation: : Oriented to Self,Oriented to Place,Oriented to  Time,Oriented to Situation Alcohol / Substance Use: Not Applicable Psych Involvement: No (comment)  Admission diagnosis:  General weakness [R53.1] Acute cystitis without hematuria [P22.44] Complicated UTI (urinary tract infection) [N39.0] Acute head injury, initial encounter [S09.90XA] Acute renal failure, unspecified acute renal failure type ([place]) [N17.9] UTI (urinary tract infection) [N39.0] Patient Active Problem List   Diagnosis Date Noted  . UTI (urinary tract infection) 06/03/2020  . Acute lower UTI 06/02/2020  . Complicated UTI (urinary tract infection) 06/02/2020  . Pulmonary nodule 05/26/2020  . Urinary retention 05/26/2020  . Neck pain 05/26/2020  . 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 Preventative health care 04/03/2020  . Orthostatic lightheadedness 03/15/2020  . 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 Lightheadedness 02/14/2020  . Acute kidney injury superimposed on CKD 3b ([place]) 02/13/2020  . Postural dizziness with presyncope 02/13/2020  . Elevated troponin 02/13/2020  . Varicose veins of both lower extremities 02/04/2020  . Cerumen impaction 01/24/2020  . Nocturia 01/13/2020  . Trifascicular block 10/20/2019  . PAD (peripheral artery disease) ([place]) 10/20/2019  . Fall 08/04/2019  . Generalized weakness 06/09/2019  . First degree AV block 12/24/2018  . Bilateral carotid artery stenosis 09/17/2017  . Anemia of chronic disease 09/17/2017  . Anemia of chronic renal failure, stage 3 (moderate) ([place]) 05/23/2017  . Aortic valve sclerosis 02/04/2017  . Carotid artery disease ([place]) 02/04/2017  . H/O endarterectomy 02/04/2017  . Hyperlipidemia LDL goal <70 02/04/2017  . Essential hypertension 02/04/2017  . GERD (gastroesophageal reflux disease) 12/10/2016  . Stage 3 chronic kidney disease ([place]) 12/09/2016   . Coronary artery disease of native artery of native heart with stable angina pectoris ([place]) 12/09/2016  . Type 2 diabetes mellitus ([place]) 12/09/2016  . Hypothyroidism 12/09/2016   PCP:  Pleas Koch, NP Pharmacy:   [id number] [place], [address] [address] [place] [address] Phone: [phone] Fax: [phone]  [place], [address], [address] [place], [place] [address] [place] [address] Phone: [phone] Fax: [phone]     Social Determinants of Health (SDOH) Interventions    Readmission Risk Interventions No flowsheet data found.

## 2020-06-05 NOTE — Progress Notes (Signed)
Physical Therapy Treatment Patient Details Name: Tommy [name] MRN: [id number] DOB: February 04, 1927 Today's Date: 06/05/2020    History of Present Illness 87 y.o. 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 For a week prior to admission 06/02/20 he has been feeling weaker than usual. He c/o dysuria. He fell off the commode and came to ED via EMS.    PT Comments    Pt is progressing well, but per pt definitely not yet at baseline.  Pt still guarded and mildly unsteady at times, but still able to ambulate safely in a home-like environment without an AD.  Emphasis on warm up standing exercise, progression of gait stability without AD and negotiation of stairs simulated to his home environment.    Follow Up Recommendations  Home health PT;Supervision for mobility/OOB     Equipment Recommendations  None recommended by PT    Recommendations for Other Services       Precautions / Restrictions Precautions Precautions: Fall Precaution Comments: prior to fall at admission; prior to that fell May 2021 (pushing 2 suitcases through airport and hit a curb cut and went down)    Mobility  Bed Mobility Overal bed mobility: Modified Independent             General bed mobility comments: up in recliner    Transfers Overall transfer level: Needs assistance Equipment used: None Transfers: Sit to/from Stand Sit to Stand: Supervision         General transfer comment: mild unsteadiness at times.  Ambulation/Gait Ambulation/Gait assistance: Min guard Gait Distance (Feet): 450 Feet (total with 1 sitting rest at about 300 feet) Assistive device: None Gait Pattern/deviations: Decreased stride length;Wide base of support Gait velocity: improves with cues Gait velocity interpretation: 1.31 - 2.62 ft/sec, indicative of limited community ambulator General Gait Details: Again, generally guarded initially with  more stability with increased speed, but then more unsteadiness with fatigue.   Stairs Stairs: Yes Stairs assistance: Min guard Stair Management: One rail Right;Step to pattern;Alternating pattern;Forwards Number of Stairs: 12 General stair comments: pt needed guarding due to noticeable fatigue and misteps and toe stubbing on the right without LOB on ascent.  More steady on descent.   Wheelchair Mobility    Modified Rankin (Stroke Patients Only)       Balance Overall balance assessment: Needs assistance Sitting-balance support: No upper extremity supported;Feet supported Sitting balance-Leahy Scale: Good     Standing balance support: No upper extremity supported;During functional activity Standing balance-Leahy Scale: Fair                              Cognition Arousal/Alertness: Awake/alert Behavior During Therapy: WFL for tasks assessed/performed Overall Cognitive Status: Within Functional Limits for tasks assessed                                        Exercises General Exercises - Lower Extremity Hip ABduction/ADduction: AROM;Strengthening;Both;10 reps;Standing Hip Flexion/Marching: AROM;Strengthening;10 reps;Standing    General Comments        Pertinent Vitals/Pain Pain Assessment: No/denies pain    Home Living                      Prior Function            PT Goals (current  goals can now be found in the care plan section) Acute Rehab PT Goals Patient Stated Goal: to go home and get back to the gym PT Goal Formulation: With patient Time For Goal Achievement: 06/17/20 Potential to Achieve Goals: Good Progress towards PT goals: Progressing toward goals    Frequency    Min 3X/week      PT Plan Current plan remains appropriate    Co-evaluation              AM-PAC PT "6 Clicks" Mobility   Outcome Measure  Help needed turning from your back to your side while in a flat bed without using bedrails?:  None Help needed moving from lying on your back to sitting on the side of a flat bed without using bedrails?: None Help needed moving to and from a bed to a chair (including a wheelchair)?: A Little Help needed standing up from a chair using your arms (e.g., wheelchair or bedside chair)?: A Little Help needed to walk in hospital room?: A Little Help needed climbing 3-5 steps with a railing? : A Little 6 Click Score: 20    End of Session Equipment Utilized During Treatment: Gait belt Activity Tolerance: Patient tolerated treatment well Patient left: in chair;with call bell/phone within reach;with chair alarm set;with family/visitor present;with nursing/sitter in room Nurse Communication: Mobility status       Time: 0981-1914 PT Time Calculation (min) (ACUTE ONLY): 21 min  Charges:  $Gait Training: 8-22 mins                     06/05/2020  Ginger Carne., PT Acute Rehabilitation Services [phone]  (pager) [phone]  (office)   Tessie Fass Raeden Schippers 06/05/2020, 11:11 AM

## 2020-06-05 NOTE — Discharge Summary (Addendum)
Discharge Summary  Tommy [name] FTD:[id number] DOB: 1926-08-17  PCP: Tommy Koch, NP  Admit date: 06/02/2020 Discharge date: 06/05/2020  Time spent: 40 mins  Recommendations for Outpatient Follow-up:  1. Follow-up with PCP in 1 week 2. Follow-up with cardiology as scheduled    Discharge Diagnoses:  Active Hospital Problems   Diagnosis Date Noted  . UTI (urinary tract infection) 06/03/2020  . Acute lower UTI 06/02/2020  . Complicated UTI (urinary tract infection) 06/02/2020  . Urinary retention 05/26/2020  . Acute kidney injury superimposed on CKD 3b ([place]) 02/13/2020  . Anemia of chronic renal failure, stage 3 (moderate) ([place]) 05/23/2017  . GERD (gastroesophageal reflux disease) 12/10/2016  . Stage 3 chronic kidney disease ([place]) 12/09/2016  . Type 2 diabetes mellitus ([place]) 12/09/2016  . Hypothyroidism 12/09/2016    Resolved Hospital Problems  No resolved problems to display.    Discharge Condition: Stable  Diet recommendation: Cardiac/modified carb  Vitals:   06/04/20 2106 06/05/20 0610  BP: (!) 124/46 135/68  Pulse: 69 70  Resp: 16 16  Temp: 98.2 F (36.8 C) 97.9 F (36.6 C)  SpO2: 96% 98%    History of present illness:  Tommy Wachsmuth Priceis a 87 y.o.malewith medical history significant ofCAD s/p CABG, DM2, HFpEF, GERD, HTN, dysfunctional bladder on self cath. For a week he has been feeling weaker than usual. He c/o dysuria. He was seen for urology f/u 06/01/20 and was thought to be stable but still requiring CIC. Pt was doing self-cath and was to weak to complete the procedure and fell off the commode. EMS activated and he is transported to [facility] for evaluation. In the ED, T 97.9 157/79 HR 67 RR 13, EDMD exam notable for frehead abrasion left w/o hematoma. Exam otherwise nl. CT head w/o acute abnl. Cmet with glucose 161 Cr 1.92 ( baseline 1.40-1.50) Hgb 9.2 - chronic, WBC 14.9. U/A large LE, many bacteria, 21-50 wbc/hpf. CXR with left basilar  atelectasis. Patient started on ceftriaxone. TRH called to admit for continued treatment.     Today, patient denies any new complaints, discussed extensively with his son on 06/04/2020, has concerns about patient's frequent falls recently.  Son stated patient lives with him, and has to go up a flight of stairs to his bedroom.  There are no bedrooms downstairs.  Son wanted to know more about ILF/ALF.  Social worker discussed extensively with son about how to proceed as an outpatient.  Discharged on home health PT    Hospital Course:  Active Problems:   Stage 3 chronic kidney disease (HCC)   Type 2 diabetes mellitus (HCC)   Hypothyroidism   GERD (gastroesophageal reflux disease)   Anemia of chronic renal failure, stage 3 (moderate) (HCC)   Acute kidney injury superimposed on CKD 3b (HCC)   Urinary retention   Acute lower UTI   Complicated UTI (urinary tract infection)   UTI (urinary tract infection)   UTI Currently afebrile,  resolved leukocytosis UA positive for infection UC growing >100, 000 Proteus mirabilis S/P ceftriaxone--> switch to p.o. Keflex to complete 7 days of antibiotics due to history of self-catheterization  Dysfunctional bladder Continues education for sterile procedure for self-care Continue Flomax Outpatient urology  CKD stage IIIb Creatinine at baseline S/p IV fluid  Chronic diastolic HF BNP 062.3, elevated from previous Chest x-ray showed minimal left basilar atelectasis, no pulmonary congestion Last echo done on 12/21 showed EF of 60 to 76%, grade 3 diastolic dysfunction Outpatient follow-up with cardiology  Hyponatremia Improved  Hypertension BP stable  Diabetes mellitus type 2 A1c 7.1 Continue home regimen  Anemia of chronic kidney disease Hemoglobin at baseline Continue oral iron supplementation  CAD/Hyperlipidemia Continue statins, ranolazine (cardiology consider adjusting dose due to Cr clearance)  Hypothyroidism Continue  Synthroid    Estimated body mass index is 22.24 kg/m as calculated from the following:   Height as of this encounter: _0  (1.778 m).   Weight as of this encounter: 70.3 kg.    Procedures:  None  Consultations:  None  Discharge Exam: BP 135/68 (BP Location: Right Arm)   Pulse 70   Temp 97.9 F (36.6 C)   Resp 16   Ht _1  (1.778 m)   Wt 70.3 kg   SpO2 98%   BMI 22.24 kg/m   General: NAD Cardiovascular: S1, S2 present Respiratory: CTA B    Discharge Instructions You were cared for by a hospitalist during your hospital stay. If you have any questions about your discharge medications or the care you received while you were in the hospital after you are discharged, you can call the unit and asked to speak with the hospitalist on call if the hospitalist that took care of you is not available. Once you are discharged, your primary care physician will handle any further medical issues. Please note that NO REFILLS for any discharge medications will be authorized once you are discharged, as it is imperative that you return to your primary care physician (or establish a relationship with a primary care physician if you do not have one) for your aftercare needs so that they can reassess your need for medications and monitor your lab values.  Discharge Instructions    Diet - low sodium heart healthy   Complete by: As directed    Increase activity slowly   Complete by: As directed    No wound care   Complete by: As directed      Allergies as of 06/05/2020      Reactions   Heparin Other (See Comments)   "it caused low platelet count and made him have a clot"   Patiromer Other (See Comments)   Unknown reaction   Sodium Zirconium Cyclosilicate Other (See Comments)   Unknown reaction      Medication List    STOP taking these medications   acetaminophen 325 MG tablet Commonly known as: Tylenol     TAKE these medications   ALIGN PO Take 1 tablet by mouth daily.    aspirin EC 81 MG tablet Take 81 mg by mouth at bedtime. Swallow whole.   atorvastatin 40 MG tablet Commonly known as: LIPITOR TAKE 1 TABLET BY MOUTH  DAILY What changed: when to take this   betamethasone valerate 0.1 % cream Commonly known as: VALISONE APPLY TO AFFECTED AREA TWICE A DAY What changed: See the new instructions.   calcium carbonate 1500 (600 Ca) MG Tabs tablet Commonly known as: OSCAL Take 600 mg of elemental calcium by mouth at bedtime.   cephALEXin 500 MG capsule Commonly known as: KEFLEX Take 1 capsule (500 mg total) by mouth 2 (two) times daily for 3 days.   cholecalciferol 25 MCG (1000 UNIT) tablet Commonly known as: VITAMIN D3 Take 1,000 Units by mouth at bedtime.   diclofenac Sodium 1 % Gel Commonly known as: VOLTAREN Apply 1 application topically daily as needed (pain).   epoetin alfa 10000 UNIT/ML injection Commonly known as: EPOGEN Inject 20,000 Units into the skin See admin instructions. Inject 20000 units subcutaneously  if hemoglobin <10 - checked monthly   ferrous sulfate 325 (65 FE) MG tablet Take 325-650 mg by mouth See admin instructions. Take 2 tablets (650 mg) by mouth every morning and 1 tablet (325 mg) at night   Fish Oil 1200 MG Caps Take 2,400 mg by mouth 2 (two) times daily.   glimepiride 4 MG tablet Commonly known as: AMARYL TAKE 1 TABLET BY MOUTH  DAILY WITH BREAKFAST   insulin glargine 100 UNIT/ML Solostar Pen Commonly known as: LANTUS Inject 6 Units into the skin at bedtime.   Insulin Pen Needle 29G X 12.7MM Misc BD Ultrafine Pen Needle Use as instructed to inject insulin daily   B-D ULTRAFINE III SHORT PEN 31G X 8 MM Misc Generic drug: Insulin Pen Needle USE AS INSTRUCTED TO INJECT INSULIN DAILY.   levothyroxine 175 MCG tablet Commonly known as: SYNTHROID Take 175 mcg by mouth daily before breakfast.   meclizine 12.5 MG tablet Commonly known as: ANTIVERT Take 1 tablet (12.5 mg total) by mouth 3 (three) times  daily as needed for dizziness.   METAMUCIL PO Take by mouth See admin instructions. Mix one heaping teaspoonful in apple juice and drink daily   multivitamin with minerals Tabs tablet Take 1 tablet by mouth daily.   nitroGLYCERIN 0.4 MG SL tablet Commonly known as: NITROSTAT Place 0.4 mg under the tongue every 5 (five) minutes as needed for chest pain.   OneTouch Delica Plus [id number] Misc USE AS INSTRUCT TO TEST BLOOD SUGAR ONCE DAILY   OneTouch Verio test strip Generic drug: glucose blood USE AS INSTRUCTED TO TEST BLOOD SUGAR ONCE DAILY   OneTouch Verio w/Device Kit 1 Device by Does not apply route daily. Use as instructed to test blood sugar daily   pantoprazole 20 MG tablet Commonly known as: PROTONIX TAKE 1 TABLET BY MOUTH  DAILY   PROCTOSOL HC RE Place 1 Dose rectally daily as needed (hemorrhoids/itching).   ranolazine 1000 MG SR tablet Commonly known as: RANEXA Take 1 tablet (1,000 mg total) by mouth 2 (two) times daily.   tamsulosin 0.4 MG Caps capsule Commonly known as: FLOMAX Take 1 capsule (0.4 mg total) by mouth daily after supper.   timolol 0.5 % ophthalmic solution Commonly known as: TIMOPTIC Place 1 drop into both eyes daily.      Allergies  Allergen Reactions  . Heparin Other (See Comments)    "it caused low platelet count and made him have a clot"  . Patiromer Other (See Comments)    Unknown reaction  . Sodium Zirconium Cyclosilicate Other (See Comments)    Unknown reaction    Follow-up Information    Tommy Koch, NP. Schedule an appointment as soon as possible for a visit in 1 week(s).   Specialty: Internal Medicine Contact information: [place] [address] [phone]        Nelva Bush, MD .   Specialty: Cardiology Contact information: [address] [place] [address] [phone]        Vickie Epley, MD .   Specialties: Cardiology, Radiology Contact information: [place] [address] [phone]                The results of significant diagnostics from this hospitalization (including imaging, microbiology, ancillary and laboratory) are listed below for reference.    Significant Diagnostic Studies: CT Head Wo Contrast  Result Date: 06/02/2020 CLINICAL DATA:  Head injury after fall. EXAM: CT  HEAD WITHOUT CONTRAST TECHNIQUE: Contiguous axial images were obtained from the base of the skull through the vertex without intravenous contrast. COMPARISON:  February 13, 2020. FINDINGS: Brain: Moderate diffuse cortical atrophy is noted. Mild chronic ischemic white matter disease is noted. No mass effect or midline shift is noted. Ventricular size is within normal limits. There is no evidence of mass lesion, hemorrhage or acute infarction. Vascular: No hyperdense vessel or unexpected calcification. Skull: Normal. Negative for fracture or focal lesion. Sinuses/Orbits: No acute finding. Other: None. IMPRESSION: Moderate diffuse cortical atrophy. Mild chronic ischemic white matter disease. No acute intracranial abnormality seen. Electronically Signed   By: Marijo Conception M.D.   On: 06/02/2020 15:02   CT Cervical Spine Wo Contrast  Result Date: 05/19/2020 CLINICAL DATA:  Neck trauma (Age >= 65y) Posterior neck pain going to both shoulders. EXAM: CT CERVICAL SPINE WITHOUT CONTRAST TECHNIQUE: Multidetector CT imaging of the cervical spine was performed without intravenous contrast. Multiplanar CT image reconstructions were also generated. COMPARISON:  CT 02/13/2020 FINDINGS: Alignment: Stable grade 1 anterolisthesis of C3 on C4. Skull base and vertebrae: No acute fracture. No primary bone lesion or focal pathologic process. Dens and skull base are intact. Calcified pannus at C1-C2 as seen on prior exam, no significant change. Soft tissues and spinal canal: No prevertebral fluid or swelling. No visible canal hematoma. Disc levels: Degenerative disc  disease C4-C5 through C6-C7. Multilevel facet hypertrophy. Multilevel bony neural foraminal stenosis most prominent at C6-C7. Upper chest: No acute findings. Other: None. IMPRESSION: 1. No acute fracture or subluxation of the cervical spine. 2. Multilevel degenerative disc disease and facet hypertrophy. Electronically Signed   By: Keith Rake M.D.   On: 05/19/2020 17:04   [place] Lower Ext Art Right Ltd  Result Date: 05/19/2020 CLINICAL DATA:  Abnormal femoral artery/vein on CT. Evaluate for AV fistula or pseudoaneurysm. EXAM: ULTRASOUND RIGHT LOWER EXTREMITY LIMITED TECHNIQUE: Ultrasound examination of the lower extremity vasculature was performed in the area of clinical concern. COMPARISON:  CT earlier today FINDINGS: Targeted sonographic evaluation of the area of clinical concern involving the common femoral artery and vein. Blood flow is noted between the common femoral artery and femoral vein most consistent with AV fistula. No definite filling defect within the visualized vascular structures to suggest thrombus. IMPRESSION: Findings suspicious for arteriovenous fistula involving the common femoral artery and vein. Electronically Signed   By: Keith Rake M.D.   On: 05/19/2020 19:30   DG Chest Portable 1 View  Result Date: 06/02/2020 CLINICAL DATA:  Cough. EXAM: PORTABLE CHEST 1 VIEW COMPARISON:  February 13, 2020. FINDINGS: The heart size and mediastinal contours are within normal limits. No pneumothorax or pleural effusion is noted. Right lung is clear. Minimal left basilar subsegmental atelectasis is noted. The visualized skeletal structures are unremarkable. IMPRESSION: Minimal left basilar subsegmental atelectasis. Electronically Signed   By: Marijo Conception M.D.   On: 06/02/2020 14:14   CT Angio Chest/Abd/Pel for Dissection W and/or Wo Contrast  Result Date: 05/19/2020 CLINICAL DATA:  87 year old male with reported history of abdominal pain and suspected aortic dissection. EXAM: CT  ANGIOGRAPHY CHEST, ABDOMEN AND PELVIS TECHNIQUE: Non-contrast CT of the chest was initially obtained. Multidetector CT imaging through the chest, abdomen and pelvis was performed using the standard protocol during bolus administration of intravenous contrast. Multiplanar reconstructed images and MIPs were obtained and reviewed to evaluate the vascular anatomy. CONTRAST:  12m OMNIPAQUE IOHEXOL 350 MG/ML SOLN COMPARISON:  None FINDINGS: CTA CHEST FINDINGS Cardiovascular: Noncontrast imaging  with extensive atheromatous plaque in the thoracic aorta, signs of coronary revascularization and coronary artery disease. No pericardial effusion or intramural hematoma. Aortic caliber in the chest with no evidence of aneurysm. Normal caliber aortic arch. No signs of thoracic aortic dissection. Three-vessel branching pattern. Mild dilation of the RIGHT subclavian artery just past its origin. Heart size is enlarged. Central pulmonary vasculature is unremarkable though with limited assessment based on phase of contrast enhancement. Mediastinum/Nodes: Grossly normal appearance of the esophagus. No axillary lymphadenopathy. No mediastinal lymphadenopathy. No hilar lymphadenopathy. No thoracic inlet adenopathy. No mediastinal hematoma. Lungs/Pleura: Mild subpleural reticulation in the chest. Airways are patent. No sign of consolidation, effusion or pneumothorax. Small pulmonary nodule in the RIGHT lung base at 6 mm (image 111, series 6) another small adjacent nodule similar size on image 108 of series 6. Musculoskeletal: See below for full musculoskeletal detail. Spinal degenerative changes about the thoracic spine visualized clavicles and scapulae are unremarkable. Post median sternotomy. Sternum otherwise normal. Review of the MIP images confirms the above findings. CTA ABDOMEN AND PELVIS FINDINGS VASCULAR Aorta: Moderate calcific and noncalcific atheromatous plaque of the abdominal aorta. Mild aneurysmal dilation at 3.3 x 2.9 cm  greatest dimension. This is in the infrarenal abdominal segment. No signs of dissection or vasculitis. Celiac: Celiac is widely patent with scattered atheromatous plaque within the vessel along its course. No splenic artery aneurysm. Classic hepatic arterial anatomy. SMA: SMA is widely patent with scattered atheromatous changes. No aneurysmal dilation. No sign of vasculitis. Renals: Renal arteries are patent, single renal arteries bilaterally. IMA: IMA is patent. Inflow: Patent without evidence of aneurysm, dissection, vasculitis or significant stenosis. Proximal outflow: Iliac vessels are patent with ectatic RIGHT common iliac artery 1.6 cm. Early filling of the RIGHT femoral vein and abundant collaterals tracking along obturator and body wall pathways. Luminal heterogeneity of the RIGHT common femoral vein more likely heterogeneity with unopacified blood passing into an area of potential AV fistula. This would be a small fistula and the RIGHT iliac venous system is diminutive. Veins: Diminutive RIGHT iliac veins suggest chronic occlusion perhaps from prior DVT given the presence of an IVC filter which is in place just below the level of the renal veins. Review of the MIP images confirms the above findings. NON-VASCULAR Hepatobiliary: Liver without focal, suspicious hepatic lesion. Gallstones layer in the dependent gallbladder. No pericholecystic stranding. Limited assessment on true arterial phase. Pancreas: Normal, without mass, inflammation or ductal dilatation. Spleen: Spleen normal size and contour. Adrenals/Urinary Tract: Adrenal glands are normal. Symmetric renal enhancement without hydronephrosis. Small cyst in the upper pole the LEFT kidney. Signs of renal cortical scarring. Marked distension of the urinary bladder extending into the low abdomen. No perivesical stranding. Stomach/Bowel: Small hiatal hernia. Small periampullary duodenal diverticulum. The appendix is normal. Small bowel without obstruction.  Colon without acute process and with colonic diverticulosis. Lymphatic: There is no gastrohepatic or hepatoduodenal ligament lymphadenopathy. No retroperitoneal or mesenteric lymphadenopathy. Reproductive: Prostatomegaly with heterogeneity is a nonspecific finding on CT. Other: No ascites.  No free air. Musculoskeletal: Spinal degenerative changes. No acute or destructive bone process. Visualized clavicles and scapulae are unremarkable. No rib fractures, no destructive bone findings. Review of the MIP images confirms the above findings. IMPRESSION: 1. No signs of acute aortic dissection. 2. Marked distension of the urinary bladder in the setting of prostatomegaly strongly suggestive of bladder outlet obstruction, correlate with symptoms to determine whether this could be an alternative cause for the patient's symptoms. 3. 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 Filling defects within this area could represent webs from prior thrombus or mixing artifact. 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 Consider sonographic assessment of this area to assess for arteriovenous fistula and to assess for any thrombus in this location. Collateral pathways are present about the pelvis. 4. 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 Could consider the following as clinically warranted. Recommend follow-up ultrasound every 3 years. This recommendation follows ACR consensus guidelines: White Paper of the ACR Incidental Findings Committee II on Vascular Findings. J Am Coll Radiol 2013; 10:789-794. 5. IVC filter in situ. 6. Six mm pulmonary nodule in the RIGHT lung base. Non-contrast chest CT at 6-12 months is recommended. If the nodule is stable at time of repeat CT, then future CT at 18-24 months (from today's scan) is considered optional for low-risk patients, but is recommended for  high-risk patients. This recommendation follows the consensus statement: Guidelines for Management of Incidental Pulmonary Nodules Detected on CT Images: From the Fleischner Society 2017; Radiology 2017; 284:228-243. 7. Cholelithiasis. 8. Aortic atherosclerosis. These results were called by telephone at the time of interpretation on 05/19/2020 at 5:36 pm to provider Duffy Bruce , who verbally acknowledged these results. Aortic Atherosclerosis (ICD10-I70.0). Electronically Signed   By: Zetta Bills M.D.   On: 05/19/2020 17:35    Microbiology: Recent Results (from the past 240 hour(s))  Urine culture     Status: Abnormal   Collection Time: 06/02/20  1:19 PM   Specimen: Urine, Random  Result Value Ref Range Status   Specimen Description URINE, RANDOM  Final   Special Requests   Final    NONE Performed at [facility], [address], [place], [place] [address]    Culture >=100,000 COLONIES/mL PROTEUS MIRABILIS (A)  Final   Report Status 06/04/2020 FINAL  Final   Organism ID, Bacteria PROTEUS MIRABILIS (A)  Final      Susceptibility   Proteus mirabilis - MIC*    AMPICILLIN <=2 SENSITIVE Sensitive     CEFAZOLIN <=4 SENSITIVE Sensitive     CEFEPIME <=0.12 SENSITIVE Sensitive     CEFTRIAXONE <=0.25 SENSITIVE Sensitive     CIPROFLOXACIN <=0.25 SENSITIVE Sensitive     GENTAMICIN <=1 SENSITIVE Sensitive     IMIPENEM 2 SENSITIVE Sensitive     NITROFURANTOIN 128 RESISTANT Resistant     TRIMETH/SULFA <=20 SENSITIVE Sensitive     AMPICILLIN/SULBACTAM <=2 SENSITIVE Sensitive     PIP/TAZO <=4 SENSITIVE Sensitive     * >=100,000 COLONIES/mL PROTEUS MIRABILIS  Resp Panel by RT-PCR (Flu A&B, Covid) Nasopharyngeal Swab     Status: None   Collection Time: 06/02/20  4:41 PM   Specimen: Nasopharyngeal Swab; Nasopharyngeal(NP) swabs in vial transport medium  Result Value Ref Range Status   SARS Coronavirus 2 by RT PCR NEGATIVE NEGATIVE Final    Comment: (NOTE) SARS-CoV-2 target nucleic acids  are NOT DETECTED.  The SARS-CoV-2 RNA is generally detectable in upper respiratory specimens during the acute phase of infection. The lowest concentration of SARS-CoV-2 viral copies this assay can detect is 138 copies/mL. A negative result does not preclude SARS-Cov-2 infection and should not be used as the sole basis for treatment or other patient management decisions. A negative result may occur with  improper specimen collection/handling, submission of specimen other than nasopharyngeal swab, presence of viral mutation(s) within the areas targeted by  this assay, and inadequate number of viral copies(<138 copies/mL). A negative result must be combined with clinical observations, patient history, and epidemiological information. The expected result is Negative.  Fact Sheet for Patients:  EntrepreneurPulse.com.au  Fact Sheet for Healthcare Providers:  IncredibleEmployment.be  This test is no t yet approved or cleared by the [place] FDA and  has been authorized for detection and/or diagnosis of SARS-CoV-2 by FDA under an Emergency Use Authorization (EUA). This EUA will remain  in effect (meaning this test can be used) for the duration of the COVID-19 declaration under Section 564(b)(1) of the Act, 21 U.S.C.section 360bbb-3(b)(1), unless the authorization is terminated  or revoked sooner.       Influenza A by PCR NEGATIVE NEGATIVE Final   Influenza B by PCR NEGATIVE NEGATIVE Final    Comment: (NOTE) The Xpert Xpress SARS-CoV-2/FLU/RSV plus assay is intended as an aid in the diagnosis of influenza from Nasopharyngeal swab specimens and should not be used as a sole basis for treatment. Nasal washings and aspirates are unacceptable for Xpert Xpress SARS-CoV-2/FLU/RSV testing.  Fact Sheet for Patients: EntrepreneurPulse.com.au  Fact Sheet for Healthcare Providers: IncredibleEmployment.be  This test is  not yet approved or cleared by the [place] FDA and has been authorized for detection and/or diagnosis of SARS-CoV-2 by FDA under an Emergency Use Authorization (EUA). This EUA will remain in effect (meaning this test can be used) for the duration of the COVID-19 declaration under Section 564(b)(1) of the Act, 21 U.S.C. section 360bbb-3(b)(1), unless the authorization is terminated or revoked.  Performed at [facility], [place] [address], [place], [place] [address]      Labs: Basic Metabolic Panel: Recent Labs  Lab 06/02/20 1248 06/03/20 0102 06/04/20 0027 06/05/20 0404  NA 131* 126* 131* 135  K 4.8 4.6 4.3 4.3  CL 97* 96* 99 102  CO2 24 21* 25 24  GLUCOSE 161* 263* 217* 143*  BUN 45* 41* 35* 35*  CREATININE 1.92* 1.80* 1.64* 1.75*  CALCIUM 9.2 8.7* 8.7* 9.0   Liver Function Tests: Recent Labs  Lab 06/02/20 1248  AST 54*  ALT 42  ALKPHOS 97  BILITOT 0.6  PROT 7.2  ALBUMIN 2.5*   No results for input(s): LIPASE, AMYLASE in the last 168 hours. No results for input(s): AMMONIA in the last 168 hours. CBC: Recent Labs  Lab 06/02/20 1248 06/03/20 0102 06/04/20 0027 06/05/20 0404  WBC 14.9* 12.3* 13.0* 9.3  NEUTROABS 13.0*  --  10.8* 6.8  HGB 9.2* 9.2* 8.9* 8.5*  HCT 27.9* 26.5* 25.7* 25.1*  MCV 104.1* 99.6 101.6* 100.8*  PLT 241 243 244 250   Cardiac Enzymes: No results for input(s): CKTOTAL, CKMB, CKMBINDEX, TROPONINI in the last 168 hours. BNP: BNP (last 3 results) Recent Labs    02/13/20 1557 06/03/20 0102  BNP 64.7 516.4*    ProBNP (last 3 results) No results for input(s): PROBNP in the last 8760 hours.  CBG: Recent Labs  Lab 06/04/20 0744 06/04/20 1209 06/04/20 1609 06/04/20 2052 06/05/20 0730  GLUCAP 157* 162* 144* 201* 130*       Signed:  Alma Friendly, MD [facility] 06/05/2020, 12:13 PM

## 2020-06-08 NOTE — Telephone Encounter (Signed)
noted 

## 2020-06-14 ENCOUNTER — Other Ambulatory Visit: Payer: Self-pay | Admitting: Urology

## 2020-06-14 MED ORDER — TAMSULOSIN HCL 0.4 MG PO CAPS: 0.4000 mg | ORAL_CAPSULE | Freq: Every day | ORAL | 3 refills | Status: AC

## 2020-06-14 NOTE — Progress Notes (Signed)
Tamsulosin 0.4 mg, #90, 1 tablet daily with 3 refills sent to the [facility] in [place].

## 2020-06-15 ENCOUNTER — Encounter: Payer: Self-pay | Admitting: Primary Care

## 2020-06-15 ENCOUNTER — Other Ambulatory Visit: Payer: Self-pay

## 2020-06-15 ENCOUNTER — Ambulatory Visit: Payer: Medicare Other | Admitting: Primary Care

## 2020-06-15 ENCOUNTER — Telehealth: Payer: Self-pay

## 2020-06-15 DIAGNOSIS — R339 Retention of urine, unspecified: Secondary | ICD-10-CM | POA: Diagnosis not present

## 2020-06-15 DIAGNOSIS — N390 Urinary tract infection, site not specified: Secondary | ICD-10-CM | POA: Diagnosis not present

## 2020-06-15 LAB — POC URINALSYSI DIPSTICK (AUTOMATED)
Bilirubin, UA: NEGATIVE
Blood, UA: NEGATIVE
Glucose, UA: NEGATIVE
Ketones, UA: NEGATIVE
Leukocytes, UA: NEGATIVE
Nitrite, UA: NEGATIVE
Protein, UA: POSITIVE — AB
Spec Grav, UA: 1.015 (ref 1.010–1.025)
Urobilinogen, UA: NEGATIVE E.U./dL — AB
pH, UA: 5.5 (ref 5.0–8.0)

## 2020-06-15 NOTE — Assessment & Plan Note (Signed)
None since hospital stay. Continue tamsulosin 0.4 mg.   Discussed that Unisom could contribute to retention and to use with caution.   Follow up with Urology as scheduled.

## 2020-06-15 NOTE — Progress Notes (Signed)
Subjective:    Patient ID: Tommy [name], male    DOB: [birth year], 87 y.o.   MRN: [id number]  HPI  Tommy [name] is a very pleasant 87 y.o. male with a history of CAD, hypertension, PAD, orthostatic dizziness, type 2 diabetes, hypothyroidism, CKD, anemia, recurrent UTI, urinary retention, who presents today for hospital follow up.  He presented to [facility] on 06/02/20 via EMS for generalized weakness and fall while on a stool in the bathroom attempting to self catheterize. He did hit his head. Work up in the ED consistent for acute cystitis, was admitted for further treatment.   During his hospital stay he was treated with IV Rocephin and hydration with IV fluids for UTI and AKI. Urine culture grew proteus mirabilis. Renal function improved to baseline. He was discharged home on 06/05/20 with recommendations for PCP and Urology follow up, oral Keflex for a 7 day course total. Home health PT was ordered.   Since his discharge home he's doing much better. He's been visited by PT for a few sessions. He denies falls since prior to his hospital stay. He completed his cephalexin. He has an appointment with the Urologist scheduled for next week. He has not had to self catheterize himself since the hospital stay. Has been urinating own his own well.   He resumed his Unisom two nights ago, slept the best he's slept in weeks. He would like to resume Unisom nightly. He is compliant to tamsulosin 0.4 mg daily.    Review of Systems  Constitutional: Negative for fever.  Genitourinary: Negative for difficulty urinating, frequency and urgency.         Past Medical History:  Diagnosis Date  . Anemia   . Carotid artery occlusion    a. 2004 s/p L carotid endarterectomy; b. 02/2019 U/S: 1-39% bilat ICA stenosis. <50% bilat CCA stenosis; c. 02/2020 U/S: 50-69% RICA, s/p L CEA w/o hemodynamically signif narrowing.  . CKD (chronic kidney disease) stage 3, GFR 30-59 ml/min (HCC)   . Coronary artery  disease    a. 1994 s/p CABG x 3, [place], [place] (LIMA->LAD, VG->dRCA, VG->unknown vessel); b. 02/2016 Cath: LM 99d, LAD 100ost, LCX 100ost, RCA 80ost, diffuse 85-81m LIMA->LAD ok, VG->RCA ok. VG->unknown vessel 100.  . Diabetes mellitus without complication ([facility]   . Diastolic dysfunction    a. 02/2017 Echo: EF 55-65%, no rwma. Nl DD. Ao sclerosis w/o stenosis. Mod TR. Mild MR; b. 02/2020 Echo: EF 60-65%, no rwma, Gr2 DD, nl RV size/fxn.   .Marland KitchenDyslipidemia   . GERD (gastroesophageal reflux disease)   . Hemorrhoids   . 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 Hypertension   . Incontinence of feces 10/28/2019  . [place]monary embolism ([place]   . Thyroid disease     Social History   Socioeconomic History  . Marital status: Widowed    Spouse name: Not on file  . Number of children: 4  . Years of education: 16  . Highest education level: Bachelor's degree (e.g., BA, AB, BS)  Occupational History  . Occupation: retired  Tobacco Use  . Smoking status: Former Smoker    Packs/day: 1.50    Years: 25.00    Pack years: 37.50    Types: Cigarettes    Quit date: 1978    Years since quitting: 44.3  . Smokeless tobacco: Never Used  Vaping Use  . Vaping Use: Never used  Substance and Sexual Activity  . Alcohol use: Yes    Alcohol/week: 1.0 standard drink  Types: 1 Glasses of wine per week    Comment: socially   . Drug use: No  . Sexual activity: Not Currently  Other Topics Concern  . Not on file  Social History Narrative   ** Merged History Encounter **       Widower. 4 children, 7 grandchildren, 1 great grandchild. 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    Social Determinants of Health   Financial Resource Strain: Not on file  Food Insecurity: Not on file  Transportation Needs: Not on file  Physical Activity: Not on file  Stress: Not on file  Social Connections: Not on file  Intimate Partner Violence: Not on file    Past  Surgical History:  Procedure Laterality Date  . CAROTID ENDARTERECTOMY Left 2004  . CORONARY ARTERY BYPASS GRAFT  1994   [name]  . TONSILLECTOMY AND ADENOIDECTOMY      Family History  Problem Relation Age of Onset  . Heart attack Mother   . Heart disease Mother   . Stroke Mother   . Diabetes Father   . Coronary artery disease Sister     Allergies  Allergen Reactions  . Heparin Other (See Comments)    "it caused low platelet count and made him have a clot"  . Patiromer Other (See Comments)    Unknown reaction  . Sodium Zirconium Cyclosilicate Other (See Comments)    Unknown reaction    Current Outpatient Medications on File Prior to Visit  Medication Sig Dispense Refill  . aspirin EC 81 MG tablet Take 81 mg by mouth at bedtime. Swallow whole.    Marland Kitchen atorvastatin (LIPITOR) 40 MG tablet TAKE 1 TABLET BY MOUTH  DAILY (Patient taking differently: Take 40 mg by mouth at bedtime.) 90 tablet 3  . B-D ULTRAFINE III SHORT PEN 31G X 8 MM MISC USE AS INSTRUCTED TO INJECT INSULIN DAILY. 100 each 5  . betamethasone valerate (VALISONE) 0.1 % cream APPLY TO AFFECTED AREA TWICE A DAY (Patient taking differently: Apply 1 application topically daily.) 30 g 0  . Blood Glucose Monitoring Suppl (ONETOUCH VERIO) w/Device KIT 1 Device by Does not apply route daily. Use as instructed to test blood sugar daily 1 kit 0  . calcium carbonate (OSCAL) 1500 (600 Ca) MG TABS tablet Take 600 mg of elemental calcium by mouth at bedtime.    . cholecalciferol (VITAMIN D3) 25 MCG (1000 UT) tablet Take 1,000 Units by mouth at bedtime.    . diclofenac Sodium (VOLTAREN) 1 % GEL Apply 1 application topically daily as needed (pain).    Marland Kitchen epoetin alfa (EPOGEN,PROCRIT) 91505 UNIT/ML injection Inject 20,000 Units into the skin See admin instructions. Inject 20000 units subcutaneously  if hemoglobin <10 - checked monthly    . ferrous sulfate 325 (65 FE) MG tablet Take 325-650 mg by mouth See admin instructions. Take  2 tablets (650 mg) by mouth every morning and 1 tablet (325 mg) at night    . glimepiride (AMARYL) 4 MG tablet TAKE 1 TABLET BY MOUTH  DAILY WITH BREAKFAST (Patient taking differently: Take 4 mg by mouth daily with breakfast.) 120 tablet 2  . Hydrocortisone (PROCTOSOL HC RE) Place 1 Dose rectally daily as needed (hemorrhoids/itching).    . Insulin Glargine (LANTUS) 100 UNIT/ML Solostar Pen Inject 6 Units into the skin at bedtime.    . Insulin Pen Needle 29G X 12.7MM MISC BD Ultrafine Pen Needle Use as instructed to inject insulin daily 300 each  1  . Lancets (ONETOUCH DELICA PLUS [id number]) MISC USE AS INSTRUCT TO TEST BLOOD SUGAR ONCE DAILY 100 each 5  . levothyroxine (SYNTHROID) 175 MCG tablet Take 175 mcg by mouth daily before breakfast.    . meclizine (ANTIVERT) 12.5 MG tablet Take 1 tablet (12.5 mg total) by mouth 3 (three) times daily as needed for dizziness. 30 tablet 0  . Multiple Vitamin (MULTIVITAMIN WITH MINERALS) TABS tablet Take 1 tablet by mouth daily.    . Multiple Vitamins-Minerals (PRESERVISION AREDS 2 PO) Take by mouth.    . nitroGLYCERIN (NITROSTAT) 0.4 MG SL tablet Place 0.4 mg under the tongue every 5 (five) minutes as needed for chest pain.    . Omega-3 Fatty Acids (FISH OIL) 1200 MG CAPS Take 2,400 mg by mouth 2 (two) times daily.    Glory Rosebush VERIO test strip USE AS INSTRUCTED TO TEST BLOOD SUGAR ONCE DAILY 100 strip 5  . pantoprazole (PROTONIX) 20 MG tablet TAKE 1 TABLET BY MOUTH  DAILY (Patient taking differently: Take 20 mg by mouth daily.) 120 tablet 2  . Probiotic Product (ALIGN PO) Take 1 tablet by mouth daily.    . Psyllium (METAMUCIL PO) Take by mouth See admin instructions. Mix one heaping teaspoonful in apple juice and drink daily    . ranolazine (RANEXA) 1000 MG SR tablet Take 1 tablet (1,000 mg total) by mouth 2 (two) times daily. 180 tablet 3  . tamsulosin (FLOMAX) 0.4 MG CAPS capsule Take 1 capsule (0.4 mg total) by mouth daily after supper. 90 capsule 3  .  timolol (TIMOPTIC) 0.5 % ophthalmic solution Place 1 drop into both eyes daily.  11   No current facility-administered medications on file prior to visit.    BP 138/64   Pulse 68   Temp (!) 97.4 F (36.3 C) (Temporal)   Ht _0  (1.778 m)   Wt 155 lb (70.3 kg)   SpO2 98%   BMI 22.24 kg/m  Objective:   Physical Exam Cardiovascular:     Rate and Rhythm: Normal rate and regular rhythm.  Pulmonary:     Effort: Pulmonary effort is normal.     Breath sounds: Normal breath sounds. No wheezing or rales.  Musculoskeletal:     Cervical back: Neck supple.  Skin:    General: Skin is warm and dry.  Neurological:     Mental Status: He is alert and oriented to person, place, and time.           Assessment & Plan:      This visit occurred during the SARS-CoV-2 public health emergency.  Safety protocols were in place, including screening questions prior to the visit, additional usage of staff PPE, and extensive cleaning of exam room while observing appropriate contact time as indicated for disinfecting solutions.

## 2020-06-15 NOTE — Telephone Encounter (Signed)
Vm from [place] ([place], PT with [facility].  She's asking if pt's UTI has resolved and if it is related to the catheter.  Deneise Lever requests a call back at [phone].

## 2020-06-15 NOTE — Telephone Encounter (Signed)
We won't know until we recheck his urine. She can call back next week for results.

## 2020-06-15 NOTE — Telephone Encounter (Signed)
Has app today at 2 pm

## 2020-06-15 NOTE — Telephone Encounter (Signed)
Spoke to [facility] it was a coding/billing issue for home health but they got it answered. Will call if any further questions

## 2020-06-15 NOTE — Assessment & Plan Note (Addendum)
Recent hospital admission for UTI, suspected to be secondary to self catheterization.   Appears well today. Has not had to catheterize since hospital stay which is reassuring.   Checking UA today which was negative.   Hospital notes, labs, imaging reviewed.

## 2020-06-15 NOTE — Patient Instructions (Signed)
We will be in touch once we receive your urine test results.  Continue tamsulosin 0.4 mg daily for urine flow.  Use Unisom with caution.   It was a pleasure to see you today!

## 2020-06-21 DIAGNOSIS — Q6101 Congenital single renal cyst: Secondary | ICD-10-CM

## 2020-06-21 DIAGNOSIS — E039 Hypothyroidism, unspecified: Secondary | ICD-10-CM

## 2020-06-21 DIAGNOSIS — N319 Neuromuscular dysfunction of bladder, unspecified: Secondary | ICD-10-CM

## 2020-06-21 DIAGNOSIS — K5750 Diverticulosis of both small and large intestine without perforation or abscess without bleeding: Secondary | ICD-10-CM

## 2020-06-21 DIAGNOSIS — Z87440 Personal history of urinary (tract) infections: Secondary | ICD-10-CM

## 2020-06-21 DIAGNOSIS — N179 Acute kidney failure, unspecified: Secondary | ICD-10-CM

## 2020-06-21 DIAGNOSIS — Z9181 History of falling: Secondary | ICD-10-CM

## 2020-06-21 DIAGNOSIS — M4312 Spondylolisthesis, cervical region: Secondary | ICD-10-CM

## 2020-06-21 DIAGNOSIS — K649 Unspecified hemorrhoids: Secondary | ICD-10-CM

## 2020-06-21 DIAGNOSIS — M50321 Other cervical disc degeneration at C4-C5 level: Secondary | ICD-10-CM

## 2020-06-21 DIAGNOSIS — R911 Solitary pulmonary nodule: Secondary | ICD-10-CM

## 2020-06-21 DIAGNOSIS — Z7982 Long term (current) use of aspirin: Secondary | ICD-10-CM

## 2020-06-21 DIAGNOSIS — E871 Hypo-osmolality and hyponatremia: Secondary | ICD-10-CM

## 2020-06-21 DIAGNOSIS — Z794 Long term (current) use of insulin: Secondary | ICD-10-CM

## 2020-06-21 DIAGNOSIS — N1832 Chronic kidney disease, stage 3b: Secondary | ICD-10-CM

## 2020-06-21 DIAGNOSIS — N401 Enlarged prostate with lower urinary tract symptoms: Secondary | ICD-10-CM

## 2020-06-21 DIAGNOSIS — I700 Atherosclerosis of aorta: Secondary | ICD-10-CM

## 2020-06-21 DIAGNOSIS — D7582 Heparin induced thrombocytopenia (HIT): Secondary | ICD-10-CM

## 2020-06-21 DIAGNOSIS — Z86711 Personal history of pulmonary embolism: Secondary | ICD-10-CM

## 2020-06-21 DIAGNOSIS — Z95828 Presence of other vascular implants and grafts: Secondary | ICD-10-CM

## 2020-06-21 DIAGNOSIS — D631 Anemia in chronic kidney disease: Secondary | ICD-10-CM

## 2020-06-21 DIAGNOSIS — J9811 Atelectasis: Secondary | ICD-10-CM

## 2020-06-21 DIAGNOSIS — M50323 Other cervical disc degeneration at C6-C7 level: Secondary | ICD-10-CM

## 2020-06-21 DIAGNOSIS — Z951 Presence of aortocoronary bypass graft: Secondary | ICD-10-CM

## 2020-06-21 DIAGNOSIS — I5032 Chronic diastolic (congestive) heart failure: Secondary | ICD-10-CM

## 2020-06-21 DIAGNOSIS — I130 Hypertensive heart and chronic kidney disease with heart failure and stage 1 through stage 4 chronic kidney disease, or unspecified chronic kidney disease: Secondary | ICD-10-CM

## 2020-06-21 DIAGNOSIS — E1122 Type 2 diabetes mellitus with diabetic chronic kidney disease: Secondary | ICD-10-CM

## 2020-06-21 DIAGNOSIS — R159 Full incontinence of feces: Secondary | ICD-10-CM

## 2020-06-21 DIAGNOSIS — K219 Gastro-esophageal reflux disease without esophagitis: Secondary | ICD-10-CM

## 2020-06-21 DIAGNOSIS — I6782 Cerebral ischemia: Secondary | ICD-10-CM

## 2020-06-21 DIAGNOSIS — E785 Hyperlipidemia, unspecified: Secondary | ICD-10-CM

## 2020-06-21 DIAGNOSIS — K449 Diaphragmatic hernia without obstruction or gangrene: Secondary | ICD-10-CM

## 2020-06-21 DIAGNOSIS — I081 Rheumatic disorders of both mitral and tricuspid valves: Secondary | ICD-10-CM

## 2020-06-21 DIAGNOSIS — K8020 Calculus of gallbladder without cholecystitis without obstruction: Secondary | ICD-10-CM

## 2020-06-21 DIAGNOSIS — I2510 Atherosclerotic heart disease of native coronary artery without angina pectoris: Secondary | ICD-10-CM

## 2020-06-21 DIAGNOSIS — R338 Other retention of urine: Secondary | ICD-10-CM

## 2020-06-21 DIAGNOSIS — M4802 Spinal stenosis, cervical region: Secondary | ICD-10-CM

## 2020-06-21 DIAGNOSIS — Z79899 Other long term (current) drug therapy: Secondary | ICD-10-CM

## 2020-06-21 DIAGNOSIS — M47814 Spondylosis without myelopathy or radiculopathy, thoracic region: Secondary | ICD-10-CM

## 2020-06-21 DIAGNOSIS — Z87891 Personal history of nicotine dependence: Secondary | ICD-10-CM

## 2020-06-21 DIAGNOSIS — G319 Degenerative disease of nervous system, unspecified: Secondary | ICD-10-CM

## 2020-06-21 DIAGNOSIS — I714 Abdominal aortic aneurysm, without rupture: Secondary | ICD-10-CM

## 2020-06-21 DIAGNOSIS — Z7984 Long term (current) use of oral hypoglycemic drugs: Secondary | ICD-10-CM

## 2020-06-28 ENCOUNTER — Inpatient Hospital Stay: Payer: Medicare Other | Attending: Oncology

## 2020-06-28 DIAGNOSIS — N183 Chronic kidney disease, stage 3 unspecified: Secondary | ICD-10-CM | POA: Insufficient documentation

## 2020-06-28 DIAGNOSIS — Z79899 Other long term (current) drug therapy: Secondary | ICD-10-CM | POA: Insufficient documentation

## 2020-06-28 DIAGNOSIS — K219 Gastro-esophageal reflux disease without esophagitis: Secondary | ICD-10-CM | POA: Insufficient documentation

## 2020-06-28 DIAGNOSIS — I129 Hypertensive chronic kidney disease with stage 1 through stage 4 chronic kidney disease, or unspecified chronic kidney disease: Secondary | ICD-10-CM | POA: Insufficient documentation

## 2020-06-28 DIAGNOSIS — D631 Anemia in chronic kidney disease: Secondary | ICD-10-CM | POA: Insufficient documentation

## 2020-06-28 DIAGNOSIS — E538 Deficiency of other specified B group vitamins: Secondary | ICD-10-CM | POA: Insufficient documentation

## 2020-06-28 DIAGNOSIS — D7589 Other specified diseases of blood and blood-forming organs: Secondary | ICD-10-CM | POA: Insufficient documentation

## 2020-06-29 ENCOUNTER — Ambulatory Visit: Payer: Medicare Other | Admitting: Urology

## 2020-06-30 ENCOUNTER — Inpatient Hospital Stay: Payer: Medicare Other

## 2020-06-30 ENCOUNTER — Other Ambulatory Visit: Payer: Self-pay

## 2020-06-30 ENCOUNTER — Encounter: Payer: Self-pay | Admitting: Oncology

## 2020-06-30 ENCOUNTER — Inpatient Hospital Stay: Payer: Medicare Other | Admitting: Oncology

## 2020-06-30 VITALS — BP 150/57 | HR 62 | Temp 97.0°F | Resp 18 | Wt 153.2 lb

## 2020-06-30 DIAGNOSIS — N183 Chronic kidney disease, stage 3 unspecified: Secondary | ICD-10-CM

## 2020-06-30 DIAGNOSIS — E538 Deficiency of other specified B group vitamins: Secondary | ICD-10-CM | POA: Diagnosis not present

## 2020-06-30 DIAGNOSIS — D631 Anemia in chronic kidney disease: Secondary | ICD-10-CM

## 2020-06-30 DIAGNOSIS — K219 Gastro-esophageal reflux disease without esophagitis: Secondary | ICD-10-CM | POA: Diagnosis not present

## 2020-06-30 DIAGNOSIS — I129 Hypertensive chronic kidney disease with stage 1 through stage 4 chronic kidney disease, or unspecified chronic kidney disease: Secondary | ICD-10-CM | POA: Diagnosis present

## 2020-06-30 DIAGNOSIS — D7589 Other specified diseases of blood and blood-forming organs: Secondary | ICD-10-CM | POA: Diagnosis not present

## 2020-06-30 DIAGNOSIS — Z79899 Other long term (current) drug therapy: Secondary | ICD-10-CM | POA: Diagnosis not present

## 2020-06-30 LAB — VITAMIN B12: Vitamin B-12: 632 pg/mL (ref 180–914)

## 2020-06-30 LAB — CBC WITH DIFFERENTIAL/PLATELET
Abs Immature Granulocytes: 0.01 10*3/uL (ref 0.00–0.07)
Basophils Absolute: 0 10*3/uL (ref 0.0–0.1)
Basophils Relative: 1 %
Eosinophils Absolute: 0.2 10*3/uL (ref 0.0–0.5)
Eosinophils Relative: 5 %
HCT: 29.4 % — ABNORMAL LOW (ref 39.0–52.0)
Hemoglobin: 9.7 g/dL — ABNORMAL LOW (ref 13.0–17.0)
Immature Granulocytes: 0 %
Lymphocytes Relative: 24 %
Lymphs Abs: 1.1 10*3/uL (ref 0.7–4.0)
MCH: 34.4 pg — ABNORMAL HIGH (ref 26.0–34.0)
MCHC: 33 g/dL (ref 30.0–36.0)
MCV: 104.3 fL — ABNORMAL HIGH (ref 80.0–100.0)
Monocytes Absolute: 0.5 10*3/uL (ref 0.1–1.0)
Monocytes Relative: 11 %
Neutro Abs: 2.8 10*3/uL (ref 1.7–7.7)
Neutrophils Relative %: 59 %
Platelets: 164 10*3/uL (ref 150–400)
RBC: 2.82 MIL/uL — ABNORMAL LOW (ref 4.22–5.81)
RDW: 13.1 % (ref 11.5–15.5)
WBC: 4.8 10*3/uL (ref 4.0–10.5)
nRBC: 0 % (ref 0.0–0.2)

## 2020-06-30 LAB — IRON AND TIBC
Iron: 86 ug/dL (ref 45–182)
Saturation Ratios: 30 % (ref 17.9–39.5)
TIBC: 284 ug/dL (ref 250–450)
UIBC: 198 ug/dL

## 2020-06-30 LAB — FOLATE: Folate: 43 ng/mL (ref >5.9)

## 2020-06-30 LAB — FERRITIN: Ferritin: 792 ng/mL — ABNORMAL HIGH (ref 24–336)

## 2020-06-30 MED ORDER — EPOETIN ALFA-EPBX 10000 UNIT/ML IJ SOLN
20000.0000 [IU] | Freq: Once | INTRAMUSCULAR | Status: AC
  Administered 2020-06-30: 20000 [IU] via SUBCUTANEOUS
  Filled 2020-06-30: qty 2

## 2020-06-30 MED ORDER — NITROGLYCERIN 0.4 MG SL SUBL: 0.4000 mg | SUBLINGUAL_TABLET | SUBLINGUAL | 0 refills | Status: DC | PRN

## 2020-06-30 NOTE — Progress Notes (Signed)
Hematology/Oncology follow up note [facility] [phone] [phone]   Patient Care Team: Pleas Koch, NP as PCP - General (Internal Medicine) End, Harrell Gave, MD as PCP - Cardiology (Cardiology) Vickie Epley, MD as PCP - Electrophysiology (Cardiology) Dingeldein, Remo Lipps, MD as Referring Physician (Ophthalmology) End, Harrell Gave, MD as Consulting Physician (Cardiology) Earlie Server, MD as Consulting Physician (Oncology)  REFERRING PROVIDER: Dr.Lateef REASON FOR VISIT Follow up for treatment of anemia of CKD, on erythropoietin replacement therapy.  HISTORY OF PRESENTING ILLNESS:  Tommy [name] is a  87 y.o.  male with PMH listed below who was referred to me for evaluation of anemia.  Patient recently removed from [place] to [place] to live with his son.  He has chronic kidney disease and anemia associated with CKD.  He reports that he has been getting Procrit 10,000 units every 2 weeks for many years.  Denies any recent thrombosis events.  Patient reports that at the last Procrit shot he received was back in September 2018 before he moved here. He establish care with Dr. Holley Raring for his CKD and he was referred to me for evaluation of anemia and possible Procrit shots.  Patient had lab work done at [facility] on 05/15/2017 hemoglobin 9.6, MCV 98, WBC 4.5, platelet count 1 70,000, normal differential use.   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 He has IVC filter in place.   INTERVAL HISTORY Tommy [name] is a 87 y.o. male who has above history reviewed by me today presents for follow-up for anemia due to CKD. Patient has been on erythropoietin therapy monthly if hemoglobin is less than 10.  Recent hospitalization due to UTI.  He missed 1 Retacrit treatment. Today he reports feeling okay.  No new complaints.  Chronic fatigue, same.   Review  of Systems  Constitutional: Positive for malaise/fatigue. Negative for chills, fever and weight loss.  HENT: Negative for sore throat.   Eyes: Negative for redness.  Respiratory: Negative for cough, shortness of breath and wheezing.   Cardiovascular: Negative for chest pain, palpitations and leg swelling.  Gastrointestinal: Negative for abdominal pain, blood in stool, nausea and vomiting.  Genitourinary: Negative for dysuria.  Musculoskeletal: Negative for myalgias.  Skin: Negative for rash.  Neurological: Negative for dizziness, tingling and tremors.  Endo/Heme/Allergies: Does not bruise/bleed easily.  Psychiatric/Behavioral: Negative for hallucinations.    MEDICAL HISTORY:  Past Medical History:  Diagnosis Date  . Anemia   . Carotid artery occlusion    a. 2004 s/p L carotid endarterectomy; b. 02/2019 U/S: 1-39% bilat ICA stenosis. <50% bilat CCA stenosis; c. 02/2020 U/S: 50-69% RICA, s/p L CEA w/o hemodynamically signif narrowing.  . CKD (chronic kidney disease) stage 3, GFR 30-59 ml/min (HCC)   . Coronary artery disease    a. 1994 s/p CABG x 3, [place], [place] (LIMA->LAD, VG->dRCA, VG->unknown vessel); b. 02/2016 Cath: LM 99d, LAD 100ost, LCX 100ost, RCA 80ost, diffuse 85-44m. LIMA->LAD ok, VG->RCA ok. VG->unknown vessel 100.  . Diabetes mellitus without complication ([place])   . Diastolic dysfunction    a. 02/2017 Echo: EF 55-65%, no rwma. Nl DD. Ao sclerosis w/o stenosis. Mod TR. Mild MR; b. 02/2020 Echo: EF 60-65%, no rwma, Gr2 DD, nl RV size/fxn.   Marland Kitchen Dyslipidemia   . GERD (gastroesophageal reflux disease)   . Hemorrhoids   . 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 Hypertension   . Incontinence  of feces 10/28/2019  . Pulmonary embolism ([place])   . Thyroid disease     SURGICAL HISTORY: Past Surgical History:  Procedure Laterality Date  . CAROTID ENDARTERECTOMY Left 2004  . CORONARY ARTERY BYPASS GRAFT  1994   [name]  . TONSILLECTOMY AND ADENOIDECTOMY       SOCIAL HISTORY: Social History   Socioeconomic History  . Marital status: Widowed    Spouse name: Not on file  . Number of children: 4  . Years of education: 16  . Highest education level: Bachelor's degree (e.g., BA, AB, BS)  Occupational History  . Occupation: retired  Tobacco Use  . Smoking status: Former Smoker    Packs/day: 1.50    Years: 25.00    Pack years: 37.50    Types: Cigarettes    Quit date: 1978    Years since quitting: 44.3  . Smokeless tobacco: Never Used  Vaping Use  . Vaping Use: Never used  Substance and Sexual Activity  . Alcohol use: Yes    Alcohol/week: 1.0 standard drink    Types: 1 Glasses of wine per week    Comment: socially   . Drug use: No  . Sexual activity: Not Currently  Other Topics Concern  . Not on file  Social History Narrative   ** Merged History Encounter **       Widower. 4 children, 7 grandchildren, 1 great grandchild. 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    Social Determinants of Health   Financial Resource Strain: Not on file  Food Insecurity: Not on file  Transportation Needs: Not on file  Physical Activity: Not on file  Stress: Not on file  Social Connections: Not on file  Intimate Partner Violence: Not on file    FAMILY HISTORY: Family History  Problem Relation Age of Onset  . Heart attack Mother   . Heart disease Mother   . Stroke Mother   . Diabetes Father   . Coronary artery disease Sister     ALLERGIES:  is allergic to heparin, patiromer, and sodium zirconium cyclosilicate.  MEDICATIONS:  Current Outpatient Medications  Medication Sig Dispense Refill  . aspirin EC 81 MG tablet Take 81 mg by mouth at bedtime. Swallow whole.    Marland Kitchen atorvastatin (LIPITOR) 40 MG tablet TAKE 1 TABLET BY MOUTH  DAILY (Patient taking differently: Take 40 mg by mouth at bedtime.) 90 tablet 3  . B-D ULTRAFINE III SHORT PEN 31G X 8 MM MISC USE AS INSTRUCTED TO INJECT INSULIN  DAILY. 100 each 5  . betamethasone valerate (VALISONE) 0.1 % cream APPLY TO AFFECTED AREA TWICE A DAY (Patient taking differently: Apply 1 application topically daily.) 30 g 0  . Blood Glucose Monitoring Suppl (ONETOUCH VERIO) w/Device KIT 1 Device by Does not apply route daily. Use as instructed to test blood sugar daily 1 kit 0  . calcium carbonate (OSCAL) 1500 (600 Ca) MG TABS tablet Take 600 mg of elemental calcium by mouth at bedtime.    . cholecalciferol (VITAMIN D3) 25 MCG (1000 UT) tablet Take 1,000 Units by mouth at bedtime.    . diclofenac Sodium (VOLTAREN) 1 % GEL Apply 1 application topically daily as needed (pain).    Marland Kitchen epoetin alfa (EPOGEN,PROCRIT) 11572 UNIT/ML injection Inject 20,000 Units into the skin See admin instructions. Inject 20000 units subcutaneously  if hemoglobin <10 - checked monthly    . ferrous sulfate 325 (65 FE) MG tablet Take 325-650 mg by mouth  See admin instructions. Take 2 tablets (650 mg) by mouth every morning and 1 tablet (325 mg) at night    . glimepiride (AMARYL) 4 MG tablet TAKE 1 TABLET BY MOUTH  DAILY WITH BREAKFAST (Patient taking differently: Take 4 mg by mouth daily with breakfast.) 120 tablet 2  . Hydrocortisone (PROCTOSOL HC RE) Place 1 Dose rectally daily as needed (hemorrhoids/itching).    . Insulin Glargine (LANTUS) 100 UNIT/ML Solostar Pen Inject 6 Units into the skin at bedtime.    . Insulin Pen Needle 29G X 12.7MM MISC BD Ultrafine Pen Needle Use as instructed to inject insulin daily 300 each 1  . Lancets (ONETOUCH DELICA PLUS LANCET33G) MISC USE AS INSTRUCT TO TEST BLOOD SUGAR ONCE DAILY 100 each 5  . levothyroxine (SYNTHROID) 175 MCG tablet Take 175 mcg by mouth daily before breakfast.    . meclizine (ANTIVERT) 12.5 MG tablet Take 1 tablet (12.5 mg total) by mouth 3 (three) times daily as needed for dizziness. 30 tablet 0  . Multiple Vitamin (MULTIVITAMIN WITH MINERALS) TABS tablet Take 1 tablet by mouth daily.    . Multiple Vitamins-Minerals  (PRESERVISION AREDS 2 PO) Take by mouth.    . Omega-3 Fatty Acids (FISH OIL) 1200 MG CAPS Take 2,400 mg by mouth 2 (two) times daily.    Letta Pate VERIO test strip USE AS INSTRUCTED TO TEST BLOOD SUGAR ONCE DAILY 100 strip 5  . pantoprazole (PROTONIX) 20 MG tablet TAKE 1 TABLET BY MOUTH  DAILY (Patient taking differently: Take 20 mg by mouth daily.) 120 tablet 2  . Probiotic Product (ALIGN PO) Take 1 tablet by mouth daily.    . Psyllium (METAMUCIL PO) Take by mouth See admin instructions. Mix one heaping teaspoonful in apple juice and drink daily    . ranolazine (RANEXA) 1000 MG SR tablet Take 1 tablet (1,000 mg total) by mouth 2 (two) times daily. 180 tablet 3  . tamsulosin (FLOMAX) 0.4 MG CAPS capsule Take 1 capsule (0.4 mg total) by mouth daily after supper. 90 capsule 3  . timolol (TIMOPTIC) 0.5 % ophthalmic solution Place 1 drop into both eyes daily.  11  . nitroGLYCERIN (NITROSTAT) 0.4 MG SL tablet Place 1 tablet (0.4 mg total) under the tongue every 5 (five) minutes as needed for chest pain. (Patient not taking: Reported on 06/30/2020) 25 tablet 0   No current facility-administered medications for this visit.     PHYSICAL EXAMINATION: ECOG PERFORMANCE STATUS: 1 - Symptomatic but completely ambulatory Vitals:   06/30/20 1319  BP: (!) 150/57  Pulse: 62  Resp: 18  Temp: (!) 97 F (36.1 C)   Filed Weights   06/30/20 1319  Weight: 153 lb 3.2 oz (69.5 kg)    Physical Exam Constitutional:      General: He is not in acute distress.    Appearance: He is not diaphoretic.     Comments: Patient walks independently  HENT:     Head: Normocephalic and atraumatic.     Nose: Nose normal.     Mouth/Throat:     Pharynx: No oropharyngeal exudate.  Eyes:     General: No scleral icterus.    Pupils: Pupils are equal, round, and reactive to light.  Cardiovascular:     Rate and Rhythm: Normal rate and regular rhythm.     Heart sounds: No murmur heard.   Pulmonary:     Effort: Pulmonary  effort is normal. No respiratory distress.     Breath sounds: No rales.  Chest:  Chest wall: No tenderness.  Abdominal:     General: There is no distension.     Palpations: Abdomen is soft.     Tenderness: There is no abdominal tenderness.  Musculoskeletal:        General: Normal range of motion.     Cervical back: Normal range of motion and neck supple.  Skin:    General: Skin is warm and dry.     Findings: No erythema.  Neurological:     Mental Status: He is alert and oriented to person, place, and time.     Cranial Nerves: No cranial nerve deficit.     Motor: No abnormal muscle tone.     Coordination: Coordination normal.  Psychiatric:        Mood and Affect: Affect normal.      LABORATORY DATA:  I have reviewed the data as listed Lab Results  Component Value Date   WBC 4.8 06/30/2020   HGB 9.7 (L) 06/30/2020   HCT 29.4 (L) 06/30/2020   MCV 104.3 (H) 06/30/2020   PLT 164 06/30/2020   Recent Labs    02/13/20 1557 02/14/20 0422 06/02/20 1248 06/03/20 0102 06/04/20 0027 06/05/20 0404  NA 134*   < > 131* 126* 131* 135  K 5.1   < > 4.8 4.6 4.3 4.3  CL 97*   < > 97* 96* 99 102  CO2 29   < > 24 21* 25 24  GLUCOSE 334*   < > 161* 263* 217* 143*  BUN 48*   < > 45* 41* 35* 35*  CREATININE 2.26*   < > 1.92* 1.80* 1.64* 1.75*  CALCIUM 9.7   < > 9.2 8.7* 8.7* 9.0  GFRNONAA 27*   < > 32* 35* 39* 36*  PROT 7.6  --  7.2  --   --   --   ALBUMIN 3.6  --  2.5*  --   --   --   AST 29  --  54*  --   --   --   ALT 23  --  42  --   --   --   ALKPHOS 76  --  97  --   --   --   BILITOT 0.8  --  0.6  --   --   --   BILIDIR <0.1  --   --   --   --   --   IBILI NOT CALCULATED  --   --   --   --   --    < > = values in this interval not displayed.     Iron/TIBC/Ferritin/ %Sat    Component Value Date/Time   IRON 86 06/30/2020 1305   TIBC 284 06/30/2020 1305   FERRITIN 792 (H) 06/30/2020 1305   IRONPCTSAT 30 06/30/2020 1305   SPEP negative for M spike.   RADIOGRAPHIC  STUDIES: I have personally reviewed the radiological images as listed and agreed with the findings in the report. 01/19/2018 CXR FINDINGS: The lungs are clear and negative for focal airspace consolidation, pulmonary edema or suspicious pulmonary nodule. No pleural effusion or pneumothorax. Cardiac and mediastinal contours are within normal limits. Patient is status post median sternotomy with evidence of prior CABG including LIMA bypass. Trace linear scarring versus atelectasis in the lingula. No acute fracture or lytic or blastic osseous lesions. The visualized upper abdominal bowel gas pattern is unremarkable.  IMPRESSION: No active cardiopulmonary disease. ASSESSMENT & PLAN:  1. Anemia of chronic renal  failure, stage 3 (moderate), unspecified whether stage 3a or 3b CKD ([place])   2. Macrocytosis    #Chronic anemia secondary to chronic kidney disease. Labs reviewed and discussed with patient Hemoglobin is less than 10.  Proceed with Retacrit today . Macrocytosis, normal folate level and vitamin B12 level. MCV has worsened.  Continue monitor.  Probably due to reticulocytosis, or ? Early onset MDS. Monitor for now.  Follow-up in 4 weeks, 8 weeks 12 weeks for H&H +/- Retacrit if hemoglobin is less than 10.  Follow-up with me in 4 months. All questions were answered. The patient knows to call the clinic with any problems questions or concerns. Orders Placed This Encounter  Procedures  . CBC with Differential/Platelet    Standing Status:   Future    Standing Expiration Date:   06/30/2021  . Ferritin    Standing Status:   Future    Standing Expiration Date:   06/30/2021  . Folate    Standing Status:   Future    Standing Expiration Date:   06/30/2021  . Iron and TIBC    Standing Status:   Future    Standing Expiration Date:   06/30/2021  . Vitamin B12    Standing Status:   Future    Standing Expiration Date:   06/30/2021  . Hemoglobin and hematocrit, blood    Standing Status:   Standing     Number of Occurrences:   3    Standing Expiration Date:   06/30/2021    Earlie Server, MD, PhD  06/30/2020

## 2020-06-30 NOTE — Progress Notes (Signed)
Pt here for follow up. No new concerns voiced.   

## 2020-07-15 ENCOUNTER — Other Ambulatory Visit: Payer: Self-pay | Admitting: Primary Care

## 2020-07-15 DIAGNOSIS — I8393 Asymptomatic varicose veins of bilateral lower extremities: Secondary | ICD-10-CM

## 2020-07-17 ENCOUNTER — Encounter: Payer: Self-pay | Admitting: Urology

## 2020-07-17 ENCOUNTER — Other Ambulatory Visit: Payer: Self-pay

## 2020-07-17 ENCOUNTER — Ambulatory Visit: Payer: Medicare Other | Admitting: Urology

## 2020-07-17 VITALS — BP 147/63 | HR 74 | Ht 70.0 in | Wt 155.0 lb

## 2020-07-17 DIAGNOSIS — N401 Enlarged prostate with lower urinary tract symptoms: Secondary | ICD-10-CM

## 2020-07-17 DIAGNOSIS — R339 Retention of urine, unspecified: Secondary | ICD-10-CM | POA: Diagnosis not present

## 2020-07-17 DIAGNOSIS — R3914 Feeling of incomplete bladder emptying: Secondary | ICD-10-CM | POA: Diagnosis not present

## 2020-07-17 LAB — BLADDER SCAN AMB NON-IMAGING: Scan Result: 312

## 2020-07-17 NOTE — Progress Notes (Signed)
07/17/2020 3:42 PM   Tommy [name] 1926/06/12 [id number]  Referring provider: Pleas Koch, NP [place],  [place] [address]  Chief Complaint  Patient presents with  . Urinary Retention    HPI: 87 y.o. male presents for follow-up of urinary retention.   Refer to my previous note 05/21/2020  His follow-up residual was ~ 300 mL and was started on CIC by S.  McGowan  Was admitted to [facility] 06/02/20 with UTI and discharged 4/4  Stopped CIC after discharge  States he has been doing well and voiding at baseline  Taking tamsulosin without side effects   PMH: Past Medical History:  Diagnosis Date  . Anemia   . Carotid artery occlusion    a. 2004 s/p L carotid endarterectomy; b. 02/2019 U/S: 1-39% bilat ICA stenosis. <50% bilat CCA stenosis; c. 02/2020 U/S: 50-69% RICA, s/p L CEA w/o hemodynamically signif narrowing.  . CKD (chronic kidney disease) stage 3, GFR 30-59 ml/min (HCC)   . Coronary artery disease    a. 1994 s/p CABG x 3, [place], [place] (LIMA->LAD, VG->dRCA, VG->unknown vessel); b. 02/2016 Cath: LM 99d, LAD 100ost, LCX 100ost, RCA 80ost, diffuse 85-46m. LIMA->LAD ok, VG->RCA ok. VG->unknown vessel 100.  . Diabetes mellitus without complication ([place])   . Diastolic dysfunction    a. 02/2017 Echo: EF 55-65%, no rwma. Nl DD. Ao sclerosis w/o stenosis. Mod TR. Mild MR; b. 02/2020 Echo: EF 60-65%, no rwma, Gr2 DD, nl RV size/fxn.   Marland Kitchen Dyslipidemia   . GERD (gastroesophageal reflux disease)   . Hemorrhoids   . 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 Hypertension   . Incontinence of feces 10/28/2019  . Pulmonary embolism ([place])   . Thyroid disease     Surgical History: Past Surgical History:  Procedure Laterality Date  . CAROTID ENDARTERECTOMY Left 2004  . CORONARY ARTERY BYPASS GRAFT  1994   [name]  . TONSILLECTOMY AND ADENOIDECTOMY      Home Medications:  Allergies as of 07/17/2020      Reactions   Heparin Other (See  Comments)   "it caused low platelet count and made him have a clot"   Patiromer Other (See Comments)   Unknown reaction   Sodium Zirconium Cyclosilicate Other (See Comments)   Unknown reaction      Medication List       Accurate as of Jul 17, 2020  3:42 PM. If you have any questions, ask your nurse or doctor.        ALIGN PO Take 1 tablet by mouth daily.   aspirin EC 81 MG tablet Take 81 mg by mouth at bedtime. Swallow whole.   atorvastatin 40 MG tablet Commonly known as: LIPITOR TAKE 1 TABLET BY MOUTH  DAILY What changed: when to take this   betamethasone valerate 0.1 % cream Commonly known as: VALISONE APPLY TO AFFECTED AREA TWICE A DAY What changed: See the new instructions.   calcium carbonate 1500 (600 Ca) MG Tabs tablet Commonly known as: OSCAL Take 600 mg of elemental calcium by mouth at bedtime.   cholecalciferol 25 MCG (1000 UNIT) tablet Commonly known as: VITAMIN D3 Take 1,000 Units by mouth at bedtime.   diclofenac Sodium 1 % Gel Commonly known as: VOLTAREN Apply 1 application topically daily as needed (pain).   epoetin alfa 10000 UNIT/ML injection Commonly known as: EPOGEN Inject 20,000 Units into the skin See admin instructions. Inject 20000 units subcutaneously  if hemoglobin <10 - checked monthly  ferrous sulfate 325 (65 FE) MG tablet Take 325-650 mg by mouth See admin instructions. Take 2 tablets (650 mg) by mouth every morning and 1 tablet (325 mg) at night   Fish Oil 1200 MG Caps Take 2,400 mg by mouth 2 (two) times daily.   glimepiride 4 MG tablet Commonly known as: AMARYL TAKE 1 TABLET BY MOUTH  DAILY WITH BREAKFAST   insulin glargine 100 UNIT/ML Solostar Pen Commonly known as: LANTUS Inject 6 Units into the skin at bedtime.   Insulin Pen Needle 29G X 12.7MM Misc BD Ultrafine Pen Needle Use as instructed to inject insulin daily   B-D ULTRAFINE III SHORT PEN 31G X 8 MM Misc Generic drug: Insulin Pen Needle USE AS INSTRUCTED TO  INJECT INSULIN DAILY.   levothyroxine 175 MCG tablet Commonly known as: SYNTHROID Take 175 mcg by mouth daily before breakfast.   meclizine 12.5 MG tablet Commonly known as: ANTIVERT Take 1 tablet (12.5 mg total) by mouth 3 (three) times daily as needed for dizziness.   METAMUCIL PO Take by mouth See admin instructions. Mix one heaping teaspoonful in apple juice and drink daily   multivitamin with minerals Tabs tablet Take 1 tablet by mouth daily.   nitroGLYCERIN 0.4 MG SL tablet Commonly known as: NITROSTAT Place 1 tablet (0.4 mg total) under the tongue every 5 (five) minutes as needed for chest pain.   OneTouch Delica Plus [id number] Misc USE AS INSTRUCT TO TEST BLOOD SUGAR ONCE DAILY   OneTouch Verio test strip Generic drug: glucose blood USE AS INSTRUCTED TO TEST BLOOD SUGAR ONCE DAILY   OneTouch Verio w/Device Kit 1 Device by Does not apply route daily. Use as instructed to test blood sugar daily   pantoprazole 20 MG tablet Commonly known as: PROTONIX TAKE 1 TABLET BY MOUTH  DAILY   PRESERVISION AREDS 2 PO Take by mouth.   PROCTOSOL HC RE Place 1 Dose rectally daily as needed (hemorrhoids/itching).   ranolazine 1000 MG SR tablet Commonly known as: RANEXA Take 1 tablet (1,000 mg total) by mouth 2 (two) times daily.   tamsulosin 0.4 MG Caps capsule Commonly known as: FLOMAX Take 1 capsule (0.4 mg total) by mouth daily after supper.   timolol 0.5 % ophthalmic solution Commonly known as: TIMOPTIC Place 1 drop into both eyes daily.       Allergies:  Allergies  Allergen Reactions  . Heparin Other (See Comments)    "it caused low platelet count and made him have a clot"  . Patiromer Other (See Comments)    Unknown reaction  . Sodium Zirconium Cyclosilicate Other (See Comments)    Unknown reaction    Family History: Family History  Problem Relation Age of Onset  . Heart attack Mother   . Heart disease Mother   . Stroke Mother   . Diabetes Father    . Coronary artery disease Sister     Social History:  reports that he quit smoking about 44 years ago. His smoking use included cigarettes. He has a 37.50 pack-year smoking history. He has never used smokeless tobacco. He reports current alcohol use of about 1.0 standard drink of alcohol per week. He reports that he does not use drugs.   Physical Exam: BP (!) 147/63   Pulse 74   Ht $R'5\' 10"'aB$  (1.778 m)   Wt 155 lb (70.3 kg)   BMI 22.24 kg/m   Constitutional:  Alert and oriented, No acute distress. HEENT: [place] AT, moist mucus membranes.  Trachea midline, no  masses. Cardiovascular: No clubbing, cyanosis, or edema. Respiratory: Normal respiratory effort, no increased work of breathing. GI: Abdomen is soft, nontender, nondistended, no abdominal masses GU: Prostate 60 g, smooth without nodules   Assessment & Plan:    1.  BPH with incomplete bladder emptying  Bladder scan PVR today 312 mL  This may be his baseline and not pathologic  He is asymptomatic  Check creatinine today and if stable will follow his residual then recommend a PA follow-up 3 months for bladder scan   Abbie Sons, MD  [facility] [address] [place], [place] [address] [phone]

## 2020-07-18 LAB — CREATININE, SERUM
Creatinine, Ser: 1.65 mg/dL — ABNORMAL HIGH (ref 0.76–1.27)
eGFR: 39 mL/min/{1.73_m2} — ABNORMAL LOW (ref >59)

## 2020-07-19 ENCOUNTER — Encounter: Payer: Self-pay | Admitting: Urology

## 2020-07-20 ENCOUNTER — Other Ambulatory Visit: Payer: Self-pay

## 2020-07-20 ENCOUNTER — Encounter: Payer: Self-pay | Admitting: Nurse Practitioner

## 2020-07-20 ENCOUNTER — Ambulatory Visit: Payer: Medicare Other | Admitting: Nurse Practitioner

## 2020-07-20 VITALS — BP 140/58 | HR 64 | Ht 70.0 in | Wt 156.0 lb

## 2020-07-20 DIAGNOSIS — I10 Essential (primary) hypertension: Secondary | ICD-10-CM | POA: Diagnosis not present

## 2020-07-20 DIAGNOSIS — I714 Abdominal aortic aneurysm, without rupture, unspecified: Secondary | ICD-10-CM

## 2020-07-20 DIAGNOSIS — I25118 Atherosclerotic heart disease of native coronary artery with other forms of angina pectoris: Secondary | ICD-10-CM | POA: Diagnosis not present

## 2020-07-20 DIAGNOSIS — E785 Hyperlipidemia, unspecified: Secondary | ICD-10-CM | POA: Diagnosis not present

## 2020-07-20 DIAGNOSIS — I452 Bifascicular block: Secondary | ICD-10-CM | POA: Diagnosis not present

## 2020-07-20 NOTE — Patient Instructions (Signed)
Medication Instructions:  ?No changes at this time. ? ?*If you need a refill on your cardiac medications before your next appointment, please call your pharmacy* ? ? ?Lab Work: ?None ? ?If you have labs (blood work) drawn today and your tests are completely normal, you will receive your results only by: ?MyChart Message (if you have MyChart) OR ?A paper copy in the mail ?If you have any lab test that is abnormal or we need to change your treatment, we will call you to review the results. ? ? ?Testing/Procedures: ?None ? ? ?Follow-Up: ?At [facility], you and your health needs are our priority.  As part of our continuing mission to provide you with exceptional heart care, we have created designated Provider Care Teams.  These Care Teams include your primary Cardiologist (physician) and Advanced Practice Providers (APPs -  Physician Assistants and Nurse Practitioners) who all work together to provide you with the care you need, when you need it. ? ? ?Your next appointment:   ?4 month(s) ? ?The format for your next appointment:   ?In Person ? ?Provider:   ?Christopher End, MD  ?

## 2020-07-20 NOTE — Progress Notes (Signed)
Office Visit    Patient Name: Tommy [name] Date of Encounter: 07/20/2020  Primary Care Provider:  Pleas Koch, NP Primary Cardiologist:  Nelva Bush, MD  Chief Complaint    87 year old male with a history of peripheral arterial disease, hypertension, coronary artery disease status post remote CABG, carotid arterial disease status post left carotid endarterectomy, hyperlipidemia, stage IIIb chronic kidney disease, type 2 diabetes mellitus, chronic anemia, grade 1 diastolic dysfunction, aortic valve sclerosis, moderate pulmonary hypertension, bifascicular block, and mild abdominal aortic dilation, who presents for follow-up related to CAD.  Past Medical History    Past Medical History:  Diagnosis Date  . AAA (abdominal aortic aneurysm) ([place])    a. 06/2019 Duplex: 3.4cm.  Marland Kitchen Anemia   . Carotid artery occlusion    a. 2004 s/p L carotid endarterectomy; b. 02/2019 U/S: 1-39% bilat ICA stenosis. <50% bilat CCA stenosis; c. 02/2020 U/S: 50-69% RICA, s/p L CEA w/o hemodynamically signif narrowing.  . CKD (chronic kidney disease) stage 3, GFR 30-59 ml/min (HCC)   . Coronary artery disease    a. 1994 s/p CABG x 3, [place], [place] (LIMA->LAD, VG->dRCA, VG->unknown vessel); b. 02/2016 Cath: LM 99d, LAD 100ost, LCX 100ost, RCA 80ost, diffuse 85-50m LIMA->LAD ok, VG->RCA ok. VG->unknown vessel 100.  . Diabetes mellitus without complication ([place]   . Diastolic dysfunction    a. 02/2017 Echo: EF 55-65%, no rwma. Nl DD. Ao sclerosis w/o stenosis. Mod TR. Mild MR; b. 02/2020 Echo: EF 60-65%, no rwma, Gr2 DD, nl RV size/fxn.   .Marland KitchenDyslipidemia   . GERD (gastroesophageal reflux disease)   . Hemorrhoids   . 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 Hypertension   . Incontinence of feces 10/28/2019  . Pre-syncope & Palpitations    a. 08/2019 Zio: RSR, avg HR 59 (44-86). Rare PACs/PVCs. 2 atrial runs (max 8 beats). Triggered events = sinusr rhythm w/ PVCs; b. 03/2020 Zio: RSR, avg HR 63  (35-115). Rare PACs/PVCs. NSVT x 2 (up to 5 beats/154 bpm). 13 atrial runs (max 15 beats; 141 bpm). Brief episodes of sinus brady and jxnl rhythm (min HR 31). Triggered events = RSR, PACs.  . [place]   . Thyroid disease    Past Surgical History:  Procedure Laterality Date  . CAROTID ENDARTERECTOMY Left 2004  . CORONARY ARTERY BYPASS GRAFT  1994   [name]  . TONSILLECTOMY AND ADENOIDECTOMY      Allergies  Allergies  Allergen Reactions  . Heparin Other (See Comments)    "it caused low platelet count and made him have a clot"  . Patiromer Other (See Comments)    Unknown reaction  . Sodium Zirconium Cyclosilicate Other (See Comments)    Unknown reaction    History of Present Illness    87year old male with the above past medical history including peripheral arterial disease, hypertension, coronary artery disease status post remote CABG ([place], 1994), carotid arterial disease status post left CEA, hyperlipidemia, bifascicular block with first-degree AV block, orthostatic lightheadedness, stage III chronic kidney disease, type 2 diabetes mellitus, chronic anemia, grade 1 diastolic dysfunction, aortic valve sclerosis, moderate pulmonary hypertension, and mild abdominal aortic dilation.  Most recent diagnostic catheterization was performed in December 2017 showing native, severe multivessel disease with 2 of 3 patent grafts.  He has chronic, stable angina which typically occurs at night and has been controlled with ranolazine therapy.  He was previously on beta-blocker therapy but this was discontinued in the setting of bradycardia and soft blood  pressures.  In the setting of lightheaded episodes, he wore a zio monitor in January 2022, which showed sinus rhythm w/ avg HR of 63. Rare PACs/PVCs were noted.  He had up to 5 beats of NSVT (2 episodes), and 13 runs of SVT w/ a max HR of 141, and longest episode of 15 beats.  Min HR was 31 w/ sinus brady and jxnl rhythm  noted.  Slowest heart rates were noted in the afternoon, at which time, patient reported that he was likely napping, as he often takes afternoon naps.  Following his last visit in February, he was admitted in April with complaints of weakness which resulted in a fall off of the toilet prompting presentation.  His urinalysis was abnormal and he was placed on ceftriaxone.  He was seen by PT and was discharged with home health PT. he has completed PT and feels like it helped.  He returned to the gym yesterday was able to exercise and do light weight training without symptoms or limitations.  He continues to have episodic nocturnal chest discomfort occurring about once a month, lasting just a few minutes, resolving almost immediately after taking sublingual nitroglycerin.  Overall, he feels like his chest pain episodes are less frequent than they were prior to increasing ranolazine dosing last year.  He denies dyspnea, weakness, palpitations, PND, orthopnea, dizziness, syncope, edema, or early satiety.  Home Medications    Prior to Admission medications   Medication Sig Start Date End Date Taking? Authorizing Provider  aspirin EC 81 MG tablet Take 81 mg by mouth at bedtime. Swallow whole.    [provider]  atorvastatin (LIPITOR) 40 MG tablet TAKE 1 TABLET BY MOUTH  DAILY Patient taking differently: Take 40 mg by mouth at bedtime. 11/01/19   Pleas Koch, NP  B-D ULTRAFINE III SHORT PEN 31G X 8 MM MISC USE AS INSTRUCTED TO INJECT INSULIN DAILY. 03/24/20   Pleas Koch, NP  betamethasone valerate (VALISONE) 0.1 % cream Apply 1 application topically 2 (two) times daily as needed. 07/18/20   Pleas Koch, NP  Blood Glucose Monitoring Suppl (ONETOUCH VERIO) w/Device KIT 1 Device by Does not apply route daily. Use as instructed to test blood sugar daily 03/31/20   Pleas Koch, NP  calcium carbonate (OSCAL) 1500 (600 Ca) MG TABS tablet Take 600 mg of elemental calcium by mouth at  bedtime.    [provider]  cholecalciferol (VITAMIN D3) 25 MCG (1000 UT) tablet Take 1,000 Units by mouth at bedtime.    [provider]  diclofenac Sodium (VOLTAREN) 1 % GEL Apply 1 application topically daily as needed (pain).    [provider]  epoetin alfa (EPOGEN,PROCRIT) 00762 UNIT/ML injection Inject 20,000 Units into the skin See admin instructions. Inject 20000 units subcutaneously  if hemoglobin <10 - checked monthly    [provider]  ferrous sulfate 325 (65 FE) MG tablet Take 325-650 mg by mouth See admin instructions. Take 2 tablets (650 mg) by mouth every morning and 1 tablet (325 mg) at night    [provider]  glimepiride (AMARYL) 4 MG tablet TAKE 1 TABLET BY MOUTH  DAILY WITH BREAKFAST Patient taking differently: Take 4 mg by mouth daily with breakfast. 01/04/20   Pleas Koch, NP  Hydrocortisone (PROCTOSOL HC RE) Place 1 Dose rectally daily as needed (hemorrhoids/itching).    [provider]  Insulin Glargine (LANTUS) 100 UNIT/ML Solostar Pen Inject 6 Units into the skin at bedtime.  [provider]  Insulin Pen Needle 29G X 12.7MM MISC BD Ultrafine Pen Needle Use as instructed to inject insulin daily 02/18/17   Pleas Koch, NP  Lancets [facility] DELICA PLUS [id number]) [place] USE AS INSTRUCT TO TEST BLOOD SUGAR ONCE DAILY 07/05/19   Pleas Koch, NP  levothyroxine (SYNTHROID) 175 MCG tablet Take 175 mcg by mouth daily before breakfast.    [provider]  meclizine (ANTIVERT) 12.5 MG tablet Take 1 tablet (12.5 mg total) by mouth 3 (three) times daily as needed for dizziness. 02/10/20   Pleas Koch, NP  Multiple Vitamin (MULTIVITAMIN WITH MINERALS) TABS tablet Take 1 tablet by mouth daily.    [provider]  Multiple Vitamins-Minerals (PRESERVISION AREDS 2 PO) Take by mouth.    [provider]  nitroGLYCERIN (NITROSTAT) 0.4 MG SL tablet Place 1 tablet (0.4 mg total)  under the tongue every 5 (five) minutes as needed for chest pain. 06/30/20   Theora Gianotti, NP  Omega-3 Fatty Acids (FISH OIL) 1200 MG CAPS Take 2,400 mg by mouth 2 (two) times daily.    [provider]  [facility] VERIO test strip USE AS INSTRUCTED TO TEST BLOOD SUGAR ONCE DAILY 07/26/19   Pleas Koch, NP  pantoprazole (PROTONIX) 20 MG tablet TAKE 1 TABLET BY MOUTH  DAILY Patient taking differently: Take 20 mg by mouth daily. 11/12/19   Pleas Koch, NP  Probiotic Product (ALIGN PO) Take 1 tablet by mouth daily.    [provider]  Psyllium (METAMUCIL PO) Take by mouth See admin instructions. Mix one heaping teaspoonful in apple juice and drink daily    [provider]  ranolazine (RANEXA) 1000 MG SR tablet Take 1 tablet (1,000 mg total) by mouth 2 (two) times daily. 02/28/20   End, Harrell Gave, MD  tamsulosin (FLOMAX) 0.4 MG CAPS capsule Take 1 capsule (0.4 mg total) by mouth daily after supper. 06/14/20 06/09/21  Zara Council A, PA-C  timolol (TIMOPTIC) 0.5 % ophthalmic solution Place 1 drop into both eyes daily. 03/17/17   [provider]    Review of Systems    Occasional chest pain as outlined above.  This is nitrate responsive.  He denies dyspnea, palpitations, PND, orthopnea, dizziness, syncope, edema, or early satiety.  All other systems reviewed and are otherwise negative except as noted above.  Physical Exam    VS:  BP (!) 140/58   Pulse 64   Ht _0  (1.778 m)   Wt 156 lb (70.8 kg)   BMI 22.38 kg/m  , BMI Body mass index is 22.38 kg/m. GEN: Well nourished, well developed, in no acute distress. HEENT: normal. Neck: Supple, no JVD, carotid bruits, or masses. Cardiac: RRR, 2/6 systolic murmur heard throughout, no rubs, or gallops. No clubbing, cyanosis, edema.  Radials/PT 2+ and equal bilaterally.  Respiratory:  Respirations regular and unlabored, clear to auscultation bilaterally. GI: Soft, nontender, nondistended, BS +  x 4. MS: no deformity or atrophy. Skin: warm and dry, no rash. Neuro:  Strength and sensation are intact. Psych: Normal affect.  Accessory Clinical Findings    ECG personally reviewed by me today -regular sinus rhythm, 64, first-degree AV block, left axis deviation with left anterior fascicular block, right bundle branch block (bifascicular block), LVH - no acute changes.  Lab Results  Component Value Date   WBC 4.8 06/30/2020   HGB 9.7 (L) 06/30/2020   HCT 29.4 (L) 06/30/2020   MCV 104.3 (H) 06/30/2020   PLT 164  06/30/2020   Lab Results  Component Value Date   CREATININE 1.65 (H) 07/17/2020   BUN 35 (H) 06/05/2020   NA 135 06/05/2020   K 4.3 06/05/2020   CL 102 06/05/2020   CO2 24 06/05/2020   Lab Results  Component Value Date   ALT 42 06/02/2020   AST 54 (H) 06/02/2020   ALKPHOS 97 06/02/2020   BILITOT 0.6 06/02/2020   Lab Results  Component Value Date   CHOL 129 04/20/2019   HDL 44.50 04/20/2019   LDLCALC 50 04/20/2019   TRIG 176.0 (H) 04/20/2019   CHOLHDL 3 04/20/2019    Lab Results  Component Value Date   HGBA1C 7.1 (H) 06/02/2020    Assessment & Plan    1.  Coronary artery disease with stable angina: Status post CABG 1994 with 2 of 3 patent grafts on catheterization in 2017.  He notes experiencing chest discomfort, almost exclusively at night, about once a month or less.  This resolves immediately with several nitroglycerin.  Overall, symptoms have improved in frequency since increasing Ranexa to 1000 mg twice daily last year.  Despite occasional nocturnal symptoms, he has been active without chest pain or dyspnea throughout the day.  He remains on aspirin, statin, and Ranexa as above.  He is intolerant to long-acting nitrates.  2.  Essential hypertension: With history of orthostasis.  Blood pressure is elevated today at 140/58 though he typically notes pressures in the 120s at home.  No changes today.  3.  Hyperlipidemia: LDL of 50 in February 2021.  We  will put in an order for fasting labs, for him to [facility] at his [facility].  Continue statin therapy.  4.  Type 2 diabetes mellitus: Remains on low-dose Lantus and Amaryl.  A1c was 7.1 in April.  He is followed by primary care.  5.  Bifascicular block with first-degree AV block/history of bradycardia: Rate stable at 64 bpm.  He is no longer on beta-blocker therapy.  No presyncope or syncope.  6.  Weakness: Admitted in April with weakness in the setting of UTI.  He has completed home health PT and is now back at the gym and feeling well.  7.  Carotid arterial disease: Status post left CEA with moderate right-sided disease and moderate plaque in the left carotid bulb on ultrasound in December 2021.  Plan to follow-up in December 2022.  8.  Abdominal aortic aneurysm: Stable by ultrasound last year at 3.4 cm.  We will arrange for follow-up.  9.  Disposition: We will arrange for follow-up lipids as well as abdominal aortic ultrasound.  Follow-up in clinic in 3 months or sooner if necessary.  Murray Hodgkins, NP 07/20/2020, 2:13 PM

## 2020-07-21 ENCOUNTER — Telehealth: Payer: Self-pay | Admitting: *Deleted

## 2020-07-21 ENCOUNTER — Encounter: Payer: Self-pay | Admitting: *Deleted

## 2020-07-21 NOTE — Telephone Encounter (Signed)
-----   Message from Abbie Sons, MD sent at 07/21/2020 10:51 AM EDT ----- Creatinine stable at 1.65.  Follow-up as scheduled

## 2020-07-21 NOTE — Telephone Encounter (Signed)
Left message and sent my chart message  

## 2020-07-28 ENCOUNTER — Inpatient Hospital Stay: Payer: Medicare Other

## 2020-07-28 ENCOUNTER — Inpatient Hospital Stay: Payer: Medicare Other | Attending: Oncology

## 2020-07-28 ENCOUNTER — Other Ambulatory Visit: Payer: Self-pay

## 2020-07-28 VITALS — BP 118/64 | HR 66

## 2020-07-28 DIAGNOSIS — N1832 Chronic kidney disease, stage 3b: Secondary | ICD-10-CM | POA: Insufficient documentation

## 2020-07-28 DIAGNOSIS — I129 Hypertensive chronic kidney disease with stage 1 through stage 4 chronic kidney disease, or unspecified chronic kidney disease: Secondary | ICD-10-CM | POA: Insufficient documentation

## 2020-07-28 DIAGNOSIS — Z79899 Other long term (current) drug therapy: Secondary | ICD-10-CM | POA: Insufficient documentation

## 2020-07-28 DIAGNOSIS — D7589 Other specified diseases of blood and blood-forming organs: Secondary | ICD-10-CM

## 2020-07-28 DIAGNOSIS — D631 Anemia in chronic kidney disease: Secondary | ICD-10-CM

## 2020-07-28 LAB — HEMOGLOBIN AND HEMATOCRIT, BLOOD
HCT: 28.8 % — ABNORMAL LOW (ref 39.0–52.0)
Hemoglobin: 9.7 g/dL — ABNORMAL LOW (ref 13.0–17.0)

## 2020-07-28 MED ORDER — EPOETIN ALFA-EPBX 10000 UNIT/ML IJ SOLN
20000.0000 [IU] | Freq: Once | INTRAMUSCULAR | Status: AC
  Administered 2020-07-28: 20000 [IU] via SUBCUTANEOUS
  Filled 2020-07-28: qty 2

## 2020-08-03 ENCOUNTER — Other Ambulatory Visit: Payer: Self-pay | Admitting: Primary Care

## 2020-08-03 DIAGNOSIS — K219 Gastro-esophageal reflux disease without esophagitis: Secondary | ICD-10-CM

## 2020-08-13 ENCOUNTER — Other Ambulatory Visit: Payer: Self-pay | Admitting: Primary Care

## 2020-08-13 DIAGNOSIS — E119 Type 2 diabetes mellitus without complications: Secondary | ICD-10-CM

## 2020-08-17 ENCOUNTER — Ambulatory Visit (INDEPENDENT_AMBULATORY_CARE_PROVIDER_SITE_OTHER): Payer: Medicare Other | Admitting: Physician Assistant

## 2020-08-17 ENCOUNTER — Other Ambulatory Visit: Payer: Self-pay

## 2020-08-17 ENCOUNTER — Encounter: Payer: Self-pay | Admitting: Physician Assistant

## 2020-08-17 VITALS — BP 134/56 | HR 69 | Ht 70.0 in | Wt 155.0 lb

## 2020-08-17 DIAGNOSIS — R3914 Feeling of incomplete bladder emptying: Secondary | ICD-10-CM

## 2020-08-17 DIAGNOSIS — N401 Enlarged prostate with lower urinary tract symptoms: Secondary | ICD-10-CM | POA: Diagnosis not present

## 2020-08-17 LAB — BLADDER SCAN AMB NON-IMAGING

## 2020-08-17 NOTE — Progress Notes (Signed)
08/17/2020 1:39 PM   Tommy [name] [birth year] [id number]  CC: Chief Complaint  Patient presents with   Benign Prostatic Hypertrophy   HPI: Tommy [name] is a 87 y.o. male with PMH BPH with incomplete bladder emptying on tamsulosin who presents today for repeat PVR.   Today he reports feeling well with no acute urinary concerns.  He states he feels he is emptying appropriately and denies dysuria.  He continues to take Flomax and denies dizziness or lightheadedness on this medication.  PVR 141 mL.Marland Kitchen  PMH: Past Medical History:  Diagnosis Date   AAA (abdominal aortic aneurysm) ([place])    a. 06/2019 Duplex: 3.4cm.   Anemia    Carotid artery occlusion    a. 2004 s/p L carotid endarterectomy; b. 02/2019 U/S: 1-39% bilat ICA stenosis. <50% bilat CCA stenosis; c. 02/2020 U/S: 50-69% RICA, s/p L CEA w/o hemodynamically signif narrowing.   CKD (chronic kidney disease) stage 3, GFR 30-59 ml/min (HCC)    Coronary artery disease    a. 1994 s/p CABG x 3, [place] (LIMA->LAD, VG->dRCA, VG->unknown vessel); b. 02/2016 Cath: LM 99d, LAD 100ost, LCX 100ost, RCA 80ost, diffuse 85-62m. LIMA->LAD ok, VG->RCA ok. VG->unknown vessel 100.   Diabetes mellitus without complication (HCC)    Diastolic dysfunction    a. 02/2017 Echo: EF 55-65%, no rwma. Nl DD. Ao sclerosis w/o stenosis. Mod TR. Mild MR; b. 02/2020 Echo: EF 60-65%, no rwma, Gr2 DD, nl RV size/fxn.    Dyslipidemia    GERD (gastroesophageal reflux disease)    Hemorrhoids    Heparin induced thrombocytopenia (HCC)    Hypertension    Incontinence of feces 10/28/2019   Pre-syncope & Palpitations    a. 08/2019 Zio: RSR, avg HR 59 (44-86). Rare PACs/PVCs. 2 atrial runs (max 8 beats). Triggered events = sinusr rhythm w/ PVCs; b. 03/2020 Zio: RSR, avg HR 63 (35-115). Rare PACs/PVCs. NSVT x 2 (up to 5 beats/154 bpm). 13 atrial runs (max 15 beats; 141 bpm). Brief episodes of sinus brady and jxnl rhythm (min HR 31). Triggered events = RSR,  PACs.   Pulmonary embolism ([place])    Thyroid disease     Surgical History: Past Surgical History:  Procedure Laterality Date   CAROTID ENDARTERECTOMY Left 2004   CORONARY ARTERY BYPASS GRAFT  1994   [name]   TONSILLECTOMY AND ADENOIDECTOMY      Home Medications:  Allergies as of 08/17/2020       Reactions   Heparin Other (See Comments)   "it caused low platelet count and made him have a clot"   Patiromer Other (See Comments)   Unknown reaction   Sodium Zirconium Cyclosilicate Other (See Comments)   Unknown reaction        Medication List        Accurate as of August 17, 2020  1:39 PM. If you have any questions, ask your nurse or doctor.          ALIGN PO Take 1 tablet by mouth daily.   aspirin EC 81 MG tablet Take 81 mg by mouth at bedtime. Swallow whole.   atorvastatin 40 MG tablet Commonly known as: LIPITOR TAKE 1 TABLET BY MOUTH  DAILY What changed: when to take this   betamethasone valerate 0.1 % cream Commonly known as: VALISONE Apply 1 application topically 2 (two) times daily as needed.   calcium carbonate 1500 (600 Ca) MG Tabs tablet Commonly known as: OSCAL Take 600 mg of elemental calcium by mouth at  bedtime.   cholecalciferol 25 MCG (1000 UNIT) tablet Commonly known as: VITAMIN D3 Take 1,000 Units by mouth at bedtime.   diclofenac Sodium 1 % Gel Commonly known as: VOLTAREN Apply 1 application topically daily as needed (pain).   epoetin alfa 10000 UNIT/ML injection Commonly known as: EPOGEN Inject 20,000 Units into the skin See admin instructions. Inject 20000 units subcutaneously  if hemoglobin <10 - checked monthly   ferrous sulfate 325 (65 FE) MG tablet Take 325-650 mg by mouth See admin instructions. Take 2 tablets (650 mg) by mouth every morning and 1 tablet (325 mg) at night   Fish Oil 1200 MG Caps Take 2,400 mg by mouth 2 (two) times daily.   glimepiride 4 MG tablet Commonly known as: AMARYL TAKE 1 TABLET BY MOUTH   DAILY WITH BREAKFAST   Insulin Pen Needle 29G X 12.7MM Misc BD Ultrafine Pen Needle Use as instructed to inject insulin daily   B-D ULTRAFINE III SHORT PEN 31G X 8 MM Misc Generic drug: Insulin Pen Needle USE AS INSTRUCTED TO INJECT INSULIN DAILY.   Lantus SoloStar 100 UNIT/ML Solostar Pen Generic drug: insulin glargine Inject 6 Units into the skin at bedtime.   levothyroxine 175 MCG tablet Commonly known as: SYNTHROID Take 175 mcg by mouth daily before breakfast.   meclizine 12.5 MG tablet Commonly known as: ANTIVERT Take 1 tablet (12.5 mg total) by mouth 3 (three) times daily as needed for dizziness.   METAMUCIL PO Take by mouth See admin instructions. Mix one heaping teaspoonful in apple juice and drink daily   multivitamin with minerals Tabs tablet Take 1 tablet by mouth daily.   nitroGLYCERIN 0.4 MG SL tablet Commonly known as: NITROSTAT Place 1 tablet (0.4 mg total) under the tongue every 5 (five) minutes as needed for chest pain.   OneTouch Delica Plus [id number] Misc USE AS INSTRUCT TO TEST BLOOD SUGAR ONCE DAILY   OneTouch Verio test strip Generic drug: glucose blood USE AS INSTRUCTED TO TEST BLOOD SUGAR ONCE DAILY   OneTouch Verio w/Device Kit 1 Device by Does not apply route daily. Use as instructed to test blood sugar daily   pantoprazole 20 MG tablet Commonly known as: PROTONIX Take 1 tablet (20 mg total) by mouth daily. For heartburn   PRESERVISION AREDS 2 PO Take by mouth.   PROCTOSOL HC RE Place 1 Dose rectally daily as needed (hemorrhoids/itching).   ranolazine 1000 MG SR tablet Commonly known as: RANEXA Take 1 tablet (1,000 mg total) by mouth 2 (two) times daily.   tamsulosin 0.4 MG Caps capsule Commonly known as: FLOMAX Take 1 capsule (0.4 mg total) by mouth daily after supper.   timolol 0.5 % ophthalmic solution Commonly known as: TIMOPTIC Place 1 drop into both eyes daily.        Allergies:  Allergies  Allergen Reactions    Heparin Other (See Comments)    "it caused low platelet count and made him have a clot"   Patiromer Other (See Comments)    Unknown reaction   Sodium Zirconium Cyclosilicate Other (See Comments)    Unknown reaction    Family History: Family History  Problem Relation Age of Onset   Heart attack Mother    Heart disease Mother    Stroke Mother    Diabetes Father    Coronary artery disease Sister     Social History:   reports that he quit smoking about 44 years ago. His smoking use included cigarettes. He has a 37.50 pack-year smoking  history. He has never used smokeless tobacco. He reports current alcohol use of about 1.0 standard drink of alcohol per week. He reports that he does not use drugs.  Physical Exam: BP (!) 134/56   Pulse 69   Ht $R'5\' 10"'MV$  (1.778 m)   Wt 155 lb (70.3 kg)   BMI 22.24 kg/m   Constitutional:  Alert and oriented, no acute distress, nontoxic appearing HEENT: [place], AT Cardiovascular: No clubbing, cyanosis, or edema Respiratory: Normal respiratory effort, no increased work of breathing Skin: No rashes, bruises or suspicious lesions Neurologic: Grossly intact, no focal deficits, moving all 4 extremities Psychiatric: Normal mood and affect  Laboratory Data: Results for orders placed or performed in visit on 08/17/20  Bladder Scan (Post Void Residual) in office  Result Value Ref Range   Scan Result 167mL    Assessment & Plan:   1. Benign prostatic hyperplasia with incomplete bladder emptying Emptying stable to improved today, patient denies bothersome urinary symptoms, and is tolerating Flomax without orthostasis.  Continue Flomax and plan for symptom recheck and PVR in 6 months.  Patient is in agreement with this plan. - Bladder Scan (Post Void Residual) in office  Return in about 6 months (around 02/16/2021) for Symptom recheck with PVR.  Debroah Loop, PA-C  [facility] [address], [place], [place]  [address] [phone]

## 2020-08-25 ENCOUNTER — Inpatient Hospital Stay: Payer: Medicare Other

## 2020-08-25 ENCOUNTER — Inpatient Hospital Stay: Payer: Medicare Other | Attending: Oncology

## 2020-08-25 VITALS — BP 132/57 | HR 62

## 2020-08-25 DIAGNOSIS — D631 Anemia in chronic kidney disease: Secondary | ICD-10-CM

## 2020-08-25 DIAGNOSIS — N183 Chronic kidney disease, stage 3 unspecified: Secondary | ICD-10-CM

## 2020-08-25 DIAGNOSIS — D7589 Other specified diseases of blood and blood-forming organs: Secondary | ICD-10-CM

## 2020-08-25 DIAGNOSIS — I129 Hypertensive chronic kidney disease with stage 1 through stage 4 chronic kidney disease, or unspecified chronic kidney disease: Secondary | ICD-10-CM | POA: Insufficient documentation

## 2020-08-25 DIAGNOSIS — Z79899 Other long term (current) drug therapy: Secondary | ICD-10-CM | POA: Diagnosis not present

## 2020-08-25 LAB — HEMOGLOBIN AND HEMATOCRIT, BLOOD
HCT: 28.3 % — ABNORMAL LOW (ref 39.0–52.0)
Hemoglobin: 9.7 g/dL — ABNORMAL LOW (ref 13.0–17.0)

## 2020-08-25 MED ORDER — EPOETIN ALFA-EPBX 10000 UNIT/ML IJ SOLN
20000.0000 [IU] | Freq: Once | INTRAMUSCULAR | Status: AC
  Administered 2020-08-25: 20000 [IU] via SUBCUTANEOUS
  Filled 2020-08-25: qty 2

## 2020-08-30 ENCOUNTER — Other Ambulatory Visit: Payer: Self-pay | Admitting: Primary Care

## 2020-08-30 DIAGNOSIS — I8393 Asymptomatic varicose veins of bilateral lower extremities: Secondary | ICD-10-CM

## 2020-09-07 ENCOUNTER — Encounter: Payer: Self-pay | Admitting: Nurse Practitioner

## 2020-09-07 ENCOUNTER — Other Ambulatory Visit: Payer: Self-pay

## 2020-09-07 ENCOUNTER — Ambulatory Visit (INDEPENDENT_AMBULATORY_CARE_PROVIDER_SITE_OTHER): Payer: Medicare Other

## 2020-09-07 DIAGNOSIS — I714 Abdominal aortic aneurysm, without rupture, unspecified: Secondary | ICD-10-CM

## 2020-09-08 ENCOUNTER — Telehealth: Payer: Self-pay | Admitting: *Deleted

## 2020-09-08 DIAGNOSIS — I714 Abdominal aortic aneurysm, without rupture, unspecified: Secondary | ICD-10-CM

## 2020-09-08 NOTE — Telephone Encounter (Signed)
Results and recommendations reviewed with patient and he verbalized understanding with no further questions at this time.  

## 2020-09-08 NOTE — Telephone Encounter (Signed)
-----   Message from Theora Gianotti, NP sent at 09/07/2020  6:21 PM EDT ----- Relatively stable size in AAA. Only slightly larger than prior measurement - now 3.6 cm, prev 3.4 cm in 06/2019.  F/u study in 1 year.

## 2020-09-08 NOTE — Telephone Encounter (Signed)
Left voicemail message to call back for review of results.  

## 2020-09-11 ENCOUNTER — Telehealth: Payer: Self-pay | Admitting: Primary Care

## 2020-09-11 NOTE — Telephone Encounter (Signed)
LVM for pt to rtn my call to schedule AWV with [facility].  

## 2020-09-12 ENCOUNTER — Other Ambulatory Visit: Payer: Self-pay | Admitting: Primary Care

## 2020-09-12 DIAGNOSIS — Z794 Long term (current) use of insulin: Secondary | ICD-10-CM

## 2020-09-12 DIAGNOSIS — E118 Type 2 diabetes mellitus with unspecified complications: Secondary | ICD-10-CM

## 2020-09-22 ENCOUNTER — Inpatient Hospital Stay: Payer: Medicare Other

## 2020-09-22 ENCOUNTER — Inpatient Hospital Stay: Payer: Medicare Other | Attending: Oncology

## 2020-09-22 DIAGNOSIS — N183 Anemia in chronic kidney disease: Secondary | ICD-10-CM

## 2020-09-22 DIAGNOSIS — D631 Anemia in chronic kidney disease: Secondary | ICD-10-CM

## 2020-09-22 DIAGNOSIS — D7589 Other specified diseases of blood and blood-forming organs: Secondary | ICD-10-CM

## 2020-09-22 DIAGNOSIS — N189 Chronic kidney disease, unspecified: Secondary | ICD-10-CM | POA: Insufficient documentation

## 2020-09-22 DIAGNOSIS — I129 Hypertensive chronic kidney disease with stage 1 through stage 4 chronic kidney disease, or unspecified chronic kidney disease: Secondary | ICD-10-CM | POA: Insufficient documentation

## 2020-09-22 LAB — HEMOGLOBIN AND HEMATOCRIT, BLOOD
HCT: 30.8 % — ABNORMAL LOW (ref 39.0–52.0)
Hemoglobin: 10.2 g/dL — ABNORMAL LOW (ref 13.0–17.0)

## 2020-09-28 ENCOUNTER — Ambulatory Visit: Payer: Medicare Other | Admitting: Primary Care

## 2020-09-29 ENCOUNTER — Inpatient Hospital Stay: Payer: Medicare Other

## 2020-10-05 ENCOUNTER — Ambulatory Visit: Payer: Medicare Other | Admitting: Primary Care

## 2020-10-05 ENCOUNTER — Other Ambulatory Visit: Payer: Self-pay

## 2020-10-05 VITALS — BP 132/70 | HR 64 | Temp 96.9°F | Wt 156.2 lb

## 2020-10-05 DIAGNOSIS — E119 Type 2 diabetes mellitus without complications: Secondary | ICD-10-CM

## 2020-10-05 DIAGNOSIS — Z794 Long term (current) use of insulin: Secondary | ICD-10-CM

## 2020-10-05 LAB — POCT GLYCOSYLATED HEMOGLOBIN (HGB A1C): Hemoglobin A1C: 6.1 % — AB (ref 4.0–5.6)

## 2020-10-05 MED ORDER — LANTUS SOLOSTAR 100 UNIT/ML ~~LOC~~ SOPN: 2.0000 [IU] | PEN_INJECTOR | Freq: Every day | SUBCUTANEOUS | 0 refills | Status: AC

## 2020-10-05 NOTE — Progress Notes (Signed)
Subjective:    Patient ID: Tommy [name], male    DOB: 1926/10/09, 87 y.o.   MRN: [id number]  HPI  Tommy [name] is a very pleasant 87 y.o. male with a history of type 2 diabetes, CAD, hypothyroidism, CKD, chronic anemia who presents today for follow up of diabetes.   Current medications include: Glimepiride 4 mg daily, Lantus 6 units HS. He reduced his Lantus to 4 units four months ago.   He is checking his blood glucose 1 times daily and is getting readings of:  AM fasting 80-90's. Today was 76.   Last A1C: 7.1 in April, 6.1 today Last Eye Exam: UTD Last Foot Exam: Due Pneumonia Vaccination: 2019 Urine Microalbumin: UTD Statin: atorvastatin   Dietary changes since last visit: He endorses a healthy diet.    Exercise: Gym three times weekly       Review of Systems  Respiratory:  Negative for shortness of breath.   Cardiovascular:  Negative for chest pain.  Neurological:  Negative for dizziness and numbness.        Past Medical History:  Diagnosis Date   AAA (abdominal aortic aneurysm) ([place])    a. 06/2019 Abd U/S: AAA 3.4cm; b. 09/2020 Abd U/S: AAA 3.6cm.   Anemia    Carotid artery occlusion    a. 2004 s/p L carotid endarterectomy; b. 02/2019 U/S: 1-39% bilat ICA stenosis. <50% bilat CCA stenosis; c. 02/2020 U/S: 50-69% RICA, s/p L CEA w/o hemodynamically signif narrowing.   CKD (chronic kidney disease) stage 3, GFR 30-59 ml/min (HCC)    Coronary artery disease    a. 1994 s/p CABG x 3, [place], [place] (LIMA->LAD, VG->dRCA, VG->unknown vessel); b. 02/2016 Cath: LM 99d, LAD 100ost, LCX 100ost, RCA 80ost, diffuse 85-5m LIMA->LAD ok, VG->RCA ok. VG->unknown vessel 100.   Diabetes mellitus without complication (HCC)    Diastolic dysfunction    a. 02/2017 Echo: EF 55-65%, no rwma. Nl DD. Ao sclerosis w/o stenosis. Mod TR. Mild MR; b. 02/2020 Echo: EF 60-65%, no rwma, Gr2 DD, nl RV size/fxn.    Dyslipidemia    GERD (gastroesophageal reflux disease)     Hemorrhoids    Heparin induced thrombocytopenia (HCC)    Hypertension    Incontinence of feces 10/28/2019   Pre-syncope & Palpitations    a. 08/2019 Zio: RSR, avg HR 59 (44-86). Rare PACs/PVCs. 2 atrial runs (max 8 beats). Triggered events = sinusr rhythm w/ PVCs; b. 03/2020 Zio: RSR, avg HR 63 (35-115). Rare PACs/PVCs. NSVT x 2 (up to 5 beats/154 bpm). 13 atrial runs (max 15 beats; 141 bpm). Brief episodes of sinus brady and jxnl rhythm (min HR 31). Triggered events = RSR, PACs.   Pulmonary embolism ([place]    Thyroid disease     Social History   Socioeconomic History   Marital status: Widowed    Spouse name: Not on file   Number of children: 4   Years of education: 16   Highest education level: Bachelor's degree (e.g., BA, AB, BS)  Occupational History   Occupation: retired  Tobacco Use   Smoking status: Former    Packs/day: 1.50    Years: 25.00    Pack years: 37.50    Types: Cigarettes    Quit date: 1978    Years since quitting: 44.6   Smokeless tobacco: Never  Vaping Use   Vaping Use: Never used  Substance and Sexual Activity   Alcohol use: Yes    Alcohol/week: 1.0 standard drink    Types: 1  Glasses of wine per week    Comment: socially    Drug use: No   Sexual activity: Not Currently  Other Topics Concern   Not on file  Social History Narrative   ** Merged History Encounter **       Widower. 4 children, 7 grandchildren, 1 great grandchild. 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    Social Determinants of Health   Financial Resource Strain: Not on file  Food Insecurity: Not on file  Transportation Needs: Not on file  Physical Activity: Not on file  Stress: Not on file  Social Connections: Not on file  Intimate Partner Violence: Not on file    Past Surgical History:  Procedure Laterality Date   CAROTID ENDARTERECTOMY Left 2004   CORONARY ARTERY BYPASS GRAFT  1994   [name]   TONSILLECTOMY AND  ADENOIDECTOMY      Family History  Problem Relation Age of Onset   Heart attack Mother    Heart disease Mother    Stroke Mother    Diabetes Father    Coronary artery disease Sister     Allergies  Allergen Reactions   Heparin Other (See Comments)    "it caused low platelet count and made him have a clot"   Patiromer Other (See Comments)    Unknown reaction   Sodium Zirconium Cyclosilicate Other (See Comments)    Unknown reaction    Current Outpatient Medications on File Prior to Visit  Medication Sig Dispense Refill   aspirin EC 81 MG tablet Take 81 mg by mouth at bedtime. Swallow whole.     atorvastatin (LIPITOR) 40 MG tablet TAKE 1 TABLET BY MOUTH  DAILY (Patient taking differently: Take 40 mg by mouth at bedtime.) 90 tablet 3   B-D ULTRAFINE III SHORT PEN 31G X 8 MM MISC USE AS INSTRUCTED TO INJECT INSULIN DAILY. 100 each 5   betamethasone valerate (VALISONE) 0.1 % cream APPLY TO AFFECTED AREA TWICE A DAY AS NEEDED 45 g 0   Blood Glucose Monitoring Suppl (ONETOUCH VERIO) w/Device KIT 1 Device by Does not apply route daily. Use as instructed to test blood sugar daily 1 kit 0   calcium carbonate (OSCAL) 1500 (600 Ca) MG TABS tablet Take 600 mg of elemental calcium by mouth at bedtime.     cholecalciferol (VITAMIN D3) 25 MCG (1000 UT) tablet Take 1,000 Units by mouth at bedtime.     diclofenac Sodium (VOLTAREN) 1 % GEL Apply 1 application topically daily as needed (pain).     epoetin alfa (EPOGEN,PROCRIT) 09811 UNIT/ML injection Inject 20,000 Units into the skin See admin instructions. Inject 20000 units subcutaneously  if hemoglobin <10 - checked monthly     ferrous sulfate 325 (65 FE) MG tablet Take 325-650 mg by mouth See admin instructions. Take 2 tablets (650 mg) by mouth every morning and 1 tablet (325 mg) at night     glimepiride (AMARYL) 4 MG tablet TAKE 1 TABLET BY MOUTH  DAILY WITH BREAKFAST (Patient taking differently: Take 4 mg by mouth daily with breakfast.) 120 tablet 2    glucose blood (ONETOUCH VERIO) test strip USE AS INSTRUCTED TO TEST BLOOD SUGAR twice DAILY 200 strip 2   Hydrocortisone (PROCTOSOL HC RE) Place 1 Dose rectally daily as needed (hemorrhoids/itching).     insulin glargine (LANTUS SOLOSTAR) 100 UNIT/ML Solostar Pen Inject 6 Units into the skin at bedtime. (Patient taking differently: Inject 4 Units into the skin at bedtime.)  15 mL 0   Insulin Pen Needle 29G X 12.7MM MISC BD Ultrafine Pen Needle Use as instructed to inject insulin daily 300 each 1   Lancets (ONETOUCH DELICA PLUS [id number]) MISC USE AS INSTRUCT TO TEST BLOOD SUGAR twice DAILY 200 each 2   levothyroxine (SYNTHROID) 175 MCG tablet Take 175 mcg by mouth daily before breakfast.     Multiple Vitamin (MULTIVITAMIN WITH MINERALS) TABS tablet Take 1 tablet by mouth daily.     Multiple Vitamins-Minerals (PRESERVISION AREDS 2 PO) Take by mouth.     nitroGLYCERIN (NITROSTAT) 0.4 MG SL tablet Place 1 tablet (0.4 mg total) under the tongue every 5 (five) minutes as needed for chest pain. 25 tablet 0   Omega-3 Fatty Acids (FISH OIL) 1200 MG CAPS Take 2,400 mg by mouth 2 (two) times daily.     pantoprazole (PROTONIX) 20 MG tablet Take 1 tablet (20 mg total) by mouth daily. For heartburn 90 tablet 3   Probiotic Product (ALIGN PO) Take 1 tablet by mouth daily.     Psyllium (METAMUCIL PO) Take by mouth See admin instructions. Mix one heaping teaspoonful in apple juice and drink daily     ranolazine (RANEXA) 1000 MG SR tablet Take 1 tablet (1,000 mg total) by mouth 2 (two) times daily. 180 tablet 3   tamsulosin (FLOMAX) 0.4 MG CAPS capsule Take 1 capsule (0.4 mg total) by mouth daily after supper. 90 capsule 3   timolol (TIMOPTIC) 0.5 % ophthalmic solution Place 1 drop into both eyes daily.  11   No current facility-administered medications on file prior to visit.    BP 132/70 (BP Location: Left Arm, Patient Position: Sitting, Cuff Size: Normal)   Pulse 64   Temp (!) 96.9 F (36.1 C) (Temporal)    Wt 156 lb 4 oz (70.9 kg)   SpO2 97%   BMI 22.42 kg/m  Objective:   Physical Exam Cardiovascular:     Rate and Rhythm: Normal rate and regular rhythm.  Pulmonary:     Effort: Pulmonary effort is normal.     Breath sounds: Normal breath sounds. No wheezing or rales.  Musculoskeletal:     Cervical back: Neck supple.  Skin:    General: Skin is warm and dry.  Neurological:     Mental Status: He is alert and oriented to person, place, and time.          Assessment & Plan:      This visit occurred during the SARS-CoV-2 public health emergency.  Safety protocols were in place, including screening questions prior to the visit, additional usage of staff PPE, and extensive cleaning of exam room while observing appropriate contact time as indicated for disinfecting solutions.

## 2020-10-05 NOTE — Patient Instructions (Signed)
Reduce your Lantus insulin to 2 units nightly for diabetes.   Continue your Glimepiride 4 mg for now.  Please notify me if you see blood sugar readings consistently below 80, especially if you feel shaky, dizzy, weak.  Please schedule a physical to meet with me in 6 months.   It was a pleasure to see you today!

## 2020-10-05 NOTE — Assessment & Plan Note (Signed)
Well controlled with A1C today of 6.1!   Strongly recommended we discontinue insulin, he is insistent upon continuation but will reduce down to 2 units of Lantus. He feels very strongly about continuing Glimiperide 4 mg.   We had a frank conversation regarding symptoms of hypoglycemia and the need to eat regular meals throughout the day.   He will reduce Lantus to 2 units. Continue Glimepiride 4 mg for now.  Managed on statin. Urine microalbumin negative and UTD.  Follow up in 6 months.

## 2020-10-20 ENCOUNTER — Inpatient Hospital Stay: Payer: Medicare Other | Attending: Oncology

## 2020-10-20 ENCOUNTER — Inpatient Hospital Stay: Payer: Medicare Other

## 2020-10-20 ENCOUNTER — Inpatient Hospital Stay (HOSPITAL_BASED_OUTPATIENT_CLINIC_OR_DEPARTMENT_OTHER): Payer: Medicare Other | Admitting: Oncology

## 2020-10-20 ENCOUNTER — Encounter: Payer: Self-pay | Admitting: Oncology

## 2020-10-20 VITALS — BP 164/63 | HR 70 | Temp 96.8°F | Resp 16 | Wt 155.7 lb

## 2020-10-20 DIAGNOSIS — D631 Anemia in chronic kidney disease: Secondary | ICD-10-CM | POA: Insufficient documentation

## 2020-10-20 DIAGNOSIS — I714 Abdominal aortic aneurysm, without rupture: Secondary | ICD-10-CM | POA: Insufficient documentation

## 2020-10-20 DIAGNOSIS — E1122 Type 2 diabetes mellitus with diabetic chronic kidney disease: Secondary | ICD-10-CM | POA: Insufficient documentation

## 2020-10-20 DIAGNOSIS — D7589 Other specified diseases of blood and blood-forming organs: Secondary | ICD-10-CM | POA: Diagnosis not present

## 2020-10-20 DIAGNOSIS — K219 Gastro-esophageal reflux disease without esophagitis: Secondary | ICD-10-CM | POA: Diagnosis not present

## 2020-10-20 DIAGNOSIS — E785 Hyperlipidemia, unspecified: Secondary | ICD-10-CM | POA: Diagnosis not present

## 2020-10-20 DIAGNOSIS — I2510 Atherosclerotic heart disease of native coronary artery without angina pectoris: Secondary | ICD-10-CM | POA: Diagnosis not present

## 2020-10-20 DIAGNOSIS — Z79899 Other long term (current) drug therapy: Secondary | ICD-10-CM | POA: Diagnosis not present

## 2020-10-20 DIAGNOSIS — Z794 Long term (current) use of insulin: Secondary | ICD-10-CM | POA: Diagnosis not present

## 2020-10-20 DIAGNOSIS — E079 Disorder of thyroid, unspecified: Secondary | ICD-10-CM | POA: Insufficient documentation

## 2020-10-20 DIAGNOSIS — I129 Hypertensive chronic kidney disease with stage 1 through stage 4 chronic kidney disease, or unspecified chronic kidney disease: Secondary | ICD-10-CM | POA: Insufficient documentation

## 2020-10-20 DIAGNOSIS — N183 Chronic kidney disease, stage 3 unspecified: Secondary | ICD-10-CM | POA: Insufficient documentation

## 2020-10-20 DIAGNOSIS — Z86711 Personal history of pulmonary embolism: Secondary | ICD-10-CM | POA: Insufficient documentation

## 2020-10-20 DIAGNOSIS — Z86010 Personal history of colonic polyps: Secondary | ICD-10-CM | POA: Insufficient documentation

## 2020-10-20 LAB — CBC WITH DIFFERENTIAL/PLATELET
Abs Immature Granulocytes: 0.03 10*3/uL (ref 0.00–0.07)
Basophils Absolute: 0 10*3/uL (ref 0.0–0.1)
Basophils Relative: 0 %
Eosinophils Absolute: 0.1 10*3/uL (ref 0.0–0.5)
Eosinophils Relative: 2 %
HCT: 28.6 % — ABNORMAL LOW (ref 39.0–52.0)
Hemoglobin: 9.4 g/dL — ABNORMAL LOW (ref 13.0–17.0)
Immature Granulocytes: 1 %
Lymphocytes Relative: 21 %
Lymphs Abs: 1.2 10*3/uL (ref 0.7–4.0)
MCH: 33.8 pg (ref 26.0–34.0)
MCHC: 32.9 g/dL (ref 30.0–36.0)
MCV: 102.9 fL — ABNORMAL HIGH (ref 80.0–100.0)
Monocytes Absolute: 0.6 10*3/uL (ref 0.1–1.0)
Monocytes Relative: 10 %
Neutro Abs: 3.8 10*3/uL (ref 1.7–7.7)
Neutrophils Relative %: 66 %
Platelets: 141 10*3/uL — ABNORMAL LOW (ref 150–400)
RBC: 2.78 MIL/uL — ABNORMAL LOW (ref 4.22–5.81)
RDW: 13.6 % (ref 11.5–15.5)
WBC: 5.7 10*3/uL (ref 4.0–10.5)
nRBC: 0 % (ref 0.0–0.2)

## 2020-10-20 LAB — IRON AND TIBC
Iron: 128 ug/dL (ref 45–182)
Saturation Ratios: 50 % — ABNORMAL HIGH (ref 17.9–39.5)
TIBC: 258 ug/dL (ref 250–450)
UIBC: 130 ug/dL

## 2020-10-20 LAB — FERRITIN: Ferritin: 780 ng/mL — ABNORMAL HIGH (ref 24–336)

## 2020-10-20 MED ORDER — EPOETIN ALFA-EPBX 10000 UNIT/ML IJ SOLN
20000.0000 [IU] | Freq: Once | INTRAMUSCULAR | Status: AC
  Administered 2020-10-20: 20000 [IU] via SUBCUTANEOUS
  Filled 2020-10-20: qty 2

## 2020-10-20 NOTE — Progress Notes (Signed)
Hematology/Oncology follow up note [facility] [phone] [phone]   Patient Care Team: Pleas Koch, NP as PCP - General (Internal Medicine) End, Harrell Gave, MD as PCP - Cardiology (Cardiology) Vickie Epley, MD as PCP - Electrophysiology (Cardiology) Dingeldein, Remo Lipps, MD as Referring Physician (Ophthalmology) End, Harrell Gave, MD as Consulting Physician (Cardiology) Earlie Server, MD as Consulting Physician (Oncology)  REFERRING PROVIDER: Dr.Lateef REASON FOR VISIT Follow up for treatment of anemia of CKD, on erythropoietin replacement therapy.  HISTORY OF PRESENTING ILLNESS:  Tommy [name] is a  87 y.o.  male with PMH listed below who was referred to me for evaluation of anemia.  Patient recently removed from [place] to [place] to live with his son.  He has chronic kidney disease and anemia associated with CKD.  He reports that he has been getting Procrit 10,000 units every 2 weeks for many years.  Denies any recent thrombosis events.  Patient reports that at the last Procrit shot he received was back in September 2018 before he moved here. He establish care with Dr. Holley Raring for his CKD and he was referred to me for evaluation of anemia and possible Procrit shots.  Patient had lab work done at [facility] on 05/15/2017 hemoglobin 9.6, MCV 98, WBC 4.5, platelet count 1 70,000, normal differential use.   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 He has IVC filter in place.  INTERVAL HISTORY Tommy [name] is a 87 year old male who is seen by Dr. Tasia Catchings for anemia secondary to CKD.  He receives Retacrit approximately every 4 weeks for hemoglobin less than 10.  Since his last visit, he denies any interval infections or hospitalizations.  Reports doing well.  Review of Systems  Constitutional:  Positive for malaise/fatigue.   MEDICAL HISTORY:  Past  Medical History:  Diagnosis Date   AAA (abdominal aortic aneurysm) ([place])    a. 06/2019 Abd U/S: AAA 3.4cm; b. 09/2020 Abd U/S: AAA 3.6cm.   Anemia    Carotid artery occlusion    a. 2004 s/p L carotid endarterectomy; b. 02/2019 U/S: 1-39% bilat ICA stenosis. <50% bilat CCA stenosis; c. 02/2020 U/S: 50-69% RICA, s/p L CEA w/o hemodynamically signif narrowing.   CKD (chronic kidney disease) stage 3, GFR 30-59 ml/min (HCC)    Coronary artery disease    a. 1994 s/p CABG x 3, [place], [place] (LIMA->LAD, VG->dRCA, VG->unknown vessel); b. 02/2016 Cath: LM 99d, LAD 100ost, LCX 100ost, RCA 80ost, diffuse 85-16m LIMA->LAD ok, VG->RCA ok. VG->unknown vessel 100.   Diabetes mellitus without complication (HCC)    Diastolic dysfunction    a. 02/2017 Echo: EF 55-65%, no rwma. Nl DD. Ao sclerosis w/o stenosis. Mod TR. Mild MR; b. 02/2020 Echo: EF 60-65%, no rwma, Gr2 DD, nl RV size/fxn.    Dyslipidemia    GERD (gastroesophageal reflux disease)    Hemorrhoids    Heparin induced thrombocytopenia (HCC)    Hypertension    Incontinence of feces 10/28/2019   Pre-syncope & Palpitations    a. 08/2019 Zio: RSR, avg HR 59 (44-86). Rare PACs/PVCs. 2 atrial runs (max 8 beats). Triggered events = sinusr rhythm w/ PVCs; b. 03/2020 Zio: RSR, avg HR 63 (35-115). Rare PACs/PVCs. NSVT x 2 (up to 5 beats/154 bpm). 13 atrial runs (max 15 beats; 141 bpm). Brief episodes of sinus brady and jxnl rhythm (min HR 31). Triggered events = RSR, PACs.   Pulmonary embolism ([place]  Thyroid disease     SURGICAL HISTORY: Past Surgical History:  Procedure Laterality Date   CAROTID ENDARTERECTOMY Left 2004   CORONARY ARTERY BYPASS GRAFT  1994   [name]   TONSILLECTOMY AND ADENOIDECTOMY      SOCIAL HISTORY: Social History   Socioeconomic History   Marital status: Widowed    Spouse name: Not on file   Number of children: 4   Years of education: 16   Highest education level: Bachelor's degree (e.g., BA, AB, BS)   Occupational History   Occupation: retired  Tobacco Use   Smoking status: Former    Packs/day: 1.50    Years: 25.00    Pack years: 37.50    Types: Cigarettes    Quit date: 1978    Years since quitting: 44.6   Smokeless tobacco: Never  Vaping Use   Vaping Use: Never used  Substance and Sexual Activity   Alcohol use: Yes    Alcohol/week: 1.0 standard drink    Types: 1 Glasses of wine per week    Comment: socially    Drug use: No   Sexual activity: Not Currently  Other Topics Concern   Not on file  Social History Narrative   ** Merged History Encounter **       Widower. 4 children, 7 grandchildren, 1 great grandchild. 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    Social Determinants of Health   Financial Resource Strain: Not on file  Food Insecurity: Not on file  Transportation Needs: Not on file  Physical Activity: Not on file  Stress: Not on file  Social Connections: Not on file  Intimate Partner Violence: Not on file    FAMILY HISTORY: Family History  Problem Relation Age of Onset   Heart attack Mother    Heart disease Mother    Stroke Mother    Diabetes Father    Coronary artery disease Sister     ALLERGIES:  is allergic to heparin, patiromer, and sodium zirconium cyclosilicate.  MEDICATIONS:  Current Outpatient Medications  Medication Sig Dispense Refill   aspirin EC 81 MG tablet Take 81 mg by mouth at bedtime. Swallow whole.     atorvastatin (LIPITOR) 40 MG tablet TAKE 1 TABLET BY MOUTH  DAILY (Patient taking differently: Take 40 mg by mouth at bedtime.) 90 tablet 3   B-D ULTRAFINE III SHORT PEN 31G X 8 MM MISC USE AS INSTRUCTED TO INJECT INSULIN DAILY. 100 each 5   betamethasone valerate (VALISONE) 0.1 % cream APPLY TO AFFECTED AREA TWICE A DAY AS NEEDED 45 g 0   Blood Glucose Monitoring Suppl (ONETOUCH VERIO) w/Device KIT 1 Device by Does not apply route daily. Use as instructed to test blood sugar daily 1 kit  0   calcium carbonate (OSCAL) 1500 (600 Ca) MG TABS tablet Take 600 mg of elemental calcium by mouth at bedtime.     cholecalciferol (VITAMIN D3) 25 MCG (1000 UT) tablet Take 1,000 Units by mouth at bedtime.     diclofenac Sodium (VOLTAREN) 1 % GEL Apply 1 application topically daily as needed (pain).     epoetin alfa (EPOGEN,PROCRIT) 14239 UNIT/ML injection Inject 20,000 Units into the skin See admin instructions. Inject 20000 units subcutaneously  if hemoglobin <10 - checked monthly     ferrous sulfate 325 (65 FE) MG tablet Take 325-650 mg by mouth See admin instructions. Take 2 tablets (650 mg) by mouth every morning and 1 tablet (325 mg) at night  glimepiride (AMARYL) 4 MG tablet TAKE 1 TABLET BY MOUTH  DAILY WITH BREAKFAST (Patient taking differently: Take 4 mg by mouth daily with breakfast.) 120 tablet 2   glucose blood (ONETOUCH VERIO) test strip USE AS INSTRUCTED TO TEST BLOOD SUGAR twice DAILY 200 strip 2   Hydrocortisone (PROCTOSOL HC RE) Place 1 Dose rectally daily as needed (hemorrhoids/itching).     insulin glargine (LANTUS SOLOSTAR) 100 UNIT/ML Solostar Pen Inject 2 Units into the skin at bedtime. 15 mL 0   Insulin Pen Needle 29G X 12.7MM MISC BD Ultrafine Pen Needle Use as instructed to inject insulin daily 300 each 1   Lancets (ONETOUCH DELICA PLUS [id number]) MISC USE AS INSTRUCT TO TEST BLOOD SUGAR twice DAILY 200 each 2   levothyroxine (SYNTHROID) 175 MCG tablet Take 175 mcg by mouth daily before breakfast.     Multiple Vitamin (MULTIVITAMIN WITH MINERALS) TABS tablet Take 1 tablet by mouth daily.     Multiple Vitamins-Minerals (PRESERVISION AREDS 2 PO) Take by mouth.     nitroGLYCERIN (NITROSTAT) 0.4 MG SL tablet Place 1 tablet (0.4 mg total) under the tongue every 5 (five) minutes as needed for chest pain. 25 tablet 0   Omega-3 Fatty Acids (FISH OIL) 1200 MG CAPS Take 2,400 mg by mouth 2 (two) times daily.     pantoprazole (PROTONIX) 20 MG tablet Take 1 tablet (20 mg total) by  mouth daily. For heartburn 90 tablet 3   Probiotic Product (ALIGN PO) Take 1 tablet by mouth daily.     Psyllium (METAMUCIL PO) Take by mouth See admin instructions. Mix one heaping teaspoonful in apple juice and drink daily     ranolazine (RANEXA) 1000 MG SR tablet Take 1 tablet (1,000 mg total) by mouth 2 (two) times daily. 180 tablet 3   tamsulosin (FLOMAX) 0.4 MG CAPS capsule Take 1 capsule (0.4 mg total) by mouth daily after supper. 90 capsule 3   timolol (TIMOPTIC) 0.5 % ophthalmic solution Place 1 drop into both eyes daily.  11   No current facility-administered medications for this visit.     PHYSICAL EXAMINATION: ECOG PERFORMANCE STATUS: 1 - Symptomatic but completely ambulatory Vitals:   10/20/20 1419  BP: (!) 164/63  Pulse: 70  Resp: 16  Temp: (!) 96.8 F (36 C)   Filed Weights   10/20/20 1419  Weight: 155 lb 11.2 oz (70.6 kg)    Physical Exam Constitutional:      Appearance: Normal appearance.  HENT:     Head: Normocephalic and atraumatic.  Eyes:     Pupils: Pupils are equal, round, and reactive to light.  Cardiovascular:     Rate and Rhythm: Normal rate and regular rhythm.     Heart sounds: Normal heart sounds. No murmur heard. Pulmonary:     Effort: Pulmonary effort is normal.     Breath sounds: Normal breath sounds. No wheezing.  Abdominal:     General: Bowel sounds are normal. There is no distension.     Palpations: Abdomen is soft.     Tenderness: There is no abdominal tenderness.  Musculoskeletal:        General: Normal range of motion.     Cervical back: Normal range of motion.  Skin:    General: Skin is warm and dry.     Findings: No rash.  Neurological:     Mental Status: He is alert and oriented to person, place, and time.     Gait: Gait is intact.  Psychiatric:  Mood and Affect: Mood and affect normal.        Cognition and Memory: Memory normal.        Judgment: Judgment normal.     LABORATORY DATA:  I have reviewed the data as  listed Lab Results  Component Value Date   WBC 5.7 10/20/2020   HGB 9.4 (L) 10/20/2020   HCT 28.6 (L) 10/20/2020   MCV 102.9 (H) 10/20/2020   PLT 141 (L) 10/20/2020   Recent Labs    02/13/20 1557 02/14/20 0422 06/02/20 1248 06/03/20 0102 06/04/20 0027 06/05/20 0404 07/17/20 1555  NA 134*   < > 131* 126* 131* 135  --   K 5.1   < > 4.8 4.6 4.3 4.3  --   CL 97*   < > 97* 96* 99 102  --   CO2 29   < > 24 21* 25 24  --   GLUCOSE 334*   < > 161* 263* 217* 143*  --   BUN 48*   < > 45* 41* 35* 35*  --   CREATININE 2.26*   < > 1.92* 1.80* 1.64* 1.75* 1.65*  CALCIUM 9.7   < > 9.2 8.7* 8.7* 9.0  --   GFRNONAA 27*   < > 32* 35* 39* 36*  --   PROT 7.6  --  7.2  --   --   --   --   ALBUMIN 3.6  --  2.5*  --   --   --   --   AST 29  --  54*  --   --   --   --   ALT 23  --  42  --   --   --   --   ALKPHOS 76  --  97  --   --   --   --   BILITOT 0.8  --  0.6  --   --   --   --   BILIDIR <0.1  --   --   --   --   --   --   IBILI NOT CALCULATED  --   --   --   --   --   --    < > = values in this interval not displayed.     Iron/TIBC/Ferritin/ %Sat    Component Value Date/Time   IRON 86 06/30/2020 1305   TIBC 284 06/30/2020 1305   FERRITIN 792 (H) 06/30/2020 1305   IRONPCTSAT 30 06/30/2020 1305   SPEP negative for M spike.   RADIOGRAPHIC STUDIES:  FINDINGS: The lungs are clear and negative for focal airspace consolidation, pulmonary edema or suspicious pulmonary nodule. No pleural effusion or pneumothorax. Cardiac and mediastinal contours are within normal limits. Patient is status post median sternotomy with evidence of prior CABG including LIMA bypass. Trace linear scarring versus atelectasis in the lingula. No acute fracture or lytic or blastic osseous lesions. The visualized upper abdominal bowel gas pattern is unremarkable.   IMPRESSION: No active cardiopulmonary disease.  ASSESSMENT & PLAN:  1. Macrocytosis   2. Anemia of chronic renal failure, stage 3 (moderate),  unspecified whether stage 3a or 3b CKD ([place])    Anemia secondary to CKD- Patient receives Retacrit for hemoglobin less than 10.  He typically returns to clinic every 4 weeks for lab work and possible [place].  Lab work from today shows a hemoglobin of 9.4.  Proceed with Retacrit.  Return to clinic in 4, 8 and 12 weeks for  H&H plus or minus Retacrit if hemoglobin less than 10.  Follow-up with Dr. Tasia Catchings in 4 months with lab work plus or minus Retacrit.  Macrocytosis- Work-up to date showed normal folate and vitamin B12 level.  MCV is 102.9 today.  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  Continue to monitor for now.  I spent 15 minutes dedicated to the care of this patient (face-to-face and non-face-to-face) on the date of the encounter to include what is described in the assessment and plan.  Faythe Casa, NP 10/20/2020 2:53 PM

## 2020-10-20 NOTE — Addendum Note (Signed)
Addended by: Vanice Sarah on: 10/20/2020 03:21 PM   Modules accepted: Orders

## 2020-10-20 NOTE — Progress Notes (Signed)
Patient denies new problems/concerns today.   °

## 2020-10-24 ENCOUNTER — Ambulatory Visit (INDEPENDENT_AMBULATORY_CARE_PROVIDER_SITE_OTHER): Payer: Medicare Other

## 2020-10-24 ENCOUNTER — Other Ambulatory Visit: Payer: Self-pay

## 2020-10-24 ENCOUNTER — Telehealth: Payer: Self-pay

## 2020-10-24 DIAGNOSIS — Z111 Encounter for screening for respiratory tuberculosis: Secondary | ICD-10-CM

## 2020-10-24 NOTE — Telephone Encounter (Signed)
Where is he planning on going? Facility name and type of facility.

## 2020-10-24 NOTE — Telephone Encounter (Signed)
Patient came in for PPD test. Has FL2 that he would like pick on Friday when he comes to have read have put in your box for review.

## 2020-10-25 NOTE — Progress Notes (Signed)
PPD Placement note Kollen Armenti, 87 y.o. male is here today for placement of PPD test Reason for PPD test: Facility placement  Pt taken PPD test before: no Verified in allergy area and with patient that they are not allergic to the products PPD is made of (Phenol or Tween). Yes Is patient taking any oral or IV steroid medication now or have they taken it in the last month? no Has the patient ever received the BCG vaccine?: no Has the patient been in recent contact with anyone known or suspected of having active TB disease?: no      Date of exposure (if applicable): N/A       Name of person they were exposed to (if applicable):N/A Patient's Country of origin?: [place]  O: Alert and oriented in NAD. P:  PPD placed on 10/24/20.  Patient advised to return for reading within 48-72 hours.   Patient to return at 215pm on Friday, 10/27/20.

## 2020-10-26 NOTE — Telephone Encounter (Signed)
Yes, see below, written on 08/23.

## 2020-10-26 NOTE — Telephone Encounter (Signed)
Did you have further questions for him?

## 2020-10-27 LAB — TB SKIN TEST
Induration: 0 mm
TB Skin Test: NEGATIVE

## 2020-10-27 NOTE — Telephone Encounter (Signed)
Patient came in ppw given he is moving to assisted living and would like for you to continue to be his PCP. He will be at [facility] assisted living

## 2020-10-27 NOTE — Telephone Encounter (Signed)
Noted and completed.

## 2020-11-03 ENCOUNTER — Other Ambulatory Visit: Payer: Self-pay | Admitting: Primary Care

## 2020-11-03 DIAGNOSIS — I2510 Atherosclerotic heart disease of native coronary artery without angina pectoris: Secondary | ICD-10-CM

## 2020-11-03 DIAGNOSIS — E039 Hypothyroidism, unspecified: Secondary | ICD-10-CM

## 2020-11-03 DIAGNOSIS — E785 Hyperlipidemia, unspecified: Secondary | ICD-10-CM

## 2020-11-03 DIAGNOSIS — E119 Type 2 diabetes mellitus without complications: Secondary | ICD-10-CM

## 2020-11-08 ENCOUNTER — Other Ambulatory Visit: Payer: Self-pay

## 2020-11-09 MED ORDER — NITROGLYCERIN 0.4 MG SL SUBL: 0.4000 mg | SUBLINGUAL_TABLET | SUBLINGUAL | 0 refills | Status: AC | PRN

## 2020-11-17 ENCOUNTER — Inpatient Hospital Stay: Payer: Medicare Other | Attending: Oncology

## 2020-11-17 ENCOUNTER — Inpatient Hospital Stay: Payer: Medicare Other

## 2020-11-17 VITALS — BP 103/62 | HR 72

## 2020-11-17 DIAGNOSIS — I129 Hypertensive chronic kidney disease with stage 1 through stage 4 chronic kidney disease, or unspecified chronic kidney disease: Secondary | ICD-10-CM | POA: Diagnosis not present

## 2020-11-17 DIAGNOSIS — N183 Chronic kidney disease, stage 3 unspecified: Secondary | ICD-10-CM | POA: Diagnosis not present

## 2020-11-17 DIAGNOSIS — Z79899 Other long term (current) drug therapy: Secondary | ICD-10-CM | POA: Insufficient documentation

## 2020-11-17 DIAGNOSIS — D631 Anemia in chronic kidney disease: Secondary | ICD-10-CM

## 2020-11-17 DIAGNOSIS — D7589 Other specified diseases of blood and blood-forming organs: Secondary | ICD-10-CM

## 2020-11-17 LAB — HEMOGLOBIN AND HEMATOCRIT, BLOOD
HCT: 27.4 % — ABNORMAL LOW (ref 39.0–52.0)
Hemoglobin: 9 g/dL — ABNORMAL LOW (ref 13.0–17.0)

## 2020-11-17 MED ORDER — EPOETIN ALFA-EPBX 10000 UNIT/ML IJ SOLN
20000.0000 [IU] | Freq: Once | INTRAMUSCULAR | Status: AC
  Administered 2020-11-17: 20000 [IU] via SUBCUTANEOUS
  Filled 2020-11-17: qty 2

## 2020-11-18 ENCOUNTER — Inpatient Hospital Stay (HOSPITAL_COMMUNITY)
Admission: EM | Admit: 2020-11-18 | Discharge: 2020-12-02 | DRG: 193 | Disposition: E | Payer: Medicare Other | Attending: Family Medicine | Admitting: Family Medicine

## 2020-11-18 ENCOUNTER — Encounter (HOSPITAL_COMMUNITY): Payer: Self-pay

## 2020-11-18 ENCOUNTER — Other Ambulatory Visit: Payer: Self-pay

## 2020-11-18 ENCOUNTER — Emergency Department (HOSPITAL_COMMUNITY): Payer: Medicare Other

## 2020-11-18 DIAGNOSIS — I130 Hypertensive heart and chronic kidney disease with heart failure and stage 1 through stage 4 chronic kidney disease, or unspecified chronic kidney disease: Secondary | ICD-10-CM | POA: Diagnosis present

## 2020-11-18 DIAGNOSIS — I358 Other nonrheumatic aortic valve disorders: Secondary | ICD-10-CM | POA: Diagnosis present

## 2020-11-18 DIAGNOSIS — R001 Bradycardia, unspecified: Secondary | ICD-10-CM | POA: Diagnosis not present

## 2020-11-18 DIAGNOSIS — K219 Gastro-esophageal reflux disease without esophagitis: Secondary | ICD-10-CM | POA: Diagnosis present

## 2020-11-18 DIAGNOSIS — R079 Chest pain, unspecified: Secondary | ICD-10-CM | POA: Diagnosis not present

## 2020-11-18 DIAGNOSIS — Z7984 Long term (current) use of oral hypoglycemic drugs: Secondary | ICD-10-CM

## 2020-11-18 DIAGNOSIS — I959 Hypotension, unspecified: Secondary | ICD-10-CM | POA: Diagnosis not present

## 2020-11-18 DIAGNOSIS — I25118 Atherosclerotic heart disease of native coronary artery with other forms of angina pectoris: Secondary | ICD-10-CM | POA: Diagnosis present

## 2020-11-18 DIAGNOSIS — R0602 Shortness of breath: Secondary | ICD-10-CM

## 2020-11-18 DIAGNOSIS — E1122 Type 2 diabetes mellitus with diabetic chronic kidney disease: Secondary | ICD-10-CM | POA: Diagnosis present

## 2020-11-18 DIAGNOSIS — Z20822 Contact with and (suspected) exposure to covid-19: Secondary | ICD-10-CM | POA: Diagnosis present

## 2020-11-18 DIAGNOSIS — E119 Type 2 diabetes mellitus without complications: Secondary | ICD-10-CM

## 2020-11-18 DIAGNOSIS — I452 Bifascicular block: Secondary | ICD-10-CM | POA: Diagnosis present

## 2020-11-18 DIAGNOSIS — I5033 Acute on chronic diastolic (congestive) heart failure: Secondary | ICD-10-CM | POA: Diagnosis present

## 2020-11-18 DIAGNOSIS — N189 Chronic kidney disease, unspecified: Secondary | ICD-10-CM | POA: Diagnosis present

## 2020-11-18 DIAGNOSIS — I714 Abdominal aortic aneurysm, without rupture: Secondary | ICD-10-CM | POA: Diagnosis present

## 2020-11-18 DIAGNOSIS — N179 Acute kidney failure, unspecified: Secondary | ICD-10-CM | POA: Diagnosis present

## 2020-11-18 DIAGNOSIS — Z823 Family history of stroke: Secondary | ICD-10-CM

## 2020-11-18 DIAGNOSIS — J189 Pneumonia, unspecified organism: Secondary | ICD-10-CM | POA: Diagnosis not present

## 2020-11-18 DIAGNOSIS — E1151 Type 2 diabetes mellitus with diabetic peripheral angiopathy without gangrene: Secondary | ICD-10-CM | POA: Diagnosis present

## 2020-11-18 DIAGNOSIS — N1832 Chronic kidney disease, stage 3b: Secondary | ICD-10-CM | POA: Diagnosis present

## 2020-11-18 DIAGNOSIS — Z7982 Long term (current) use of aspirin: Secondary | ICD-10-CM

## 2020-11-18 DIAGNOSIS — I272 Pulmonary hypertension, unspecified: Secondary | ICD-10-CM | POA: Diagnosis present

## 2020-11-18 DIAGNOSIS — J9601 Acute respiratory failure with hypoxia: Secondary | ICD-10-CM | POA: Diagnosis present

## 2020-11-18 DIAGNOSIS — Z951 Presence of aortocoronary bypass graft: Secondary | ICD-10-CM

## 2020-11-18 DIAGNOSIS — E039 Hypothyroidism, unspecified: Secondary | ICD-10-CM | POA: Diagnosis present

## 2020-11-18 DIAGNOSIS — Z794 Long term (current) use of insulin: Secondary | ICD-10-CM

## 2020-11-18 DIAGNOSIS — Z86711 Personal history of pulmonary embolism: Secondary | ICD-10-CM

## 2020-11-18 DIAGNOSIS — E079 Disorder of thyroid, unspecified: Secondary | ICD-10-CM | POA: Diagnosis present

## 2020-11-18 DIAGNOSIS — I214 Non-ST elevation (NSTEMI) myocardial infarction: Secondary | ICD-10-CM | POA: Diagnosis present

## 2020-11-18 DIAGNOSIS — Z79899 Other long term (current) drug therapy: Secondary | ICD-10-CM

## 2020-11-18 DIAGNOSIS — R0902 Hypoxemia: Secondary | ICD-10-CM

## 2020-11-18 DIAGNOSIS — Z8249 Family history of ischemic heart disease and other diseases of the circulatory system: Secondary | ICD-10-CM

## 2020-11-18 DIAGNOSIS — Z87891 Personal history of nicotine dependence: Secondary | ICD-10-CM

## 2020-11-18 DIAGNOSIS — R778 Other specified abnormalities of plasma proteins: Secondary | ICD-10-CM | POA: Diagnosis present

## 2020-11-18 DIAGNOSIS — D631 Anemia in chronic kidney disease: Secondary | ICD-10-CM | POA: Diagnosis present

## 2020-11-18 DIAGNOSIS — I472 Ventricular tachycardia: Secondary | ICD-10-CM | POA: Diagnosis present

## 2020-11-18 DIAGNOSIS — Z66 Do not resuscitate: Secondary | ICD-10-CM | POA: Diagnosis present

## 2020-11-18 DIAGNOSIS — I209 Angina pectoris, unspecified: Secondary | ICD-10-CM | POA: Diagnosis present

## 2020-11-18 DIAGNOSIS — A419 Sepsis, unspecified organism: Secondary | ICD-10-CM

## 2020-11-18 DIAGNOSIS — Z833 Family history of diabetes mellitus: Secondary | ICD-10-CM

## 2020-11-18 DIAGNOSIS — E785 Hyperlipidemia, unspecified: Secondary | ICD-10-CM | POA: Diagnosis present

## 2020-11-18 LAB — CBC
HCT: 26.4 % — ABNORMAL LOW (ref 39.0–52.0)
Hemoglobin: 8.5 g/dL — ABNORMAL LOW (ref 13.0–17.0)
MCH: 34 pg (ref 26.0–34.0)
MCHC: 32.2 g/dL (ref 30.0–36.0)
MCV: 105.6 fL — ABNORMAL HIGH (ref 80.0–100.0)
Platelets: 176 10*3/uL (ref 150–400)
RBC: 2.5 MIL/uL — ABNORMAL LOW (ref 4.22–5.81)
RDW: 14 % (ref 11.5–15.5)
WBC: 8.8 10*3/uL (ref 4.0–10.5)
nRBC: 0 % (ref 0.0–0.2)

## 2020-11-18 LAB — CBG MONITORING, ED: Glucose-Capillary: 250 mg/dL — ABNORMAL HIGH (ref 70–99)

## 2020-11-18 MED ORDER — LACTATED RINGERS IV BOLUS (SEPSIS)
1000.0000 mL | Freq: Once | INTRAVENOUS | Status: AC
  Administered 2020-11-18: 1000 mL via INTRAVENOUS

## 2020-11-18 MED ORDER — LACTATED RINGERS IV SOLN: INTRAVENOUS | Status: DC

## 2020-11-18 MED ORDER — SODIUM CHLORIDE 0.9 % IV SOLN
1.0000 g | Freq: Once | INTRAVENOUS | Status: AC
  Administered 2020-11-19: 1 g via INTRAVENOUS
  Filled 2020-11-18: qty 10

## 2020-11-18 MED ORDER — ACETAMINOPHEN 325 MG PO TABS
650.0000 mg | ORAL_TABLET | Freq: Once | ORAL | Status: AC
  Administered 2020-11-18: 650 mg via ORAL
  Filled 2020-11-18: qty 2

## 2020-11-18 MED ORDER — ASPIRIN 81 MG PO CHEW
324.0000 mg | CHEWABLE_TABLET | Freq: Once | ORAL | Status: DC
  Filled 2020-11-18: qty 4

## 2020-11-18 MED ORDER — SODIUM CHLORIDE 0.9 % IV SOLN
500.0000 mg | Freq: Once | INTRAVENOUS | Status: AC
  Administered 2020-11-19: 500 mg via INTRAVENOUS
  Filled 2020-11-18: qty 500

## 2020-11-18 MED ORDER — LACTATED RINGERS IV BOLUS (SEPSIS)
250.0000 mL | Freq: Once | INTRAVENOUS | Status: DC
Start: 2020-11-19 — End: 2020-11-19

## 2020-11-18 NOTE — ED Provider Notes (Signed)
[facility] EMERGENCY DEPARTMENT Provider Note   CSN: [id number] Arrival date & time: 11/22/2020  2231     History Chief Complaint  Patient presents with   Chest Pain   Shortness of Breath    Tommy [name] is a 87 y.o. male with a h/o peripheral arterial disease, hypertension, coronary artery disease status post remote CABG, carotid arterial disease status post left carotid endarterectomy, hyperlipidemia, stage IIIb chronic kidney disease, type 2 diabetes mellitus, chronic anemia, grade 1 diastolic dysfunction, aortic valve sclerosis, moderate pulmonary hypertension, bifascicular block, and mild abdominal aortic dilation who presents the emergency department by EMS from [facility] with a chief complaint of generalized weakness.  The patient reports that he has been having intermittent upper chest pain that he characterizes as burning for the last 5 days.  Episodes of pain have been more frequent at night.  He has awoken from sleep multiple times over the last few days due to the pain.  Pain will resolve when he takes his home nitro.  Patient denies chest pain at this time and last episode of chest pain was earlier today at noon.  Yesterday, he went to a doctor's appointment and after returning to [facility] developed nausea and vomiting.  Vomiting has persisted for more than 24 hours.  He has become increasingly weak.  He also reports worsening shortness of breath with exertion.  He called EMS tonight after he became so winded that he could not walk across the room without taking a break.  EMS reports that the patient had oxygen saturations in the upper 80s and low 90s on arrival and was placed on 4 L nasal cannula, but has been titrated to 2 L nasal cannula.  He was given 1 sublingual nitro and 324 mg of ASA in route.  Glucose noted to be greater than 300 in route.  He denies cough, abdominal pain, numbness, weakness, arm pain, neck or jaw pain, visual changes,  dizziness, lightheadedness, fever, or chills.  Cardiology: Dr. Sharolyn Douglas.     The history is provided by medical records and the patient. No language interpreter was used.      Past Medical History:  Diagnosis Date   AAA (abdominal aortic aneurysm) ([place])    a. 06/2019 Abd U/S: AAA 3.4cm; b. 09/2020 Abd U/S: AAA 3.6cm.   Anemia    Carotid artery occlusion    a. 2004 s/p L carotid endarterectomy; b. 02/2019 U/S: 1-39% bilat ICA stenosis. <50% bilat CCA stenosis; c. 02/2020 U/S: 50-69% RICA, s/p L CEA w/o hemodynamically signif narrowing.   CKD (chronic kidney disease) stage 3, GFR 30-59 ml/min (HCC)    Coronary artery disease    a. 1994 s/p CABG x 3, [place], [place] (LIMA->LAD, VG->dRCA, VG->unknown vessel); b. 02/2016 Cath: LM 99d, LAD 100ost, LCX 100ost, RCA 80ost, diffuse 85-31m. LIMA->LAD ok, VG->RCA ok. VG->unknown vessel 100.   Diabetes mellitus without complication (HCC)    Diastolic dysfunction    a. 02/2017 Echo: EF 55-65%, no rwma. Nl DD. Ao sclerosis w/o stenosis. Mod TR. Mild MR; b. 02/2020 Echo: EF 60-65%, no rwma, Gr2 DD, nl RV size/fxn.    Dyslipidemia    GERD (gastroesophageal reflux disease)    Hemorrhoids    Heparin induced thrombocytopenia (HCC)    Hypertension    Incontinence of feces 10/28/2019   Pre-syncope & Palpitations    a. 08/2019 [facility]: RSR, avg HR 59 (44-86). Rare PACs/PVCs. 2 atrial runs (max 8 beats). Triggered events = sinusr rhythm w/  PVCs; b. 03/2020 [facility]: RSR, avg HR 63 (35-115). Rare PACs/PVCs. NSVT x 2 (up to 5 beats/154 bpm). 13 atrial runs (max 15 beats; 141 bpm). Brief episodes of sinus brady and jxnl rhythm (min HR 31). Triggered events = RSR, PACs.   Pulmonary embolism [facility])    Thyroid disease     Patient Active Problem List   Diagnosis Date Noted   Community acquired pneumonia of right lower lobe of lung 11/19/2020   Acute respiratory failure with hypoxia ([place]) 11/19/2020   Ischemic chest pain ([place]) 11/19/2020   NSTEMI (non-ST elevated myocardial  infarction) ([place]) 11/19/2020   UTI (urinary tract infection) 06/03/2020   Acute lower UTI 34/19/3790   Complicated UTI (urinary tract infection) 06/02/2020   Pulmonary nodule 05/26/2020   Urinary retention 05/26/2020   Neck pain 05/26/2020   Arteriovenous fistula of right femoral vessels ([place]) 05/26/2020   Preventative health care 04/03/2020   Orthostatic lightheadedness 03/15/2020   Heparin induced thrombocytopenia ([place]) 02/14/2020   Lightheadedness 02/14/2020   Acute kidney injury superimposed on CKD 3b ([place]) 02/13/2020   Postural dizziness with presyncope 02/13/2020   Elevated troponin 02/13/2020   Varicose veins of both lower extremities 02/04/2020   Cerumen impaction 01/24/2020   Nocturia 01/13/2020   Trifascicular block 10/20/2019   PAD (peripheral artery disease) ([place]) 10/20/2019   Fall 08/04/2019   Generalized weakness 06/09/2019   First degree AV block 12/24/2018   Bilateral carotid artery stenosis 09/17/2017   Anemia of chronic disease 09/17/2017   Anemia of chronic renal failure, stage 3 (moderate) ([place]) 05/23/2017   Aortic valve sclerosis 02/04/2017   Carotid artery disease ([place]) 02/04/2017   H/O endarterectomy 02/04/2017   Hyperlipidemia LDL goal <70 02/04/2017   Essential hypertension 02/04/2017   GERD (gastroesophageal reflux disease) 12/10/2016   Stage 3 chronic kidney disease ([place]) 12/09/2016   Coronary artery disease of native artery of native heart with stable angina pectoris ([place]) 12/09/2016   Type 2 diabetes mellitus ([place]) 12/09/2016   Hypothyroidism 12/09/2016    Past Surgical History:  Procedure Laterality Date   CAROTID ENDARTERECTOMY Left 2004   CORONARY ARTERY BYPASS GRAFT  1994   [name]   TONSILLECTOMY AND ADENOIDECTOMY         Family History  Problem Relation Age of Onset   Heart attack Mother    Heart disease Mother    Stroke Mother    Diabetes Father    Coronary artery disease Sister     Social History   Tobacco Use    Smoking status: Former    Packs/day: 1.50    Years: 25.00    Pack years: 37.50    Types: Cigarettes    Quit date: 1978    Years since quitting: 44.7   Smokeless tobacco: Never  Vaping Use   Vaping Use: Never used  Substance Use Topics   Alcohol use: Yes    Alcohol/week: 1.0 standard drink    Types: 1 Glasses of wine per week    Comment: socially    Drug use: No    Home Medications Prior to Admission medications   Medication Sig Start Date End Date Taking? Authorizing Provider  aspirin EC 81 MG tablet Take 81 mg by mouth at bedtime. Swallow whole.   Yes [provider]  atorvastatin (LIPITOR) 40 MG tablet Take 1 tablet (40 mg total) by mouth daily. For cholesterol. Patient taking differently: Take 40 mg by mouth at bedtime. For cholesterol. 11/03/20  Yes Pleas Koch, NP  B-D  ULTRAFINE III SHORT PEN 31G X 8 MM MISC USE AS INSTRUCTED TO INJECT INSULIN DAILY. 03/24/20  Yes Pleas Koch, NP  betamethasone valerate (VALISONE) 0.1 % cream APPLY TO AFFECTED AREA TWICE A DAY AS NEEDED Patient taking differently: Apply 1 application topically daily. Right ankle 08/31/20  Yes Pleas Koch, NP  Blood Glucose Monitoring Suppl (ONETOUCH VERIO) w/Device KIT 1 Device by Does not apply route daily. Use as instructed to test blood sugar daily 03/31/20  Yes Pleas Koch, NP  calcium carbonate (OSCAL) 1500 (600 Ca) MG TABS tablet Take 600 mg of elemental calcium by mouth at bedtime.   Yes [provider]  cholecalciferol (VITAMIN D3) 25 MCG (1000 UT) tablet Take 1,000 Units by mouth at bedtime.   Yes [provider]  diclofenac Sodium (VOLTAREN) 1 % GEL Apply 1 application topically daily as needed (pain).   Yes [provider]  epoetin alfa (EPOGEN,PROCRIT) 29798 UNIT/ML injection Inject 20,000 Units into the skin See admin instructions. Inject 20000 units subcutaneously  if hemoglobin <10 - checked monthly   Yes [provider]  ferrous  sulfate 325 (65 FE) MG tablet Take 325-650 mg by mouth See admin instructions. Take 2 tablets (650 mg) by mouth every morning and 1 tablet (325 mg) at night   Yes [provider]  glimepiride (AMARYL) 4 MG tablet Take 1 tablet (4 mg total) by mouth daily with breakfast. For diabetes. 11/03/20  Yes Pleas Koch, NP  Hydrocortisone (PROCTOSOL HC RE) Place 1 Dose rectally daily as needed (hemorrhoids/itching).   Yes [provider]  insulin glargine (LANTUS SOLOSTAR) 100 UNIT/ML Solostar Pen Inject 2 Units into the skin at bedtime. Patient taking differently: Inject 5-6 Units into the skin at bedtime. 10/05/20  Yes Pleas Koch, NP  Insulin Pen Needle 29G X 12.7MM MISC BD Ultrafine Pen Needle Use as instructed to inject insulin daily 02/18/17  Yes Pleas Koch, NP  Lancets [facility] DELICA PLUS [id number]) MISC USE AS INSTRUCT TO TEST BLOOD SUGAR twice DAILY 09/13/20  Yes Pleas Koch, NP  levothyroxine (SYNTHROID) 175 MCG tablet Take 1 tablet by mouth every morning on an empty stomach with water only.  No food or other medications for 30 minutes. Patient taking differently: Take 175 mcg by mouth daily before breakfast. Take 1 tablet by mouth every morning on an empty stomach with water only.  No food or other medications for 30 minutes. 11/03/20  Yes Pleas Koch, NP  Multiple Vitamin (MULTIVITAMIN WITH MINERALS) TABS tablet Take 1 tablet by mouth daily.   Yes [provider]  Multiple Vitamins-Minerals (PRESERVISION AREDS 2 PO) Take 1 tablet by mouth in the morning and at bedtime.   Yes [provider]  nitroGLYCERIN (NITROSTAT) 0.4 MG SL tablet Place 1 tablet (0.4 mg total) under the tongue every 5 (five) minutes as needed for chest pain. 11/09/20  Yes Theora Gianotti, NP  Omega-3 Fatty Acids (FISH OIL) 1200 MG CAPS Take 2,400 mg by mouth 2 (two) times daily.   Yes [provider]  pantoprazole (PROTONIX) 20 MG tablet Take 1  tablet (20 mg total) by mouth daily. For heartburn 08/03/20  Yes Pleas Koch, NP  Probiotic Product (ALIGN PO) Take 1 capsule by mouth daily.   Yes [provider]  Psyllium (METAMUCIL PO) Take by mouth See admin instructions. Mix one heaping teaspoonful in apple juice and drink daily   Yes [provider]  ranolazine (RANEXA) 1000  MG SR tablet Take 1 tablet (1,000 mg total) by mouth 2 (two) times daily. 02/28/20  Yes End, Harrell Gave, MD  tamsulosin (FLOMAX) 0.4 MG CAPS capsule Take 1 capsule (0.4 mg total) by mouth daily after supper. 06/14/20 06/09/21 Yes McGowan, Larene Beach A, PA-C  timolol (TIMOPTIC) 0.5 % ophthalmic solution Place 1 drop into both eyes daily. 03/17/17  Yes [provider]  glucose blood (ONETOUCH VERIO) test strip USE AS INSTRUCTED TO TEST BLOOD SUGAR twice DAILY 09/13/20   Pleas Koch, NP    Allergies    Heparin, Patiromer, and Sodium zirconium cyclosilicate  Review of Systems   Review of Systems  Constitutional:  Positive for fever. Negative for appetite change, chills and fatigue.  HENT:  Negative for congestion and sore throat.   Respiratory:  Positive for cough and shortness of breath.   Cardiovascular:  Positive for chest pain. Negative for palpitations and leg swelling.  Gastrointestinal:  Positive for vomiting. Negative for abdominal pain, constipation, diarrhea and nausea.  Genitourinary:  Negative for dysuria, frequency and urgency.  Musculoskeletal:  Negative for back pain, myalgias, neck pain and neck stiffness.  Skin:  Negative for rash.  Allergic/Immunologic: Negative for immunocompromised state.  Neurological:  Positive for weakness. Negative for headaches.  Psychiatric/Behavioral:  Negative for confusion.    Physical Exam Updated Vital Signs BP 140/60   Pulse 78   Temp 97.9 F (36.6 C) (Oral)   Resp 18   Ht $R'5\' 10"'Kw$  (1.778 m)   Wt 70.3 kg   SpO2 99%   BMI 22.24 kg/m   Physical Exam Vitals and nursing note  reviewed.  Constitutional:      General: He is not in acute distress.    Appearance: He is well-developed. He is diaphoretic. He is not toxic-appearing.     Comments: Pleasant, elderly gentleman.  HENT:     Head: Normocephalic.  Eyes:     Conjunctiva/sclera: Conjunctivae normal.  Cardiovascular:     Rate and Rhythm: Normal rate and regular rhythm.     Pulses: Normal pulses.     Heart sounds: Normal heart sounds. No murmur heard.   No friction rub. No gallop.  Pulmonary:     Effort: Pulmonary effort is normal. No respiratory distress.     Breath sounds: No stridor. Wheezing present. No rhonchi or rales.  Chest:     Chest wall: No tenderness.  Abdominal:     General: There is no distension.     Palpations: Abdomen is soft. There is no mass.     Tenderness: There is no abdominal tenderness. There is no right CVA tenderness, left CVA tenderness, guarding or rebound.     Hernia: No hernia is present.  Musculoskeletal:        General: No tenderness.     Cervical back: Neck supple.     Right lower leg: No edema.     Left lower leg: No edema.  Skin:    General: Skin is warm.     Capillary Refill: Capillary refill takes less than 2 seconds.     Coloration: Skin is not jaundiced or pale.  Neurological:     Mental Status: He is alert.     Comments: Alert and oriented x3.  No focal neurologic deficits.  Psychiatric:        Behavior: Behavior normal.    ED Results / Procedures / Treatments   Labs (all labs ordered are listed, but only abnormal results are displayed) Labs Reviewed  CBC - Abnormal;  Notable for the following components:      Result Value   RBC 2.50 (*)    Hemoglobin 8.5 (*)    HCT 26.4 (*)    MCV 105.6 (*)    All other components within normal limits  COMPREHENSIVE METABOLIC PANEL - Abnormal; Notable for the following components:   Sodium 133 (*)    Glucose, Bld 270 (*)    BUN 44 (*)    Creatinine, Ser 2.28 (*)    Albumin 2.8 (*)    AST 87 (*)    ALT 69 (*)     GFR, Estimated 26 (*)    All other components within normal limits  LACTIC ACID, PLASMA - Abnormal; Notable for the following components:   Lactic Acid, Venous 2.5 (*)    All other components within normal limits  PROTIME-INR - Abnormal; Notable for the following components:   Prothrombin Time 16.5 (*)    INR 1.3 (*)    All other components within normal limits  BRAIN NATRIURETIC PEPTIDE - Abnormal; Notable for the following components:   B Natriuretic Peptide 1,366.6 (*)    All other components within normal limits  CBC - Abnormal; Notable for the following components:   RBC 2.36 (*)    Hemoglobin 8.3 (*)    HCT 25.0 (*)    MCV 105.9 (*)    MCH 35.2 (*)    Platelets 143 (*)    All other components within normal limits  BASIC METABOLIC PANEL - Abnormal; Notable for the following components:   Sodium 133 (*)    CO2 21 (*)    Glucose, Bld 195 (*)    BUN 43 (*)    Creatinine, Ser 2.12 (*)    Calcium 8.5 (*)    GFR, Estimated 29 (*)    All other components within normal limits  CBG MONITORING, ED - Abnormal; Notable for the following components:   Glucose-Capillary 250 (*)    All other components within normal limits  CBG MONITORING, ED - Abnormal; Notable for the following components:   Glucose-Capillary 184 (*)    All other components within normal limits  CBG MONITORING, ED - Abnormal; Notable for the following components:   Glucose-Capillary 164 (*)    All other components within normal limits  CBG MONITORING, ED - Abnormal; Notable for the following components:   Glucose-Capillary 118 (*)    All other components within normal limits  TROPONIN I (HIGH SENSITIVITY) - Abnormal; Notable for the following components:   Troponin I (High Sensitivity) 3,464 (*)    All other components within normal limits  RESP PANEL BY RT-PCR (FLU A&B, COVID) ARPGX2  CULTURE, BLOOD (ROUTINE X 2)  CULTURE, BLOOD (ROUTINE X 2)  URINE CULTURE  RESPIRATORY PANEL BY PCR  MAGNESIUM  APTT   LACTIC ACID, PLASMA  URINALYSIS, ROUTINE W REFLEX MICROSCOPIC  LACTIC ACID, PLASMA  LACTIC ACID, PLASMA  STREP PNEUMONIAE URINARY ANTIGEN  LEGIONELLA PNEUMOPHILA SEROGP 1 UR AG  TROPONIN I (HIGH SENSITIVITY)    EKG None  Radiology DG Chest Portable 1 View  Result Date: 11/19/2020 CLINICAL DATA:  Shortness of breath, chest pain EXAM: PORTABLE CHEST 1 VIEW COMPARISON:  06/02/2020 FINDINGS: Unchanged cardiac and mediastinal contours. Airspace opacity in the right lower lung, most prominent in the infrahilar region. No pleural effusion or pneumothorax. No acute osseous abnormality. Status post median sternotomy with surgical clips overlying the cardiac silhouette. IMPRESSION: Opacities in the right lower lung, concerning for infection in the appropriate clinical setting.  Electronically Signed   By: Merilyn Baba M.D.   On: 11/19/2020 00:05    Procedures .Critical Care Performed by: Joanne Gavel, PA-C Authorized by: Joanne Gavel, PA-C   Critical care provider statement:    Critical care time (minutes):  40   Critical care time was exclusive of:  Separately billable procedures and treating other patients and teaching time   Critical care was necessary to treat or prevent imminent or life-threatening deterioration of the following conditions:  Sepsis   Critical care was time spent personally by me on the following activities:  Ordering and performing treatments and interventions, ordering and review of laboratory studies, ordering and review of radiographic studies, re-evaluation of patient's condition, review of old charts, obtaining history from patient or surrogate, examination of patient, evaluation of patient's response to treatment, pulse oximetry and development of treatment plan with patient or surrogate   I assumed direction of critical care for this patient from another provider in my specialty: no     Care discussed with: admitting provider     Medications Ordered in  ED Medications  atorvastatin (LIPITOR) tablet 40 mg (40 mg Oral Given 11/19/20 0227)  ferrous sulfate tablet 325 mg (325 mg Oral Given 11/19/20 0227)  ranolazine (RANEXA) 12 hr tablet 1,000 mg (has no administration in time range)  timolol (TIMOPTIC) 0.5 % ophthalmic solution 1 drop (has no administration in time range)  pantoprazole (PROTONIX) EC tablet 20 mg (has no administration in time range)  tamsulosin (FLOMAX) capsule 0.4 mg (has no administration in time range)  multivitamin with minerals tablet 1 tablet (has no administration in time range)  levothyroxine (SYNTHROID) tablet 175 mcg (175 mcg Oral Given 11/19/20 0702)  insulin glargine-yfgn (SEMGLEE) injection 3 Units (3 Units Subcutaneous Given 11/19/20 0227)  insulin aspart (novoLOG) injection 0-9 Units (0 Units Subcutaneous Not Given 11/19/20 0704)  ferrous sulfate tablet 650 mg (has no administration in time range)  cefTRIAXone (ROCEPHIN) 2 g in sodium chloride 0.9 % 100 mL IVPB (has no administration in time range)  azithromycin (ZITHROMAX) 500 mg in sodium chloride 0.9 % 250 mL IVPB (has no administration in time range)  acetaminophen (TYLENOL) tablet 650 mg (has no administration in time range)    Or  acetaminophen (TYLENOL) suppository 650 mg (has no administration in time range)  ondansetron (ZOFRAN) tablet 4 mg (has no administration in time range)    Or  ondansetron (ZOFRAN) injection 4 mg (has no administration in time range)  aspirin EC tablet 325 mg (has no administration in time range)  lactated ringers infusion ( Intravenous New Bag/Given 11/19/20 0331)  acetaminophen (TYLENOL) tablet 650 mg (650 mg Oral Given 11/02/2020 2333)  lactated ringers bolus 1,000 mL (0 mLs Intravenous Stopped 11/19/20 0126)    And  lactated ringers bolus 1,000 mL (0 mLs Intravenous Stopped 11/19/20 0126)  cefTRIAXone (ROCEPHIN) 1 g in sodium chloride 0.9 % 100 mL IVPB (0 g Intravenous Stopped 11/19/20 0201)  azithromycin (ZITHROMAX) 500 mg in sodium  chloride 0.9 % 250 mL IVPB (0 mg Intravenous Stopped 11/19/20 0306)  ipratropium-albuterol (DUONEB) 0.5-2.5 (3) MG/3ML nebulizer solution 3 mL (3 mLs Nebulization Given 11/19/20 0226)  cefTRIAXone (ROCEPHIN) 1 g in sodium chloride 0.9 % 100 mL IVPB (0 g Intravenous Stopped 11/19/20 0306)    ED Course  I have reviewed the triage vital signs and the nursing notes.  Pertinent labs & imaging results that were available during my care of the patient were reviewed by me and considered  in my medical decision making (see chart for details).  Clinical Course as of 11/19/20 0746  Sun Nov 19, 2020  0109 Spoke with lab regarding delay and troponin result.  Reports that there is a malfunction with the equipment and the blood is being sent out to an alternate facility, which will cause a delay in the result. [MM]    Clinical Course User Index [MM] Johathon Overturf, Laymond Purser, PA-C   MDM Rules/Calculators/A&P                           87 year old male with a h/o peripheral arterial disease, hypertension, coronary artery disease status post remote CABG, carotid arterial disease status post left carotid endarterectomy, hyperlipidemia, stage IIIb chronic kidney disease, type 2 diabetes mellitus, chronic anemia, grade 1 diastolic dysfunction, aortic valve sclerosis, moderate pulmonary hypertension, bifascicular block, and mild abdominal aortic dilation who presents the emergency department breast weakness, exertional dyspnea, chest pain, and vomiting.  Febrile to 101.9 with rectal temp on arrival.  Oxygen saturation in the low 90s on 2 L nasal cannula.  EMS reported that the patient was hypoxic in the upper 80s on arrival.  He has mild tachypnea.  The patient was seen and independently evaluated by Dr. Maryan Rued, attending physician.  Given fever on arrival with hypoxia and tachypnea, concern for sepsis and code sepsis was initiated.  Labs and imaging of been reviewed and independently interpreted by me.  EKG with right  bundle branch block.  No changes from previous.  Initial lactate is normal at 2.5, which was treated with 1 L of IV fluids.  Patient has had no hypotension.  However, he has a history of CHF and BNP today was 1366.  We will hold for me to remove 30 cc/kg bolus as patient does not meet septic shock criteria.  Repeat lactate is normal after 1 L.  Will hold Lasix at this time since patient does meet sepsis criteria and does not appear overtly volume overloaded. No leukocytosis.  He does also have an AKI with a creatinine of 2.12.  Chest x-ray with opacities in the right lower lung concerning for pneumonia.  Patient was started on Rocephin and azithromycin.  The patient will require admission for sepsis and acute respiratory failure.  Dr. Alcario Drought has accepted the patient for admission.  The patient appears reasonably stabilized for admission considering the current resources, flow, and capabilities available in the ED at this time, and I doubt any other [facility] requiring further screening and/or treatment in the ED prior to admission.    Final Clinical Impression(s) / ED Diagnoses Final diagnoses:  Sepsis with acute hypoxic respiratory failure without septic shock, due to unspecified organism [facility])  Pneumonia of right lower lobe due to infectious organism    Rx / DC Orders ED Discharge Orders     None        Tayloranne Lekas A, PA-C 11/19/20 0746    Blanchie Dessert, MD December 09, 2020 1335

## 2020-11-18 NOTE — ED Triage Notes (Signed)
Pt from [facility] with ems c.o intermittent chest pain for the past 3 days along with exertional SOB. Room air sats high 80s low 90s. 4L [place] applied with ems with oxygen sats at 96%. Pt took Nitro at home with some relief. EMS gave 1 Nitro and 324 ASA en route. Pt arrives to ED a/o

## 2020-11-18 NOTE — Sepsis Progress Note (Signed)
Following per sepsis protocol   

## 2020-11-19 ENCOUNTER — Inpatient Hospital Stay (HOSPITAL_COMMUNITY): Payer: Medicare Other

## 2020-11-19 ENCOUNTER — Other Ambulatory Visit: Payer: Self-pay

## 2020-11-19 DIAGNOSIS — I209 Angina pectoris, unspecified: Secondary | ICD-10-CM | POA: Diagnosis not present

## 2020-11-19 DIAGNOSIS — D631 Anemia in chronic kidney disease: Secondary | ICD-10-CM | POA: Diagnosis present

## 2020-11-19 DIAGNOSIS — N1832 Chronic kidney disease, stage 3b: Secondary | ICD-10-CM | POA: Diagnosis present

## 2020-11-19 DIAGNOSIS — E1151 Type 2 diabetes mellitus with diabetic peripheral angiopathy without gangrene: Secondary | ICD-10-CM | POA: Diagnosis present

## 2020-11-19 DIAGNOSIS — J9601 Acute respiratory failure with hypoxia: Secondary | ICD-10-CM | POA: Diagnosis present

## 2020-11-19 DIAGNOSIS — I272 Pulmonary hypertension, unspecified: Secondary | ICD-10-CM | POA: Diagnosis present

## 2020-11-19 DIAGNOSIS — J189 Pneumonia, unspecified organism: Secondary | ICD-10-CM

## 2020-11-19 DIAGNOSIS — N179 Acute kidney failure, unspecified: Secondary | ICD-10-CM | POA: Diagnosis present

## 2020-11-19 DIAGNOSIS — R778 Other specified abnormalities of plasma proteins: Secondary | ICD-10-CM

## 2020-11-19 DIAGNOSIS — E1122 Type 2 diabetes mellitus with diabetic chronic kidney disease: Secondary | ICD-10-CM

## 2020-11-19 DIAGNOSIS — N189 Chronic kidney disease, unspecified: Secondary | ICD-10-CM | POA: Diagnosis not present

## 2020-11-19 DIAGNOSIS — R6520 Severe sepsis without septic shock: Secondary | ICD-10-CM

## 2020-11-19 DIAGNOSIS — N183 Chronic kidney disease, stage 3 unspecified: Secondary | ICD-10-CM

## 2020-11-19 DIAGNOSIS — I714 Abdominal aortic aneurysm, without rupture: Secondary | ICD-10-CM | POA: Diagnosis present

## 2020-11-19 DIAGNOSIS — I214 Non-ST elevation (NSTEMI) myocardial infarction: Secondary | ICD-10-CM | POA: Diagnosis present

## 2020-11-19 DIAGNOSIS — Z66 Do not resuscitate: Secondary | ICD-10-CM | POA: Diagnosis present

## 2020-11-19 DIAGNOSIS — Z794 Long term (current) use of insulin: Secondary | ICD-10-CM | POA: Diagnosis not present

## 2020-11-19 DIAGNOSIS — Z20822 Contact with and (suspected) exposure to covid-19: Secondary | ICD-10-CM | POA: Diagnosis present

## 2020-11-19 DIAGNOSIS — I959 Hypotension, unspecified: Secondary | ICD-10-CM | POA: Diagnosis not present

## 2020-11-19 DIAGNOSIS — E785 Hyperlipidemia, unspecified: Secondary | ICD-10-CM | POA: Diagnosis present

## 2020-11-19 DIAGNOSIS — Z7984 Long term (current) use of oral hypoglycemic drugs: Secondary | ICD-10-CM | POA: Diagnosis not present

## 2020-11-19 DIAGNOSIS — I5033 Acute on chronic diastolic (congestive) heart failure: Secondary | ICD-10-CM | POA: Diagnosis present

## 2020-11-19 DIAGNOSIS — I452 Bifascicular block: Secondary | ICD-10-CM | POA: Diagnosis present

## 2020-11-19 DIAGNOSIS — I472 Ventricular tachycardia: Secondary | ICD-10-CM | POA: Diagnosis present

## 2020-11-19 DIAGNOSIS — R079 Chest pain, unspecified: Secondary | ICD-10-CM | POA: Diagnosis present

## 2020-11-19 DIAGNOSIS — Z7982 Long term (current) use of aspirin: Secondary | ICD-10-CM | POA: Diagnosis not present

## 2020-11-19 DIAGNOSIS — I130 Hypertensive heart and chronic kidney disease with heart failure and stage 1 through stage 4 chronic kidney disease, or unspecified chronic kidney disease: Secondary | ICD-10-CM | POA: Diagnosis present

## 2020-11-19 DIAGNOSIS — I25118 Atherosclerotic heart disease of native coronary artery with other forms of angina pectoris: Secondary | ICD-10-CM | POA: Diagnosis not present

## 2020-11-19 DIAGNOSIS — A419 Sepsis, unspecified organism: Secondary | ICD-10-CM | POA: Diagnosis not present

## 2020-11-19 DIAGNOSIS — I358 Other nonrheumatic aortic valve disorders: Secondary | ICD-10-CM | POA: Diagnosis present

## 2020-11-19 DIAGNOSIS — E039 Hypothyroidism, unspecified: Secondary | ICD-10-CM | POA: Diagnosis present

## 2020-11-19 DIAGNOSIS — Z79899 Other long term (current) drug therapy: Secondary | ICD-10-CM | POA: Diagnosis not present

## 2020-11-19 LAB — COMPREHENSIVE METABOLIC PANEL
ALT: 69 U/L — ABNORMAL HIGH (ref 0–44)
AST: 87 U/L — ABNORMAL HIGH (ref 15–41)
Albumin: 2.8 g/dL — ABNORMAL LOW (ref 3.5–5.0)
Alkaline Phosphatase: 82 U/L (ref 38–126)
Anion gap: 12 (ref 5–15)
BUN: 44 mg/dL — ABNORMAL HIGH (ref 8–23)
CO2: 22 mmol/L (ref 22–32)
Calcium: 8.9 mg/dL (ref 8.9–10.3)
Chloride: 99 mmol/L (ref 98–111)
Creatinine, Ser: 2.28 mg/dL — ABNORMAL HIGH (ref 0.61–1.24)
GFR, Estimated: 26 mL/min — ABNORMAL LOW (ref >60)
Glucose, Bld: 270 mg/dL — ABNORMAL HIGH (ref 70–99)
Potassium: 4.7 mmol/L (ref 3.5–5.1)
Sodium: 133 mmol/L — ABNORMAL LOW (ref 135–145)
Total Bilirubin: 0.5 mg/dL (ref 0.3–1.2)
Total Protein: 6.6 g/dL (ref 6.5–8.1)

## 2020-11-19 LAB — RESPIRATORY PANEL BY PCR

## 2020-11-19 LAB — STREP PNEUMONIAE URINARY ANTIGEN: Strep Pneumo Urinary Antigen: NEGATIVE

## 2020-11-19 LAB — BASIC METABOLIC PANEL
Anion gap: 11 (ref 5–15)
BUN: 43 mg/dL — ABNORMAL HIGH (ref 8–23)
CO2: 21 mmol/L — ABNORMAL LOW (ref 22–32)
Calcium: 8.5 mg/dL — ABNORMAL LOW (ref 8.9–10.3)
Chloride: 101 mmol/L (ref 98–111)
Creatinine, Ser: 2.12 mg/dL — ABNORMAL HIGH (ref 0.61–1.24)
GFR, Estimated: 29 mL/min — ABNORMAL LOW (ref >60)
Glucose, Bld: 195 mg/dL — ABNORMAL HIGH (ref 70–99)
Potassium: 4.5 mmol/L (ref 3.5–5.1)
Sodium: 133 mmol/L — ABNORMAL LOW (ref 135–145)

## 2020-11-19 LAB — RESP PANEL BY RT-PCR (FLU A&B, COVID) ARPGX2
Influenza A by PCR: NEGATIVE
Influenza B by PCR: NEGATIVE
SARS Coronavirus 2 by RT PCR: NEGATIVE

## 2020-11-19 LAB — URINALYSIS, ROUTINE W REFLEX MICROSCOPIC
Bilirubin Urine: NEGATIVE
Glucose, UA: NEGATIVE mg/dL
Hgb urine dipstick: NEGATIVE
Ketones, ur: NEGATIVE mg/dL
Leukocytes,Ua: NEGATIVE
Nitrite: NEGATIVE
Protein, ur: 100 mg/dL — AB
Specific Gravity, Urine: >1.03 — ABNORMAL HIGH (ref 1.005–1.030)
pH: 5.5 (ref 5.0–8.0)

## 2020-11-19 LAB — URINALYSIS, MICROSCOPIC (REFLEX): Bacteria, UA: NONE SEEN

## 2020-11-19 LAB — BRAIN NATRIURETIC PEPTIDE
B Natriuretic Peptide: 1366.6 pg/mL — ABNORMAL HIGH (ref 0.0–100.0)
B Natriuretic Peptide: 1421.5 pg/mL — ABNORMAL HIGH (ref 0.0–100.0)

## 2020-11-19 LAB — GLUCOSE, CAPILLARY: Glucose-Capillary: 194 mg/dL — ABNORMAL HIGH (ref 70–99)

## 2020-11-19 LAB — CBC
HCT: 25 % — ABNORMAL LOW (ref 39.0–52.0)
Hemoglobin: 8.3 g/dL — ABNORMAL LOW (ref 13.0–17.0)
MCH: 35.2 pg — ABNORMAL HIGH (ref 26.0–34.0)
MCHC: 33.2 g/dL (ref 30.0–36.0)
MCV: 105.9 fL — ABNORMAL HIGH (ref 80.0–100.0)
Platelets: 143 10*3/uL — ABNORMAL LOW (ref 150–400)
RBC: 2.36 MIL/uL — ABNORMAL LOW (ref 4.22–5.81)
RDW: 14 % (ref 11.5–15.5)
WBC: 8.3 10*3/uL (ref 4.0–10.5)
nRBC: 0 % (ref 0.0–0.2)

## 2020-11-19 LAB — CBG MONITORING, ED
Glucose-Capillary: 184 mg/dL — ABNORMAL HIGH (ref 70–99)
Glucose-Capillary: 164 mg/dL — ABNORMAL HIGH (ref 70–99)
Glucose-Capillary: 118 mg/dL — ABNORMAL HIGH (ref 70–99)
Glucose-Capillary: 174 mg/dL — ABNORMAL HIGH (ref 70–99)
Glucose-Capillary: 180 mg/dL — ABNORMAL HIGH (ref 70–99)

## 2020-11-19 LAB — PROTIME-INR
INR: 1.3 — ABNORMAL HIGH (ref 0.8–1.2)
Prothrombin Time: 16.5 seconds — ABNORMAL HIGH (ref 11.4–15.2)

## 2020-11-19 LAB — LACTIC ACID, PLASMA
Lactic Acid, Venous: 1.6 mmol/L (ref 0.5–1.9)
Lactic Acid, Venous: 2.5 mmol/L (ref 0.5–1.9)

## 2020-11-19 LAB — MAGNESIUM: Magnesium: 1.9 mg/dL (ref 1.7–2.4)

## 2020-11-19 LAB — APTT: aPTT: 35 seconds (ref 24–36)

## 2020-11-19 LAB — TROPONIN I (HIGH SENSITIVITY): Troponin I (High Sensitivity): 3464 ng/L (ref <18)

## 2020-11-19 MED ORDER — ATORVASTATIN CALCIUM 40 MG PO TABS
40.0000 mg | ORAL_TABLET | Freq: Every day | ORAL | Status: DC
  Administered 2020-11-19 – 2020-11-20 (×3): 40 mg via ORAL
  Filled 2020-11-19 (×3): qty 1

## 2020-11-19 MED ORDER — ONDANSETRON HCL 4 MG PO TABS: 4.0000 mg | ORAL_TABLET | Freq: Four times a day (QID) | ORAL | Status: DC | PRN

## 2020-11-19 MED ORDER — INSULIN GLARGINE-YFGN 100 UNIT/ML ~~LOC~~ SOLN
3.0000 [IU] | Freq: Every day | SUBCUTANEOUS | Status: DC
  Administered 2020-11-19 – 2020-11-20 (×3): 3 [IU] via SUBCUTANEOUS
  Filled 2020-11-19 (×4): qty 0.03

## 2020-11-19 MED ORDER — FUROSEMIDE 10 MG/ML IJ SOLN
40.0000 mg | Freq: Once | INTRAMUSCULAR | Status: DC
  Filled 2020-11-19: qty 4

## 2020-11-19 MED ORDER — SODIUM CHLORIDE 0.9 % IV SOLN: INTRAVENOUS | Status: DC

## 2020-11-19 MED ORDER — TIMOLOL MALEATE 0.5 % OP SOLN
1.0000 [drp] | Freq: Every day | OPHTHALMIC | Status: DC
  Administered 2020-11-19 – 2020-11-20 (×2): 1 [drp] via OPHTHALMIC
  Filled 2020-11-19: qty 5

## 2020-11-19 MED ORDER — SODIUM CHLORIDE 0.9 % IV SOLN
2.0000 g | INTRAVENOUS | Status: DC
  Administered 2020-11-19 – 2020-11-20 (×2): 2 g via INTRAVENOUS
  Filled 2020-11-19 (×2): qty 20

## 2020-11-19 MED ORDER — PANTOPRAZOLE SODIUM 20 MG PO TBEC
20.0000 mg | DELAYED_RELEASE_TABLET | Freq: Every day | ORAL | Status: DC
  Administered 2020-11-19 – 2020-11-20 (×2): 20 mg via ORAL
  Filled 2020-11-19 (×2): qty 1

## 2020-11-19 MED ORDER — TAMSULOSIN HCL 0.4 MG PO CAPS
0.4000 mg | ORAL_CAPSULE | Freq: Every day | ORAL | Status: DC
  Administered 2020-11-19 – 2020-11-20 (×2): 0.4 mg via ORAL
  Filled 2020-11-19 (×2): qty 1

## 2020-11-19 MED ORDER — FERROUS SULFATE 325 (65 FE) MG PO TABS
650.0000 mg | ORAL_TABLET | Freq: Every day | ORAL | Status: DC
  Administered 2020-11-19 – 2020-11-20 (×2): 650 mg via ORAL
  Filled 2020-11-19 (×2): qty 2

## 2020-11-19 MED ORDER — IPRATROPIUM-ALBUTEROL 0.5-2.5 (3) MG/3ML IN SOLN
3.0000 mL | Freq: Once | RESPIRATORY_TRACT | Status: AC
  Administered 2020-11-19: 3 mL via RESPIRATORY_TRACT
  Filled 2020-11-19: qty 3

## 2020-11-19 MED ORDER — SODIUM CHLORIDE 0.9 % IV SOLN
1.0000 g | Freq: Once | INTRAVENOUS | Status: AC
  Administered 2020-11-19: 1 g via INTRAVENOUS

## 2020-11-19 MED ORDER — ACETAMINOPHEN 650 MG RE SUPP: 650.0000 mg | Freq: Four times a day (QID) | RECTAL | Status: DC | PRN

## 2020-11-19 MED ORDER — VITAMIN D 25 MCG (1000 UNIT) PO TABS: 1000.0000 [IU] | ORAL_TABLET | Freq: Every day | ORAL | Status: DC

## 2020-11-19 MED ORDER — ASPIRIN EC 81 MG PO TBEC
81.0000 mg | DELAYED_RELEASE_TABLET | Freq: Every day | ORAL | Status: DC
  Filled 2020-11-19: qty 1

## 2020-11-19 MED ORDER — RANOLAZINE ER 500 MG PO TB12
1000.0000 mg | ORAL_TABLET | Freq: Two times a day (BID) | ORAL | Status: DC
  Administered 2020-11-19 – 2020-11-20 (×4): 1000 mg via ORAL
  Filled 2020-11-19 (×5): qty 2

## 2020-11-19 MED ORDER — INSULIN ASPART 100 UNIT/ML IJ SOLN
0.0000 [IU] | INTRAMUSCULAR | Status: DC
  Administered 2020-11-19 (×2): 2 [IU] via SUBCUTANEOUS
  Administered 2020-11-19: 3 [IU] via SUBCUTANEOUS
  Administered 2020-11-19 – 2020-11-20 (×3): 2 [IU] via SUBCUTANEOUS
  Administered 2020-11-20 – 2020-11-21 (×4): 3 [IU] via SUBCUTANEOUS

## 2020-11-19 MED ORDER — ONDANSETRON HCL 4 MG/2ML IJ SOLN: 4.0000 mg | Freq: Four times a day (QID) | INTRAMUSCULAR | Status: DC | PRN

## 2020-11-19 MED ORDER — LEVOTHYROXINE SODIUM 75 MCG PO TABS
175.0000 ug | ORAL_TABLET | Freq: Every day | ORAL | Status: DC
  Administered 2020-11-19 – 2020-11-20 (×2): 175 ug via ORAL
  Filled 2020-11-19 (×2): qty 1

## 2020-11-19 MED ORDER — LACTATED RINGERS IV SOLN: INTRAVENOUS | Status: DC

## 2020-11-19 MED ORDER — SODIUM CHLORIDE 0.9 % IV SOLN
500.0000 mg | INTRAVENOUS | Status: DC
  Administered 2020-11-20 – 2020-11-21 (×2): 500 mg via INTRAVENOUS
  Filled 2020-11-19 (×3): qty 500

## 2020-11-19 MED ORDER — FUROSEMIDE 10 MG/ML IJ SOLN
20.0000 mg | Freq: Once | INTRAMUSCULAR | Status: AC
  Administered 2020-11-20: 20 mg via INTRAVENOUS
  Filled 2020-11-19: qty 2

## 2020-11-19 MED ORDER — ACETAMINOPHEN 325 MG PO TABS: 650.0000 mg | ORAL_TABLET | Freq: Four times a day (QID) | ORAL | Status: DC | PRN

## 2020-11-19 MED ORDER — ASPIRIN EC 325 MG PO TBEC
325.0000 mg | DELAYED_RELEASE_TABLET | Freq: Every day | ORAL | Status: DC
  Administered 2020-11-19 – 2020-11-20 (×2): 325 mg via ORAL
  Filled 2020-11-19 (×2): qty 1

## 2020-11-19 MED ORDER — FERROUS SULFATE 325 (65 FE) MG PO TABS
325.0000 mg | ORAL_TABLET | Freq: Every day | ORAL | Status: DC
  Administered 2020-11-19 – 2020-11-20 (×3): 325 mg via ORAL
  Filled 2020-11-19 (×3): qty 1

## 2020-11-19 MED ORDER — ADULT MULTIVITAMIN W/MINERALS CH
1.0000 | ORAL_TABLET | Freq: Every day | ORAL | Status: DC
  Administered 2020-11-19 – 2020-11-20 (×2): 1 via ORAL
  Filled 2020-11-19 (×2): qty 1

## 2020-11-19 MED ORDER — INFLUENZA VAC A&B SA ADJ QUAD 0.5 ML IM PRSY
0.5000 mL | PREFILLED_SYRINGE | INTRAMUSCULAR | Status: DC
  Filled 2020-11-19: qty 0.5

## 2020-11-19 MED ORDER — IPRATROPIUM-ALBUTEROL 0.5-2.5 (3) MG/3ML IN SOLN
3.0000 mL | Freq: Four times a day (QID) | RESPIRATORY_TRACT | Status: DC
  Administered 2020-11-19 – 2020-11-20 (×6): 3 mL via RESPIRATORY_TRACT
  Filled 2020-11-19 (×7): qty 3

## 2020-11-19 MED ORDER — CALCIUM CARBONATE 1500 (600 CA) MG PO TABS: 600.0000 mg | ORAL_TABLET | Freq: Every day | ORAL | Status: DC

## 2020-11-19 NOTE — ED Notes (Signed)
Pt calling his son to update him on his status.

## 2020-11-19 NOTE — ED Notes (Signed)
Attempted to call report to unit; per charge RN, assigned nurse is still receiving report and will call back shortly.

## 2020-11-19 NOTE — Progress Notes (Signed)
Subjective: Patient admitted this morning, see detailed H&P by Dr. Alcario Drought 87 year old male with medical history of CAD s/p CABG, carotid artery disease s/p CEA, AAA(3.6 cm in July), hypertension, diabetes mellitus type 2, prior pulmonary embolism not on anticoagulation, history of HIT, CKD stage IIIb presented to ED with complaints of generalized weakness, intermittent chest pain.  Patient states that he went to doctor's appointment for a troponin injection due to anemia of CKD after that he developed nausea and vomiting.  This was associated with generalized weakness, worsening shortness of breath and dyspnea on exertion. EMS was called he was found to have O2 sats in upper 80s which improved on 2 L via nasal cannula.  Also patient had fever with temperature 101.9.  Chest x-ray showed right lower lobe pneumonia.  Patient started on Rocephin and Zithromax.  Vitals:   11/19/20 1115 11/19/20 1200  BP: (!) 146/72 133/67  Pulse: 90 74  Resp: (!) 22 17  Temp:    SpO2: 90% 94%   On exam Bilateral wheezing auscultated   A/P Right lower lobe community-acquired pneumonia -Started on pneumonia pathway -Continue Rocephin and Zithromax -Strep pneumo and Legionella urinary antigen obtained -COVID-19 and influenza negative  Acute hypoxemic respiratory failure -Secondary to above -Repeat chest x-ray performed this morning showed increasing patchy opacity in the right lower lobe concerning for worsening infection -Patchy opacities in the left base may reflect atelectasis or additional focus of infection  Elevated troponin/history of CAD s/p CABG -Presented with elevated troponin 3,464 -I called and discussed with cardiologist Dr. Debara Pickett, he reviewed the chart; and recommend only medical management -Not a candidate for left heart cath given advanced age, multiple comorbidities and worsening renal function  History of pulmonary embolism -Patient is not on chronic anticoagulation -History of HIT so  cannot use heparin or Lovenox for DVT prophylaxis -GFR less than 30 with AKI so Arixtra is contraindicated -Continue with SCDs for DVT prophylaxis  Diabetes mellitus type 2 -Continue Lantus 3 units subcu nightly -Continue sliding scale insulin with NovoLog  Acute kidney injury on CKD stage IIIb -Baseline creatinine around 1.75 as of 06/05/2020 -Presented with creatinine of 2.28, improved to 2.12 this morning -Continue with gentle IV hydration, follow BMP in am  Anemia of chronic disease -Likely from underlying CKD stage IIIb -Gets erythropoietin every 14 days -Last dose was 2 days ago -Hemoglobin is stable at 8.3.      [facility] [phone]

## 2020-11-19 NOTE — ED Notes (Signed)
This RN and [employer], found pt labored and desat in 80's on 4 L [place]. PT placed on 6 L [place]. PT 02 sat now 92-95%.  Pt with audible wheezing. MD [place] paged.

## 2020-11-19 NOTE — Significant Event (Signed)
Patient's nurse notified that patient is requiring 15 L nasal cannula high flow oxygen.  Short of breath.  I ordered a stat chest x-ray BNP Placed patient on BiPAP.  Discontinued IV fluids and Lasix 20 mg IV has been ordered.  EKG ordered.  We will continue to monitor closely.  Gean Birchwood

## 2020-11-19 NOTE — ED Notes (Signed)
Pt O2 sats 87%, placed on 4L [place]. O2 sat 96%

## 2020-11-19 NOTE — H&P (Signed)
History and Physical    Donold Marotto VZD:[id number] DOB: 1927/02/18 DOA: 11/29/2020  PCP: Pleas Koch, NP  Patient coming from: [facility]  I have personally briefly reviewed patient's old medical records in [place]  Chief Complaint: CP, SOB  HPI: Tommy [name] is a 87 y.o. male with medical history significant of CAD s/p CABG, carotid dz s/p CEA, AAA (3.6cm in July), HTN, DM2, prior PE not on AC, h/o HIT, CKD 3b.  Pt has chronic stable angina treated with Ranexa.  Pt presents to ED via EMS from [facility] with cc of generalized weakness, intermittent upper CP, burning quality, onset and intermittent over past 5 days.  CP episodes more frequent at night.  CP resolves with home NTG.  Last episode at noon today.  Yesterday went to doctors appointment (looks like for erythropoetin injection due to anemia of CKD).  On returning to [facility], developed N/V.  N/V has persisted for more than 24h.  Associated with increasing generalized weakness, worsening SOB and DOE.  Called EMS tonight after SOB got to point where he cant walk.  O2 sat upper 80s lower 90s initially, improved now on 2L via [place].  Got 1 sl NTG and 324 ASA en route to ED.  No cough, abd pain, neck or jaw pain.  ED Course: Tm 101.9, no other SIRS at this time.  Does have new O2 requirement: 2L to maintain sat of 92%.  Has AKI on CKD with creat 2.28 today up from 1.65 in May.  COVID neg  CXR demonstrates R lower lung PNA  BNP 1366  Trop 3464  Lactate 2.5  No hypotension.  Given 2L IVF bolus  Started on rocephin + azithro.   Review of Systems: As per HPI, otherwise all review of systems negative.  Past Medical History:  Diagnosis Date   AAA (abdominal aortic aneurysm) ([place])    a. 06/2019 Abd U/S: AAA 3.4cm; b. 09/2020 Abd U/S: AAA 3.6cm.   Anemia    Carotid artery occlusion    a. 2004 s/p L carotid endarterectomy; b. 02/2019 U/S: 1-39% bilat ICA stenosis. <50% bilat CCA  stenosis; c. 02/2020 U/S: 50-69% RICA, s/p L CEA w/o hemodynamically signif narrowing.   CKD (chronic kidney disease) stage 3, GFR 30-59 ml/min (HCC)    Coronary artery disease    a. 1994 s/p CABG x 3, [place] (LIMA->LAD, VG->dRCA, VG->unknown vessel); b. 02/2016 Cath: LM 99d, LAD 100ost, LCX 100ost, RCA 80ost, diffuse 85-105m. LIMA->LAD ok, VG->RCA ok. VG->unknown vessel 100.   Diabetes mellitus without complication (HCC)    Diastolic dysfunction    a. 02/2017 Echo: EF 55-65%, no rwma. Nl DD. Ao sclerosis w/o stenosis. Mod TR. Mild MR; b. 02/2020 Echo: EF 60-65%, no rwma, Gr2 DD, nl RV size/fxn.    Dyslipidemia    GERD (gastroesophageal reflux disease)    Hemorrhoids    Heparin induced thrombocytopenia (HCC)    Hypertension    Incontinence of feces 10/28/2019   Pre-syncope & Palpitations    a. 08/2019 [place]: RSR, avg HR 59 (44-86). Rare PACs/PVCs. 2 atrial runs (max 8 beats). Triggered events = sinusr rhythm w/ PVCs; b. 03/2020 [place]: RSR, avg HR 63 (35-115). Rare PACs/PVCs. NSVT x 2 (up to 5 beats/154 bpm). 13 atrial runs (max 15 beats; 141 bpm). Brief episodes of sinus brady and jxnl rhythm (min HR 31). Triggered events = RSR, PACs.   Pulmonary embolism ([place])    Thyroid disease     Past  Surgical History:  Procedure Laterality Date   CAROTID ENDARTERECTOMY Left 2004   CORONARY ARTERY BYPASS GRAFT  1994   [name]   TONSILLECTOMY AND ADENOIDECTOMY       reports that he quit smoking about 44 years ago. His smoking use included cigarettes. He has a 37.50 pack-year smoking history. He has never used smokeless tobacco. He reports current alcohol use of about 1.0 standard drink per week. He reports that he does not use drugs.  Allergies  Allergen Reactions   Heparin Other (See Comments)    "it caused low platelet count and made him have a clot"   Patiromer Other (See Comments)    Unknown reaction   Sodium Zirconium Cyclosilicate Other (See Comments)    Unknown reaction     Family History  Problem Relation Age of Onset   Heart attack Mother    Heart disease Mother    Stroke Mother    Diabetes Father    Coronary artery disease Sister      Prior to Admission medications   Medication Sig Start Date End Date Taking? Authorizing Provider  aspirin EC 81 MG tablet Take 81 mg by mouth at bedtime. Swallow whole.   Yes [provider]  atorvastatin (LIPITOR) 40 MG tablet Take 1 tablet (40 mg total) by mouth daily. For cholesterol. Patient taking differently: Take 40 mg by mouth at bedtime. For cholesterol. 11/03/20  Yes Pleas Koch, NP  B-D ULTRAFINE III SHORT PEN 31G X 8 MM MISC USE AS INSTRUCTED TO INJECT INSULIN DAILY. 03/24/20  Yes Pleas Koch, NP  betamethasone valerate (VALISONE) 0.1 % cream APPLY TO AFFECTED AREA TWICE A DAY AS NEEDED Patient taking differently: Apply 1 application topically daily. Right ankle 08/31/20  Yes Pleas Koch, NP  Blood Glucose Monitoring Suppl (ONETOUCH VERIO) w/Device KIT 1 Device by Does not apply route daily. Use as instructed to test blood sugar daily 03/31/20  Yes Pleas Koch, NP  calcium carbonate (OSCAL) 1500 (600 Ca) MG TABS tablet Take 600 mg of elemental calcium by mouth at bedtime.   Yes [provider]  cholecalciferol (VITAMIN D3) 25 MCG (1000 UT) tablet Take 1,000 Units by mouth at bedtime.   Yes [provider]  diclofenac Sodium (VOLTAREN) 1 % GEL Apply 1 application topically daily as needed (pain).   Yes [provider]  epoetin alfa (EPOGEN,PROCRIT) 01027 UNIT/ML injection Inject 20,000 Units into the skin See admin instructions. Inject 20000 units subcutaneously  if hemoglobin <10 - checked monthly   Yes [provider]  ferrous sulfate 325 (65 FE) MG tablet Take 325-650 mg by mouth See admin instructions. Take 2 tablets (650 mg) by mouth every morning and 1 tablet (325 mg) at night   Yes [provider]  glimepiride (AMARYL) 4 MG  tablet Take 1 tablet (4 mg total) by mouth daily with breakfast. For diabetes. 11/03/20  Yes Pleas Koch, NP  Hydrocortisone (PROCTOSOL HC RE) Place 1 Dose rectally daily as needed (hemorrhoids/itching).   Yes [provider]  insulin glargine (LANTUS SOLOSTAR) 100 UNIT/ML Solostar Pen Inject 2 Units into the skin at bedtime. Patient taking differently: Inject 5-6 Units into the skin at bedtime. 10/05/20  Yes Pleas Koch, NP  Insulin Pen Needle 29G X 12.7MM MISC BD Ultrafine Pen Needle Use as instructed to inject insulin daily 02/18/17  Yes Pleas Koch, NP  Lancets (ONETOUCH DELICA PLUS [id number]) MISC USE AS INSTRUCT TO TEST BLOOD SUGAR twice  DAILY 09/13/20  Yes Pleas Koch, NP  levothyroxine (SYNTHROID) 175 MCG tablet Take 1 tablet by mouth every morning on an empty stomach with water only.  No food or other medications for 30 minutes. Patient taking differently: Take 175 mcg by mouth daily before breakfast. Take 1 tablet by mouth every morning on an empty stomach with water only.  No food or other medications for 30 minutes. 11/03/20  Yes Pleas Koch, NP  Multiple Vitamin (MULTIVITAMIN WITH MINERALS) TABS tablet Take 1 tablet by mouth daily.   Yes [provider]  Multiple Vitamins-Minerals (PRESERVISION AREDS 2 PO) Take 1 tablet by mouth in the morning and at bedtime.   Yes [provider]  nitroGLYCERIN (NITROSTAT) 0.4 MG SL tablet Place 1 tablet (0.4 mg total) under the tongue every 5 (five) minutes as needed for chest pain. 11/09/20  Yes Theora Gianotti, NP  Omega-3 Fatty Acids (FISH OIL) 1200 MG CAPS Take 2,400 mg by mouth 2 (two) times daily.   Yes [provider]  pantoprazole (PROTONIX) 20 MG tablet Take 1 tablet (20 mg total) by mouth daily. For heartburn 08/03/20  Yes Pleas Koch, NP  Probiotic Product (ALIGN PO) Take 1 capsule by mouth daily.   Yes [provider]  Psyllium (METAMUCIL PO) Take by mouth  See admin instructions. Mix one heaping teaspoonful in apple juice and drink daily   Yes [provider]  ranolazine (RANEXA) 1000 MG SR tablet Take 1 tablet (1,000 mg total) by mouth 2 (two) times daily. 02/28/20  Yes End, Harrell Gave, MD  tamsulosin (FLOMAX) 0.4 MG CAPS capsule Take 1 capsule (0.4 mg total) by mouth daily after supper. 06/14/20 06/09/21 Yes McGowan, Larene Beach A, PA-C  timolol (TIMOPTIC) 0.5 % ophthalmic solution Place 1 drop into both eyes daily. 03/17/17  Yes [provider]  glucose blood (ONETOUCH VERIO) test strip USE AS INSTRUCTED TO TEST BLOOD SUGAR twice DAILY 09/13/20   Pleas Koch, NP    Physical Exam: Vitals:   11/19/20 0000 11/19/20 0015 11/19/20 0030 11/19/20 0100  BP: 134/71 133/67 127/81 116/61  Pulse: 84 83 81 78  Resp: $Remo'17 17 18 17  'bVPqJ$ Temp:      TempSrc:      SpO2: 92% 91% 90% 91%  Weight:      Height:        Constitutional: Mildly ill appearing Eyes: PERRL, lids and conjunctivae normal ENMT: Mucous membranes are moist. Posterior pharynx clear of any exudate or lesions.Normal dentition.  Neck: normal, supple, no masses, no thyromegaly Respiratory: Crackles right lung Cardiovascular: Regular rate and rhythm, no murmurs / rubs / gallops. No extremity edema. 2+ pedal pulses. No carotid bruits.  Abdomen: no tenderness, no masses palpated. No hepatosplenomegaly. Bowel sounds positive.  Musculoskeletal: no clubbing / cyanosis. No joint deformity upper and lower extremities. Good ROM, no contractures. Normal muscle tone. Skin: no rashes, lesions, ulcers. No induration Neurologic: CN 2-12 grossly intact. Sensation intact, DTR normal. Strength 5/5 in all 4.  Psychiatric: Normal judgment and insight. Alert and oriented x 3. Normal mood.    Labs on Admission: I have personally reviewed following labs and imaging studies  CBC: Recent Labs  Lab 11/17/20 1303 11/25/2020 2245  WBC  --  8.8  HGB 9.0* 8.5*  HCT 27.4* 26.4*  MCV  --  105.6*   PLT  --  102   Basic Metabolic Panel: Recent Labs  Lab 11/30/2020 2245  NA 133*  K 4.7  CL 99  CO2 22  GLUCOSE 270*  BUN 44*  CREATININE 2.28*  CALCIUM 8.9  MG 1.9   GFR: Estimated Creatinine Clearance: 21 mL/min (A) (by C-G formula based on SCr of 2.28 mg/dL (H)). Liver Function Tests: Recent Labs  Lab 11/05/2020 2245  AST 87*  ALT 69*  ALKPHOS 82  BILITOT 0.5  PROT 6.6  ALBUMIN 2.8*   No results for input(s): LIPASE, AMYLASE in the last 168 hours. No results for input(s): AMMONIA in the last 168 hours. Coagulation Profile: Recent Labs  Lab 11/06/2020 2357  INR 1.3*   Cardiac Enzymes: No results for input(s): CKTOTAL, CKMB, CKMBINDEX, TROPONINI in the last 168 hours. BNP (last 3 results) No results for input(s): PROBNP in the last 8760 hours. HbA1C: No results for input(s): HGBA1C in the last 72 hours. CBG: Recent Labs  Lab 11/17/2020 2256  GLUCAP 250*   Lipid Profile: No results for input(s): CHOL, HDL, LDLCALC, TRIG, CHOLHDL, LDLDIRECT in the last 72 hours. Thyroid Function Tests: No results for input(s): TSH, T4TOTAL, FREET4, T3FREE, THYROIDAB in the last 72 hours. Anemia Panel: No results for input(s): VITAMINB12, FOLATE, FERRITIN, TIBC, IRON, RETICCTPCT in the last 72 hours. Urine analysis:    Component Value Date/Time   COLORURINE YELLOW 06/02/2020 1319   APPEARANCEUR HAZY (A) 06/02/2020 1319   LABSPEC 1.011 06/02/2020 1319   PHURINE 9.0 (H) 06/02/2020 1319   GLUCOSEU NEGATIVE 06/02/2020 1319   HGBUR NEGATIVE 06/02/2020 1319   BILIRUBINUR neg 06/15/2020 [place] 06/02/2020 1319   PROTEINUR Positive (A) 06/15/2020 1424   PROTEINUR 100 (A) 06/02/2020 1319   UROBILINOGEN negative (A) 06/15/2020 1424   NITRITE neg 06/15/2020 1424   NITRITE NEGATIVE 06/02/2020 1319   LEUKOCYTESUR Negative 06/15/2020 1424   LEUKOCYTESUR LARGE (A) 06/02/2020 1319    Radiological Exams on Admission: No results found.  EKG: Independently  reviewed.  Assessment/Plan Principal Problem:   Community acquired pneumonia of right lower lobe of lung Active Problems:   Coronary artery disease of native artery of native heart with stable angina pectoris (HCC)   Type 2 diabetes mellitus (HCC)   Acute kidney injury superimposed on CKD 3b (HCC)   Elevated troponin   Acute respiratory failure with hypoxia (HCC)   Ischemic chest pain (HCC)   NSTEMI (non-ST elevated myocardial infarction) (HCC)    RLL CAP - PSI score 161 PNA pathway Rocephin + azithro S.pneumo and legionella urinary antigen RVP COVID and flu neg Patient has acute respiratory failure with hypoxia due to having a new oxygen requirement.  That is the patient has a PaO2 < 60 (pulse Ox < 90%) on room air. This is secondary to #1 above Question of sepsis - Technically only has 1 out of 4 SIRS criteria (fever) at time of admission Does have lactic acid of 2.5 Will trend Does have obvious source of infection (R lung PNA seen on X ray) IVF: got 30cc/kg (2L) bolus in ED, will put on 75cc/hr for now as maint (until tolerating POs) Does have organ dysfunction: acute respiratory failure, NSTEMI, and AKI AKI on CKD 3b - Due to above IVF bolus as above Strict intake and output Repeat BMP in AM Anemia - Slight acute component on mostly chronic anemia, HGB of 8.5 today is slightly down from his usual 9.7 Appears to be anemia of CKD based on prior oncology office notes (see office visit note from last month) Looks like oncology has him on erythropoietin Q14 days And looks like he just had this  given 2 days ago (so should be okay for a while from this perspective) Follow CBCs during admit. CP, CAD history, s/p CABG, NSTEMI - Does have chronic stable angina at baseline. First trop quite elevated, but suspect this may be demand ischemia. Will consult cards as a matter of standard of care No obvious STEMI, pt 91, DNR, has an obvious source for demand ischemia (pneumonia with  fever): Spoke with Dr. Juliane Lack, cards: Likely type 2 NSTEMI (demand ischemia) No indication for emergent LHC No indication for heparin gtt (cant anyhow due to h/o HIT) Treat as sepsis Get 2d echo in AM, call them back for formal consult if abnormal Cont ASA + Ranexa, got full dose ASA with EMS apparently Echo 02/2020 = preserved EF, grade 3 diastolic dysfunction, valves unremarkable. Trend repeat trops DM2 - Lantus 3u QHS Sensitive SSI Q4H H/o PE, need for DVT ppx - Not on chronic anticoagulation H/o HIT so cant use heparin or lovenox for DVT ppx during hospital stay GFR < 30 with AKI today so Arixtra is contraindicated Will put on SCDs + increase ASA to full dose daily for the moment for DVT ppx Will defer to day team if they want to consider low dose eliquis for DVT ppx in this patient.  DVT prophylaxis: SCDs + ASA for now (see discussion under h/o PE above) Code Status: DNR Family Communication: No family in room Disposition Plan: TBD, pending clinical improvement Consults called: Dr. Juliane Lack (cards) Admission status: Admit to inpatient  Severity of Illness: The appropriate patient status for this patient is INPATIENT. Inpatient status is judged to be reasonable and necessary in order to provide the required intensity of service to ensure the patient's safety. The patient's presenting symptoms, physical exam findings, and initial radiographic and laboratory data in the context of their chronic comorbidities is felt to place them at high risk for further clinical deterioration. Furthermore, it is not anticipated that the patient will be medically stable for discharge from the hospital within 2 midnights of admission. The following factors support the patient status of inpatient.   Patient has acute respiratory failure with hypoxia due to having a new oxygen requirement.  That is the patient has a PaO2 < 60 (pulse Ox < 90%) on room air.  PSI/PORT score is 161!  Additionally patient  is having NSTEMI though suspect this might be type 2 (demand ischemia).  * I certify that at the point of admission it is my clinical judgment that the patient will require inpatient hospital care spanning beyond 2 midnights from the point of admission due to high intensity of service, high risk for further deterioration and high frequency of surveillance required.*   Kolette Vey M. DO [facility]  How to contact the [facility] Attending or Consulting provider [place] or covering provider during after hours [place], for this patient?  Check the care team in [facility] and look for a) attending/consulting [facility] provider listed and b) the [facility] team listed Log into [email or web address]  Amion Physician Scheduling and messaging for groups and whole hospitals  On call and physician scheduling software for group practices, residents, hospitalists and other medical providers for call, clinic, rotation and shift schedules. On[employer] is a hospital-wide system for scheduling doctors and paging doctors on call. EasyPlot is for scientific plotting and data analysis.  [email or web address]  and use [place]'s universal password to access. If you do not have the password, please contact the hospital operator.  Locate the [facility] provider you are  looking for under [facility] and page to a number that you can be directly reached. If you still have difficulty reaching the provider, please page the [facility] (Director on Call) for the Hospitalists listed on amion for assistance.  11/19/2020, 1:50 AM

## 2020-11-19 NOTE — ED Notes (Signed)
RN noticed multiple PVCs, EKG captured. MD [place] paged about changes with EKG.

## 2020-11-20 ENCOUNTER — Other Ambulatory Visit (HOSPITAL_COMMUNITY): Payer: Medicare Other

## 2020-11-20 ENCOUNTER — Encounter (HOSPITAL_COMMUNITY): Payer: Self-pay | Admitting: Internal Medicine

## 2020-11-20 DIAGNOSIS — N179 Acute kidney failure, unspecified: Secondary | ICD-10-CM | POA: Diagnosis not present

## 2020-11-20 DIAGNOSIS — I214 Non-ST elevation (NSTEMI) myocardial infarction: Secondary | ICD-10-CM | POA: Diagnosis not present

## 2020-11-20 DIAGNOSIS — J9601 Acute respiratory failure with hypoxia: Secondary | ICD-10-CM | POA: Diagnosis not present

## 2020-11-20 DIAGNOSIS — J189 Pneumonia, unspecified organism: Secondary | ICD-10-CM | POA: Diagnosis not present

## 2020-11-20 LAB — URINE CULTURE

## 2020-11-20 LAB — GLUCOSE, CAPILLARY
Glucose-Capillary: 155 mg/dL — ABNORMAL HIGH (ref 70–99)
Glucose-Capillary: 187 mg/dL — ABNORMAL HIGH (ref 70–99)
Glucose-Capillary: 204 mg/dL — ABNORMAL HIGH (ref 70–99)
Glucose-Capillary: 209 mg/dL — ABNORMAL HIGH (ref 70–99)
Glucose-Capillary: 236 mg/dL — ABNORMAL HIGH (ref 70–99)
Glucose-Capillary: 91 mg/dL (ref 70–99)
Glucose-Capillary: 99 mg/dL (ref 70–99)

## 2020-11-20 LAB — COMPREHENSIVE METABOLIC PANEL
ALT: 113 U/L — ABNORMAL HIGH (ref 0–44)
AST: 95 U/L — ABNORMAL HIGH (ref 15–41)
Albumin: 2.3 g/dL — ABNORMAL LOW (ref 3.5–5.0)
Alkaline Phosphatase: 84 U/L (ref 38–126)
Anion gap: 11 (ref 5–15)
BUN: 35 mg/dL — ABNORMAL HIGH (ref 8–23)
CO2: 23 mmol/L (ref 22–32)
Calcium: 8.5 mg/dL — ABNORMAL LOW (ref 8.9–10.3)
Chloride: 98 mmol/L (ref 98–111)
Creatinine, Ser: 1.63 mg/dL — ABNORMAL HIGH (ref 0.61–1.24)
GFR, Estimated: 40 mL/min — ABNORMAL LOW (ref >60)
Glucose, Bld: 133 mg/dL — ABNORMAL HIGH (ref 70–99)
Potassium: 4.2 mmol/L (ref 3.5–5.1)
Sodium: 132 mmol/L — ABNORMAL LOW (ref 135–145)
Total Bilirubin: 0.4 mg/dL (ref 0.3–1.2)
Total Protein: 6.3 g/dL — ABNORMAL LOW (ref 6.5–8.1)

## 2020-11-20 LAB — CBC
HCT: 27.2 % — ABNORMAL LOW (ref 39.0–52.0)
Hemoglobin: 8.8 g/dL — ABNORMAL LOW (ref 13.0–17.0)
MCH: 33.7 pg (ref 26.0–34.0)
MCHC: 32.4 g/dL (ref 30.0–36.0)
MCV: 104.2 fL — ABNORMAL HIGH (ref 80.0–100.0)
Platelets: 178 10*3/uL (ref 150–400)
RBC: 2.61 MIL/uL — ABNORMAL LOW (ref 4.22–5.81)
RDW: 13.8 % (ref 11.5–15.5)
WBC: 10.2 10*3/uL (ref 4.0–10.5)
nRBC: 0 % (ref 0.0–0.2)

## 2020-11-20 LAB — TROPONIN I (HIGH SENSITIVITY): Troponin I (High Sensitivity): 3818 ng/L (ref <18)

## 2020-11-20 LAB — BRAIN NATRIURETIC PEPTIDE: B Natriuretic Peptide: 1701.7 pg/mL — ABNORMAL HIGH (ref 0.0–100.0)

## 2020-11-20 MED ORDER — FUROSEMIDE 20 MG PO TABS
20.0000 mg | ORAL_TABLET | Freq: Every day | ORAL | Status: DC
  Administered 2020-11-20: 20 mg via ORAL
  Filled 2020-11-20: qty 1

## 2020-11-20 NOTE — Plan of Care (Signed)
  Problem: Education: Goal: Knowledge of General Education information will improve Description Including pain rating scale, medication(s)/side effects and non-pharmacologic comfort measures Outcome: Progressing   Problem: Health Behavior/Discharge Planning: Goal: Ability to manage health-related needs will improve Outcome: Progressing   

## 2020-11-20 NOTE — Progress Notes (Signed)
RT note. Patient currently not requiring bipap. Patient on 15L [place] sat 98% with no labored breathing noted at this time. Bipap is in patients room if needed later. RN aware, RT will continue to monitor.

## 2020-11-20 NOTE — Progress Notes (Signed)
Pt placed on BiPAP and resting comfortably. Rt will continue to monitor.

## 2020-11-20 NOTE — Progress Notes (Addendum)
   11/20/20 0000  Vitals  Temp 98.9 F (37.2 C)  Temp Source Oral  BP (!) 160/72  MAP (mmHg) 96  BP Location Right Arm  BP Method Automatic  Patient Position (if appropriate) Lying  Pulse Rate 98  ECG Heart Rate 97  Resp (!) 21  Level of Consciousness  Level of Consciousness Alert  MEWS COLOR  MEWS Score Color Green  Oxygen Therapy  SpO2 92 %  O2 Device HFNC  O2 Flow Rate (L/min) 15 L/min  MEWS Score  MEWS Temp 0  MEWS Systolic 0  MEWS Pulse 0  MEWS RR 1  MEWS LOC 0  MEWS Score 1   The patient sat was 83 %  on 9 L HFNC. He was having dyspnea at rest and he stated he couldn't breath . I pumped his 02 to 15 L HFNC. I consulted RT and Dr. Hal Hope. See epic for multiple orders received from Dr. Renetta Chalk. Orders already implemented. Patent is on BIPAP now and he appears comfortable 02 sat is 97 % on BIPAP.  ST elvation reported by CCMD. Dr. Hal Hope notified and he ordered  EKG. Will continue to monitor.

## 2020-11-20 NOTE — Progress Notes (Signed)
[facility]  PROGRESS NOTE  Tommy [name] ACZ:[id number] DOB: [birth year] DOA: 11/09/2020 PCP: Pleas Koch, NP   Brief HPI:   87 year old male with medical history of CAD s/p CABG, carotid artery disease s/p CEA, AAA(3.6 cm in July), hypertension, diabetes mellitus type 2, prior pulmonary embolism not on anticoagulation, history of HIT, CKD stage IIIb presented to ED with complaints of generalized weakness, intermittent chest pain.  Patient states that he went to doctor's appointment for a troponin injection due to anemia of CKD after that he developed nausea and vomiting.  This was associated with generalized weakness, worsening shortness of breath and dyspnea on exertion. EMS was called he was found to have O2 sats in upper 80s which improved on 2 L via nasal cannula.  Also patient had fever with temperature 101.9.  Chest x-ray showed right lower lobe pneumonia.  Patient started on Rocephin and Zithromax.    Subjective   Patient will be worsening of shortness of breath last night, requiring 15 L high flow nasal cannula.  IV fluids were discontinued, patient placed on BiPAP and received 1 dose of Lasix 20 mg IV.  He diuresed well with Lasix and breathing has significantly improved.   Assessment/Plan:     Right lower lobe community-acquired pneumonia -Started on pneumonia pathway -Continue Rocephin and Zithromax -Strep pneumo and Legionella urinary antigen obtained -COVID-19 and influenza negative   Acute hypoxemic respiratory failure -Multifactorial from underlying pneumonia as well as acute on chronic diastolic CHF  -BNP elevated to 1700; diuresed well with IV Lasix 20 mg x 1 -Repeat chest x-ray this morning showed worsening right more than left airspace opacity likely infection  Acute on chronic diastolic CHF -Patient's echocardiogram from 2021 shows grade 3 diastolic dysfunction -BNP elevated to 1700 -Diuresed well with IV Lasix 20 mg x 1 given last night -We  will start low-dose Lasix 20 mg p.o. daily due to soft BP   Elevated troponin/history of CAD s/p CABG -Presented with elevated troponin 3,464 -I called and discussed with cardiologist Dr. Debara Pickett, he reviewed the chart; and recommend only medical management -Not a candidate for left heart cath given advanced age, multiple comorbidities and worsening renal function   History of pulmonary embolism -Patient is not on chronic anticoagulation -History of HIT so cannot use heparin or Lovenox for DVT prophylaxis -GFR less than 30 with AKI so Arixtra is contraindicated -Continue with SCDs for DVT prophylaxis   Diabetes mellitus type 2 -Continue Lantus 3 units subcu nightly -Continue sliding scale insulin with NovoLog   Acute kidney injury on CKD stage IIIb -Baseline creatinine around 1.75 as of 06/05/2020 -Presented with creatinine of 2.28, improved to 1.63 this morning -IV fluids are on hold due to diastolic heart failure   Anemia of chronic disease -Likely from underlying CKD stage IIIb -Gets erythropoietin every 14 days -Last dose was 2 days ago -Hemoglobin is stable at 8.3.        Data Reviewed:   CBG:  Recent Labs  Lab 11/20/20 0011 11/20/20 0549 11/20/20 0812 11/20/20 1252 11/20/20 1657  GLUCAP 155* 99 91 236* 209*    SpO2: 99 % O2 Flow Rate (L/min): 15 L/min    Vitals:   11/20/20 0809 11/20/20 1248 11/20/20 1434 11/20/20 1654  BP: 132/64 124/62  (!) 103/58  Pulse: 76 72  73  Resp: 19 20  20   Temp: 97.9 F (36.6 C) 97.8 F (36.6 C)  97.6 F (36.4 C)  TempSrc: Oral Oral  Oral  SpO2:  100% 93% 96% 99%  Weight:      Height:         Intake/Output Summary (Last 24 hours) at 11/20/2020 1836 Last data filed at 11/20/2020 1659 Gross per 24 hour  Intake 875 ml  Output 1305 ml  Net -430 ml    09/17 1901 - 09/19 0700 In: 2596.6 [I.V.:1046.6] Out: 650 [Urine:650]  Filed Weights   11/10/2020 2241 12/01/2020 2325 11/20/20 0400  Weight: 70.6 kg 70.3 kg 70.4 kg     Data Reviewed: Basic Metabolic Panel: Recent Labs  Lab 11/06/2020 2245 11/19/20 0325 11/20/20 0211  NA 133* 133* 132*  K 4.7 4.5 4.2  CL 99 101 98  CO2 22 21* 23  GLUCOSE 270* 195* 133*  BUN 44* 43* 35*  CREATININE 2.28* 2.12* 1.63*  CALCIUM 8.9 8.5* 8.5*  MG 1.9  --   --    Liver Function Tests: Recent Labs  Lab 11/17/2020 2245 11/20/20 0211  AST 87* 95*  ALT 69* 113*  ALKPHOS 82 84  BILITOT 0.5 0.4  PROT 6.6 6.3*  ALBUMIN 2.8* 2.3*   No results for input(s): LIPASE, AMYLASE in the last 168 hours. No results for input(s): AMMONIA in the last 168 hours. CBC: Recent Labs  Lab 11/17/20 1303 11/22/2020 2245 11/19/20 0325 11/20/20 0211  WBC  --  8.8 8.3 10.2  HGB 9.0* 8.5* 8.3* 8.8*  HCT 27.4* 26.4* 25.0* 27.2*  MCV  --  105.6* 105.9* 104.2*  PLT  --  176 143* 178   Cardiac Enzymes: No results for input(s): CKTOTAL, CKMB, CKMBINDEX, TROPONINI in the last 168 hours. BNP (last 3 results) Recent Labs    11/27/2020 2245 11/19/20 1036 11/20/20 0211  BNP 1,366.6* 1,421.5* 1,701.7*    ProBNP (last 3 results) No results for input(s): PROBNP in the last 8760 hours.  CBG: Recent Labs  Lab 11/20/20 0011 11/20/20 0549 11/20/20 0812 11/20/20 1252 11/20/20 1657  GLUCAP 155* 99 91 236* 209*    Recent Results (from the past 240 hour(s))  Urine Culture     Status: Abnormal   Collection Time: 11/24/2020 10:47 PM   Specimen: Urine, Clean Catch  Result Value Ref Range Status   Specimen Description URINE, CLEAN CATCH  Final   Special Requests   Final    NONE Performed at [facility], [place] [address], [place], [place] [address]    Culture MULTIPLE SPECIES PRESENT, SUGGEST RECOLLECTION (A)  Final   Report Status 11/20/2020 FINAL  Final  Resp Panel by RT-PCR (Flu A&B, Covid) Nasopharyngeal Swab     Status: None   Collection Time: 11/12/2020 11:41 PM   Specimen: Nasopharyngeal Swab; Nasopharyngeal(NP) swabs in vial transport medium  Result Value Ref Range Status    SARS Coronavirus 2 by RT PCR NEGATIVE NEGATIVE Final    Comment: (NOTE) SARS-CoV-2 target nucleic acids are NOT DETECTED.  The SARS-CoV-2 RNA is generally detectable in upper respiratory specimens during the acute phase of infection. The lowest concentration of SARS-CoV-2 viral copies this assay can detect is 138 copies/mL. A negative result does not preclude SARS-Cov-2 infection and should not be used as the sole basis for treatment or other patient management decisions. A negative result may occur with  improper specimen collection/handling, submission of specimen other than nasopharyngeal swab, presence of viral mutation(s) within the areas targeted by this assay, and inadequate number of viral copies(<138 copies/mL). A negative result must be combined with clinical observations, patient history, and epidemiological information. The expected result is  Negative.  Fact Sheet for Patients:  EntrepreneurPulse.com.au  Fact Sheet for Healthcare Providers:  IncredibleEmployment.be  This test is no t yet approved or cleared by the [place] FDA and  has been authorized for detection and/or diagnosis of SARS-CoV-2 by FDA under an Emergency Use Authorization (EUA). This EUA will remain  in effect (meaning this test can be used) for the duration of the COVID-19 declaration under Section 564(b)(1) of the Act, 21 U.S.C.section 360bbb-3(b)(1), unless the authorization is terminated  or revoked sooner.       Influenza A by PCR NEGATIVE NEGATIVE Final   Influenza B by PCR NEGATIVE NEGATIVE Final    Comment: (NOTE) The Xpert Xpress SARS-CoV-2/FLU/RSV plus assay is intended as an aid in the diagnosis of influenza from Nasopharyngeal swab specimens and should not be used as a sole basis for treatment. Nasal washings and aspirates are unacceptable for Xpert Xpress SARS-CoV-2/FLU/RSV testing.  Fact Sheet for  Patients: EntrepreneurPulse.com.au  Fact Sheet for Healthcare Providers: IncredibleEmployment.be  This test is not yet approved or cleared by the [place] FDA and has been authorized for detection and/or diagnosis of SARS-CoV-2 by FDA under an Emergency Use Authorization (EUA). This EUA will remain in effect (meaning this test can be used) for the duration of the COVID-19 declaration under Section 564(b)(1) of the Act, 21 U.S.C. section 360bbb-3(b)(1), unless the authorization is terminated or revoked.  Performed at [facility], [place] [address], [place], [place] [address]   Blood Culture (routine x 2)     Status: None (Preliminary result)   Collection Time: 11/24/2020 11:57 PM   Specimen: BLOOD  Result Value Ref Range Status   Specimen Description BLOOD RIGHT ANTECUBITAL  Final   Special Requests   Final    BOTTLES DRAWN AEROBIC AND ANAEROBIC Blood Culture results may not be optimal due to an inadequate volume of blood received in culture bottles   Culture   Final    NO GROWTH 1 DAY Performed at [facility], [place] [address], [place], [place] [address]    Report Status PENDING  Incomplete  Respiratory (~20 pathogens) panel by PCR     Status: None   Collection Time: 11/19/20  7:10 AM   Specimen: Nasopharyngeal Swab; Respiratory  Result Value Ref Range Status   Adenovirus NOT DETECTED NOT DETECTED Final   Coronavirus 229E NOT DETECTED NOT DETECTED Final    Comment: (NOTE) The Coronavirus on the Respiratory Panel, DOES NOT test for the novel  Coronavirus (2019 nCoV)    Coronavirus HKU1 NOT DETECTED NOT DETECTED Final   Coronavirus NL63 NOT DETECTED NOT DETECTED Final   Coronavirus OC43 NOT DETECTED NOT DETECTED Final   Metapneumovirus NOT DETECTED NOT DETECTED Final   Rhinovirus / Enterovirus NOT DETECTED NOT DETECTED Final   Influenza A NOT DETECTED NOT DETECTED Final   Influenza B NOT DETECTED NOT DETECTED Final    Parainfluenza Virus 1 NOT DETECTED NOT DETECTED Final   Parainfluenza Virus 2 NOT DETECTED NOT DETECTED Final   Parainfluenza Virus 3 NOT DETECTED NOT DETECTED Final   Parainfluenza Virus 4 NOT DETECTED NOT DETECTED Final   Respiratory Syncytial Virus NOT DETECTED NOT DETECTED Final   Bordetella pertussis NOT DETECTED NOT DETECTED Final   Bordetella Parapertussis NOT DETECTED NOT DETECTED Final   Chlamydophila pneumoniae NOT DETECTED NOT DETECTED Final   Mycoplasma pneumoniae NOT DETECTED NOT DETECTED Final    Comment: Performed at [facility], [place], [place], [place] [address]  Radiology Reports  DG CHEST PORT 1 VIEW  Result Date: 11/20/2020 CLINICAL DATA:  Shortness of breath EXAM: PORTABLE CHEST 1 VIEW COMPARISON:  11/19/2020 FINDINGS: Mild worsening right-greater-than-left airspace opacities. Small pleural effusions. Unchanged cardiomediastinal contours with remote median sternotomy. IMPRESSION: Worsening right-greater-than-left airspace opacities, likely infection. Electronically Signed   By: Ulyses Jarred M.D.   On: 11/20/2020 00:25   DG Chest Portable 1 View  Result Date: 11/19/2020 CLINICAL DATA:  Shortness of breath, chest pain EXAM: PORTABLE CHEST 1 VIEW COMPARISON:  06/02/2020 FINDINGS: Unchanged cardiac and mediastinal contours. Airspace opacity in the right lower lung, most prominent in the infrahilar region. No pleural effusion or pneumothorax. No acute osseous abnormality. Status post median sternotomy with surgical clips overlying the cardiac silhouette. IMPRESSION: Opacities in the right lower lung, concerning for infection in the appropriate clinical setting. Electronically Signed   By: Merilyn Baba M.D.   On: 11/19/2020 00:05   DG Chest Port 1V same Day  Result Date: 11/19/2020 CLINICAL DATA:  Chest pain, shortness of breath EXAM: PORTABLE CHEST 1 VIEW COMPARISON:  Chest radiograph 11/27/2020 FINDINGS: The cardiomediastinal silhouette is stable.  Confluent opacity is again seen in the right lower lobe, slightly worsened in the interim. There is a trace right pleural effusion, unchanged. There are patchy opacities in the left base. There is no significant left effusion. There is no pneumothorax. There is no acute osseous abnormality. IMPRESSION: Increasing patchy opacity in the right lower lobe concerning for worsening infection. Patchy opacities in the left base may reflect atelectasis or an additional focus of infection. Recommend follow-up radiographs in 6-8 weeks to ensure resolution. Electronically Signed   By: Valetta Mole M.D.   On: 11/19/2020 11:02     Scheduled medications:    aspirin EC  325 mg Oral QHS   atorvastatin  40 mg Oral QHS   ferrous sulfate  325 mg Oral QHS   ferrous sulfate  650 mg Oral Daily   furosemide  20 mg Oral Daily   influenza vaccine adjuvanted  0.5 mL Intramuscular Tomorrow-1000   insulin aspart  0-9 Units Subcutaneous Q4H   insulin glargine-yfgn  3 Units Subcutaneous QHS   ipratropium-albuterol  3 mL Nebulization Q6H   levothyroxine  175 mcg Oral Q0600   multivitamin with minerals  1 tablet Oral Daily   pantoprazole  20 mg Oral Daily   ranolazine  1,000 mg Oral BID   tamsulosin  0.4 mg Oral QPC supper   timolol  1 drop Both Eyes Daily    Antibiotics: Anti-infectives (From admission, onward)    Start     Dose/Rate Route Frequency Ordered Stop   11/20/20 0000  azithromycin (ZITHROMAX) 500 mg in sodium chloride 0.9 % 250 mL IVPB        500 mg 250 mL/hr over 60 Minutes Intravenous Every 24 hours 11/19/20 0149 11/24/20 2359   11/19/20 2200  cefTRIAXone (ROCEPHIN) 2 g in sodium chloride 0.9 % 100 mL IVPB        2 g 200 mL/hr over 30 Minutes Intravenous Every 24 hours 11/19/20 0149 11/24/20 2159   11/19/20 0200  cefTRIAXone (ROCEPHIN) 1 g in sodium chloride 0.9 % 100 mL IVPB        1 g 200 mL/hr over 30 Minutes Intravenous  Once 11/19/20 0154 11/19/20 0306   11/19/20 0000  cefTRIAXone (ROCEPHIN) 1 g  in sodium chloride 0.9 % 100 mL IVPB        1 g 200 mL/hr over 30 Minutes  Intravenous  Once 11/04/2020 2350 11/19/20 0201   11/19/20 0000  azithromycin (ZITHROMAX) 500 mg in sodium chloride 0.9 % 250 mL IVPB        500 mg 250 mL/hr over 60 Minutes Intravenous  Once 11/06/2020 2350 11/19/20 0306         DVT prophylaxis: SCDs  Code Status: DNR  Family Communication: No family at bedside   Consultants:   Procedures:     Objective    Physical Examination:   General-appears in no acute distress Heart-S1-S2, regular, no murmur auscultated Lungs-bilateral rhonchi auscultated Abdomen-soft, nontender, no organomegaly Extremities-no edema in the lower extremities Neuro-alert, oriented x3, no focal deficit noted  Status is: Inpatient  Dispo: The patient is from: Home              Anticipated d/c is to: Home versus skilled nursing facility              Anticipated d/c date is: 11/22/2020              Patient currently not stable for discharge  Barrier to discharge-ongoing treatment for acute hypoxemic respiratory failure  COVID-19 Labs  No results for input(s): DDIMER, FERRITIN, LDH, CRP in the last 72 hours.  Lab Results  Component Value Date   SARSCOV2NAA NEGATIVE 11/27/2020   [place] NEGATIVE 06/02/2020   [place] NEGATIVE 02/13/2020   [place] Not Detected 06/08/2019         Oswald Hillock   [facility] If 7PM-7AM, please contact night-coverage at [email or web address], Office  [phone]   11/20/2020, 6:36 PM  LOS: 1 day

## 2020-11-21 DIAGNOSIS — J9601 Acute respiratory failure with hypoxia: Secondary | ICD-10-CM | POA: Diagnosis not present

## 2020-11-21 DIAGNOSIS — J189 Pneumonia, unspecified organism: Secondary | ICD-10-CM | POA: Diagnosis not present

## 2020-11-21 LAB — LEGIONELLA PNEUMOPHILA SEROGP 1 UR AG: L. pneumophila Serogp 1 Ur Ag: NEGATIVE

## 2020-11-22 ENCOUNTER — Ambulatory Visit: Payer: Medicare Other | Admitting: Internal Medicine

## 2020-11-27 LAB — CULTURE, BLOOD (ROUTINE X 2): Culture: NO GROWTH

## 2020-12-02 NOTE — Progress Notes (Signed)
RT note. Patient placed on bipap. Current settings 14/10 100% R12. Patient currently sat between 86-89%. RN aware, RT will continue to monitor.

## 2020-12-02 NOTE — Discharge Summary (Signed)
Death Summary  Tommy [name] XAJ:[id number] DOB: 11-01-1926 DOA: 12-16-20  PCP: Pleas Koch, NP PCP/Office notified:   Admit date: 16-Dec-2020 Date of Death: 2020-12-19  Final Diagnoses:  Principal Problem:   Community acquired pneumonia of right lower lobe of lung Active Problems:   Coronary artery disease of native artery of native heart with stable angina pectoris (HCC)   Type 2 diabetes mellitus (HCC)   Acute kidney injury superimposed on CKD 3b (HCC)   Elevated troponin   Acute respiratory failure with hypoxia (HCC)   Ischemic chest pain (HCC)   NSTEMI (non-ST elevated myocardial infarction) [facility])      History of present illness:   87 year old male with medical history of CAD s/p CABG, carotid artery disease s/p CEA, AAA(3.6 cm in July), hypertension, diabetes mellitus type 2, prior pulmonary embolism not on anticoagulation, history of HIT, CKD stage IIIb presented to ED with complaints of generalized weakness, intermittent chest pain.  Patient states that he went to doctor's appointment for a troponin injection due to anemia of CKD after that he developed nausea and vomiting.  This was associated with generalized weakness, worsening shortness of breath and dyspnea on exertion. EMS was called he was found to have O2 sats in upper 80s which improved on 2 L via nasal cannula.  Also patient had fever with temperature 101.9.  Chest x-ray showed right lower lobe pneumonia.  Patient started on Rocephin and Zithromax.  Hospital Course:     Acute hypoxemic respiratory failure -Multifactorial from underlying pneumonia as well as acute on chronic diastolic CHF  -BNP elevated to 1700; diuresed well with IV Lasix 20 mg x 1 -Repeat chest x-ray this morning showed worsening right more than left airspace opacity likely infection -Patient was found to be unresponsive, he became bradycardic with hypotension and patient expired at 2:34 AM AM.    Right lower lobe community-acquired  pneumonia -Started on pneumonia pathway with Rocephin and Zithromax -COVID-19 and influenza negative   Elevated troponin/history of CAD s/p CABG -Presented with elevated troponin 3,464 -I called and discussed with cardiologist Dr. Debara Pickett, he reviewed the chart; and recommend only medical management -Not a candidate for left heart cath given advanced age, multiple comorbidities and worsening renal function   History of pulmonary embolism -Patient is not on chronic anticoagulation -History of HIT so cannot use heparin or Lovenox for DVT prophylaxis -GFR less than 30 with AKI so Arixtra was contraindicated -Continued with SCDs for DVT prophylaxis        Time: Of death 2:34 AM  Signed:  Oswald Hillock  [facility] 12-19-20, 7:13 PM

## 2020-12-02 NOTE — Progress Notes (Signed)
Notice patient's o2 sat was 79, went into room and saw patient turned on his side and down in the bed mouth breathing with hi flo nasal cannula in his nose. Patient was unresponsive with eyes slightly open. Charge nurse then notified of patients status and notified for assistance with patient. Patient was pulled up in bed and 02 sats increased slightly. Patient sats increased to the lower 80s and patient still remained unresponsive. Patient then was placed on bipap. Sats increased and the highest 02 that was seen on the monitor was 90. Patient also became a little more alert. Patient sats then decreased again and heart rate decreased as well. MD (A.Kakrakandy) notified and he stated he will come to see patient and notify family. Patients heart rate slowly continued to decrease in the 40s and 30s and blood pressure decreased as well. MD came to bedside and patient's heart rate was in the 20s. Pulse was checked and it was faint. Patient then took his last breath while in the room with nurse, RT, and MD. MD notified son, post mortem care performed by nurse techs, donor line called([place]), and charge nurse called [facility], funeral home, and son. Patient placement called and spoke with nurse.

## 2020-12-02 DEATH — deceased

## 2020-12-22 ENCOUNTER — Inpatient Hospital Stay: Payer: Medicare Other

## 2021-01-19 ENCOUNTER — Other Ambulatory Visit: Payer: Medicare Other

## 2021-01-19 ENCOUNTER — Ambulatory Visit: Payer: Medicare Other

## 2021-02-14 ENCOUNTER — Ambulatory Visit: Payer: Medicare Other | Admitting: Oncology

## 2021-02-15 ENCOUNTER — Ambulatory Visit: Payer: Medicare Other | Admitting: Urology

## 2021-04-10 ENCOUNTER — Encounter: Payer: Medicare Other | Admitting: Primary Care

## 2021-04-12 ENCOUNTER — Ambulatory Visit: Payer: Medicare Other

## 2021-04-13 ENCOUNTER — Encounter: Payer: Medicare Other | Admitting: Primary Care

## 2021-05-17 ENCOUNTER — Encounter: Payer: Medicare Other | Admitting: Dermatology

## 2022-01-01 IMAGING — DX DG CHEST 1V PORT
1 series · 1 of 1 positions shown · non-contrast
Comparison: February 13, 2020.

CLINICAL DATA: Cough.

EXAM:
PORTABLE CHEST 1 VIEW

[chest ap]
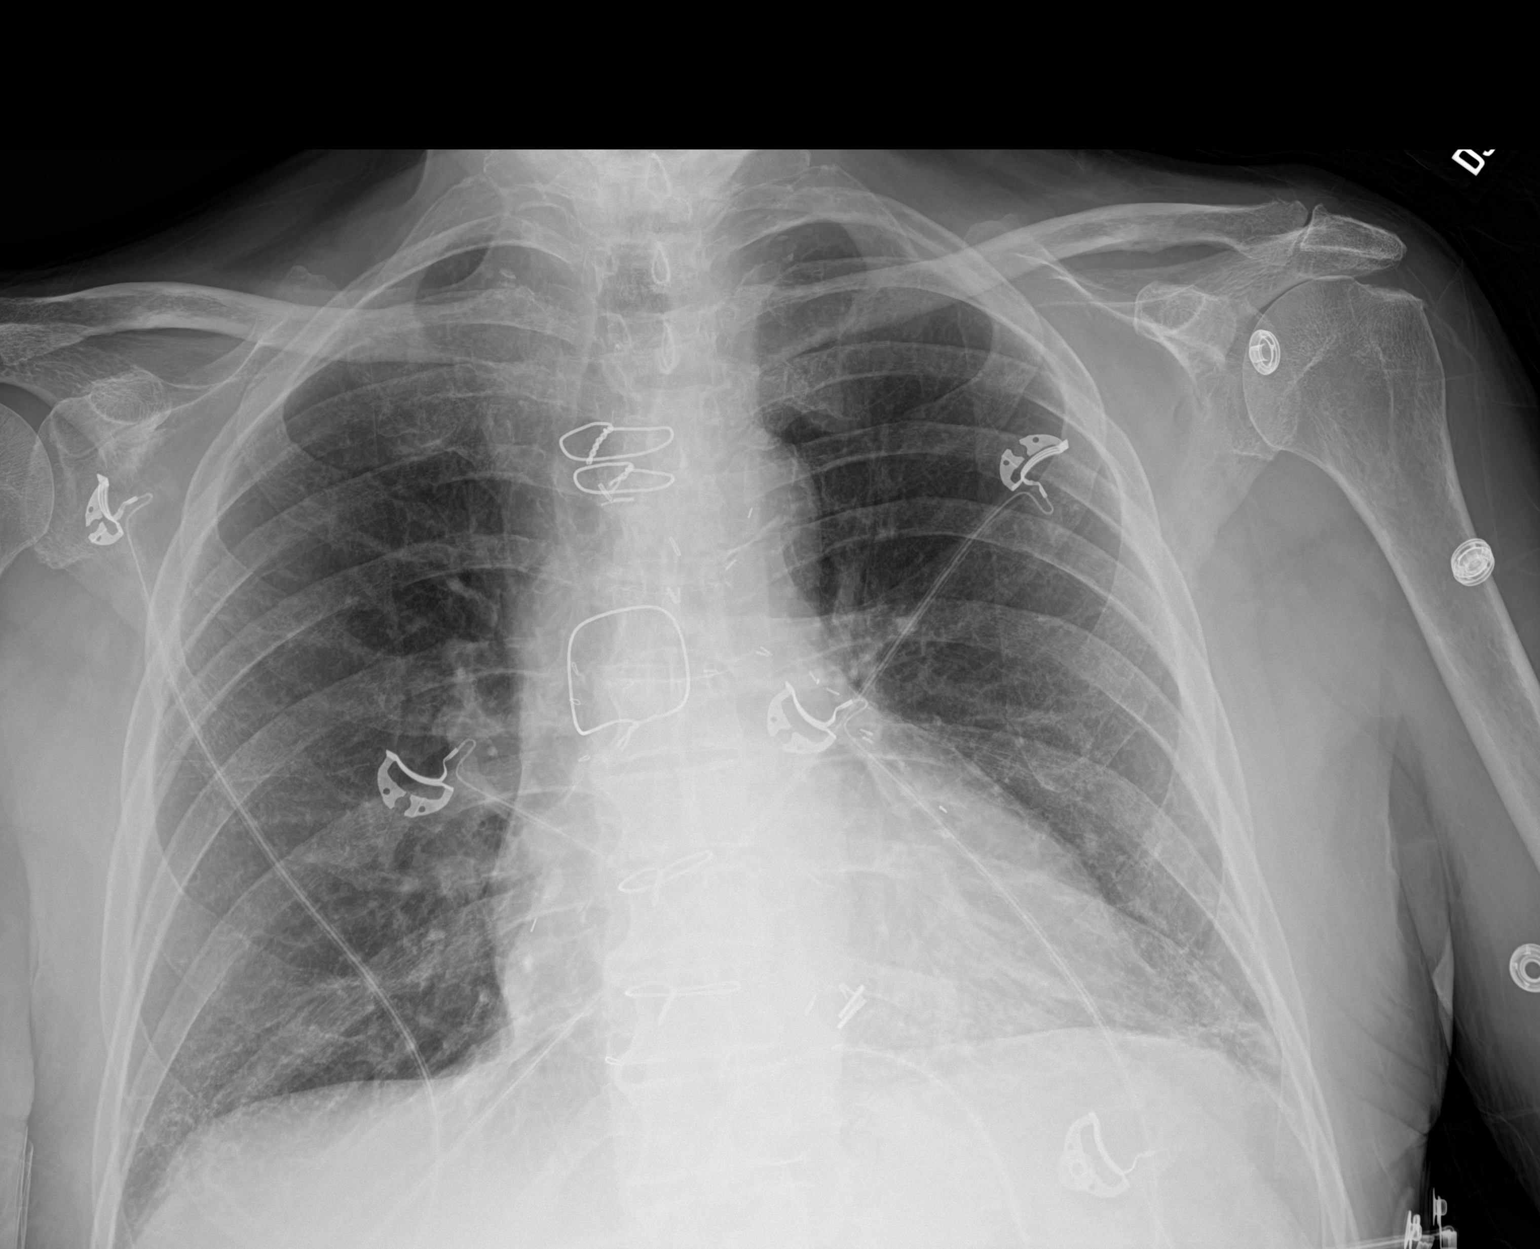

[1 of 1 positions shown; findings below may reference images not displayed]

FINDINGS: The heart size and mediastinal contours are within normal limits. No
pneumothorax or pleural effusion is noted. Right lung is clear.
Minimal left basilar subsegmental atelectasis is noted. The
visualized skeletal structures are unremarkable.
IMPRESSION: Minimal left basilar subsegmental atelectasis.

## 2022-01-01 IMAGING — CT CT HEAD W/O CM
4 series · 17 of 47 positions shown, 19 images · non-contrast
Comparison: February 13, 2020.

CLINICAL DATA: Head injury after fall.

EXAM:
CT HEAD WITHOUT CONTRAST
TECHNIQUE: Contiguous axial images were obtained from the base of the skull
through the vertex without intravenous contrast.

[Series 3: head wo · axial · 0.45mm/px · z∈[+1160,+1290]mm · 7 of 36 slices shown, 9 images]
[im 5/36  brain]
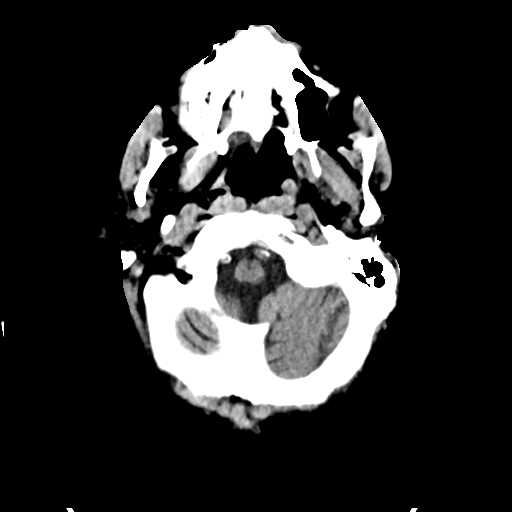
[im 5/36  bone]
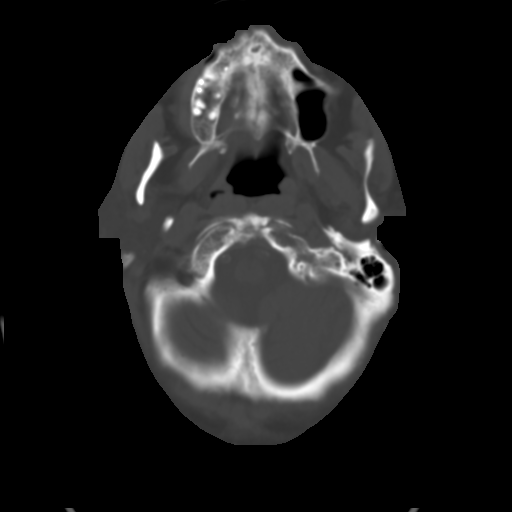
[im 9/36  brain]
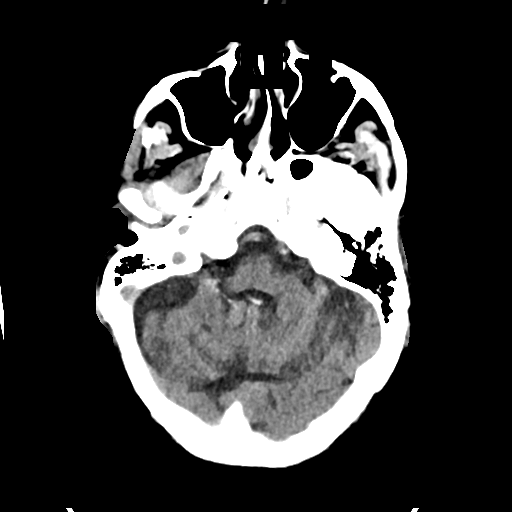
[im 14/36  brain]
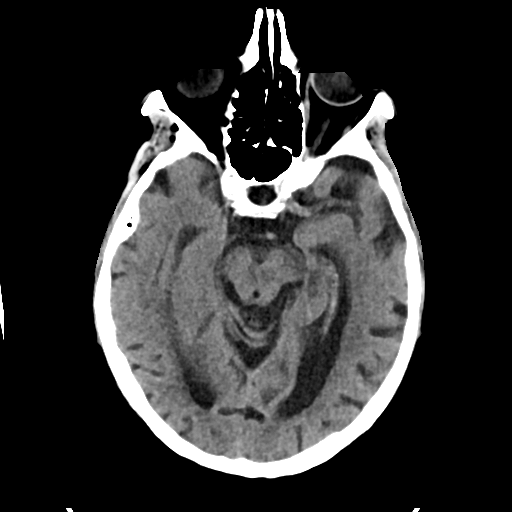
[im 18/36  brain]
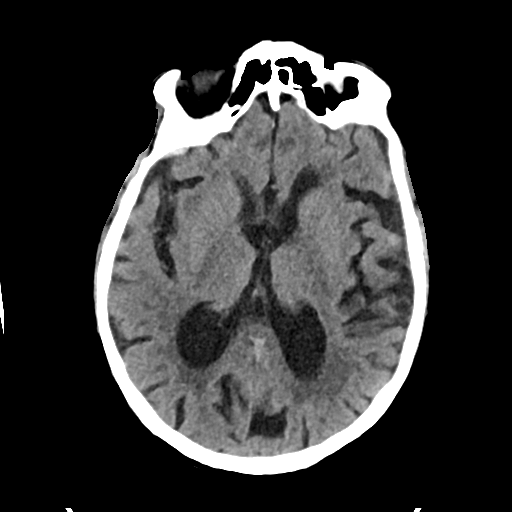
[im 22/36  brain]
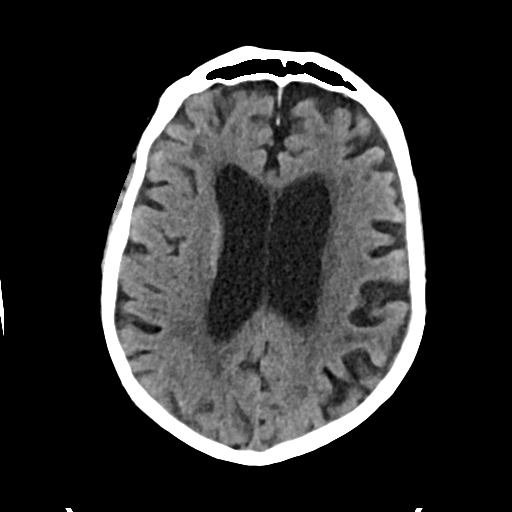
[im 22/36  bone]
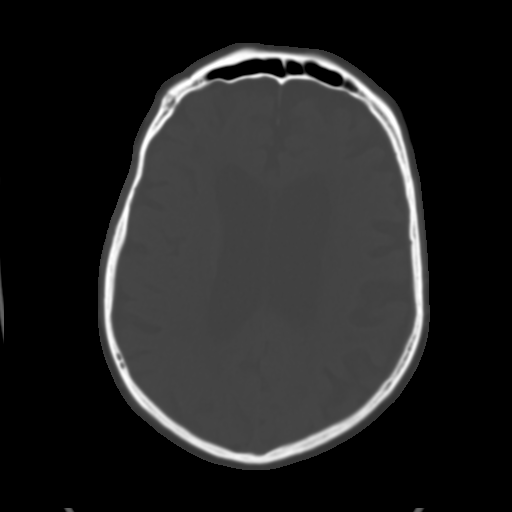
[im 27/36  brain]
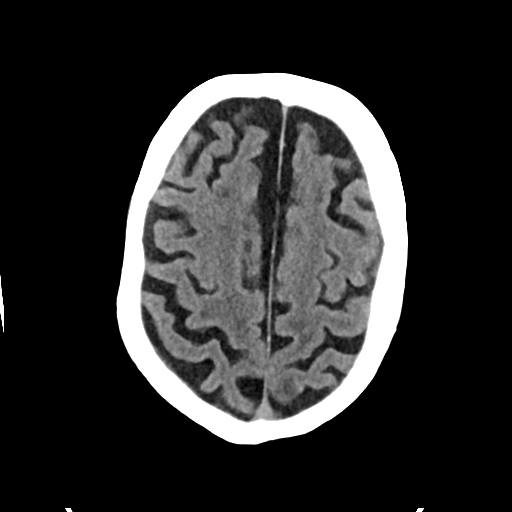
[im 31/36  brain]
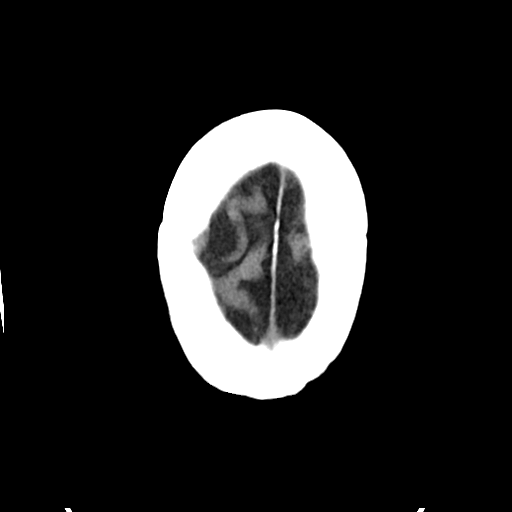

[Series 4: head bone · axial · 0.45mm/px · z∈[+1156,+1218]mm · 4 of 89 slices shown]
[im 9/89  bone]
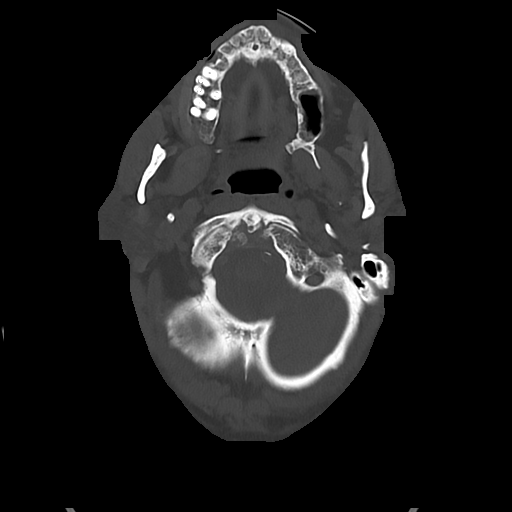
[im 18/89  bone]
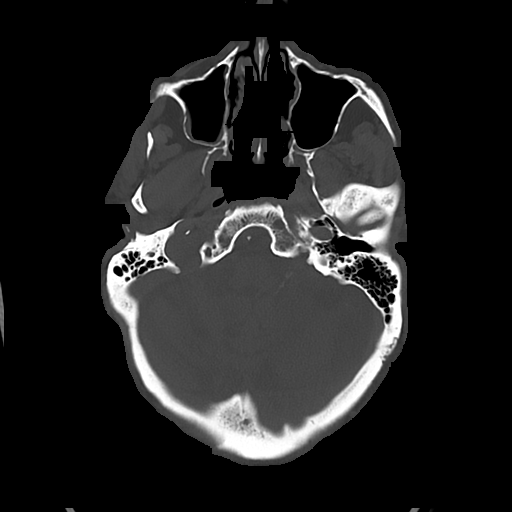
[im 27/89  bone]
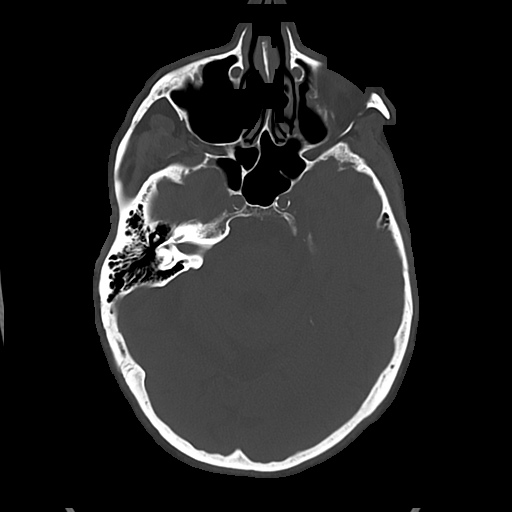
[im 40/89  bone]
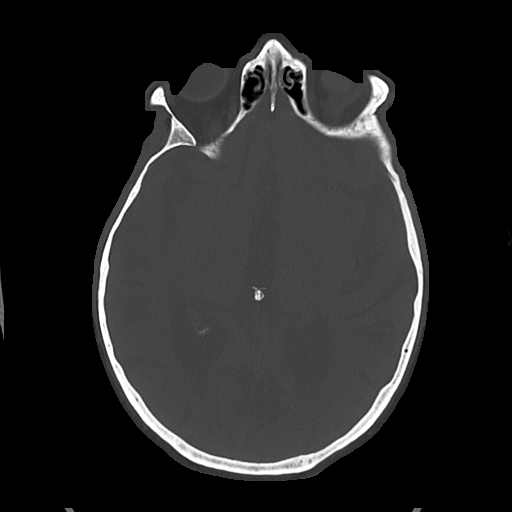

[Series 5: cor soft · coronal · 0.33mm/px · 3 of 73 slices shown]
[im 25/73  brain]
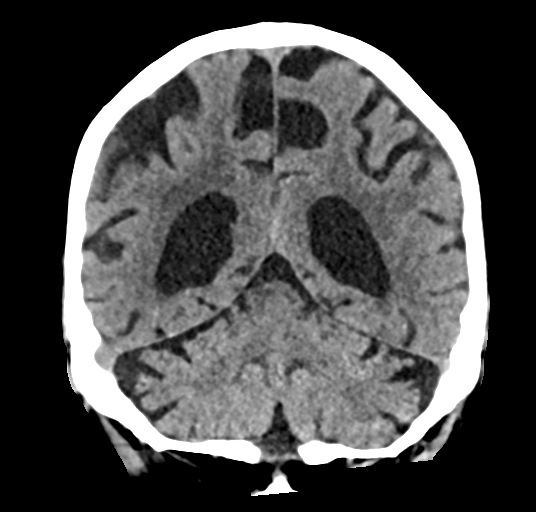
[im 33/73  brain]
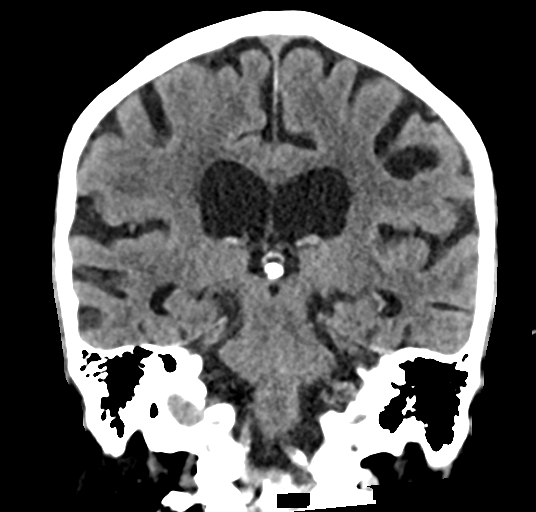
[im 41/73  brain]
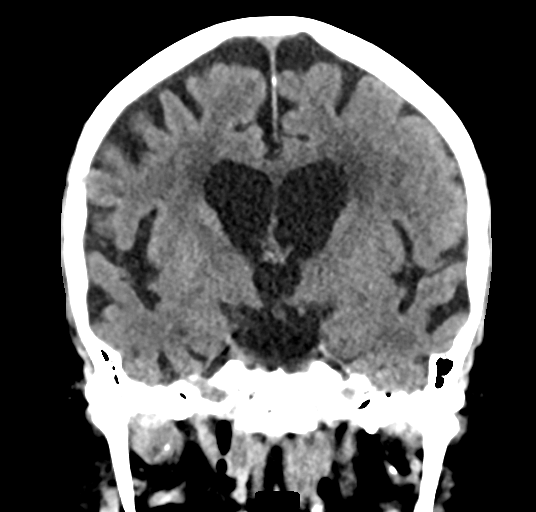

[Series 6: sag soft · sagittal · 0.34mm/px · 3 of 59 slices shown]
[im 20/59  brain]
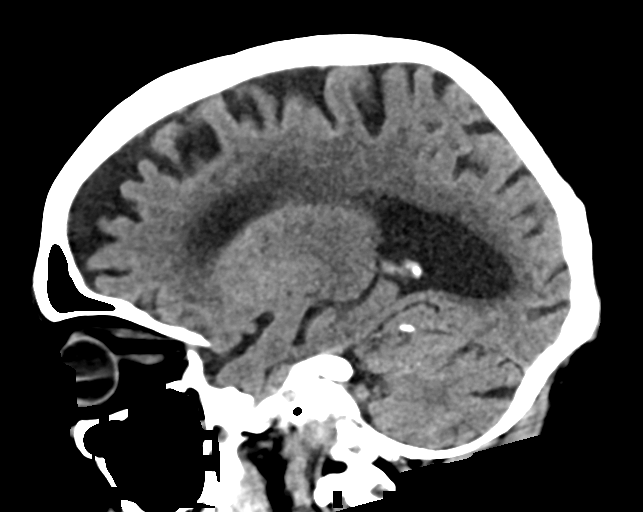
[im 30/59  brain]
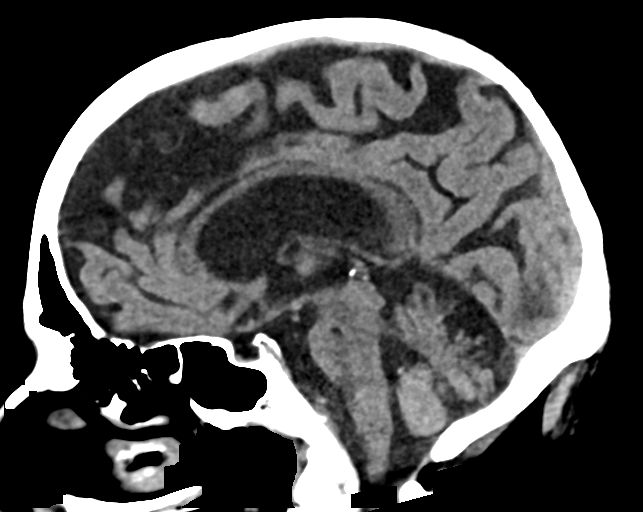
[im 39/59  brain]
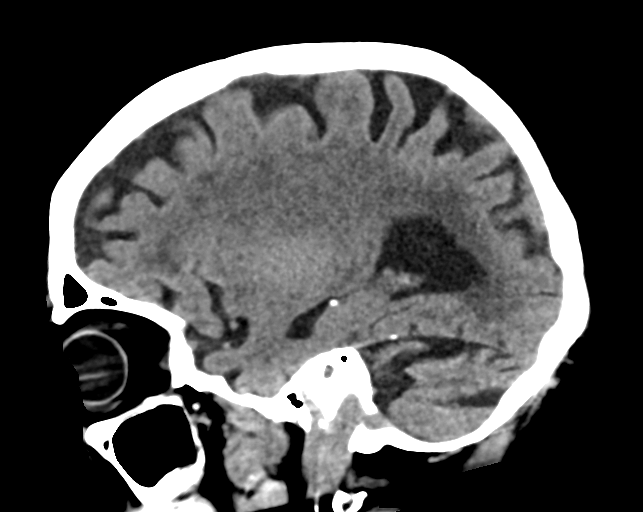

[17 of 47 positions shown; findings below may reference images not displayed]

FINDINGS: Brain: Moderate diffuse cortical atrophy is noted. Mild chronic
ischemic white matter disease is noted. No mass effect or midline
shift is noted. Ventricular size is within normal limits. There is
no evidence of mass lesion, hemorrhage or acute infarction.

Vascular: No hyperdense vessel or unexpected calcification.

Skull: Normal. Negative for fracture or focal lesion.

Sinuses/Orbits: No acute finding.

Other: None.
IMPRESSION: Moderate diffuse cortical atrophy. Mild chronic ischemic white
matter disease. No acute intracranial abnormality seen.
